# Patient Record
Sex: Female | Born: 1975 | Race: Black or African American | Hispanic: No | Smoking: Former smoker
Health system: Southern US, Community
[De-identification: ages and names within clinical notes are randomized; demographics above are authoritative.]

## PROBLEM LIST (undated history)

## (undated) DIAGNOSIS — G43909 Migraine, unspecified, not intractable, without status migrainosus: Secondary | ICD-10-CM

## (undated) DIAGNOSIS — Z9884 Bariatric surgery status: Secondary | ICD-10-CM

## (undated) DIAGNOSIS — F4310 Post-traumatic stress disorder, unspecified: Secondary | ICD-10-CM

## (undated) DIAGNOSIS — F419 Anxiety disorder, unspecified: Secondary | ICD-10-CM

## (undated) DIAGNOSIS — M47812 Spondylosis without myelopathy or radiculopathy, cervical region: Secondary | ICD-10-CM

## (undated) DIAGNOSIS — M4802 Spinal stenosis, cervical region: Secondary | ICD-10-CM

## (undated) DIAGNOSIS — E60 Dietary zinc deficiency: Secondary | ICD-10-CM

## (undated) DIAGNOSIS — F319 Bipolar disorder, unspecified: Secondary | ICD-10-CM

## (undated) DIAGNOSIS — F329 Major depressive disorder, single episode, unspecified: Secondary | ICD-10-CM

## (undated) DIAGNOSIS — D649 Anemia, unspecified: Secondary | ICD-10-CM

## (undated) DIAGNOSIS — Z9889 Other specified postprocedural states: Secondary | ICD-10-CM

## (undated) DIAGNOSIS — K649 Unspecified hemorrhoids: Secondary | ICD-10-CM

## (undated) DIAGNOSIS — G8929 Other chronic pain: Secondary | ICD-10-CM

## (undated) DIAGNOSIS — F32A Depression, unspecified: Secondary | ICD-10-CM

## (undated) DIAGNOSIS — K219 Gastro-esophageal reflux disease without esophagitis: Secondary | ICD-10-CM

## (undated) DIAGNOSIS — E560 Deficiency of vitamin E: Secondary | ICD-10-CM

## (undated) DIAGNOSIS — R112 Nausea with vomiting, unspecified: Secondary | ICD-10-CM

## (undated) DIAGNOSIS — J302 Other seasonal allergic rhinitis: Secondary | ICD-10-CM

## (undated) DIAGNOSIS — E538 Deficiency of other specified B group vitamins: Secondary | ICD-10-CM

## (undated) HISTORY — DX: Anxiety disorder, unspecified: F41.9

## (undated) HISTORY — PX: HAND SURGERY: SHX662

## (undated) HISTORY — DX: Nausea with vomiting, unspecified: R11.2

## (undated) HISTORY — DX: Post-traumatic stress disorder, unspecified: F43.10

## (undated) HISTORY — DX: Deficiency of vitamin E: E56.0

## (undated) HISTORY — DX: Migraine, unspecified, not intractable, without status migrainosus: G43.909

## (undated) HISTORY — PX: BREAST LUMPECTOMY: SHX2

## (undated) HISTORY — DX: Other specified postprocedural states: Z98.890

## (undated) HISTORY — DX: Spinal stenosis, cervical region: M48.02

## (undated) HISTORY — PX: ABDOMINAL HYSTERECTOMY: SHX81

## (undated) HISTORY — DX: Gastro-esophageal reflux disease without esophagitis: K21.9

## (undated) HISTORY — DX: Unspecified hemorrhoids: K64.9

## (undated) HISTORY — DX: Dietary zinc deficiency: E60

## (undated) HISTORY — PX: BREAST REDUCTION SURGERY: SHX8

## (undated) HISTORY — DX: Deficiency of other specified B group vitamins: E53.8

## (undated) HISTORY — DX: Anemia, unspecified: D64.9

## (undated) HISTORY — DX: Bipolar disorder, unspecified: F31.9

## (undated) HISTORY — DX: Other seasonal allergic rhinitis: J30.2

---
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
## 2001-12-08 DIAGNOSIS — Z9884 Bariatric surgery status: Secondary | ICD-10-CM

## 2001-12-08 HISTORY — PX: ROUX-EN-Y GASTRIC BYPASS: SHX1104

## 2001-12-08 HISTORY — DX: Bariatric surgery status: Z98.84

## 2005-12-05 ENCOUNTER — Inpatient Hospital Stay (HOSPITAL_COMMUNITY): Admission: AD | Admit: 2005-12-05 | Discharge: 2005-12-05 | Payer: Self-pay | Admitting: Family Medicine

## 2005-12-05 ENCOUNTER — Ambulatory Visit: Payer: Self-pay | Admitting: Certified Nurse Midwife

## 2006-11-15 ENCOUNTER — Emergency Department (HOSPITAL_COMMUNITY): Admission: EM | Admit: 2006-11-15 | Discharge: 2006-11-15 | Payer: Self-pay | Admitting: Emergency Medicine

## 2007-02-23 ENCOUNTER — Emergency Department (HOSPITAL_COMMUNITY): Admission: EM | Admit: 2007-02-23 | Discharge: 2007-02-23 | Payer: Self-pay | Admitting: Emergency Medicine

## 2008-03-04 ENCOUNTER — Emergency Department (HOSPITAL_COMMUNITY): Admission: EM | Admit: 2008-03-04 | Discharge: 2008-03-04 | Payer: Self-pay | Admitting: Emergency Medicine

## 2008-04-30 ENCOUNTER — Emergency Department (HOSPITAL_COMMUNITY): Admission: EM | Admit: 2008-04-30 | Discharge: 2008-05-01 | Payer: Self-pay | Admitting: Emergency Medicine

## 2008-09-29 ENCOUNTER — Emergency Department (HOSPITAL_COMMUNITY): Admission: EM | Admit: 2008-09-29 | Discharge: 2008-09-30 | Payer: Self-pay | Admitting: Emergency Medicine

## 2010-01-24 ENCOUNTER — Emergency Department (HOSPITAL_COMMUNITY): Admission: EM | Admit: 2010-01-24 | Discharge: 2010-01-24 | Payer: Self-pay | Admitting: Pediatric Emergency Medicine

## 2011-09-03 LAB — URINE MICROSCOPIC-ADD ON

## 2011-09-03 LAB — URINALYSIS, ROUTINE W REFLEX MICROSCOPIC
Bilirubin Urine: NEGATIVE
Glucose, UA: NEGATIVE
Hgb urine dipstick: NEGATIVE
Protein, ur: NEGATIVE
Specific Gravity, Urine: 1.015

## 2011-09-03 LAB — COMPREHENSIVE METABOLIC PANEL
ALT: 30
Albumin: 3.9
Alkaline Phosphatase: 48
CO2: 27
Calcium: 8.9
Chloride: 108
Creatinine, Ser: 0.66
Glucose, Bld: 95
Sodium: 139
Total Bilirubin: 0.6
Total Protein: 6.5

## 2011-09-03 LAB — DIFFERENTIAL
Basophils Absolute: 0.1
Eosinophils Absolute: 0.4
Lymphocytes Relative: 41
Monocytes Relative: 7
Neutrophils Relative %: 45

## 2011-09-03 LAB — CBC
MCV: 89.2
Platelets: 296
RDW: 17.6 — ABNORMAL HIGH
WBC: 6.8

## 2011-09-03 LAB — POCT PREGNANCY, URINE: Operator id: 261601

## 2012-05-06 ENCOUNTER — Other Ambulatory Visit (HOSPITAL_COMMUNITY)
Admission: RE | Admit: 2012-05-06 | Discharge: 2012-05-06 | Disposition: A | Discharge status: Home / Self Care | Payer: Medicare Other | Source: Ambulatory Visit | Attending: Obstetrics and Gynecology | Admitting: Obstetrics and Gynecology

## 2012-05-06 DIAGNOSIS — Z113 Encounter for screening for infections with a predominantly sexual mode of transmission: Secondary | ICD-10-CM | POA: Insufficient documentation

## 2012-05-06 DIAGNOSIS — Z124 Encounter for screening for malignant neoplasm of cervix: Secondary | ICD-10-CM | POA: Insufficient documentation

## 2014-04-19 DIAGNOSIS — D649 Anemia, unspecified: Secondary | ICD-10-CM | POA: Insufficient documentation

## 2015-01-13 ENCOUNTER — Other Ambulatory Visit: Payer: Self-pay

## 2015-01-13 ENCOUNTER — Emergency Department (HOSPITAL_COMMUNITY): Payer: Medicare Other

## 2015-01-13 ENCOUNTER — Encounter (HOSPITAL_COMMUNITY): Payer: Self-pay | Admitting: Emergency Medicine

## 2015-01-13 ENCOUNTER — Emergency Department (HOSPITAL_COMMUNITY)
Admission: EM | Admit: 2015-01-13 | Discharge: 2015-01-14 | Disposition: A | Discharge status: Home / Self Care | Payer: Medicare Other | Attending: Emergency Medicine | Admitting: Emergency Medicine

## 2015-01-13 DIAGNOSIS — Z3202 Encounter for pregnancy test, result negative: Secondary | ICD-10-CM | POA: Insufficient documentation

## 2015-01-13 DIAGNOSIS — R0602 Shortness of breath: Secondary | ICD-10-CM | POA: Diagnosis not present

## 2015-01-13 DIAGNOSIS — R079 Chest pain, unspecified: Secondary | ICD-10-CM | POA: Diagnosis not present

## 2015-01-13 DIAGNOSIS — Z720 Tobacco use: Secondary | ICD-10-CM | POA: Insufficient documentation

## 2015-01-13 LAB — CBC
HCT: 32.5 % — ABNORMAL LOW (ref 36.0–46.0)
Hemoglobin: 10.5 g/dL — ABNORMAL LOW (ref 12.0–15.0)
MCH: 26.9 pg (ref 26.0–34.0)
MCHC: 32.3 g/dL (ref 30.0–36.0)
MCV: 83.3 fL (ref 78.0–100.0)
PLATELETS: 312 10*3/uL (ref 150–400)
RBC: 3.9 MIL/uL (ref 3.87–5.11)
RDW: 14.7 % (ref 11.5–15.5)
WBC: 5.1 10*3/uL (ref 4.0–10.5)

## 2015-01-13 LAB — I-STAT TROPONIN, ED: TROPONIN I, POC: 0 ng/mL (ref 0.00–0.08)

## 2015-01-13 LAB — BASIC METABOLIC PANEL
Anion gap: 9 (ref 5–15)
BUN: 5 mg/dL — ABNORMAL LOW (ref 6–23)
CO2: 23 mmol/L (ref 19–32)
Calcium: 8.3 mg/dL — ABNORMAL LOW (ref 8.4–10.5)
Chloride: 106 mmol/L (ref 96–112)
Creatinine, Ser: 0.54 mg/dL (ref 0.50–1.10)
GFR calc non Af Amer: >90 mL/min (ref >90)
Glucose, Bld: 87 mg/dL (ref 70–99)
Potassium: 3.8 mmol/L (ref 3.5–5.1)
Sodium: 138 mmol/L (ref 135–145)

## 2015-01-13 LAB — POC URINE PREG, ED: PREG TEST UR: NEGATIVE

## 2015-01-13 MED ORDER — LORAZEPAM 2 MG/ML IJ SOLN
1.0000 mg | Freq: Once | INTRAMUSCULAR | Status: AC
  Administered 2015-01-13: 1 mg via INTRAVENOUS
  Filled 2015-01-13: qty 1

## 2015-01-13 NOTE — ED Notes (Signed)
MD at bedside. 

## 2015-01-13 NOTE — Discharge Instructions (Signed)

## 2015-01-13 NOTE — ED Notes (Signed)
Patient transported to X-ray 
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
## 2015-01-13 NOTE — ED Provider Notes (Signed)
Patient presented to the ER with chest pain and shortness of breath which began 20 minutes prior to arrival. Patient appears anxious. She has not history of cardiac disease.  Face to face Exam: HEENT - PERRLA Lungs - CTAB Heart - RRR, no M/R/G Abd - S/NT/ND Neuro - alert, oriented x3  Plan: Patient presents with chest pain and shortness of breath that is atypical in nature. There is no exertional component. She has minimal cardiac risk factors. PERC negative, unlikely to be PE. Patient has normal vital signs including no tachycardia, no hypoxia. Symptomatically treatment.   Gilda Creasehristopher J. Colm Lyford, MD 01/13/15 2325
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
## 2015-04-16 DIAGNOSIS — D573 Sickle-cell trait: Secondary | ICD-10-CM | POA: Insufficient documentation

## 2015-04-16 DIAGNOSIS — F419 Anxiety disorder, unspecified: Secondary | ICD-10-CM | POA: Insufficient documentation

## 2015-04-16 DIAGNOSIS — M4802 Spinal stenosis, cervical region: Secondary | ICD-10-CM | POA: Insufficient documentation

## 2015-07-12 DIAGNOSIS — F331 Major depressive disorder, recurrent, moderate: Secondary | ICD-10-CM | POA: Insufficient documentation

## 2016-07-08 DIAGNOSIS — M47812 Spondylosis without myelopathy or radiculopathy, cervical region: Secondary | ICD-10-CM

## 2016-07-08 HISTORY — DX: Spondylosis without myelopathy or radiculopathy, cervical region: M47.812

## 2016-10-11 ENCOUNTER — Inpatient Hospital Stay (HOSPITAL_COMMUNITY)
Admission: AD | Admit: 2016-10-11 | Discharge: 2016-10-21 | DRG: 885 | Disposition: A | Discharge status: Home / Self Care | Payer: Medicare Other | Source: Intra-hospital | Attending: Psychiatry | Admitting: Psychiatry

## 2016-10-11 ENCOUNTER — Encounter (HOSPITAL_COMMUNITY): Payer: Self-pay | Admitting: *Deleted

## 2016-10-11 ENCOUNTER — Emergency Department (HOSPITAL_COMMUNITY)
Admission: EM | Admit: 2016-10-11 | Discharge: 2016-10-11 | Disposition: A | Discharge status: Other Acute Inpatient Hospital | Payer: Medicare Other | Attending: Emergency Medicine | Admitting: Emergency Medicine

## 2016-10-11 ENCOUNTER — Encounter (HOSPITAL_COMMUNITY): Payer: Self-pay | Admitting: Nurse Practitioner

## 2016-10-11 DIAGNOSIS — F17210 Nicotine dependence, cigarettes, uncomplicated: Secondary | ICD-10-CM | POA: Insufficient documentation

## 2016-10-11 DIAGNOSIS — F064 Anxiety disorder due to known physiological condition: Secondary | ICD-10-CM | POA: Diagnosis present

## 2016-10-11 DIAGNOSIS — Z79899 Other long term (current) drug therapy: Secondary | ICD-10-CM | POA: Diagnosis not present

## 2016-10-11 DIAGNOSIS — F332 Major depressive disorder, recurrent severe without psychotic features: Secondary | ICD-10-CM | POA: Diagnosis present

## 2016-10-11 DIAGNOSIS — F329 Major depressive disorder, single episode, unspecified: Secondary | ICD-10-CM | POA: Diagnosis not present

## 2016-10-11 DIAGNOSIS — Z5181 Encounter for therapeutic drug level monitoring: Secondary | ICD-10-CM | POA: Insufficient documentation

## 2016-10-11 DIAGNOSIS — Z818 Family history of other mental and behavioral disorders: Secondary | ICD-10-CM

## 2016-10-11 DIAGNOSIS — R45851 Suicidal ideations: Secondary | ICD-10-CM | POA: Diagnosis present

## 2016-10-11 DIAGNOSIS — F3130 Bipolar disorder, current episode depressed, mild or moderate severity, unspecified: Secondary | ICD-10-CM | POA: Diagnosis present

## 2016-10-11 DIAGNOSIS — Z885 Allergy status to narcotic agent status: Secondary | ICD-10-CM | POA: Diagnosis not present

## 2016-10-11 DIAGNOSIS — F319 Bipolar disorder, unspecified: Principal | ICD-10-CM | POA: Diagnosis present

## 2016-10-11 DIAGNOSIS — Z91040 Latex allergy status: Secondary | ICD-10-CM | POA: Diagnosis not present

## 2016-10-11 DIAGNOSIS — G4700 Insomnia, unspecified: Secondary | ICD-10-CM | POA: Diagnosis present

## 2016-10-11 DIAGNOSIS — Y903 Blood alcohol level of 60-79 mg/100 ml: Secondary | ICD-10-CM | POA: Diagnosis present

## 2016-10-11 DIAGNOSIS — F32A Depression, unspecified: Secondary | ICD-10-CM

## 2016-10-11 DIAGNOSIS — D509 Iron deficiency anemia, unspecified: Secondary | ICD-10-CM | POA: Diagnosis present

## 2016-10-11 DIAGNOSIS — F1020 Alcohol dependence, uncomplicated: Secondary | ICD-10-CM | POA: Clinically undetermined

## 2016-10-11 DIAGNOSIS — E739 Lactose intolerance, unspecified: Secondary | ICD-10-CM | POA: Diagnosis present

## 2016-10-11 DIAGNOSIS — Z886 Allergy status to analgesic agent status: Secondary | ICD-10-CM | POA: Diagnosis not present

## 2016-10-11 HISTORY — DX: Depression, unspecified: F32.A

## 2016-10-11 HISTORY — DX: Major depressive disorder, single episode, unspecified: F32.9

## 2016-10-11 LAB — COMPREHENSIVE METABOLIC PANEL
ALT: 26 U/L (ref 14–54)
ANION GAP: 11 (ref 5–15)
AST: 54 U/L — AB (ref 15–41)
Albumin: 3.9 g/dL (ref 3.5–5.0)
Alkaline Phosphatase: 51 U/L (ref 38–126)
BILIRUBIN TOTAL: 0.3 mg/dL (ref 0.3–1.2)
CALCIUM: 8.7 mg/dL — AB (ref 8.9–10.3)
CHLORIDE: 107 mmol/L (ref 101–111)
CO2: 23 mmol/L (ref 22–32)
Creatinine, Ser: 0.57 mg/dL (ref 0.44–1.00)
Glucose, Bld: 89 mg/dL (ref 65–99)
POTASSIUM: 3.9 mmol/L (ref 3.5–5.1)
Sodium: 141 mmol/L (ref 135–145)
Total Protein: 7.3 g/dL (ref 6.5–8.1)

## 2016-10-11 LAB — SALICYLATE LEVEL: Salicylate Lvl: <7 mg/dL (ref 2.8–30.0)

## 2016-10-11 LAB — RAPID URINE DRUG SCREEN, HOSP PERFORMED
AMPHETAMINES: NOT DETECTED
BARBITURATES: NOT DETECTED
BENZODIAZEPINES: NOT DETECTED
Cocaine: NOT DETECTED
Opiates: NOT DETECTED
TETRAHYDROCANNABINOL: NOT DETECTED

## 2016-10-11 LAB — CBC
HEMATOCRIT: 32.9 % — AB (ref 36.0–46.0)
HEMOGLOBIN: 10.1 g/dL — AB (ref 12.0–15.0)
MCH: 23.1 pg — AB (ref 26.0–34.0)
MCHC: 30.7 g/dL (ref 30.0–36.0)
MCV: 75.1 fL — AB (ref 78.0–100.0)
PLATELETS: 357 10*3/uL (ref 150–400)
RBC: 4.38 MIL/uL (ref 3.87–5.11)
RDW: 17.2 % — ABNORMAL HIGH (ref 11.5–15.5)
WBC: 4.7 10*3/uL (ref 4.0–10.5)

## 2016-10-11 LAB — I-STAT BETA HCG BLOOD, ED (MC, WL, AP ONLY)

## 2016-10-11 LAB — ACETAMINOPHEN LEVEL: Acetaminophen (Tylenol), Serum: <10 ug/mL — ABNORMAL LOW (ref 10–30)

## 2016-10-11 LAB — ETHANOL: ALCOHOL ETHYL (B): 65 mg/dL — AB (ref <5)

## 2016-10-11 MED ORDER — LORAZEPAM 1 MG PO TABS
1.0000 mg | ORAL_TABLET | Freq: Four times a day (QID) | ORAL | Status: AC | PRN
  Administered 2016-10-11 – 2016-10-13 (×5): 1 mg via ORAL
  Filled 2016-10-11 (×5): qty 1

## 2016-10-11 MED ORDER — LORAZEPAM 1 MG PO TABS: 0.0000 mg | ORAL_TABLET | Freq: Four times a day (QID) | ORAL | Status: DC

## 2016-10-11 MED ORDER — TRAZODONE HCL 100 MG PO TABS
100.0000 mg | ORAL_TABLET | Freq: Every evening | ORAL | Status: DC | PRN
  Administered 2016-10-11 – 2016-10-12 (×2): 100 mg via ORAL
  Filled 2016-10-11 (×2): qty 1

## 2016-10-11 MED ORDER — ONDANSETRON HCL 4 MG PO TABS: 4.0000 mg | ORAL_TABLET | Freq: Three times a day (TID) | ORAL | Status: DC | PRN

## 2016-10-11 MED ORDER — HYDROXYZINE HCL 25 MG PO TABS
25.0000 mg | ORAL_TABLET | Freq: Four times a day (QID) | ORAL | Status: AC | PRN
  Administered 2016-10-11 – 2016-10-13 (×2): 25 mg via ORAL
  Filled 2016-10-11 (×3): qty 1

## 2016-10-11 MED ORDER — LORAZEPAM 1 MG PO TABS: 0.0000 mg | ORAL_TABLET | Freq: Two times a day (BID) | ORAL | Status: DC

## 2016-10-11 MED ORDER — ADULT MULTIVITAMIN W/MINERALS CH
1.0000 | ORAL_TABLET | Freq: Every day | ORAL | Status: DC
  Administered 2016-10-12 – 2016-10-21 (×10): 1 via ORAL
  Filled 2016-10-11 (×12): qty 1

## 2016-10-11 MED ORDER — LORAZEPAM 1 MG PO TABS
1.0000 mg | ORAL_TABLET | Freq: Three times a day (TID) | ORAL | Status: DC | PRN
  Administered 2016-10-11: 1 mg via ORAL
  Filled 2016-10-11 (×2): qty 1

## 2016-10-11 MED ORDER — VITAMIN B-1 100 MG PO TABS
100.0000 mg | ORAL_TABLET | Freq: Every day | ORAL | Status: DC
  Administered 2016-10-12 – 2016-10-21 (×10): 100 mg via ORAL
  Filled 2016-10-11 (×12): qty 1

## 2016-10-11 MED ORDER — MAGNESIUM HYDROXIDE 400 MG/5ML PO SUSP: 30.0000 mL | Freq: Every day | ORAL | Status: DC | PRN

## 2016-10-11 MED ORDER — TRAZODONE HCL 50 MG PO TABS: 50.0000 mg | ORAL_TABLET | Freq: Every evening | ORAL | Status: DC | PRN

## 2016-10-11 MED ORDER — ONDANSETRON 4 MG PO TBDP
4.0000 mg | ORAL_TABLET | Freq: Four times a day (QID) | ORAL | Status: AC | PRN
  Administered 2016-10-11 – 2016-10-13 (×2): 4 mg via ORAL
  Filled 2016-10-11 (×2): qty 1

## 2016-10-11 MED ORDER — NICOTINE 21 MG/24HR TD PT24
21.0000 mg | MEDICATED_PATCH | Freq: Every day | TRANSDERMAL | Status: DC
  Administered 2016-10-11: 21 mg via TRANSDERMAL
  Filled 2016-10-11: qty 1

## 2016-10-11 MED ORDER — IBUPROFEN 400 MG PO TABS: 600.0000 mg | ORAL_TABLET | Freq: Three times a day (TID) | ORAL | Status: DC | PRN

## 2016-10-11 MED ORDER — NICOTINE 21 MG/24HR TD PT24
21.0000 mg | MEDICATED_PATCH | Freq: Every day | TRANSDERMAL | Status: DC
Start: 2016-10-12 — End: 2016-10-21
  Administered 2016-10-12 – 2016-10-21 (×10): 21 mg via TRANSDERMAL
  Filled 2016-10-11 (×13): qty 1

## 2016-10-11 MED ORDER — ALUM & MAG HYDROXIDE-SIMETH 200-200-20 MG/5ML PO SUSP: 30.0000 mL | ORAL | Status: DC | PRN

## 2016-10-11 MED ORDER — LOPERAMIDE HCL 2 MG PO CAPS: 2.0000 mg | ORAL_CAPSULE | ORAL | Status: AC | PRN

## 2016-10-11 MED ORDER — ALUM & MAG HYDROXIDE-SIMETH 200-200-20 MG/5ML PO SUSP
30.0000 mL | ORAL | Status: DC | PRN
  Administered 2016-10-14: 30 mL via ORAL
  Filled 2016-10-11: qty 30

## 2016-10-11 NOTE — ED Notes (Signed)
ED Provider at bedside. 

## 2016-10-11 NOTE — ED Notes (Signed)
Pt belongings given to [facility] transporter Mellody DanceKeith. [facility] NAD. Calm and cooperative.
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
## 2016-10-11 NOTE — BH Assessment (Signed)
Contacted patients nurse to coordinate assessment. She states that she will place the patients room and recommended to call in about five minutes.   Davina PokeJoVea Gabrielle Wakeland, LCSW Therapeutic Triage Specialist [facility] 10/11/2016 2:27 PM

## 2016-10-11 NOTE — ED Triage Notes (Signed)
Pt presents with c/o depression. Her symptoms began after her son and daughter left her home over the past few weeks. She reports feeling empty and alone now and she thought about taking all of her pills so that she could go to sleep forever. She reports sweats, shakes, anxiety, nausea, anorexia. She denies pain, thoughts of harming others. She stopped taking her zoloft last week because she felt it was not working. She is alert and breathing easily.

## 2016-10-11 NOTE — Tx Team (Signed)
Initial Treatment Plan 10/11/2016 7:08 PM Girtha RmPaula M Sherk [id number]    PATIENT STRESSORS: Financial difficulties Marital or family conflict Substance abuse   PATIENT STRENGTHS: Ability for insight Average or above average intelligence Capable of independent living Communication skills   PATIENT IDENTIFIED PROBLEMS: "depression"  Treatment for Alcohol"  "learn new coping skills"  Suicidal Ideation  Substance Abuse             DISCHARGE CRITERIA:  Ability to meet basic life and health needs Adequate post-discharge living arrangements Motivation to continue treatment in a less acute level of care Verbal commitment to aftercare and medication compliance  PRELIMINARY DISCHARGE PLAN: Attend aftercare/continuing care group Outpatient therapy Return to previous living arrangement  PATIENT/FAMILY INVOLVEMENT: This treatment plan has been presented to and reviewed with the patient, Girtha Rmaula M Antonucci, and/or family member.  The patient and family have been given the opportunity to ask questions and make suggestions.  Mickie BailElizabeth O Iwenekha, RN 10/11/2016, 7:08 PM

## 2016-10-11 NOTE — Progress Notes (Signed)
Adult Psychoeducational Group Note  Date:  10/11/2016 Time:  8:56 PM  Group Topic/Focus:  Wrap-Up Group:   The focus of this group is to help patients review their daily goal of treatment and discuss progress on daily workbooks.   Participation Level:  Minimal  Participation Quality:  Appropriate  Affect:  Flat  Cognitive:  Alert  Insight: Lacking  Engagement in Group:  None  Modes of Intervention:  Discussion  Additional Comments:  Pt is new to the admit and stated that she just wants to stay safe that is why is came here. Pt is able to contract for safety. Kaleen OdeaCOOKE, Massimo Hartland R 10/11/2016, 8:56 PM

## 2016-10-11 NOTE — ED Provider Notes (Signed)
[facility]-EMERGENCY DEPT Provider Note   CSN: [id number] Arrival date & time: 10/11/16  1229     History   Chief Complaint Chief Complaint  Patient presents with  . Suicidal  . Depression    HPI Leslie Sanders is a 40 y.o. female.  HPI Leslie Sanders is a 40 y.o. female with history of depression, followed by psychiatry, presents to emergency department complaining of worsening depression and suicidal thoughts. Pt had a plan to overdose on pills. Patient states that she just had to give up her son to a foster family, states that her daughter moved out, patient believes that is what triggered her depression. She states that she feels lonely, she lives alone. She is followed by psychiatrist and therapist. States saw them last week, but did not tell him that she had worsening in her symptoms. She was on Zoloft but took herself off of Zoloft 2 weeks ago because she did not think it was helping. She reports history of anemia, denies any other medical problems. She denies any homicidal ideations. She states that her cousin whom she stayed with her yesterday made her come to the ED. She admits to drinking alcohol "almost daily." denies any other drugs.   Past Medical History:  Diagnosis Date  . Depression     There are no active problems to display for this patient.   History reviewed. No pertinent surgical history.  OB History    No data available       Home Medications    Prior to Admission medications   Not on File    Family History History reviewed. No pertinent family history.  Social History Social History  Substance Use Topics  . Smoking status: Current Every Day Smoker    Packs/day: 0.25    Types: Cigarettes  . Smokeless tobacco: Never Used  . Alcohol use Yes     Allergies   Tylenol [acetaminophen]   Review of Systems Review of Systems  Constitutional: Negative for chills and fever.  Respiratory: Negative for cough, chest tightness and shortness of breath.     Cardiovascular: Negative for chest pain, palpitations and leg swelling.  Gastrointestinal: Negative for abdominal pain, diarrhea, nausea and vomiting.  Genitourinary: Negative for dysuria, flank pain and pelvic pain.  Musculoskeletal: Negative for arthralgias, myalgias, neck pain and neck stiffness.  Skin: Negative for rash.  Neurological: Negative for dizziness, weakness and headaches.  Psychiatric/Behavioral: Positive for dysphoric mood and suicidal ideas. The patient is nervous/anxious.   All other systems reviewed and are negative.    Physical Exam Updated Vital Signs BP 136/89 (BP Location: Left Arm)   Pulse 110   Temp 98.6 F (37 C) (Oral)   Resp 20   SpO2 100%   Physical Exam  Constitutional: She appears well-developed and well-nourished.  HENT:  Head: Normocephalic.  Eyes: Conjunctivae are normal.  Neck: Neck supple.  Cardiovascular: Normal rate, regular rhythm and normal heart sounds.   Pulmonary/Chest: Effort normal and breath sounds normal. No respiratory distress. She has no wheezes. She has no rales.  Abdominal: Soft. Bowel sounds are normal. She exhibits no distension. There is no tenderness. There is no rebound.  Musculoskeletal: She exhibits no edema.  Neurological: She is alert.  Skin: Skin is warm and dry.  Psychiatric:  Patient is tearful, appears anxious  Nursing note and vitals reviewed.    ED Treatments / Results  Labs (all labs ordered are listed, but only abnormal results are displayed) Labs Reviewed  COMPREHENSIVE METABOLIC  PANEL - Abnormal; Notable for the following:       Result Value   BUN <5 (*)    Calcium 8.7 (*)    AST 54 (*)    All other components within normal limits  ETHANOL - Abnormal; Notable for the following:    Alcohol, Ethyl (B) 65 (*)    All other components within normal limits  ACETAMINOPHEN LEVEL - Abnormal; Notable for the following:    Acetaminophen (Tylenol), Serum <10 (*)    All other components within normal  limits  CBC - Abnormal; Notable for the following:    Hemoglobin 10.1 (*)    HCT 32.9 (*)    MCV 75.1 (*)    MCH 23.1 (*)    RDW 17.2 (*)    All other components within normal limits  SALICYLATE LEVEL  RAPID URINE DRUG SCREEN, HOSP PERFORMED  I-STAT BETA HCG BLOOD, ED ([facility], [facility], [facility] ONLY)    EKG  EKG Interpretation None       Radiology No results found.  Procedures Procedures (including critical care time)  Medications Ordered in ED Medications  LORazepam (ATIVAN) tablet 0-4 mg (not administered)    Followed by  LORazepam (ATIVAN) tablet 0-4 mg (not administered)  ibuprofen (ADVIL,MOTRIN) tablet 600 mg (not administered)  nicotine (NICODERM CQ - dosed in mg/24 hours) patch 21 mg (not administered)  ondansetron (ZOFRAN) tablet 4 mg (not administered)  alum & mag hydroxide-simeth (MAALOX/MYLANTA) 200-200-20 MG/5ML suspension 30 mL (not administered)  LORazepam (ATIVAN) tablet 1 mg (not administered)     Initial Impression / Assessment and Plan / ED Course  I have reviewed the triage vital signs and the nursing notes.  Pertinent labs & imaging results that were available during my care of the patient were reviewed by me and considered in my medical decision making (see chart for details).  Clinical Course    Patient seen and examined. Patient was worsening depression, suicidal thoughts. She reports these thoughts have been there for more than a week. She took herself off of Zoloft. She is currently tearful. Will check medical clearance labs, holding orders placed. Will get TTS assessment.  2:09 PM  Pt medically cleared. Pt is voluntary. Pending TTS assessment.   3:44 PM Pt assessed. Accepted to [facility]. Will transfer.   Vitals:   10/11/16 1233 10/11/16 1403 10/11/16 1443 10/11/16 1448  BP: 136/89  119/82 119/82  Pulse: 110  95 113  Resp: 20   19  Temp: 98.6 F (37 C) 98.7 F (37.1 C)    TempSrc: Oral Oral    SpO2: 100%   100%     Final Clinical Impressions(s)  / ED Diagnoses   Final diagnoses:  Depression, unspecified depression type  Suicidal ideations    New Prescriptions New Prescriptions   No medications on file     Jaynie Crumbleatyana Ronette Hank, PA-C 10/11/16 1410    [address] Saw[address], PA-C 10/11/16 1544    Loren Raceravid Yelverton, MD 10/13/16 [phone]
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
## 2016-10-11 NOTE — BH Assessment (Signed)
Spoke with provider to request machine.   Leslie PokeJoVea Jose Alleyne, LCSW Therapeutic Triage Specialist [facility] 10/11/2016 2:55 PM

## 2016-10-11 NOTE — ED Notes (Signed)
[facility] x2 [facility].

## 2016-10-11 NOTE — BH Assessment (Signed)
Called machine - PEDs answers.   Called back [phone] - no answer.   Davina PokeJoVea Jerome Viglione, LCSW Therapeutic Triage Specialist [facility] 10/11/2016 2:42 PM

## 2016-10-11 NOTE — BH Assessment (Signed)
Informed nurse of patients disposition and requested patient sign herself in. Patients nurse states that she will have the patient sign and fax to [phone]. Nurse is in agreement.   Davina PokeJoVea Cordelia Bessinger, LCSW Therapeutic Triage Specialist [facility] 10/11/2016 3:38 PM

## 2016-10-11 NOTE — Progress Notes (Signed)
Admission Note: Patient is a 40 year old female admitted to the unit with increased depression and suicidal ideation.  Patient contracts for safety.  Patient is alert and oriented x 4.  Patient speech is low and soft spoken.  Patient presents with flat affect and depressed mood.  Admission packet and treatment plan of care reviewed with patient.  Consent for treatment signed.  Skin assessment and personal belonging searched for contraband.  Skin is dry and intact.  No contraband found.  Patient oriented to the unit, staff and room.  Routine safety checks initiated.  Patient offered support and encouragement.  Patient states she is here to work on her depression, receive treatment for her drinking and to learn new coping skills to deal with her issues.

## 2016-10-11 NOTE — ED Notes (Signed)
Pt given apple juice per Pete GlatterHolley, RN; Relieving sitter at bedside, report given

## 2016-10-11 NOTE — BH Assessment (Signed)
Consulted with Fransisca KaufmannLaura Davis, NP who recommends inpatient treatment.   Davina PokeJoVea Iniko Robles, LCSW Therapeutic Triage Specialist [facility] 10/11/2016 3:33 PM

## 2016-10-12 ENCOUNTER — Encounter (HOSPITAL_COMMUNITY): Payer: Self-pay | Admitting: Psychiatry

## 2016-10-12 DIAGNOSIS — Z888 Allergy status to other drugs, medicaments and biological substances status: Secondary | ICD-10-CM

## 2016-10-12 DIAGNOSIS — F1020 Alcohol dependence, uncomplicated: Secondary | ICD-10-CM | POA: Clinically undetermined

## 2016-10-12 DIAGNOSIS — E739 Lactose intolerance, unspecified: Secondary | ICD-10-CM | POA: Diagnosis present

## 2016-10-12 DIAGNOSIS — R45851 Suicidal ideations: Secondary | ICD-10-CM

## 2016-10-12 DIAGNOSIS — Z91040 Latex allergy status: Secondary | ICD-10-CM

## 2016-10-12 DIAGNOSIS — D509 Iron deficiency anemia, unspecified: Secondary | ICD-10-CM | POA: Diagnosis present

## 2016-10-12 DIAGNOSIS — F3130 Bipolar disorder, current episode depressed, mild or moderate severity, unspecified: Secondary | ICD-10-CM | POA: Diagnosis present

## 2016-10-12 MED ORDER — CITALOPRAM HYDROBROMIDE 10 MG PO TABS
10.0000 mg | ORAL_TABLET | Freq: Every day | ORAL | Status: DC
  Administered 2016-10-12 – 2016-10-14 (×3): 10 mg via ORAL
  Filled 2016-10-12 (×5): qty 1

## 2016-10-12 MED ORDER — FERROUS SULFATE 325 (65 FE) MG PO TABS
325.0000 mg | ORAL_TABLET | Freq: Every day | ORAL | Status: DC
  Administered 2016-10-12 – 2016-10-21 (×10): 325 mg via ORAL
  Filled 2016-10-12 (×13): qty 1

## 2016-10-12 MED ORDER — DOCUSATE SODIUM 100 MG PO CAPS
100.0000 mg | ORAL_CAPSULE | Freq: Every day | ORAL | Status: DC
  Administered 2016-10-12 – 2016-10-21 (×10): 100 mg via ORAL
  Filled 2016-10-12 (×13): qty 1

## 2016-10-12 MED ORDER — LAMOTRIGINE 25 MG PO TABS
25.0000 mg | ORAL_TABLET | Freq: Every day | ORAL | Status: DC
  Administered 2016-10-12 – 2016-10-15 (×4): 25 mg via ORAL
  Filled 2016-10-12 (×7): qty 1

## 2016-10-12 MED ORDER — ENSURE ENLIVE PO LIQD
237.0000 mL | Freq: Two times a day (BID) | ORAL | Status: DC
  Administered 2016-10-12 – 2016-10-19 (×5): 237 mL via ORAL

## 2016-10-12 NOTE — Progress Notes (Signed)
Patient's pulse remains elevated. Encouraged to push fluids as patient states she has not been drinking and only eating very small amounts. Also encouraged to take in Ensures per order. Patient experiencing withdrawal symptoms - irritability, tremors, shakiness, chills, anxiety. BP stable, CIWA is a "7." Ativan last given at 1526. No pain, NAD. Will continue to monitor closely.
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
## 2016-10-12 NOTE — Progress Notes (Signed)
Patient ID: Leslie Sanders, female   DOB: [birth year], 40 y.o.   MRN: [id number] Per State regulations 482.30 this chart was reviewed for medical necessity with respect to the patient's admission/duration of stay.    Next review date: 10/15/16  Thurman CoyerEric Inez Stantz, BSN, RN-BC  Case Manager

## 2016-10-12 NOTE — Progress Notes (Signed)
D: Patient isolative room, refusing groups. Spoke with patient 1:1. Rates sleep as fair, appetite as poor, energy as low and concentration as poor. Patient's affect flat, sad with depressed mood. Rating depression at an 8/10, hopelessness at a 7/10 and anxiety at an 8/10. States goal for today is to "want to get motivated and not go back to bed." Denies pain however reports the following withdrawal symptoms - anxiety, chills, tremors (which are observed).   A: Medicated per orders, ativan prn given along with fluids which are encouraged. Emotional support offered and self inventory reviewed. Encouraged completion of Suicide Safety Plan and prompted patient to attend programming. Discussed POC with MD, SW.    R: Patient verbalizes understanding of POC. On reassess, patient is asleep. Patient endorsing passive SI but verbally contracts for safety. Denies plan, intent. No HI and remains safe on level III obs.

## 2016-10-12 NOTE — [facility] Group Notes (Signed)
[facility] Group Notes:  (Nursing/MHT/Case Management/Adjunct)  Date:  10/12/2016  Time:  0930  Type of Therapy:  Nurse Education - Healthy support systems  Participation Level:  Did Not Attend  Participation Quality:    Affect:    Cognitive:    Insight:    Engagement in Group:    Modes of Intervention:    Summary of Progress/Problems: Patient invited to attend however elected to remain in bed.  Merian CapronFriedman, Jaksen Fiorella [facility] 10/12/2016, 12:08 PM

## 2016-10-12 NOTE — [facility] Group Notes (Signed)
[facility] Group Notes: (Clinical Social Work)   10/12/2016      Type of Therapy:  Group Therapy   Participation Level:  Did Not Attend despite MHT prompting   Doc Mandala Grossman-Orr, LCSW 10/12/2016, 12:29 PM     

## 2016-10-12 NOTE — [facility] Suicide Risk Assessment (Signed)
[facility] INPATIENT:  Family/Significant Other Suicide Prevention Education  Suicide Prevention Education:  Patient Refusal for Family/Significant Other Suicide Prevention Education: The patient Leslie Sanders has refused to provide written consent for family/significant other to be provided Family/Significant Other Suicide Prevention Education during admission and/or prior to discharge.  Physician notified.  SPE brochure was reviewed with patient and left with her.  Carloyn JaegerMareida J Grossman-Orr 10/12/2016, 4:34 PM

## 2016-10-12 NOTE — Progress Notes (Signed)
[facility] Group Notes:  (Nursing/MHT/Case Management/Adjunct)  Date:  10/12/2016  Time:  11:10 PM  Type of Therapy:  Psychoeducational Skills  Participation Level:  Active  Participation Quality:  Monopolizing  Affect:  Irritable  Cognitive:  Lacking  Insight:  Lacking  Engagement in Group:  Monopolizing  Modes of Intervention:  Education  Summary of Progress/Problems: The patient stated in group that her day was a "nervous wreck" overall. She had multiple complaints about her day including her displeasure with the meals stating that their was a poor selection of food. She also complained about the lack of choices when it comes to fluids. Nothing else was shared about her day. In terms of the theme for the day, she does not presently have a support system in place.   Francia Verry S 10/12/2016, 11:10 PM

## 2016-10-12 NOTE — [facility] Suicide Risk Assessment (Signed)
[facility]   Nursing information obtained from:    Demographic factors:    Current Mental Status:    Loss Factors:    Historical Factors:    Risk Reduction Factors:     Total Time spent with patient: 30 minutes Principal Problem: Bipolar disorder, most recent episode depressed (HCC) Diagnosis:   Patient Active Problem List   Diagnosis Date Noted  . Bipolar disorder, most recent episode depressed (HCC) [F31.30] 10/12/2016  . Anemia, iron deficiency [D50.9] 10/12/2016  . Alcohol use disorder, moderate, dependence (HCC) [F10.20] 10/12/2016  . Lactose intolerance [E73.9] 10/12/2016   Subjective Data: Patient presents depressed, withdrawn, lethargic , has alcohol withdrawal sx like crawling sensation , nausea, and so on. Pt feels hopeless , sad .Pt with several stressors - financial , her son is in foster care due to having behavioral problems ( so she reports) , she has several medical issues like bipolar do, anemia and so on.  Continued Clinical Symptoms:  Alcohol Use Disorder Identification Test Final Score (AUDIT): 25 The "Alcohol Use Disorders Identification Test", Guidelines for Use in Primary Care, Second Edition.  World Science writerHealth Organization [facility](WHO). Score between 0-7:  no or low risk or alcohol related problems. Score between 8-15:  moderate risk of alcohol related problems. Score between 16-19:  high risk of alcohol related problems. Score 20 or above:  warrants further diagnostic evaluation for alcohol dependence and treatment.   CLINICAL FACTORS:   Bipolar Disorder:   Depressive phase Alcohol/Substance Abuse/Dependencies Unstable or Poor Therapeutic Relationship Previous Psychiatric Diagnoses and Treatments   Musculoskeletal: Strength & Muscle Tone: within normal limits Gait & Station: seen in bed Patient leans: N/A  Psychiatric Specialty Exam: Physical Exam  Nursing note and vitals reviewed.   Review of Systems  Psychiatric/Behavioral:  Positive for depression and substance abuse. The patient is nervous/anxious and has insomnia.   All other systems reviewed and are negative.   Blood pressure 102/71, pulse (!) 114, temperature 97 F (36.1 C), temperature source Oral, resp. rate 16, height 5' (1.524 m), weight 51.3 kg (113 lb), last menstrual period 10/07/2016, SpO2 100 %.Body mass index is 22.07 kg/m.  General Appearance: Guarded  Eye Contact:  Minimal  Speech:  Slow  Volume:  Decreased  Mood:  Anxious, Depressed and Dysphoric  Affect:  Restricted  Thought Process:  Goal Directed and Descriptions of Associations: Circumstantial  Orientation:  Full (Time, Place, and Person)  Thought Content:  Rumination  Suicidal Thoughts:  SI with plan - continues to have it on and off  Homicidal Thoughts:  No  Memory:  Immediate;   Fair Recent;   Fair Remote;   Fair  Judgement:  Impaired  Insight:  Fair  Psychomotor Activity:  Decreased  Concentration:  Concentration: Poor and Attention Span: Fair  Recall:  [employer]Fair  Fund of Knowledge:  Fair  Language:  Fair  Akathisia:  No  Handed:  Right  AIMS (if indicated):     Assets:  Desire for Improvement  ADL's:  Intact  Cognition:  WNL  Sleep:  Number of Hours: 7      COGNITIVE FEATURES THAT CONTRIBUTE TO RISK:  Closed-mindedness, Polarized thinking and Thought constriction (tunnel vision)    SUICIDE RISK:   Moderate:  Frequent suicidal ideation with limited intensity, and duration, some specificity in terms of plans, no associated intent, good self-control, limited dysphoria/symptomatology, some risk factors present, and identifiable protective factors, including available and accessible social support.   PLAN OF CARE: Will start  CIWA protocol for alcoholism , address mood sx - will consider a mood stabilizer . Will add Trazodone for sleep. Will address her anemia and order further labs. Case discussed with Mr. Christen ButterJohn W Conrad NP . please also see H&P. I certify that inpatient  services furnished can reasonably be expected to improve the patient's condition.  Kadeisha Betsch, MD 10/12/2016, 11:09 AM

## 2016-10-13 DIAGNOSIS — D509 Iron deficiency anemia, unspecified: Secondary | ICD-10-CM

## 2016-10-13 DIAGNOSIS — F1020 Alcohol dependence, uncomplicated: Secondary | ICD-10-CM

## 2016-10-13 DIAGNOSIS — E739 Lactose intolerance, unspecified: Secondary | ICD-10-CM

## 2016-10-13 LAB — IRON AND TIBC
Iron: 44 ug/dL (ref 28–170)
SATURATION RATIOS: 7 % — AB (ref 10.4–31.8)
TIBC: 610 ug/dL — ABNORMAL HIGH (ref 250–450)
UIBC: 566 ug/dL

## 2016-10-13 MED ORDER — OLANZAPINE 5 MG PO TABS
5.0000 mg | ORAL_TABLET | Freq: Every day | ORAL | Status: DC
  Administered 2016-10-13 – 2016-10-15 (×3): 5 mg via ORAL
  Filled 2016-10-13 (×4): qty 1

## 2016-10-13 NOTE — Progress Notes (Signed)
[facility] Group Notes:  (Nursing/MHT/Case Management/Adjunct)  Date:  10/13/2016  Time:  11:12 PM  Type of Therapy:  Psychoeducational Skills  Participation Level:  Active  Participation Quality:  Appropriate  Affect:  Flat and Irritable  Cognitive:  Appropriate  Insight:  Limited  Engagement in Group:  Engaged  Modes of Intervention:  Education  Summary of Progress/Problems: The patient mentioned many of the same issues that she spoke of last evening. She states that she had a "horrible" day since the dayroom is too cold for her. She also states that she continues to deal with anemia and is drinking plenty of fluids. As for the theme of the day, her wellness strategy will be to stay calm and continue to drink fluids to raise her blood levels.   Justo Hengel S 10/13/2016, 11:12 PM

## 2016-10-13 NOTE — Progress Notes (Signed)
Writer spoke with patient 1:1 before wrap up group and observed her sitting in the dayroom wrapped in a blanket. She reports her day has been rough and her appetite has not been good. She requested warm tea to drink to soothe her stomach which she reported helped earlier today. She was in formed of medications available if needed for anxiety and sleep. Support given and safety maintained on unit with 15 min checks.

## 2016-10-13 NOTE — Progress Notes (Signed)
NUTRITION ASSESSMENT  Pt identified as at risk on the Malnutrition Screen Tool  INTERVENTION: 1. Educated patient on the importance of nutrition and encouraged intake of food and beverages. 2. Discussed weight goals. 3. Supplements: continue Ensure Enlive po BID, each supplement provides 350 kcal and 20 grams of protein   NUTRITION DIAGNOSIS: Unintentional weight loss related to sub-optimal intake as evidenced by pt report.   Goal: Pt to meet >/= 90% of their estimated nutrition needs.  Monitor:  PO intake  Assessment:  Pt admitted for depression and alcohol abuse. She states that she has mainly been in bed for the 2 weeks PTA d/t depression. Associated with this, she has been eating very little and drinking very little fluids, other than beer several days a week.   Ensure Shiela Mayernlive is currently ordered BID. No recent weight hx available in the chart.   40 y.o. female  Height: Ht Readings from Last 1 Encounters:  10/11/16 5' (1.524 m)    Weight: Wt Readings from Last 1 Encounters:  10/11/16 113 lb (51.3 kg)    Weight Hx: Wt Readings from Last 10 Encounters:  10/11/16 113 lb (51.3 kg)  01/13/15 130 lb (59 kg)    BMI:  Body mass index is 22.07 kg/m. Pt meets criteria for normal weight based on current BMI.  Estimated Nutritional Needs: Kcal: 25-30 kcal/kg Protein: > 1 gram protein/kg Fluid: 1 ml/kcal  Diet Order: Diet regular Room service appropriate? Yes; Fluid consistency: Thin Pt is also offered choice of unit snacks mid-morning and mid-afternoon.  Pt is eating as desired.   Lab results and medications reviewed.     Trenton GammonJessica Tanis Hensarling, MS, RD, LDN Inpatient Clinical Dietitian Pager # [phone] After hours/weekend pager # [phone]

## 2016-10-13 NOTE — Progress Notes (Signed)
D: Patient reports ongoing depression, hopelessness and anxiety.  She is sleeping fair; energy level is low; concentration is good and appetite remains poor.  Encouraged patient to drink and ensure, however, she refused.  Patient has a lot of family stressors that is causing her to feel overwhelmed.  Patient has flat, blunted affect; her mood is irritable.  She is isolative to her room, however, she has attended some groups today.  She denies any thoughts of self harm; AVH/HI.  Her goal is to work on "my motivation to go on living knowing that things are not ell and I feel alone with no support."  She reports withdrawal symptoms of tremors, runny nose, chilling, cramping, nausea and irritability.   A: Continue to monitor medication management and MD orders.  Safety checks completed every 15 minutes per protocol.  Offer support and encouragement as needed. R: Patient is receptive to staff; her behavior is appropriate.

## 2016-10-13 NOTE — [facility] Group Notes (Signed)
[facility] LCSW Group Therapy  10/13/2016 1:15pm  Type of Therapy:  Group Therapy vercoming Obstacles  Participation Level:  Reserved  Participation Quality:  Appropriate   Affect:  Flat  Cognitive:  Appropriate and Oriented  Insight:  Developing/Improving  Engagement in Therapy:  Improving  Modes of Intervention:  Discussion, Exploration, Problem-solving and Support  Description of Group:   In this group patients will be encouraged to explore what they see as obstacles to their own wellness and recovery. They will be guided to discuss their thoughts, feelings, and behaviors related to these obstacles. The group will process together ways to cope with barriers, with attention given to specific choices patients can make. Each patient will be challenged to identify changes they are motivated to make in order to overcome their obstacles. This group will be process-oriented, with patients participating in exploration of their own experiences as well as giving and receiving support and challenge from other group members.  Summary of Patient Progress: Pt was minimal in group discussion but was attentive and engaged with peers.    Therapeutic Modalities:   Cognitive Behavioral Therapy Solution Focused Therapy Motivational Interviewing Relapse Prevention Therapy   Leslie ShanksLauren Prudie Guthridge, LCSW 10/13/2016 3:10 PM

## 2016-10-13 NOTE — Plan of Care (Signed)
Problem: Coping: Goal: Ability to cope will improve Outcome: Not Progressing Pt continued to express Sx of depression at this time
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
## 2016-10-13 NOTE — Tx Team (Signed)
Interdisciplinary Treatment and Diagnostic Plan Update  10/13/2016 Time of Session: 9:30 AM  Leslie Sanders MRN: [id number]  Principal Diagnosis: Bipolar disorder, most recent episode depressed (Leslie Sanders)  Secondary Diagnoses: Principal Problem:   Bipolar disorder, most recent episode depressed (Leslie Sanders) Active Problems:   Anemia, iron deficiency   Alcohol use disorder, moderate, dependence (HCC)   Lactose intolerance   Current Medications:  Current Facility-Administered Medications  Medication Dose Route Frequency Provider Last Rate Last Dose  . alum & mag hydroxide-simeth (MAALOX/MYLANTA) 200-200-20 MG/5ML suspension 30 mL  30 mL Oral Q4H PRN Niel Hummer, NP      . citalopram (CELEXA) tablet 10 mg  10 mg Oral Daily Benjamine Mola, FNP   10 mg at 10/13/16 0807  . docusate sodium (COLACE) capsule 100 mg  100 mg Oral Daily Benjamine Mola, FNP   100 mg at 10/13/16 6073  . feeding supplement (ENSURE ENLIVE) (ENSURE ENLIVE) liquid 237 mL  237 mL Oral BID BM Saramma Eappen, MD   237 mL at 10/12/16 1009  . ferrous sulfate tablet 325 mg  325 mg Oral Q breakfast Benjamine Mola, FNP   325 mg at 10/13/16 7106  . hydrOXYzine (ATARAX/VISTARIL) tablet 25 mg  25 mg Oral Q6H PRN Rozetta Nunnery, NP   25 mg at 10/11/16 2231  . lamoTRIgine (LAMICTAL) tablet 25 mg  25 mg Oral Daily Benjamine Mola, FNP   25 mg at 10/13/16 2694  . loperamide (IMODIUM) capsule 2-4 mg  2-4 mg Oral PRN Rozetta Nunnery, NP      . LORazepam (ATIVAN) tablet 1 mg  1 mg Oral Q6H PRN Rozetta Nunnery, NP   1 mg at 10/12/16 2248  . magnesium hydroxide (MILK OF MAGNESIA) suspension 30 mL  30 mL Oral Daily PRN Niel Hummer, NP      . multivitamin with minerals tablet 1 tablet  1 tablet Oral Daily Rozetta Nunnery, NP   1 tablet at 10/13/16 0807  . nicotine (NICODERM CQ - dosed in mg/24 hours) patch 21 mg  21 mg Transdermal Daily Jenne Campus, MD   21 mg at 10/13/16 0810  . OLANZapine (ZYPREXA) tablet 5 mg  5 mg Oral QHS Myer Peer Cobos, MD       . ondansetron (ZOFRAN-ODT) disintegrating tablet 4 mg  4 mg Oral Q6H PRN Rozetta Nunnery, NP   4 mg at 10/11/16 2118  . thiamine (VITAMIN B-1) tablet 100 mg  100 mg Oral Daily Rozetta Nunnery, NP   100 mg at 10/13/16 0807    PTA Medications: Prescriptions Prior to Admission  Medication Sig Dispense Refill Last Dose  . Carboxymethylcellul-Glycerin (CLEAR EYES FOR DRY EYES) 1-0.25 % SOLN Place 1 drop into both eyes daily as needed (dry eyes).   Past Week at Unknown time  . Cyanocobalamin (B-12) 2500 MCG TABS Take 2,500 mcg by mouth daily.  11 Past Week at Unknown time  . folic acid (FOLVITE) 1 MG tablet Take 1 mg by mouth daily.  2 Past Week at Unknown time  . sertraline (ZOLOFT) 50 MG tablet Take 50 mg by mouth daily.   09/27/2016  . traZODone (DESYREL) 100 MG tablet Take 100 mg by mouth at bedtime as needed.   Past Week at Unknown time    Treatment Modalities: Medication Management, Group therapy, Case management,  1 to 1 session with clinician, Psychoeducation, Recreational therapy.  Patient Stressors: Financial difficulties Marital or family conflict Substance abuse  Patient Strengths: Ability for insight Average or above average intelligence Capable of independent living Communication skills  Physician Treatment Plan for Primary Diagnosis: Bipolar disorder, most recent episode depressed ([place]) Long Term Goal(s): Improvement in symptoms so as ready for discharge  Short Term Goals: Ability to identify changes in lifestyle to reduce recurrence of condition will improve Ability to verbalize feelings will improve Ability to disclose and discuss suicidal ideas Ability to demonstrate self-control will improve Ability to identify and develop effective coping behaviors will improve Ability to maintain clinical measurements within normal limits will improve Compliance with prescribed medications will improve Ability to identify triggers associated with substance abuse/mental health issues  will improve Ability to identify changes in lifestyle to reduce recurrence of condition will improve Ability to verbalize feelings will improve Ability to disclose and discuss suicidal ideas Ability to demonstrate self-control will improve Ability to identify and develop effective coping behaviors will improve Ability to maintain clinical measurements within normal limits will improve Compliance with prescribed medications will improve Ability to identify triggers associated with substance abuse/mental health issues will improve  Medication Management: Evaluate patient's response, side effects, and tolerance of medication regimen.  Therapeutic Interventions: 1 to 1 sessions, Unit Group sessions and Medication administration.  Evaluation of Outcomes: Not Met  Physician Treatment Plan for Secondary Diagnosis: Principal Problem:   Bipolar disorder, most recent episode depressed (Leslie Sanders) Active Problems:   Anemia, iron deficiency   Alcohol use disorder, moderate, dependence (HCC)   Lactose intolerance   Long Term Goal(s): Improvement in symptoms so as ready for discharge  Short Term Goals: Ability to identify changes in lifestyle to reduce recurrence of condition will improve Ability to verbalize feelings will improve Ability to disclose and discuss suicidal ideas Ability to demonstrate self-control will improve Ability to identify and develop effective coping behaviors will improve Ability to maintain clinical measurements within normal limits will improve Compliance with prescribed medications will improve Ability to identify triggers associated with substance abuse/mental health issues will improve Ability to identify changes in lifestyle to reduce recurrence of condition will improve Ability to verbalize feelings will improve Ability to disclose and discuss suicidal ideas Ability to demonstrate self-control will improve Ability to identify and develop effective coping behaviors  will improve Ability to maintain clinical measurements within normal limits will improve Compliance with prescribed medications will improve Ability to identify triggers associated with substance abuse/mental health issues will improve  Medication Management: Evaluate patient's response, side effects, and tolerance of medication regimen.  Therapeutic Interventions: 1 to 1 sessions, Unit Group sessions and Medication administration.  Evaluation of Outcomes: Not Met   RN Treatment Plan for Primary Diagnosis: Bipolar disorder, most recent episode depressed ([place]) Long Term Goal(s): Knowledge of disease and therapeutic regimen to maintain health will improve  Short Term Goals: Ability to verbalize feelings will improve, Ability to disclose and discuss suicidal ideas and Ability to identify and develop effective coping behaviors will improve  Medication Management: RN will administer medications as ordered by provider, will assess and evaluate patient's response and provide education to patient for prescribed medication. RN will report any adverse and/or side effects to prescribing provider.  Therapeutic Interventions: 1 on 1 counseling sessions, Psychoeducation, Medication administration, Evaluate responses to treatment, Monitor vital signs and CBGs as ordered, Perform/monitor CIWA, COWS, AIMS and Fall Risk screenings as ordered, Perform wound care treatments as ordered.  Evaluation of Outcomes: Not Met   LCSW Treatment Plan for Primary Diagnosis: Bipolar disorder, most recent episode depressed ([place]) Long Term Goal(s):  Safe transition to appropriate next level of care at discharge, Engage patient in therapeutic group addressing interpersonal concerns.  Short Term Goals: Engage patient in aftercare planning with referrals and resources, Identify triggers associated with mental health/substance abuse issues and Increase skills for wellness and recovery  Therapeutic Interventions: Assess for all  discharge needs, 1 to 1 time with Social worker, Explore available resources and support systems, Assess for adequacy in community support network, Educate family and significant other(s) on suicide prevention, Complete Psychosocial Assessment, Interpersonal group therapy.  Evaluation of Outcomes: Not Met   Progress in Treatment: Attending groups: No Participating in groups: No Taking medication as prescribed: Yes, MD continues to assess for medication changes as needed Toleration medication: Yes, no side effects reported at this time Family/Significant other contact made: No, Pt declines Patient understands diagnosis: Continuing to assess Discussing patient identified problems/goals with staff: Yes Medical problems stabilized or resolved: Yes Denies suicidal/homicidal ideation: Yes Issues/concerns per patient self-inventory: None Other: N/A  New problem(s) identified: None identified at this time.   New Short Term/Long Term Goal(s): None identified at this time.   Discharge Plan or Barriers: Pt will return home and follow-up with outpatient services. However she is considering [facility]  Reason for Continuation of Hospitalization: Anxiety Depression Medication stabilization Suicidal ideation Withdrawal symptoms  Estimated Length of Stay: 3-5 days  Attendees: Patient: 10/13/2016  9:30 AM  Physician: Dr. Parke Poisson 10/13/2016  9:30 AM  Nursing: Verne Grain., RN 10/13/2016  9:30 AM  RN Care Manager: Lars Pinks, RN 10/13/2016  9:30 AM  Social Worker: Adriana Reams, LCSW; Erasmo Downer Drinkard, LCSW 10/13/2016  9:30 AM  Recreational Therapist:  10/13/2016  9:30 AM  Other: Samuel Jester, NP 10/13/2016  9:30 AM  Other:  10/13/2016  9:30 AM  Other: 10/13/2016  9:30 AM    Scribe for Treatment Team: Gladstone Lighter, LCSW 10/13/2016 9:30 AM

## 2016-10-13 NOTE — Progress Notes (Signed)
Recreation Therapy Notes  Date: 10/13/16 Time: 0930 Location: [address]   Group Topic: Stress Management  Goal Area(s) Addresses:  Patient will verbalize importance of using healthy stress management.  Patient will identify positive emotions associated with healthy stress management.   Behavioral Response: Engaged  Intervention: Stress Management  Activity :  Imagery to Connect with Feelings.  LRT introduced the stress management technique of guided imagery.  LRT read a script to allow the patients to examine and connect with their inner feelings.  Patients were to follow along and engaged as LRT read script.  Education:  Stress Management, Discharge Planning.   Education Outcome: Acknowledges edcuation/In group clarification offered/Needs additional education  Clinical Observations/Feedback: Pt attended group.    Tayley Mudrick, LRT/CTRS         Kalyan Barabas A 10/13/2016 12:14 PM 

## 2016-10-13 NOTE — Progress Notes (Signed)
D: Pt  Passive SI contract for safety. denies HI/AVH. Pt complains that she is withdrawing from alcohol. Pt endorsing sx, but when observed interacting on the unit pt does not appear to be having the same Sx she complains about when she is at the window. Pt was given PRN vistaril per [facility] for withdrawal (CIWA) Sx below 10.   A: Pt was offered support and encouragement. Pt was given scheduled medications. Pt was encourage to attend groups. Q 15 minute checks were done for safety.   R:Pt attends groups and interacts well with peers and staff. Pt is taking medication.Pt receptive to treatment and safety maintained on unit.

## 2016-10-14 LAB — TSH: TSH: 1.388 u[IU]/mL (ref 0.350–4.500)

## 2016-10-14 MED ORDER — CITALOPRAM HYDROBROMIDE 20 MG PO TABS
20.0000 mg | ORAL_TABLET | Freq: Every day | ORAL | Status: DC
  Administered 2016-10-15 – 2016-10-16 (×2): 20 mg via ORAL
  Filled 2016-10-14 (×3): qty 1

## 2016-10-14 MED ORDER — HYDROXYZINE HCL 25 MG PO TABS
25.0000 mg | ORAL_TABLET | Freq: Four times a day (QID) | ORAL | Status: AC | PRN
  Administered 2016-10-14 – 2016-10-16 (×3): 25 mg via ORAL
  Filled 2016-10-14: qty 20
  Filled 2016-10-14 (×3): qty 1

## 2016-10-14 NOTE — Plan of Care (Signed)
Problem: Coping: Goal: Ability to cope will improve Outcome: Not Progressing Pt continues to endorse depression, pt continues to allow others to control her own emotions.

## 2016-10-14 NOTE — [facility] Group Notes (Signed)
[facility] LCSW Group Therapy 10/14/2016 1:15 PM  Type of Therapy: Group Therapy- Feelings about Diagnosis  Participation Level: Active   Participation Quality:  Appropriate  Affect:  Appropriate  Cognitive: Alert and Oriented   Insight:  Developing   Engagement in Therapy: Developing/Improving and Engaged   Modes of Intervention: Clarification, Confrontation, Discussion, Education, Exploration, Limit-setting, Orientation, Problem-solving, Rapport Building, Dance movement psychotherapisteality Testing, Socialization and Support  Description of Group:   This group will allow patients to explore their thoughts and feelings about diagnoses they have received. Patients will be guided to explore their level of understanding and acceptance of these diagnoses. Facilitator will encourage patients to process their thoughts and feelings about the reactions of others to their diagnosis, and will guide patients in identifying ways to discuss their diagnosis with significant others in their lives. This group will be process-oriented, with patients participating in exploration of their own experiences as well as giving and receiving support and challenge from other group members.  Summary of Progress/Problems:  Pt continues to have flat affect and express feeling sad. Pt reports that she feels abandoned by her support system. Pt expressed feeling frustrated that people that did not understand that they need to treat her differently because she has a mental illness.   Therapeutic Modalities:   Cognitive Behavioral Therapy Solution Focused Therapy Motivational Interviewing Relapse Prevention Therapy  Vernie ShanksLauren Lore Polka, LCSW 10/14/2016 2:29 PM

## 2016-10-14 NOTE — Progress Notes (Addendum)
[facility] MD Progress Note  10/14/2016 3:48 PM Leslie Sanders  MRN:  [id number] Subjective:   Patient continues to report feeling significantly depressed, and describes a sense of " having little purpose " after her son moved into foster care and her daughter moved out of the house . Denies medication side effects.  Objective : I have discussed case with treatment team and have met with patient . She continues to present depressed, sad, and with a constricted affect . As above, attributes her depression at least partially to issues with her children- her daughter, 60 years old, moved out of the house recently, and her son, aged 48, was placed in foster care, due to behavioral problems that patient felt overwhelmed by.  She denies medication side effects. She reports a sense of sadness and loss of purpose in life, but denies any current suicidal plan or intention and is able to contract for safety on unit. No agitated or disruptive behaviors on unit . As per staff , she has been noted to be out of room more, more visible in day room, and somewhat more interactive with peers and in groups . Fairly responsive to support, encouragement . Not presenting with any alcohol withdrawal symptoms - vitals stable, no diaphoresis, no restlessness, no visual disturbances, no tremors noted. Labs - TSH WNL.      Principal Problem: Bipolar disorder, most recent episode depressed ([place]) Diagnosis:   Patient Active Problem List   Diagnosis Date Noted  . Bipolar disorder, most recent episode depressed ([place]) [F31.30] 10/12/2016  . Anemia, iron deficiency [D50.9] 10/12/2016  . Alcohol use disorder, moderate, dependence ([place]) [F10.20] 10/12/2016  . Lactose intolerance [E73.9] 10/12/2016   Total Time spent with patient: 20 minutes  Past Medical History:  Past Medical History:  Diagnosis Date  . Depression    History reviewed. No pertinent surgical history. Family History:  Family History  Problem Relation Age of  Onset  . Schizophrenia Maternal Aunt   . ADD / ADHD Son   . ODD Son     Social History:  History  Alcohol Use  . 16.8 oz/week  . 28 Cans of beer per week     History  Drug Use No    Social History   Social History  . Marital status: Divorced    Spouse name: N/A  . Number of children: N/A  . Years of education: N/A   Social History Main Topics  . Smoking status: Current Every Day Smoker    Packs/day: 0.50    Types: Cigarettes  . Smokeless tobacco: Never Used  . Alcohol use 16.8 oz/week    28 Cans of beer per week  . Drug use: No  . Sexual activity: Not Currently   Other Topics Concern  . None   Social History Narrative  . None   Additional Social History:    Pain Medications: Denies Prescriptions: Denies Over the Counter: Denies History of alcohol / drug use?: Yes Longest period of sobriety (when/how long): 1.5 weeks Negative Consequences of Use: Financial, Personal relationships Withdrawal Symptoms: Nausea / Vomiting, Irritability, Sweats, Tremors Name of Substance 1: Alcohol 1 - Age of First Use: "in my 5's" 1 - Amount (size/oz): 6-7 beers 1 - Frequency: 3-4 times a week 1 - Duration: "about 2 years"  1 - Last Use / Amount: yesterday afternoon/6 beers  Sleep:  improving  Appetite:  Fair  Current Medications: Current Facility-Administered Medications  Medication Dose Route Frequency Provider Last Rate Last Dose  .  alum & mag hydroxide-simeth (MAALOX/MYLANTA) 200-200-20 MG/5ML suspension 30 mL  30 mL Oral Q4H PRN Niel Hummer, NP   30 mL at 10/14/16 3664  . [START ON 10/15/2016] citalopram (CELEXA) tablet 20 mg  20 mg Oral Daily Lister Brizzi A Luria Rosario, MD      . docusate sodium (COLACE) capsule 100 mg  100 mg Oral Daily Benjamine Mola, FNP   100 mg at 10/14/16 0802  . feeding supplement (ENSURE ENLIVE) (ENSURE ENLIVE) liquid 237 mL  237 mL Oral BID BM Saramma Eappen, MD   237 mL at 10/12/16 1009  . ferrous sulfate tablet 325 mg  325 mg Oral Q breakfast Benjamine Mola, FNP   325 mg at 10/14/16 0802  . hydrOXYzine (ATARAX/VISTARIL) tablet 25 mg  25 mg Oral Q6H PRN Rozetta Nunnery, NP   25 mg at 10/13/16 2159  . lamoTRIgine (LAMICTAL) tablet 25 mg  25 mg Oral Daily Benjamine Mola, FNP   25 mg at 10/14/16 0802  . loperamide (IMODIUM) capsule 2-4 mg  2-4 mg Oral PRN Rozetta Nunnery, NP      . LORazepam (ATIVAN) tablet 1 mg  1 mg Oral Q6H PRN Rozetta Nunnery, NP   1 mg at 10/13/16 0812  . magnesium hydroxide (MILK OF MAGNESIA) suspension 30 mL  30 mL Oral Daily PRN Niel Hummer, NP      . multivitamin with minerals tablet 1 tablet  1 tablet Oral Daily Rozetta Nunnery, NP   1 tablet at 10/14/16 0802  . nicotine (NICODERM CQ - dosed in mg/24 hours) patch 21 mg  21 mg Transdermal Daily Jenne Campus, MD   21 mg at 10/14/16 0804  . OLANZapine (ZYPREXA) tablet 5 mg  5 mg Oral QHS Jenne Campus, MD   5 mg at 10/13/16 2159  . ondansetron (ZOFRAN-ODT) disintegrating tablet 4 mg  4 mg Oral Q6H PRN Rozetta Nunnery, NP   4 mg at 10/13/16 2208  . thiamine (VITAMIN B-1) tablet 100 mg  100 mg Oral Daily Rozetta Nunnery, NP   100 mg at 10/14/16 0802    Lab Results:  Results for orders placed or performed during the hospital encounter of 10/11/16 (from the past 48 hour(s))  Iron and TIBC     Status: Abnormal   Collection Time: 10/13/16  6:27 AM  Result Value Ref Range   Iron 44 28 - 170 ug/dL   TIBC 610 (H) 250 - 450 ug/dL   Saturation Ratios 7 (L) 10.4 - 31.8 %   UIBC 566 ug/dL    Comment: Performed at [facility]  TSH     Status: None   Collection Time: 10/14/16  6:21 AM  Result Value Ref Range   TSH 1.388 0.350 - 4.500 uIU/mL    Comment: Performed by a 3rd Generation assay with a functional sensitivity of <=0.01 uIU/mL. Performed at [facility]     Blood Alcohol level:  Lab Results  Component Value Date   ETH 65 (H) 40/34/7425    Metabolic Disorder Labs: No results found for: HGBA1C, MPG No results found for: PROLACTIN No  results found for: CHOL, TRIG, HDL, CHOLHDL, VLDL, LDLCALC  Physical Findings: AIMS: Facial and Oral Movements Muscles of Facial Expression: None, normal Lips and Perioral Area: None, normal Jaw: None, normal Tongue: None, normal,Extremity Movements Upper (arms, wrists, hands, fingers): None, normal Lower (legs, knees, ankles, toes): None, normal, Trunk Movements Neck, shoulders, hips: None,  normal, Overall Severity Severity of abnormal movements (highest score from questions above): None, normal Incapacitation due to abnormal movements: None, normal Patient's awareness of abnormal movements (rate only patient's report): No Awareness, Dental Status Current problems with teeth and/or dentures?: No Does patient usually wear dentures?: No  CIWA:  CIWA-Ar Total: 1 COWS:     Musculoskeletal: Strength & Muscle Tone: within normal limits no significant tremors or diaphoresis or psychomotor restlessness noted  Gait & Station: normal Patient leans: N/A  Psychiatric Specialty Exam: Physical Exam  ROS no chest pain, no shortness of breath , no vomiting. Reports significant weight loss over recent weeks, associated with depression   Blood pressure 93/74, pulse (!) 101, temperature 97.8 F (36.6 C), temperature source Oral, resp. rate 16, height 5' (1.524 m), weight 113 lb (51.3 kg), last menstrual period 10/07/2016, SpO2 100 %.Body mass index is 22.07 kg/m.  General Appearance: Fairly Groomed- wrapped in blanket  Eye Contact:  Fair  Speech:  Normal Rate  Volume:  Decreased  Mood:  Remains depressed   Affect:  Constricted, sad, slightly more reactive   Thought Process:  Linear  Orientation:  Full (Time, Place, and Person)  Thought Content:  no hallucinations, no delusions, not internally preoccupied   Suicidal Thoughts:  No at this time denies any active SI or self injurious ideations and contracts for safety on unit   Homicidal Thoughts:  No denies violent or homicidal ideations    Memory:  recent and remote grossly intact   Judgement:   Improving   Insight:  Fair  Psychomotor Activity:  Decreased- no gross tremors or diaphoresis noted at this time  Concentration:  Concentration: Good and Attention Span: Good  Recall:  Good  Fund of Knowledge:  Good  Language:  Good  Akathisia:  Negative  Handed:  Right  AIMS (if indicated):     Assets:  Desire for Improvement Resilience  ADL's:  Intact  Cognition:  WNL  Sleep:  Number of Hours: 6    Assessment - patient remains depressed, sad and ruminative about significant losses, particularly related to her children no longer being at home with her. She denies suicidal ideations, but reports a sense of loss of purpose in life at this time. She is tolerating medications well thus far. Staff reports improved participation in milieu.  Currently on Celexa , Zyprexa, and Lamictal Treatment Plan Summary: Daily contact with patient to assess and evaluate symptoms and progress in treatment, Medication management, Plan inpatient treatment  and medications as below Encourage group and milieu participation to work on coping skills and symptom reduction  Continue Lamictal 25 mgrs QDAY for mood disorder, depression Increase  Celexa to 20  mgrs QDAY for depression, anxiety Continue Zyprexa 5 mgrs QHS for mood disorder/bipolar spectrum disorder/ may also help sleep, appetite and to decrease anxious ruminations at nighttime- side effects have been  reviewed  Continue Ativan PRNs for potential Alcohol WDL symptoms Check B12, Folate, Vitamin D- patient reports poor PO intake recently, weight loss, and history of anemia Treatment team working on disposition planning options  Neita Garnet, MD 10/14/2016, 3:48 PMPatient ID: Leslie Sanders, female   DOB: Feb 10, 1976, 40 y.o.   MRN: [id number]

## 2016-10-14 NOTE — Progress Notes (Signed)
Recreation Therapy Notes  Animal-Assisted Activity (AAA) Program Checklist/Progress Notes Patient Eligibility Criteria Checklist & Daily Group note for Rec TxIntervention  Date: 11.07.2017 Time: 2:45pm [address]    AAA/T Program Assumption of Risk Form signed by Patient/ or Parent Legal Guardian Yes  Patient is free of allergies or sever asthma Yes  Patient reports no fear of animals Yes  Patient reports no history of cruelty to animals Yes  Patient understands his/her participation is voluntary Yes  Patient washes hands before animal contact Yes  Patient washes hands after animal contact Yes  Behavioral Response: Observation, Appropriate    Education:Hand Washing, Appropriate Animal Interaction   Education Outcome: Acknowledges education.   Clinical Observations/Feedback: Patient attended session, but chose to observe peer interaction with therapy dog vs having direct contact with therapy dog. Patient asked appropriate questions about therapy dog and his training.   Marykay Lexenise L Ram Haugan, LRT/CTRS        Alexsys Eskin L 10/14/2016 3:02 PM

## 2016-10-14 NOTE — Progress Notes (Signed)
D: Patient appears brighter this afternoon.  This morning she appears flat and blunted with depressed mood.  She is having passive SI with no specific plan.  She rates her depression as an 8; anxiety and hopelessness as a 7.  Her energy is low; concentration is good.  She is observed in the day room interacting with her peers.  She is attending groups.  She is having minimal withdrawal symptoms and has not needed an prn medication. A: Continue to monitor medication management and MD orders.  Safety checks completed every 15 minutes per protocol.  Offer support and encouragement as needed. R: Patient remains safe on the unit; her behavior is appropriate.

## 2016-10-14 NOTE — [facility] Group Notes (Signed)
[facility] Group Notes:  (Nursing/MHT/Case Management/Adjunct)  Date:  10/14/2016  Time:  9:28 AM  Type of Therapy:  Psychoeducational Skills  Participation Level:  Active  Participation Quality:  Appropriate  Affect:  Appropriate  Cognitive:  Appropriate  Insight:  Appropriate  Engagement in Group:  Engaged  Modes of Intervention:  Discussion and Education  Summary of Progress/Problems: Patient attended group.   Marzetta BoardDopson, Shelvy Perazzo E 10/14/2016, 9:28 AM

## 2016-10-14 NOTE — Progress Notes (Signed)
D: Pt passive SI-contracts for safety denies HI/AVH. Pt is pleasant and cooperative. Pt agitated, upset this evening due to her cousin not talking to her. Pt stated her fried has been there more than her family. Pt stated she felt bad about coming down here 10 yrs ago and family have not been here for her like she would want. Pt stated she was having night mares, pt was encouraged to talk to the doctor about possibly trying something that may help with the "bad dreams"  A: Pt was offered support and encouragement. Pt was given scheduled medications. Pt was encourage to attend groups. Q 15 minute checks were done for safety.   R:Pt attends groups and interacts well with peers and staff. Pt is taking medication. Pt has no complaints.Pt receptive to treatment and safety maintained on unit.

## 2016-10-15 LAB — VITAMIN B12: Vitamin B-12: 1323 pg/mL — ABNORMAL HIGH (ref 180–914)

## 2016-10-15 LAB — FOLATE: FOLATE: 9.6 ng/mL (ref >5.9)

## 2016-10-15 MED ORDER — LAMOTRIGINE 25 MG PO TABS
25.0000 mg | ORAL_TABLET | Freq: Two times a day (BID) | ORAL | Status: DC
  Administered 2016-10-15 – 2016-10-20 (×10): 25 mg via ORAL
  Filled 2016-10-15 (×13): qty 1

## 2016-10-15 MED ORDER — TRAZODONE 25 MG HALF TABLET
25.0000 mg | ORAL_TABLET | Freq: Every day | ORAL | Status: DC
  Administered 2016-10-15: 25 mg via ORAL
  Filled 2016-10-15 (×2): qty 1

## 2016-10-15 MED ORDER — TRAZODONE HCL 50 MG PO TABS
ORAL_TABLET | ORAL | Status: AC
  Administered 2016-10-15: 25 mg via ORAL
  Filled 2016-10-15: qty 1

## 2016-10-15 MED ORDER — MIRTAZAPINE 7.5 MG PO TABS
7.5000 mg | ORAL_TABLET | Freq: Every day | ORAL | Status: DC
  Administered 2016-10-15 – 2016-10-20 (×7): 7.5 mg via ORAL
  Filled 2016-10-15 (×6): qty 1
  Filled 2016-10-15 (×2): qty 0.5
  Filled 2016-10-15: qty 1
  Filled 2016-10-15: qty 0.5

## 2016-10-15 NOTE — Progress Notes (Signed)
Patient ID: Leslie Sanders, female   DOB: 03/14/1976, 40 y.o.   MRN: [id number]  Pt currently presents with a sad affect and suspicious behavior. Pt reports to Clinical research associatewriter that their goal is to "figure out what medications I am taking for what, I need the long list of everything." Pt states "I am still having thoughts of hurting myself, I don't think the medications are working." Pt reports decreased appetite, states "I have been drinking my ensures but all I could eat today was grits and a taco. Then I vomited everything up this morning after my medicines." Pt reports poor sleep with current medication regimen due to vivid nightmare she experiences throughout the night.   Pt provided with medications per providers orders. Pt's labs and vitals were monitored throughout the night. Pt supported emotionally and encouraged to express concerns and questions. Pt educated on medications and nutrition. Pt given a 1:1 on medications and diagnosis, needs reinforcement due to possible confusion.  Pt's safety ensured with 15 minute and environmental checks. Pt reports passive SI, no plan currently as she is in a "safe place." Pt currently denies HI and A/V hallucinations. Pt verbally agrees to seek staff if HI or A/VH occurs and to consult with staff before acting on any harmful thoughts. Pt attitude negative, tearful during initial interaction. Pt reports that she was having a bad night because her visitors did not show up. Will continue POC.

## 2016-10-15 NOTE — Plan of Care (Signed)
Problem: Safety: Goal: Periods of time without injury will increase Outcome: Progressing Although patient is endorsing passive SI, she has not engaged in self harm. Denies plan, intent.  Problem: Medication: Goal: Compliance with prescribed medication regimen will improve Outcome: Progressing Patient is med compliant.

## 2016-10-15 NOTE — Care Management Utilization Note (Signed)
   Per State Regulation 482.30  This chart was reviewed for necessity with respect to the patient's Admission/ Duration of stay.  Next review date: 10/19/16  Juma Oxley Morrison RN, BSN 

## 2016-10-15 NOTE — Progress Notes (Signed)
[facility] MD Progress Note  10/15/2016 10:08 AM Leslie Sanders  MRN:  [id number] Subjective:   "I feel worse. I'm the same. Super depressed."  Objective: Pt seen and chart reviewed. Pt is alert/oriented x4, very depressed, cooperative, and appropriate to situation. Pt denies homicidal ideation and psychosis and does not appear to be responding to internal stimuli. Pt states she is suicidal but without plan at this time. She reports severe depression and "terrible sleep" last night.   Principal Problem: Bipolar disorder, most recent episode depressed (HCC) Diagnosis:   Patient Active Problem List   Diagnosis Date Noted  . Bipolar disorder, most recent episode depressed (HCC) [F31.30] 10/12/2016  . Anemia, iron deficiency [D50.9] 10/12/2016  . Alcohol use disorder, moderate, dependence (HCC) [F10.20] 10/12/2016  . Lactose intolerance [E73.9] 10/12/2016   Total Time spent with patient: 20 minutes  Past Medical History:  Past Medical History:  Diagnosis Date  . Depression    History reviewed. No pertinent surgical history. Family History:  Family History  Problem Relation Age of Onset  . Schizophrenia Maternal Aunt   . ADD / ADHD Son   . ODD Son     Social History:  History  Alcohol Use  . 16.8 oz/week  . 28 Cans of beer per week     History  Drug Use No    Social History   Social History  . Marital status: Divorced    Spouse name: N/A  . Number of children: N/A  . Years of education: N/A   Social History Main Topics  . Smoking status: Current Every Day Smoker    Packs/day: 0.50    Types: Cigarettes  . Smokeless tobacco: Never Used  . Alcohol use 16.8 oz/week    28 Cans of beer per week  . Drug use: No  . Sexual activity: Not Currently   Other Topics Concern  . None   Social History Narrative  . None   Additional Social History:    Pain Medications: Denies Prescriptions: Denies Over the Counter: Denies History of alcohol / drug use?: Yes Longest period of  sobriety (when/how long): 1.5 weeks Negative Consequences of Use: Financial, Personal relationships Withdrawal Symptoms: Nausea / Vomiting, Irritability, Sweats, Tremors Name of Substance 1: Alcohol 1 - Age of First Use: "in my 40's" 1 - Amount (size/oz): 6-7 beers 1 - Frequency: 3-4 times a week 1 - Duration: "about 2 years"  1 - Last Use / Amount: yesterday afternoon/6 beers  Sleep:  improving  Appetite:  Fair  Current Medications: Current Facility-Administered Medications  Medication Dose Route Frequency Provider Last Rate Last Dose  . alum & mag hydroxide-simeth (MAALOX/MYLANTA) 200-200-20 MG/5ML suspension 30 mL  30 mL Oral Q4H PRN Thermon Leyland, NP   30 mL at 10/14/16 4098  . citalopram (CELEXA) tablet 20 mg  20 mg Oral Daily Craige Cotta, MD   20 mg at 10/15/16 0806  . docusate sodium (COLACE) capsule 100 mg  100 mg Oral Daily Beau Fanny, FNP   100 mg at 10/15/16 1191  . feeding supplement (ENSURE ENLIVE) (ENSURE ENLIVE) liquid 237 mL  237 mL Oral BID BM Saramma Eappen, MD   237 mL at 10/12/16 1009  . ferrous sulfate tablet 325 mg  325 mg Oral Q breakfast Beau Fanny, FNP   325 mg at 10/15/16 4782  . hydrOXYzine (ATARAX/VISTARIL) tablet 25 mg  25 mg Oral Q6H PRN Kerry Hough, PA-C   25 mg at 10/14/16 2257  .  lamoTRIgine (LAMICTAL) tablet 25 mg  25 mg Oral Daily Beau FannyJohn C Withrow, FNP   25 mg at 10/15/16 0806  . magnesium hydroxide (MILK OF MAGNESIA) suspension 30 mL  30 mL Oral Daily PRN Thermon LeylandLaura A Davis, NP      . mirtazapine (REMERON) tablet 7.5 mg  7.5 mg Oral QHS Kerry HoughSpencer E Simon, PA-C   7.5 mg at 10/15/16 0048  . multivitamin with minerals tablet 1 tablet  1 tablet Oral Daily Jackelyn PolingJason A Berry, NP   1 tablet at 10/15/16 0806  . nicotine (NICODERM CQ - dosed in mg/24 hours) patch 21 mg  21 mg Transdermal Daily Craige CottaFernando A Cobos, MD   21 mg at 10/15/16 0807  . OLANZapine (ZYPREXA) tablet 5 mg  5 mg Oral QHS Craige CottaFernando A Cobos, MD   5 mg at 10/14/16 2257  . thiamine (VITAMIN  B-1) tablet 100 mg  100 mg Oral Daily Jackelyn PolingJason A Berry, NP   100 mg at 10/15/16 [id number]    Lab Results:  Results for orders placed or performed during the hospital encounter of 10/11/16 (from the past 48 hour(s))  TSH     Status: None   Collection Time: 10/14/16  6:21 AM  Result Value Ref Range   TSH 1.388 0.350 - 4.500 uIU/mL    Comment: Performed by a 3rd Generation assay with a functional sensitivity of <=0.01 uIU/mL. [facility]     Blood Alcohol level:  Lab Results  Component Value Date   ETH 65 (H) 10/11/2016    Metabolic Disorder Labs: No results found for: HGBA1C, MPG No results found for: PROLACTIN No results found for: CHOL, TRIG, HDL, CHOLHDL, VLDL, LDLCALC  Physical Findings: AIMS: Facial and Oral Movements Muscles of Facial Expression: None, normal Lips and Perioral Area: None, normal Jaw: None, normal Tongue: None, normal,Extremity Movements Upper (arms, wrists, hands, fingers): None, normal Lower (legs, knees, ankles, toes): None, normal, Trunk Movements Neck, shoulders, hips: None, normal, Overall Severity Severity of abnormal movements (highest score from questions above): None, normal Incapacitation due to abnormal movements: None, normal Patient's awareness of abnormal movements (rate only patient's report): No Awareness, Dental Status Current problems with teeth and/or dentures?: No Does patient usually wear dentures?: No  CIWA:  CIWA-Ar Total: 1 COWS:     Musculoskeletal: Strength & Muscle Tone: within normal limits no significant tremors or diaphoresis or psychomotor restlessness noted  Gait & Station: normal Patient leans: N/A  Psychiatric Specialty Exam: Physical Exam  Review of Systems  Psychiatric/Behavioral: Positive for depression and suicidal ideas. The patient is nervous/anxious and has insomnia.   All other systems reviewed and are negative.  no chest pain, no shortness of breath , no vomiting. Reports  significant weight loss over recent weeks, associated with depression   Blood pressure 104/67, pulse 85, temperature 97.6 F (36.4 C), temperature source Oral, resp. rate 18, height 5' (1.524 m), weight 51.3 kg (113 lb), last menstrual period 10/07/2016, SpO2 100 %.Body mass index is 22.07 kg/m.  General Appearance: Fairly Groomed  Eye Contact:  Fair  Speech:  Normal Rate  Volume:  Decreased  Mood:  Remains depressed   Affect:  Constricted, sad, slightly more reactive   Thought Process:  Linear  Orientation:  Full (Time, Place, and Person)  Thought Content:  no hallucinations, no delusions, not internally preoccupied   Suicidal Thoughts:  Yes and worsening   Homicidal Thoughts:  No denies violent or homicidal ideations   Memory:  recent and remote  grossly intact   Judgement:   Improving   Insight:  Fair  Psychomotor Activity:  Decreased- no gross tremors or diaphoresis noted at this time  Concentration:  Concentration: Good and Attention Span: Good  Recall:  Good  Fund of Knowledge:  Good  Language:  Good  Akathisia:  Negative  Handed:  Right  AIMS (if indicated):     Assets:  Desire for Improvement Resilience  ADL's:  Intact  Cognition:  WNL  Sleep:  Number of Hours: 5   Treatment Plan Summary: Bipolar disorder, most recent episode depressed (HCC) unstable, managed as below: Daily contact with patient to assess and evaluate symptoms and progress in treatment, Medication management, Plan inpatient treatment  and medications as below Encourage group and milieu participation to work on coping skills and symptom reduction  -Increase Lamictal 25 mg (to bid) for mood disorder, depression Continue Celexa to 20  mgrs QDAY for depression, anxiety Continue Zyprexa 5 mgrs QHS for mood disorder/bipolar spectrum disorder/ may also help sleep, appetite and to decrease anxious ruminations at nighttime- side effects have been  reviewed  -Continue Remeron 7.5mg  po qhs for insomnia and  depression Continue Ativan PRNs for potential Alcohol WDL symptoms Check B12, Folate, Vitamin D- patient reports poor PO intake recently, weight loss, and history of anemia Treatment team working on disposition planning options   Beau FannyWithrow, John C, FNP 10/15/2016, 10:08 AM    Agree with NP Progress Note as above

## 2016-10-15 NOTE — Progress Notes (Signed)
D: Patient up and visible in the milieu. Spoke with patient 1:1. Rates sleep as poor, appetite as fair, energy as normal and concentration as good. Patient's affect flat, anxious with congruent mood. Rating depression at an 8/10, hopelessness at a 7/10 and anxiety at a 6/10. States goal for today is to "to not have a feeling of hopelessness and wanting to not be here anymore. Think positive and to talk with my doctor and social worker." Denies pain. Patient vomited x 1 shortly after receiving AM meds. Denies N/V since.   A: Medicated per orders, no prns given or requested. Emotional support offered and self inventory reviewed. Encouraged patient to complete Suicide Safety Plan as passive SI is persisting. Discussed POC with MD, SW.   R: Patient verbalizes understanding of POC. Minimally receptive to suggestion that patient work on Water engineersafety plan, workbook. Patient endorsing passive SI but denies plan, intent. No  HI and remains safe on level III obs.

## 2016-10-15 NOTE — Progress Notes (Signed)
Recreation Therapy Notes  Date:  10/15/16 Time: 0930 Location: [address] Dayroom  Group Topic: Stress Management  Goal Area(s) Addresses:  Patient will verbalize importance of using healthy stress management.  Patient will identify positive emotions associated with healthy stress management.   Intervention: Calm App  Activity :  Forgiveness of Self Imagery.  LRT introduced the concept of guided imagery.  LRT played an imagery meditation from the Calm app so patients could engage and participate in the activity.  Patients were to follow along with the recording to participate in the activity.    Education:  Stress Management, Discharge Planning.   Education Outcome: Acknowledges edcuation/In group clarification offered/Needs additional education  Clinical Observations/Feedback: Pt did not attend group.     Leslie Sanders, LRT/CTRS         Jayke Caul A 10/15/2016 11:50 AM 

## 2016-10-15 NOTE — [facility] Group Notes (Signed)
Patient attend group. Her day was a  2. Her goal to see positive in her life and her family. Patient said she tried to get her medication's but can because doctor patient said she did not achieve her goal. Patient said there is no things for her to do. She do not like watching tv . She donot like staying in her room all day and she do not want to watching tv all day. Patient said there is nothing for her to do that will hold her interest. Patient said they were supposed to go to gym but that was cancel no explaination why? Patient said she wonder if there is a need for her being here.

## 2016-10-15 NOTE — [facility] Group Notes (Signed)
[facility] LCSW Group Therapy 10/15/2016 1:15 PM  Type of Therapy: Group Therapy- Emotion Regulation  Participation Level: Active   Participation Quality:  Appropriate  Affect: Appropriate  Cognitive: Alert and Oriented   Insight:  Developing/Improving  Engagement in Therapy: Developing/Improving and Engaged   Modes of Intervention: Clarification, Confrontation, Discussion, Education, Exploration, Limit-setting, Orientation, Problem-solving, Rapport Building, Dance movement psychotherapisteality Testing, Socialization and Support  Summary of Progress/Problems: The topic for group today was emotional regulation. This group focused on both positive and negative emotion identification and allowed group members to process ways to identify feelings, regulate negative emotions, and find healthy ways to manage internal/external emotions. Group members were asked to reflect on a time when their reaction to an emotion led to a negative outcome and explored how alternative responses using emotion regulation would have benefited them. Group members were also asked to discuss a time when emotion regulation was utilized when a negative emotion was experienced. Pt continues to be negative in her participation, however is participating willingly and expresses a desire to feel better. Pt's participation is focused on her lack of support and how her diagnosis is what causes her dysregulated emotions and overreactions.    Vernie ShanksLauren Briell Paulette, LCSW 10/15/2016 3:23 PM

## 2016-10-16 LAB — VITAMIN D 25 HYDROXY (VIT D DEFICIENCY, FRACTURES): Vit D, 25-Hydroxy: 5.7 ng/mL — ABNORMAL LOW (ref 30.0–100.0)

## 2016-10-16 MED ORDER — VITAMIN D3 25 MCG (1000 UNIT) PO TABS
1000.0000 [IU] | ORAL_TABLET | Freq: Every day | ORAL | Status: DC
  Administered 2016-10-16 – 2016-10-21 (×6): 1000 [IU] via ORAL
  Filled 2016-10-16 (×8): qty 1

## 2016-10-16 MED ORDER — CLONIDINE HCL 0.1 MG PO TABS
0.1000 mg | ORAL_TABLET | Freq: Every day | ORAL | Status: DC
  Administered 2016-10-16 – 2016-10-18 (×3): 0.1 mg via ORAL
  Filled 2016-10-16 (×5): qty 1

## 2016-10-16 MED ORDER — OLANZAPINE 7.5 MG PO TABS
7.5000 mg | ORAL_TABLET | Freq: Every day | ORAL | Status: DC
  Administered 2016-10-16 – 2016-10-20 (×5): 7.5 mg via ORAL
  Filled 2016-10-16 (×8): qty 1

## 2016-10-16 NOTE — Progress Notes (Addendum)
D: Patient is preoccupied with her medications.  She requested that she wait 15-20 minutes after breakfast to take her medications because she was afraid she would "throw them up."  Patient was given an ensure to help.  As she was taking her medications, she indicated that they "were not working.  How do I know if they're working?  I'm still having tremors."  [facility].  She is concerned she "will have a reaction to the medications."  She continues to report withdrawal symptoms as cravings, chilling and irritability.  No tremors were noted when she was taking her meds. Patient reports passive SI with no specific plan.  She remains apprehensive regarding discharge. A: Continue to monitor medication management and MD orders.  Safety checks continued every 15 minutes per protocol.  Offer support and encouragement as needed. R: Patient is receptive to staff; her behavior is appropriate.

## 2016-10-16 NOTE — [facility] Group Notes (Signed)
[facility] Group Notes:  Nursing Date:  10/16/2016  Time:  0900 am  Type of Therapy:  Psychoeducational Skills  Participation Level:  Active  Participation Quality:  Appropriate and Attentive  Affect:  Appropriate  Cognitive:  Alert and Appropriate  Insight:  Appropriate  Engagement in Group:  Developing/Improving  Modes of Intervention:  Support  Summary of Progress/Problems:  Patient states that singing and listening to music brings her joy.    Cranford MonBeaudry, Steed Kanaan Evans 10/16/2016, 10:56 AM

## 2016-10-16 NOTE — Progress Notes (Addendum)
[facility] MD Progress Note  10/16/2016 4:19 PM Leslie Sanders  MRN:  [id number] Subjective:   Patient reports ongoing depression, and complains of worsening insomnia, partially related to nightmares .    Objective : I have discussed case with treatment team and have met with patient . Patient presents partially improved compared to initial presentation - although still depressed, she is more verbal, more communicative, has better eye contact, is better groomed, and as per notes/staff reports she is now less isolative and more interactive in milieu. She reports ongoing depression, denies active SI and contracts for safety, she describes nightmares , related to prior traumatic experiences as well as insomnia. Although dysphoric , irritable, no overtly agitated or disruptive behaviors on unit . As discussed with staff, patient had potential bed availability at [facility]) today, but expressed not feeling ready for discharge due to persistence of depression and unable to contract for safety if discharged.  Labs reviewed - Folate and B12 serum levels unremarkable ( B12 slightly elevated ) , Vitamin D serum level very low.    Principal Problem: Bipolar disorder, most recent episode depressed ([place]) Diagnosis:   Patient Active Problem List   Diagnosis Date Noted  . Bipolar disorder, most recent episode depressed ([place]) [F31.30] 10/12/2016  . Anemia, iron deficiency [D50.9] 10/12/2016  . Alcohol use disorder, moderate, dependence ([place]) [F10.20] 10/12/2016  . Lactose intolerance [E73.9] 10/12/2016   Total Time spent with patient: 20 minutes  Past Medical History:  Past Medical History:  Diagnosis Date  . Depression    History reviewed. No pertinent surgical history. Family History:  Family History  Problem Relation Age of Onset  . Schizophrenia Maternal Aunt   . ADD / ADHD Son   . ODD Son     Social History:  History  Alcohol Use  . 16.8 oz/week  . 28 Cans of beer per week     History   Drug Use No    Social History   Social History  . Marital status: Divorced    Spouse name: N/A  . Number of children: N/A  . Years of education: N/A   Social History Main Topics  . Smoking status: Current Every Day Smoker    Packs/day: 0.50    Types: Cigarettes  . Smokeless tobacco: Never Used  . Alcohol use 16.8 oz/week    28 Cans of beer per week  . Drug use: No  . Sexual activity: Not Currently   Other Topics Concern  . None   Social History Narrative  . None   Additional Social History:    Pain Medications: Denies Prescriptions: Denies Over the Counter: Denies History of alcohol / drug use?: Yes Longest period of sobriety (when/how long): 1.5 weeks Negative Consequences of Use: Financial, Personal relationships Withdrawal Symptoms: Nausea / Vomiting, Irritability, Sweats, Tremors Name of Substance 1: Alcohol 1 - Age of First Use: "in my 57's" 1 - Amount (size/oz): 6-7 beers 1 - Frequency: 3-4 times a week 1 - Duration: "about 2 years"  1 - Last Use / Amount: yesterday afternoon/6 beers  Sleep:  improving  Appetite:  Fair  Current Medications: Current Facility-Administered Medications  Medication Dose Route Frequency Provider Last Rate Last Dose  . alum & mag hydroxide-simeth (MAALOX/MYLANTA) 200-200-20 MG/5ML suspension 30 mL  30 mL Oral Q4H PRN Niel Hummer, NP   30 mL at 10/14/16 2248  . cholecalciferol (VITAMIN D) tablet 1,000 Units  1,000 Units Oral Daily Jenne Campus, MD  1,000 Units at 10/16/16 1259  . cloNIDine (CATAPRES) tablet 0.1 mg  0.1 mg Oral QHS Arnesha Schiraldi A Americo Vallery, MD      . docusate sodium (COLACE) capsule 100 mg  100 mg Oral Daily Benjamine Mola, FNP   100 mg at 10/16/16 0837  . feeding supplement (ENSURE ENLIVE) (ENSURE ENLIVE) liquid 237 mL  237 mL Oral BID BM Saramma Eappen, MD   237 mL at 10/16/16 0806  . ferrous sulfate tablet 325 mg  325 mg Oral Q breakfast Benjamine Mola, FNP   325 mg at 10/16/16 6195  . hydrOXYzine  (ATARAX/VISTARIL) tablet 25 mg  25 mg Oral Q6H PRN Laverle Hobby, PA-C   25 mg at 10/14/16 2257  . lamoTRIgine (LAMICTAL) tablet 25 mg  25 mg Oral BID Benjamine Mola, FNP   25 mg at 10/16/16 0837  . magnesium hydroxide (MILK OF MAGNESIA) suspension 30 mL  30 mL Oral Daily PRN Niel Hummer, NP      . mirtazapine (REMERON) tablet 7.5 mg  7.5 mg Oral QHS Laverle Hobby, PA-C   7.5 mg at 10/15/16 2256  . multivitamin with minerals tablet 1 tablet  1 tablet Oral Daily Rozetta Nunnery, NP   1 tablet at 10/16/16 0837  . nicotine (NICODERM CQ - dosed in mg/24 hours) patch 21 mg  21 mg Transdermal Daily Jenne Campus, MD   21 mg at 10/16/16 0841  . OLANZapine (ZYPREXA) tablet 7.5 mg  7.5 mg Oral QHS Myer Peer Torie Towle, MD      . thiamine (VITAMIN B-1) tablet 100 mg  100 mg Oral Daily Rozetta Nunnery, NP   100 mg at 10/16/16 0932    Lab Results:  Results for orders placed or performed during the hospital encounter of 10/11/16 (from the past 48 hour(s))  Vitamin B12     Status: Abnormal   Collection Time: 10/15/16  6:23 AM  Result Value Ref Range   Vitamin B-12 1,323 (H) 180 - 914 pg/mL    Comment: (NOTE) This assay is not validated for testing neonatal or myeloproliferative syndrome specimens for Vitamin B12 levels. Performed at [place] D 25 Hydroxy (Vit-D Deficiency, Fractures)     Status: Abnormal   Collection Time: 10/15/16  6:23 AM  Result Value Ref Range   Vit D, 25-Hydroxy 5.7 (L) 30.0 - 100.0 ng/mL    Comment: (NOTE) Vitamin D deficiency has been defined by the [place] practice guideline as a level of serum 25-OH vitamin D less than 20 ng/mL (1,2). The Endocrine Society went on to further define vitamin D insufficiency as a level between 21 and 29 ng/mL (2). 1. IOM (Institute of Medicine). 2010. Dietary reference   intakes for calcium and D. [place]: The   [employer]. 2. Holick MF, Binkley Arenac,  Bischoff-Ferrari HA, et al.   Evaluation, treatment, and prevention of vitamin D   deficiency: an Endocrine Society clinical practice   guideline. JCEM. 2011 Jul; 96(7):1911-30. Performed At: [facility] [place], [place] [id number] Lindon Romp MD XI:[id number] Performed at [facility]   Folate     Status: None   Collection Time: 10/15/16  6:23 AM  Result Value Ref Range   Folate 9.6 >5.9 ng/mL    Comment: Performed at [facility]    Blood Alcohol level:  Lab Results  Component Value Date   ETH 65 (  H) 04/59/9774    Metabolic Disorder Labs: No results found for: HGBA1C, MPG No results found for: PROLACTIN No results found for: CHOL, TRIG, HDL, CHOLHDL, VLDL, LDLCALC  Physical Findings: AIMS: Facial and Oral Movements Muscles of Facial Expression: None, normal Lips and Perioral Area: None, normal Jaw: None, normal Tongue: None, normal,Extremity Movements Upper (arms, wrists, hands, fingers): None, normal Lower (legs, knees, ankles, toes): None, normal, Trunk Movements Neck, shoulders, hips: None, normal, Overall Severity Severity of abnormal movements (highest score from questions above): None, normal Incapacitation due to abnormal movements: None, normal Patient's awareness of abnormal movements (rate only patient's report): No Awareness, Dental Status Current problems with teeth and/or dentures?: No Does patient usually wear dentures?: No  CIWA:  CIWA-Ar Total: 1 COWS:     Musculoskeletal: Strength & Muscle Tone: within normal limits no significant tremors or diaphoresis or psychomotor restlessness noted  Gait & Station: normal Patient leans: N/A  Psychiatric Specialty Exam: Physical Exam  ROS no chest pain, no shortness of breath , no vomiting. Reports significant weight loss over recent weeks, associated with depression   Blood pressure 105/72, pulse 83, temperature 98 F (36.7 C), temperature source Oral,  resp. rate 18, height 5' (1.524 m), weight 113 lb (51.3 kg), last menstrual period 10/07/2016, SpO2 100 %.Body mass index is 22.07 kg/m.  General Appearance: improved grooming   Eye Contact:  Improved   Speech:  Normal Rate  Volume:  Normal  Mood: depressed   Affect:  Remains constricted, vaguely irritable, but more reactive than on admission, no longer tearful  Thought Process:  Linear  Orientation:  Full (Time, Place, and Person)  Thought Content:  no hallucinations, no delusions, not internally preoccupied   Suicidal Thoughts:  No at this time denies any active SI or self injurious ideations and contracts for safety on unit , but states does not feel ready or safe to discharge at this time ( had possible bed availability at [facility] today)   Homicidal Thoughts:  No denies violent or homicidal ideations   Memory:  recent and remote grossly intact   Judgement:   Improving   Insight:  Fair  Psychomotor Activity:  improved - no gross tremors or diaphoresis noted at this time  Concentration:  Concentration: Good and Attention Span: Good  Recall:  Good  Fund of Knowledge:  Good  Language:  Good  Akathisia:  Negative  Handed:  Right  AIMS (if indicated):     Assets:  Desire for Improvement Resilience  ADL's:  Intact  Cognition:  WNL  Sleep:  Number of Hours: 6.25    Assessment - patient remains depressed and vaguely irritable and dysphoric, but overall presenting with partial improvement as noted by improved rate of speech, improved affect, and improved grooming . She is more visible in milieu and has started interacting with peers. She reports ongoing insomnia, ongoing subjective sense of mood swings and irritability . Thus far tolerating medications well . Of note, vitamin D serum level very low . Treatment Plan Summary: Daily contact with patient to assess and evaluate symptoms and progress in treatment, Medication management, Plan inpatient treatment  and medications as below Encourage  group and milieu participation to work on coping skills and symptom reduction  Continue Lamictal 25 mgrs BID for mood disorder, depression Continue Remeron 7.5 mgrs QHS for depression and insomnia  Will D/C Celexa, to minimize potential increased mood instability or emerging hypomania on antidepressant management  Increase Zyprexa to 7.5 mgrs QHS for mood disorder/bipolar  spectrum disorder/ may also help sleep Start Clonidine 0.1 mgrs QHS for PTSD type nightmares - side effects to include risk of hypotension, dizziness, falls reviewed  Start Vitamin D supplementation   Neita Garnet, MD 10/16/2016, 4:19 PM   Patient ID: Leslie Sanders, female   DOB: 07/15/1976, 40 y.o.   MRN: [id number]

## 2016-10-16 NOTE — Plan of Care (Signed)
Problem: Education: Goal: Emotional status will improve Outcome: Not Progressing Patient continues to be apprehensive regarding discharge.

## 2016-10-16 NOTE — [facility] Group Notes (Signed)
[facility] 10/16/2016 1:15pm  Type of Therapy: Mental Health Association Presentation  Participation Level: Active  Participation Quality: Attentive  Affect: Appropriate  Cognitive: Oriented  Insight: Developing/Improving  Engagement in Therapy: Engaged  Modes of Intervention: Discussion, Education and Socialization  Summary of Progress/Problems: Mental Health Association (MHA) Speaker came to talk about his personal journey with substance abuse and addiction. The pt processed ways by which to relate to the speaker. MHA speaker provided handouts and educational information pertaining to groups and services offered by the [facility]. Pt was engaged in speaker's presentation and was receptive to resources provided.    Vernie ShanksLauren Marcayla Budge, LCSW 10/16/2016 1:37 PM

## 2016-10-17 MED ORDER — NAPHAZOLINE-GLYCERIN 0.012-0.2 % OP SOLN
1.0000 [drp] | Freq: Four times a day (QID) | OPHTHALMIC | Status: DC | PRN
  Administered 2016-10-17 – 2016-10-19 (×2): 2 [drp] via OPHTHALMIC
  Filled 2016-10-17: qty 15

## 2016-10-17 MED ORDER — ACAMPROSATE CALCIUM 333 MG PO TBEC
666.0000 mg | DELAYED_RELEASE_TABLET | Freq: Three times a day (TID) | ORAL | Status: DC
  Administered 2016-10-17 – 2016-10-21 (×12): 666 mg via ORAL
  Filled 2016-10-17 (×17): qty 2

## 2016-10-17 NOTE — [facility]HH Group Notes (Signed)
Patient attend group. Her day was a 3. Patient said she's upset with staff. She was not feeling well and staff did not believe her. Patient said she dont know why she cannot eat her meals and they stay down.

## 2016-10-17 NOTE — Progress Notes (Addendum)
[facility] MD Progress Note  10/17/2016 1:46 PM ASHBY LEFLORE  MRN:  [id number] Subjective:  She continues to feel depressed , but acknowledges her mood is gradually getting better. She continues to ruminate about her children, and about her son no longer being at home. Also expresses sadness and frustration that no family member has called or visited her on the unit, even thought they know she is admitted .Today, however, more future oriented,and states that next week there is a meeting with son's  foster mother and others, which she is invited to attend, in order to discuss son's progress and steps that need to be taken in order for him to eventually return home.    Patient reports improved sleep, slept better, states she did not have nightmares last night. Denies medication side effects.     Objective : I have discussed case with treatment team and have met with patient . Staff reports indicate patient partially improved , presenting with improving affect  Patient presents with gradually improving mood and affect - still depressed, constricted, but improved compared to admission, and is better groomed, has better eye contact, is more communicative, and as noted above, more future oriented . Denies medication side effects- as noted, slept better last night . No disruptive or agitated behaviors on unit, visible in milieu She is not presenting with any symptoms of withdrawal- no tremors, no diaphoresis, no acute distress . Vitals are stable.    Principal Problem: Bipolar disorder, most recent episode depressed ([place]) Diagnosis:   Patient Active Problem List   Diagnosis Date Noted  . Bipolar disorder, most recent episode depressed ([place]) [F31.30] 10/12/2016  . Anemia, iron deficiency [D50.9] 10/12/2016  . Alcohol use disorder, moderate, dependence ([place]) [F10.20] 10/12/2016  . Lactose intolerance [E73.9] 10/12/2016   Total Time spent with patient: 20 minutes  Past Medical History:  Past Medical  History:  Diagnosis Date  . Depression    History reviewed. No pertinent surgical history. Family History:  Family History  Problem Relation Age of Onset  . Schizophrenia Maternal Aunt   . ADD / ADHD Son   . ODD Son     Social History:  History  Alcohol Use  . 16.8 oz/week  . 28 Cans of beer per week     History  Drug Use No    Social History   Social History  . Marital status: Divorced    Spouse name: N/A  . Number of children: N/A  . Years of education: N/A   Social History Main Topics  . Smoking status: Current Every Day Smoker    Packs/day: 0.50    Types: Cigarettes  . Smokeless tobacco: Never Used  . Alcohol use 16.8 oz/week    28 Cans of beer per week  . Drug use: No  . Sexual activity: Not Currently   Other Topics Concern  . None   Social History Narrative  . None   Additional Social History:    Pain Medications: Denies Prescriptions: Denies Over the Counter: Denies History of alcohol / drug use?: Yes Longest period of sobriety (when/how long): 1.5 weeks Negative Consequences of Use: Financial, Personal relationships Withdrawal Symptoms: Nausea / Vomiting, Irritability, Sweats, Tremors Name of Substance 1: Alcohol 1 - Age of First Use: "in my 40's" 1 - Amount (size/oz): 6-7 beers 1 - Frequency: 3-4 times a week 1 - Duration: "about 2 years"  1 - Last Use / Amount: yesterday afternoon/6 beers  Sleep:  improving  Appetite:  Fair  Current Medications: Current Facility-Administered Medications  Medication Dose Route Frequency Provider Last Rate Last Dose  . alum & mag hydroxide-simeth (MAALOX/MYLANTA) 200-200-20 MG/5ML suspension 30 mL  30 mL Oral Q4H PRN Niel Hummer, NP   30 mL at 10/14/16 8657  . cholecalciferol (VITAMIN D) tablet 1,000 Units  1,000 Units Oral Daily Jenne Campus, MD   1,000 Units at 10/17/16 0751  . cloNIDine (CATAPRES) tablet 0.1 mg  0.1 mg Oral QHS Myer Peer Lashon Beringer, MD   0.1 mg at 10/16/16 2240  . docusate sodium  (COLACE) capsule 100 mg  100 mg Oral Daily Benjamine Mola, FNP   100 mg at 10/17/16 0751  . feeding supplement (ENSURE ENLIVE) (ENSURE ENLIVE) liquid 237 mL  237 mL Oral BID BM Saramma Eappen, MD   237 mL at 10/16/16 0806  . ferrous sulfate tablet 325 mg  325 mg Oral Q breakfast Benjamine Mola, FNP   325 mg at 10/17/16 0751  . hydrOXYzine (ATARAX/VISTARIL) tablet 25 mg  25 mg Oral Q6H PRN Laverle Hobby, PA-C   25 mg at 10/16/16 2240  . lamoTRIgine (LAMICTAL) tablet 25 mg  25 mg Oral BID Benjamine Mola, FNP   25 mg at 10/17/16 0750  . magnesium hydroxide (MILK OF MAGNESIA) suspension 30 mL  30 mL Oral Daily PRN Niel Hummer, NP      . mirtazapine (REMERON) tablet 7.5 mg  7.5 mg Oral QHS Laverle Hobby, PA-C   7.5 mg at 10/16/16 2240  . multivitamin with minerals tablet 1 tablet  1 tablet Oral Daily Rozetta Nunnery, NP   1 tablet at 10/17/16 0750  . nicotine (NICODERM CQ - dosed in mg/24 hours) patch 21 mg  21 mg Transdermal Daily Jenne Campus, MD   21 mg at 10/17/16 0753  . OLANZapine (ZYPREXA) tablet 7.5 mg  7.5 mg Oral QHS Myer Peer Darenda Fike, MD   7.5 mg at 10/16/16 2240  . thiamine (VITAMIN B-1) tablet 100 mg  100 mg Oral Daily Rozetta Nunnery, NP   100 mg at 10/17/16 0751    Lab Results:  No results found for this or any previous visit (from the past 48 hour(s)).  Blood Alcohol level:  Lab Results  Component Value Date   ETH 65 (H) 84/69/6295    Metabolic Disorder Labs: No results found for: HGBA1C, MPG No results found for: PROLACTIN No results found for: CHOL, TRIG, HDL, CHOLHDL, VLDL, LDLCALC  Physical Findings: AIMS: Facial and Oral Movements Muscles of Facial Expression: None, normal Lips and Perioral Area: None, normal Jaw: None, normal Tongue: None, normal,Extremity Movements Upper (arms, wrists, hands, fingers): None, normal Lower (legs, knees, ankles, toes): None, normal, Trunk Movements Neck, shoulders, hips: None, normal, Overall Severity Severity of abnormal  movements (highest score from questions above): None, normal Incapacitation due to abnormal movements: None, normal Patient's awareness of abnormal movements (rate only patient's report): No Awareness, Dental Status Current problems with teeth and/or dentures?: No Does patient usually wear dentures?: No  CIWA:  CIWA-Ar Total: 1 COWS:     Musculoskeletal: Strength & Muscle Tone: within normal limits no significant tremors or diaphoresis or psychomotor restlessness noted  Gait & Station: normal Patient leans: N/A  Psychiatric Specialty Exam: Physical Exam  ROS no chest pain, no shortness of breath , no vomiting. Reports significant weight loss over recent weeks, associated with depression   Blood pressure (!) 103/56, pulse 94, temperature 98.8 F (37.1 C), temperature source Oral,  resp. rate 18, height 5' (1.524 m), weight 113 lb (51.3 kg), last menstrual period 10/07/2016, SpO2 100 %.Body mass index is 22.07 kg/m.  General Appearance: improved grooming   Eye Contact:  Improved   Speech:  Normal Rate  Volume:  Normal  Mood: depressed , but some improvement compared to admission   Affect:  Remains constricted, but to lesser degree than on admission   Thought Process:  Linear  Orientation:  Full (Time, Place, and Person)  Thought Content:  no hallucinations, no delusions, not internally preoccupied   Suicidal Thoughts:  No at this time denies any active SI or self injurious ideations and contracts for safety on unit.   Homicidal Thoughts:  No denies violent or homicidal ideations   Memory:  recent and remote grossly intact   Judgement:   Improving   Insight:  improving  Psychomotor Activity:  improved - no gross tremors or diaphoresis noted at this time  Concentration:  Concentration: Good and Attention Span: Good  Recall:  Good  Fund of Knowledge:  Good  Language:  Good  Akathisia:  Negative  Handed:  Right  AIMS (if indicated):     Assets:  Desire for Improvement Resilience   ADL's:  Intact  Cognition:  WNL  Sleep:  Number of Hours: 6.75    Assessment -  Patient presents with partial  improvement compared to admission . This is most noticeable in improved grooming, rate of speech, eye contact , and milieu participation . She does continue to feel depressed but acknowledges improvement compared to how she felt prior to admission . Tolerating medications well, slept better last night . Remains ruminative about psychosocial stressors, but seems somewhat more optimistic and future oriented . History of alcohol abuse- no current withdrawal symptoms.  Some alcohol cravings, we discussed options and expresses interest in CAMPRAL trial.   Treatment Plan Summary: Daily contact with patient to assess and evaluate symptoms and progress in treatment, Medication management, Plan inpatient treatment  and medications as below Encourage group and milieu participation to work on coping skills and symptom reduction  Continue Lamictal 25 mgrs BID for mood disorder, depression Continue Remeron 7.5 mgrs QHS for depression and insomnia  Continue  Zyprexa  7.5 mgrs QHS for mood disorder/bipolar spectrum disorder/ may also help sleep Continue  Clonidine 0.1 mgrs QHS for PTSD type nightmares  Start Campral 666 mgrs TID for alcohol cravings Continue Vitamin  Supplementation Treatment team working disposition planning options   Neita Garnet, MD 10/17/2016, 1:46 PM   Patient ID: Jearld Lesch, female   DOB: [birth year], 40 y.o.   MRN: [id number]

## 2016-10-17 NOTE — Progress Notes (Signed)
D: Pt passive SI- contracts for safety denies HI/AVH. Pt is pleasant and cooperative. Pt stated she was upset that no one came to visit her today. Pt irritable and tries to find fault in everything mentioned to her.    A: Pt was offered support and encouragement. Pt was given scheduled medications. Pt was encourage to attend groups. Q 15 minute checks were done for safety.   R: Pt is taking medication.Pt receptive to treatment and safety maintained on unit.

## 2016-10-17 NOTE — Progress Notes (Signed)
D: Patient reports poor appetite and remains focused on medications.  She reports the iron pill is giving her problems with bowel movements.  She is observed in the day room.  She has been advocating for her peers on the unit.  She can be intrusive with staff, requesting items for others.  She continues to report passive SI with no specific plan.  Her goal today is to "get motivated to set goals on what to do with my life."  [facility], gave [facility] yesterday due to SI.  She has brighter affect today; her mood is pleasant.  She rates her depression as a 7; hopelessness and anxiety as a 6. A: Continue to monitor medication management and MD orders.  Safety checks completed every 15 minutes per protocol.  Offer support and encouragement as needed. R: Patient is receptive to staff; her behavior is appropriate.

## 2016-10-17 NOTE — [facility] Group Notes (Signed)
[facility] LCSW Group Therapy 10/17/2016 1:15pm  Type of Therapy: Group Therapy- Feelings Around Relapse and Recovery  Participation Level: Active   Participation Quality:  Appropriate  Affect:  Appropriate  Cognitive: Alert and Oriented   Insight:  Developing   Engagement in Therapy: Developing/Improving and Engaged   Modes of Intervention: Clarification, Confrontation, Discussion, Education, Exploration, Limit-setting, Orientation, Problem-solving, Rapport Building, Dance movement psychotherapisteality Testing, Socialization and Support  Summary of Progress/Problems: The topic for today was feelings about relapse. The group discussed what relapse prevention is to them and identified triggers that they are on the path to relapse. Members also processed their feeling towards relapse and were able to relate to common experiences. Group also discussed coping skills that can be used for relapse prevention.  Pt participation continues to be externally focused, particularly on her lack of family support and difficulty accessing care. Pt describes loneliness as a trigger for relapse as she feels that "no one care" or that she is not needed by anyone.    Therapeutic Modalities:   Cognitive Behavioral Therapy Solution-Focused Therapy Assertiveness Training Relapse Prevention Therapy    Damien FusiLauren Parish [place]ose, LCSW [phone] 10/17/2016 3:51 PM

## 2016-10-17 NOTE — [facility] Group Notes (Signed)
Patient attend group. 

## 2016-10-17 NOTE — Plan of Care (Signed)
Problem: Education: Goal: Emotional status will improve Outcome: Progressing Patient has brighter affect today; appears less depressed.

## 2016-10-17 NOTE — Tx Team (Signed)
Interdisciplinary Treatment and Diagnostic Plan Update  10/17/2016 Time of Session: 8:45 AM  Leslie Sanders MRN: [id number]  Principal Diagnosis: Bipolar disorder, most recent episode depressed (HCC)  Secondary Diagnoses: Principal Problem:   Bipolar disorder, most recent episode depressed (HCC) Active Problems:   Anemia, iron deficiency   Alcohol use disorder, moderate, dependence (HCC)   Lactose intolerance   Current Medications:  Current Facility-Administered Medications  Medication Dose Route Frequency Provider Last Rate Last Dose  . alum & mag hydroxide-simeth (MAALOX/MYLANTA) 200-200-20 MG/5ML suspension 30 mL  30 mL Oral Q4H PRN Thermon LeylandLaura A Davis, NP   30 mL at 10/14/16 [id number]  . cholecalciferol (VITAMIN D) tablet 1,000 Units  1,000 Units Oral Daily Craige CottaFernando A Cobos, MD   1,000 Units at 10/17/16 0751  . cloNIDine (CATAPRES) tablet 0.1 mg  0.1 mg Oral QHS Rockey SituFernando A Cobos, MD   0.1 mg at 10/16/16 2240  . docusate sodium (COLACE) capsule 100 mg  100 mg Oral Daily Beau FannyJohn C Withrow, FNP   100 mg at 10/17/16 0751  . feeding supplement (ENSURE ENLIVE) (ENSURE ENLIVE) liquid 237 mL  237 mL Oral BID BM Saramma Eappen, MD   237 mL at 10/16/16 0806  . ferrous sulfate tablet 325 mg  325 mg Oral Q breakfast Beau FannyJohn C Withrow, FNP   325 mg at 10/17/16 0751  . hydrOXYzine (ATARAX/VISTARIL) tablet 25 mg  25 mg Oral Q6H PRN Kerry HoughSpencer E Simon, PA-C   25 mg at 10/16/16 2240  . lamoTRIgine (LAMICTAL) tablet 25 mg  25 mg Oral BID Beau FannyJohn C Withrow, FNP   25 mg at 10/17/16 0750  . magnesium hydroxide (MILK OF MAGNESIA) suspension 30 mL  30 mL Oral Daily PRN Thermon LeylandLaura A Davis, NP      . mirtazapine (REMERON) tablet 7.5 mg  7.5 mg Oral QHS Kerry HoughSpencer E Simon, PA-C   7.5 mg at 10/16/16 2240  . multivitamin with minerals tablet 1 tablet  1 tablet Oral Daily Jackelyn PolingJason A Berry, NP   1 tablet at 10/17/16 0750  . nicotine (NICODERM CQ - dosed in mg/24 hours) patch 21 mg  21 mg Transdermal Daily Craige CottaFernando A Cobos, MD   21 mg at 10/17/16  0753  . OLANZapine (ZYPREXA) tablet 7.5 mg  7.5 mg Oral QHS Rockey SituFernando A Cobos, MD   7.5 mg at 10/16/16 2240  . thiamine (VITAMIN B-1) tablet 100 mg  100 mg Oral Daily Jackelyn PolingJason A Berry, NP   100 mg at 10/17/16 0751    PTA Medications: Prescriptions Prior to Admission  Medication Sig Dispense Refill Last Dose  . Carboxymethylcellul-Glycerin (CLEAR EYES FOR DRY EYES) 1-0.25 % SOLN Place 1 drop into both eyes daily as needed (dry eyes).   Past Week at Unknown time  . Cyanocobalamin (B-12) 2500 MCG TABS Take 2,500 mcg by mouth daily.  11 Past Week at Unknown time  . folic acid (FOLVITE) 1 MG tablet Take 1 mg by mouth daily.  2 Past Week at Unknown time  . sertraline (ZOLOFT) 50 MG tablet Take 50 mg by mouth daily.   09/27/2016  . traZODone (DESYREL) 100 MG tablet Take 100 mg by mouth at bedtime as needed.   Past Week at Unknown time    Treatment Modalities: Medication Management, Group therapy, Case management,  1 to 1 session with clinician, Psychoeducation, Recreational therapy.  Patient Stressors: Financial difficulties Marital or family conflict Substance abuse  Patient Strengths: Ability for insight Average or above average intelligence Capable of independent living Communication skills  Physician Treatment Plan for Primary Diagnosis: Bipolar disorder, most recent episode depressed (HCC) Long Term Goal(s): Improvement in symptoms so as ready for discharge  Short Term Goals: Ability to identify changes in lifestyle to reduce recurrence of condition will improve Ability to verbalize feelings will improve Ability to disclose and discuss suicidal ideas Ability to demonstrate self-control will improve Ability to identify and develop effective coping behaviors will improve Ability to maintain clinical measurements within normal limits will improve Compliance with prescribed medications will improve Ability to identify triggers associated with substance abuse/mental health issues will  improve Ability to identify changes in lifestyle to reduce recurrence of condition will improve Ability to verbalize feelings will improve Ability to disclose and discuss suicidal ideas Ability to demonstrate self-control will improve Ability to identify and develop effective coping behaviors will improve Ability to maintain clinical measurements within normal limits will improve Compliance with prescribed medications will improve Ability to identify triggers associated with substance abuse/mental health issues will improve  Medication Management: Evaluate patient's response, side effects, and tolerance of medication regimen.  Therapeutic Interventions: 1 to 1 sessions, Unit Group sessions and Medication administration.  Evaluation of Outcomes: Progressing  Physician Treatment Plan for Secondary Diagnosis: Principal Problem:   Bipolar disorder, most recent episode depressed (HCC) Active Problems:   Anemia, iron deficiency   Alcohol use disorder, moderate, dependence (HCC)   Lactose intolerance   Long Term Goal(s): Improvement in symptoms so as ready for discharge  Short Term Goals: Ability to identify changes in lifestyle to reduce recurrence of condition will improve Ability to verbalize feelings will improve Ability to disclose and discuss suicidal ideas Ability to demonstrate self-control will improve Ability to identify and develop effective coping behaviors will improve Ability to maintain clinical measurements within normal limits will improve Compliance with prescribed medications will improve Ability to identify triggers associated with substance abuse/mental health issues will improve Ability to identify changes in lifestyle to reduce recurrence of condition will improve Ability to verbalize feelings will improve Ability to disclose and discuss suicidal ideas Ability to demonstrate self-control will improve Ability to identify and develop effective coping behaviors will  improve Ability to maintain clinical measurements within normal limits will improve Compliance with prescribed medications will improve Ability to identify triggers associated with substance abuse/mental health issues will improve  Medication Management: Evaluate patient's response, side effects, and tolerance of medication regimen.  Therapeutic Interventions: 1 to 1 sessions, Unit Group sessions and Medication administration.  Evaluation of Outcomes: Progressing   RN Treatment Plan for Primary Diagnosis: Bipolar disorder, most recent episode depressed (HCC) Long Term Goal(s): Knowledge of disease and therapeutic regimen to maintain health will improve  Short Term Goals: Ability to verbalize feelings will improve, Ability to disclose and discuss suicidal ideas and Ability to identify and develop effective coping behaviors will improve  Medication Management: RN will administer medications as ordered by provider, will assess and evaluate patient's response and provide education to patient for prescribed medication. RN will report any adverse and/or side effects to prescribing provider.  Therapeutic Interventions: 1 on 1 counseling sessions, Psychoeducation, Medication administration, Evaluate responses to treatment, Monitor vital signs and CBGs as ordered, Perform/monitor CIWA, COWS, AIMS and Fall Risk screenings as ordered, Perform wound care treatments as ordered.  Evaluation of Outcomes: Progressing   LCSW Treatment Plan for Primary Diagnosis: Bipolar disorder, most recent episode depressed (HCC) Long Term Goal(s): Safe transition to appropriate next level of care at discharge, Engage patient in therapeutic group addressing interpersonal concerns.  Short  Term Goals: Engage patient in aftercare planning with referrals and resources, Identify triggers associated with mental health/substance abuse issues and Increase skills for wellness and recovery  Therapeutic Interventions: Assess for  all discharge needs, 1 to 1 time with Social worker, Explore available resources and support systems, Assess for adequacy in community support network, Educate family and significant other(s) on suicide prevention, Complete Psychosocial Assessment, Interpersonal group therapy.  Evaluation of Outcomes: Progressing   Progress in Treatment: Attending groups: Yes Participating in groups: Intermittently Taking medication as prescribed: Yes, MD continues to assess for medication changes as needed Toleration medication: Yes, no side effects reported at this time Family/Significant other contact made: No, Pt declines Patient understands diagnosis: Developing insight Discussing patient identified problems/goals with staff: Yes Medical problems stabilized or resolved: Yes Denies suicidal/homicidal ideation: No, endorses passive SI Issues/concerns per patient self-inventory: None Other: N/A  New problem(s) identified: None identified at this time.   New Short Term/Long Term Goal(s): None identified at this time.   Discharge Plan or Barriers: Pt will return home and follow-up with outpatient services.  [facility] referral currently pending; bed was given away due to Pt SI on 11/9. No beds until at least Monday 11/13  Reason for Continuation of Hospitalization: Anxiety Depression Medication stabilization Suicidal ideation  Estimated Length of Stay: 2-4 days  Attendees: Patient: 10/17/2016  8:45 AM  Physician: Dr. Jama Flavors 10/17/2016  8:45 AM  Nursing: Joslyn Devon, RN 10/17/2016  8:45 AM  RN Care Manager: Onnie Boer, RN 10/17/2016  8:45 AM  Social Worker: Vernie Shanks, LCSW; Kristin Drinkard, LCSW 10/17/2016  8:45 AM  Recreational Therapist:  10/17/2016  8:45 AM  Other: Gray Bernhardt, NP; Claudette Head, NP 10/17/2016  8:45 AM  Other:  10/17/2016  8:45 AM  Other: 10/17/2016  8:45 AM    Scribe for Treatment Team: Verdene Lennert, LCSW 10/17/2016 8:45 AM

## 2016-10-17 NOTE — Progress Notes (Signed)
Recreation Therapy Notes  Date: 10/17/16 Time: 0930 Location: [address] Dayroom  Group Topic: Stress Management  Goal Area(s) Addresses:  Patient will verbalize importance of using healthy stress management.  Patient will identify positive emotions associated with healthy stress management.   Intervention: Stress Management  Activity :  Progressive Muscle Relaxation.  LRT introduced the stress management technique of progressive muscle relaxation.  LRT read a script to guide patients through the technique.  Patients were to follow along as LRT read script.  Education:  Stress Management, Discharge Planning.   Education Outcome: Acknowledges edcuation/In group clarification offered/Needs additional education  Clinical Observations/Feedback: Pt did not attend  group.    Caroll RancherMarjette Daymian Lill, LRT/CTRS         Caroll RancherLindsay, Tonji Elliff A 10/17/2016 11:38 AM

## 2016-10-18 DIAGNOSIS — Z79899 Other long term (current) drug therapy: Secondary | ICD-10-CM

## 2016-10-18 DIAGNOSIS — Z818 Family history of other mental and behavioral disorders: Secondary | ICD-10-CM

## 2016-10-18 DIAGNOSIS — F3130 Bipolar disorder, current episode depressed, mild or moderate severity, unspecified: Secondary | ICD-10-CM

## 2016-10-18 DIAGNOSIS — F17210 Nicotine dependence, cigarettes, uncomplicated: Secondary | ICD-10-CM

## 2016-10-18 MED ORDER — IBUPROFEN 600 MG PO TABS
ORAL_TABLET | ORAL | Status: AC
  Administered 2016-10-18: 13:00:00
  Filled 2016-10-18: qty 1

## 2016-10-18 MED ORDER — IBUPROFEN 600 MG PO TABS
600.0000 mg | ORAL_TABLET | Freq: Three times a day (TID) | ORAL | Status: DC
  Administered 2016-10-18 – 2016-10-21 (×7): 600 mg via ORAL
  Filled 2016-10-18 (×12): qty 1

## 2016-10-18 NOTE — [facility] Group Notes (Addendum)
Adult Group Therapy Note  Date:  10/18/2016  Time: 9:00AM-10:00AM  Group Topic/Focus: Today's group focused on the topic of fear and healthy coping skills.  An exercise was performed which elicited sources of fear that various patients feel, giving an opportunity for other patients to identify with that fear.  After a discussion of each, an unhealthy coping skill and suggestions for healthy coping skills to deal with that fear were named.  These were also listed on the whiteboard and at the end of group, the participants wanted a copy of what was on the board. Reflective listening was used to help patients connect with each other on similarities rather than to focus on differences.  Participation Level:  Minimal  Participation Quality:  Drowsy  Affect:  Flat  Cognitive:  Appropriate  Insight: Improving  Engagement in Group:  Improving  Modes of Intervention:  Activity, Discussion and Support  Additional Comments:  The patient expressed that reading is something that she is discovering is a healthy means to cope.  She was very interactive with the group at times, at other times had her eyes closed and was nonresponsive.  She said she was sedated on new medicines.  Carloyn JaegerMareida J Grossman-Orr 10/18/2016 , 12:25 PM

## 2016-10-18 NOTE — Progress Notes (Signed)
Leslie Sanders. Leslie Sanders had been up and visible in milieu this evening, did attend evening group activity. Leslie Sanders was seen interacting appropriately with peers, spoke about feeling ok, still depressed and has appeared anxious at times this evening. Leslie Sanders did receive all bedtime medications without incident and did not verbalize any complaints of pain. A. Support and encouragement provided. R. Safety maintained, will continue to monitor.

## 2016-10-18 NOTE — Progress Notes (Signed)
[facility]  10/18/2016 12:21 PM Girtha Rmaula M Alewine  MRN:  [id number] Subjective:  "I feel really depressed and my ankle hurts. But my sleep is fantastic."   Objective: Pt seen and chart reviewed. Pt is alert/oriented x4, calm, cooperative, and appropriate to situation. Pt denies homicidal ideation and psychosis and does not appear to be responding to internal stimuli. Pt continues to report suicidal ideation without plan. Pt reports that she twisted her right ankle and that it hurts. Thorough examination was negative for concerns for fracture or tendon rupture; probable metatarsal ligament strain to dorsal region of right foot. Will not wrap so as to avoid change in gait with overcompensation (no swelling, no erythema, no TTP). Will start ibuprofen 600mg  po tid. Pt has high AST but not ALT and ibuprofen will not affect the AST (only ALT).     Principal Problem: Bipolar disorder, most recent episode depressed (HCC) Diagnosis:   Patient Active Problem List   Diagnosis Date Noted  . Bipolar disorder, most recent episode depressed (HCC) [F31.30] 10/12/2016  . Anemia, iron deficiency [D50.9] 10/12/2016  . Alcohol use disorder, moderate, dependence (HCC) [F10.20] 10/12/2016  . Lactose intolerance [E73.9] 10/12/2016   Total Time spent with patient: 15 minutes  Past Medical History:  Past Medical History:  Diagnosis Date  . Depression    History reviewed. No pertinent surgical history. Family History:  Family History  Problem Relation Age of Onset  . Schizophrenia Maternal Aunt   . ADD / ADHD Son   . ODD Son     Social History:  History  Alcohol Use  . 16.8 oz/week  . 28 Cans of beer per week     History  Drug Use No    Social History   Social History  . Marital status: Divorced    Spouse name: N/A  . Number of children: N/A  . Years of education: N/A   Social History Main Topics  . Smoking status: Current Every Day Smoker    Packs/day: 0.50    Types: Cigarettes  .  Smokeless tobacco: Never Used  . Alcohol use 16.8 oz/week    28 Cans of beer per week  . Drug use: No  . Sexual activity: Not Currently   Other Topics Concern  . None   Social History Narrative  . None   Additional Social History:    Pain Medications: Denies Prescriptions: Denies Over the Counter: Denies History of alcohol / drug use?: Yes Longest period of sobriety (when/how long): 1.5 weeks Negative Consequences of Use: Financial, Personal relationships Withdrawal Symptoms: Nausea / Vomiting, Irritability, Sweats, Tremors Name of Substance 1: Alcohol 1 - Age of First Use: "in my 4030's" 1 - Amount (size/oz): 6-7 beers 1 - Frequency: 3-4 times a week 1 - Duration: "about 2 years"  1 - Last Use / Amount: yesterday afternoon/6 beers  Sleep: Good  Appetite:  Good  Current Medications: Current Facility-Administered Medications  Medication Dose Route Frequency Provider Last Rate Last Dose  . acamprosate (CAMPRAL) tablet 666 mg  666 mg Oral TID WC Craige CottaFernando A Cobos, MD   666 mg at 10/18/16 0912  . alum & mag hydroxide-simeth (MAALOX/MYLANTA) 200-200-20 MG/5ML suspension 30 mL  30 mL Oral Q4H PRN Thermon LeylandLaura A Davis, NP   30 mL at 10/14/16 [id number]  . cholecalciferol (VITAMIN D) tablet 1,000 Units  1,000 Units Oral Daily Craige CottaFernando A Cobos, MD   1,000 Units at 10/18/16 0912  . cloNIDine (CATAPRES) tablet 0.1 mg  0.1  mg Oral QHS Rockey Situ Cobos, MD   0.1 mg at 10/17/16 2306  . docusate sodium (COLACE) capsule 100 mg  100 mg Oral Daily Beau Fanny, FNP   100 mg at 10/18/16 0912  . feeding supplement (ENSURE ENLIVE) (ENSURE ENLIVE) liquid 237 mL  237 mL Oral BID BM Saramma Eappen, MD   237 mL at 10/18/16 1000  . ferrous sulfate tablet 325 mg  325 mg Oral Q breakfast Beau Fanny, FNP   325 mg at 10/18/16 0912  . ibuprofen (ADVIL,MOTRIN) tablet 600 mg  600 mg Oral TID Beau Fanny, FNP      . lamoTRIgine (LAMICTAL) tablet 25 mg  25 mg Oral BID Beau Fanny, FNP   25 mg at 10/18/16 0912   . magnesium hydroxide (MILK OF MAGNESIA) suspension 30 mL  30 mL Oral Daily PRN Thermon Leyland, NP      . mirtazapine (REMERON) tablet 7.5 mg  7.5 mg Oral QHS Kerry Hough, PA-C   7.5 mg at 10/17/16 2306  . multivitamin with minerals tablet 1 tablet  1 tablet Oral Daily Jackelyn Poling, NP   1 tablet at 10/18/16 0912  . naphazoline-glycerin (CLEAR EYES) ophth solution 1-2 drop  1-2 drop Both Eyes QID PRN Craige Cotta, MD   2 drop at 10/17/16 1641  . nicotine (NICODERM CQ - dosed in mg/24 hours) patch 21 mg  21 mg Transdermal Daily Rockey Situ Cobos, MD   21 mg at 10/18/16 0800  . OLANZapine (ZYPREXA) tablet 7.5 mg  7.5 mg Oral QHS Rockey Situ Cobos, MD   7.5 mg at 10/17/16 2306  . thiamine (VITAMIN B-1) tablet 100 mg  100 mg Oral Daily Jackelyn Poling, NP   100 mg at 10/18/16 1610    Lab Results:  No results found for this or any previous visit (from the past 48 hour(s)).  Blood Alcohol level:  Lab Results  Component Value Date   ETH 65 (H) 10/11/2016    Metabolic Disorder Labs: No results found for: HGBA1C, MPG No results found for: PROLACTIN No results found for: CHOL, TRIG, HDL, CHOLHDL, VLDL, LDLCALC  Physical Findings: AIMS: Facial and Oral Movements Muscles of Facial Expression: None, normal Lips and Perioral Area: None, normal Jaw: None, normal Tongue: None, normal,Extremity Movements Upper (arms, wrists, hands, fingers): None, normal Lower (legs, knees, ankles, toes): None, normal, Trunk Movements Neck, shoulders, hips: None, normal, Overall Severity Severity of abnormal movements (highest score from questions above): None, normal Incapacitation due to abnormal movements: None, normal Patient's awareness of abnormal movements (rate only patient's report): No Awareness, Dental Status Current problems with teeth and/or dentures?: No Does patient usually wear dentures?: No  CIWA:  CIWA-Ar Total: 1 COWS:     Musculoskeletal: Strength & Muscle Tone: within normal limits  no significant tremors or diaphoresis or psychomotor restlessness noted  Gait & Station: normal Patient leans: N/A  Psychiatric Specialty Exam: Physical Exam  Review of Systems  Musculoskeletal: Positive for joint pain (right ankle; Thorough examination was negative for concerns for fracture or tendon rupture; probable metatarsal ligament strain to dorsal region of right foot. ).  Psychiatric/Behavioral: Positive for depression, substance abuse and suicidal ideas. Negative for hallucinations. The patient is nervous/anxious and has insomnia.   All other systems reviewed and are negative.  no chest pain, no shortness of breath , no vomiting. Reports significant weight loss over recent weeks, associated with depression   Blood pressure 100/64, pulse 83, temperature  97.9 F (36.6 C), temperature source Oral, resp. rate 18, height 5' (1.524 m), weight 51.3 kg (113 lb), last menstrual period 10/07/2016, SpO2 100 %.Body mass index is 22.07 kg/m.  General Appearance: casual, fairly groomed  Eye Contact:  Good  Speech:  Normal Rate, clear, coherent  Volume:  Normal  Mood: depressed with objective improvement; pt reports worsening  Affect: Congruent, depressed  Thought Process:  Linear, logical, goal-directed  Orientation:  Full (Time, Place, and Person)  Thought Content:  no hallucinations, no delusions, not internally preoccupied focused on her ankle  Suicidal Thoughts:  No at this time denies any active SI or self injurious ideations and contracts for safety on unit.   Homicidal Thoughts:  No denies violent or homicidal ideations   Memory:  recent and remote grossly intact   Judgement:   Improving   Insight:  improving  Psychomotor Activity:  improved - no gross tremors or diaphoresis noted at this time  Concentration:  Concentration: Good and Attention Span: Good  Recall:  Good  Fund of Knowledge:  Good  Language:  Good  Akathisia:  No  Handed:  Right  AIMS (if indicated):     Assets:   Desire for Improvement Resilience  ADL's:  Intact  Cognition:  WNL  Sleep:  Number of Hours: 5.5   Treatment Plan Summary: Bipolar disorder, most recent episode depressed (HCC) unstable yet improving, treated as below:  Daily contact with patient to assess and evaluate symptoms and progress in treatment, Medication management, Plan inpatient treatment  and medications as below Encourage group and milieu participation to work on coping skills and symptom reduction  Continue Lamictal 25 mgrs BID for mood disorder, depression Continue Remeron 7.5 mgrs QHS for depression and insomnia  Continue  Zyprexa  7.5 mgrs QHS for mood disorder/bipolar spectrum disorder/ may also help sleep Continue  Clonidine 0.1 mgrs QHS for PTSD type nightmares  Continue Campral 666 mgrs TID for alcohol cravings -Start Ibuprofen 600mg  po tid x 4 days for ankle swelling Continue Vitamin  Supplementation Treatment team working disposition planning options  I reviewed chart and agreed with the findings and treatment Plan.  Kathryne SharperSyed Danial Sisley, MD  Beau FannyWithrow, John C, FNP 10/18/2016, 12:21 PM

## 2016-10-18 NOTE — Progress Notes (Signed)
[name] has spent most of her day in the dayroom. SHe interacts appropriately with her peers and she completes her daily assessment and on it she wrote she has experienced SI today , she contracts to stay safe and she rates her depression, hopelessness and anxiety " 7/6/6" . A NP assessed pt's ankle and determined ibuprofen will help with inflammation and pt agrees thus far. R Safety in place.

## 2016-10-18 NOTE — Progress Notes (Signed)
Patient ID: Leslie Sanders, female   DOB: 01-20-1976, 40 y.o.   MRN: [id number]  Pt currently presents with a flat affect and guarded, submissive behavior. Pt reports to writer that their goal is to "keep taking my medications and sleep better." Pt states it was an okay day today, my family still hasn't visited me which makes me sad." Pt reports good sleep with current medication regimen.   Pt provided with medications per providers orders. Pt's labs and vitals were monitored throughout the night. Pt supported emotionally and encouraged to express concerns and questions. Pt educated on medications.  Pt's safety ensured with 15 minute and environmental checks. Pt currently denies SI/HI and A/V hallucinations. Pt verbally agrees to seek staff if SI/HI or A/VH occurs and to consult with staff before acting on any harmful thoughts. Will continue POC.

## 2016-10-19 MED ORDER — PRAZOSIN HCL 1 MG PO CAPS
1.0000 mg | ORAL_CAPSULE | Freq: Every day | ORAL | Status: DC
  Administered 2016-10-19 – 2016-10-20 (×2): 1 mg via ORAL
  Filled 2016-10-19 (×5): qty 1

## 2016-10-19 NOTE — Progress Notes (Signed)
[facility] MD Progress Note  10/19/2016 2:14 PM Leslie Sanders  MRN:  [id number] Subjective:  "I feel much better today. I'm interacting with people in the Dayroom and that really helps me a lot. My ankle feels a lot better also."  Objective: Pt seen and chart reviewed. Pt is alert/oriented x4, calm, cooperative, and appropriate to situation. Pt denies suicidal/homicidal ideation and psychosis and does not appear to be responding to internal stimuli. Pt reports that her ankle is doing much better and that the ibuprofen is helping greatly. She also reports that she has been spending time in groups and that this has helped her a lot in terms of improvement in depression and anxiety.  Principal Problem: Bipolar disorder, most recent episode depressed (HCC) Diagnosis:   Patient Active Problem List   Diagnosis Date Noted  . Bipolar disorder, most recent episode depressed (HCC) [F31.30] 10/12/2016  . Anemia, iron deficiency [D50.9] 10/12/2016  . Alcohol use disorder, moderate, dependence (HCC) [F10.20] 10/12/2016  . Lactose intolerance [E73.9] 10/12/2016   Total Time spent with patient: 15 minutes  Past Medical History:  Past Medical History:  Diagnosis Date  . Depression    History reviewed. No pertinent surgical history. Family History:  Family History  Problem Relation Age of Onset  . Schizophrenia Maternal Aunt   . ADD / ADHD Son   . ODD Son     Social History:  History  Alcohol Use  . 16.8 oz/week  . 28 Cans of beer per week     History  Drug Use No    Social History   Social History  . Marital status: Divorced    Spouse name: N/A  . Number of children: N/A  . Years of education: N/A   Social History Main Topics  . Smoking status: Current Every Day Smoker    Packs/day: 0.50    Types: Cigarettes  . Smokeless tobacco: Never Used  . Alcohol use 16.8 oz/week    28 Cans of beer per week  . Drug use: No  . Sexual activity: Not Currently   Other Topics Concern  . None    Social History Narrative  . None   Additional Social History:    Pain Medications: Denies Prescriptions: Denies Over the Counter: Denies History of alcohol / drug use?: Yes Longest period of sobriety (when/how long): 1.5 weeks Negative Consequences of Use: Financial, Personal relationships Withdrawal Symptoms: Nausea / Vomiting, Irritability, Sweats, Tremors Name of Substance 1: Alcohol 1 - Age of First Use: "in my 40's" 1 - Amount (size/oz): 6-7 beers 1 - Frequency: 3-4 times a week 1 - Duration: "about 2 years"  1 - Last Use / Amount: yesterday afternoon/6 beers  Sleep: Good  Appetite:  Good  Current Medications: Current Facility-Administered Medications  Medication Dose Route Frequency Provider Last Rate Last Dose  . acamprosate (CAMPRAL) tablet 666 mg  666 mg Oral TID WC Craige Cotta, MD   666 mg at 10/19/16 1132  . alum & mag hydroxide-simeth (MAALOX/MYLANTA) 200-200-20 MG/5ML suspension 30 mL  30 mL Oral Q4H PRN Thermon Leyland, NP   30 mL at 10/14/16 1191  . cholecalciferol (VITAMIN D) tablet 1,000 Units  1,000 Units Oral Daily Craige Cotta, MD   1,000 Units at 10/19/16 [phone]  . docusate sodium (COLACE) capsule 100 mg  100 mg Oral Daily Beau Fanny, FNP   100 mg at 10/19/16 9562  . feeding supplement (ENSURE ENLIVE) (ENSURE ENLIVE) liquid 237 mL  237 mL  Oral BID BM Saramma Eappen, MD   237 mL at 10/19/16 1027  . ferrous sulfate tablet 325 mg  325 mg Oral Q breakfast Beau FannyJohn C Dorisann Schwanke, FNP   325 mg at 10/19/16 [id number]  . ibuprofen (ADVIL,MOTRIN) tablet 600 mg  600 mg Oral TID Beau FannyJohn C Satya Bohall, FNP   600 mg at 10/18/16 1812  . lamoTRIgine (LAMICTAL) tablet 25 mg  25 mg Oral BID Beau FannyJohn C Jonel Weldon, FNP   25 mg at 10/19/16 [id number]  . magnesium hydroxide (MILK OF MAGNESIA) suspension 30 mL  30 mL Oral Daily PRN Thermon LeylandLaura A Davis, NP      . mirtazapine (REMERON) tablet 7.5 mg  7.5 mg Oral QHS Kerry HoughSpencer E Simon, PA-C   7.5 mg at 10/18/16 2254  . multivitamin with minerals tablet 1 tablet   1 tablet Oral Daily Jackelyn PolingJason A Berry, NP   1 tablet at 10/19/16 [id number]  . naphazoline-glycerin (CLEAR EYES) ophth solution 1-2 drop  1-2 drop Both Eyes QID PRN Craige CottaFernando A Cobos, MD   2 drop at 10/19/16 [id number]  . nicotine (NICODERM CQ - dosed in mg/24 hours) patch 21 mg  21 mg Transdermal Daily Craige CottaFernando A Cobos, MD   21 mg at 10/19/16 0832  . OLANZapine (ZYPREXA) tablet 7.5 mg  7.5 mg Oral QHS Rockey SituFernando A Cobos, MD   7.5 mg at 10/18/16 2253  . thiamine (VITAMIN B-1) tablet 100 mg  100 mg Oral Daily Jackelyn PolingJason A Berry, NP   100 mg at 10/19/16 [id number]    Lab Results:  No results found for this or any previous visit (from the past 48 hour(s)).  Blood Alcohol level:  Lab Results  Component Value Date   ETH 65 (H) 10/11/2016    Metabolic Disorder Labs: No results found for: HGBA1C, MPG No results found for: PROLACTIN No results found for: CHOL, TRIG, HDL, CHOLHDL, VLDL, LDLCALC  Physical Findings: AIMS: Facial and Oral Movements Muscles of Facial Expression: None, normal Lips and Perioral Area: None, normal Jaw: None, normal Tongue: None, normal,Extremity Movements Upper (arms, wrists, hands, fingers): None, normal Lower (legs, knees, ankles, toes): None, normal, Trunk Movements Neck, shoulders, hips: None, normal, Overall Severity Severity of abnormal movements (highest score from questions above): None, normal Incapacitation due to abnormal movements: None, normal Patient's awareness of abnormal movements (rate only patient's report): No Awareness, Dental Status Current problems with teeth and/or dentures?: No Does patient usually wear dentures?: No  CIWA:  CIWA-Ar Total: 1 COWS:     Musculoskeletal: Strength & Muscle Tone: within normal limits no significant tremors or diaphoresis or psychomotor restlessness noted  Gait & Station: normal Patient leans: N/A  Psychiatric Specialty Exam: Physical Exam  Review of Systems  Musculoskeletal: Positive for joint pain (right ankle; Thorough  examination was negative for concerns for fracture or tendon rupture; probable metatarsal ligament strain to dorsal region of right foot. ).  Psychiatric/Behavioral: Positive for depression, substance abuse and suicidal ideas. Negative for hallucinations. The patient is nervous/anxious and has insomnia.   All other systems reviewed and are negative.  no chest pain, no shortness of breath , no vomiting. Reports significant weight loss over recent weeks, associated with depression   Blood pressure (!) 86/56, pulse 75, temperature 97.5 F (36.4 C), temperature source Oral, resp. rate 16, height 5' (1.524 m), weight 51.3 kg (113 lb), last menstrual period 10/07/2016, SpO2 100 %.Body mass index is 22.07 kg/m.  General Appearance: casual, fairly groomed  Eye Contact:  Good  Speech:  Normal Rate, clear,  coherent  Volume:  Normal  Mood: depressed with objective improvement; pt reports worsening  Affect: Congruent, depressed  Thought Process:  Linear, logical, goal-directed  Orientation:  Full (Time, Place, and Person)  Thought Content:  no hallucinations, no delusions, not internally preoccupied focused on her ankle  Suicidal Thoughts:  No at this time denies any active SI or self injurious ideations and contracts for safety on unit.   Homicidal Thoughts:  No denies violent or homicidal ideations   Memory:  recent and remote grossly intact   Judgement:   Improving   Insight:  improving  Psychomotor Activity:  improved - no gross tremors or diaphoresis noted at this time  Concentration:  Concentration: Good and Attention Span: Good  Recall:  Good  Fund of Knowledge:  Good  Language:  Good  Akathisia:  No  Handed:  Right  AIMS (if indicated):     Assets:  Desire for Improvement Resilience  ADL's:  Intact  Cognition:  WNL  Sleep:  Number of Hours: 6   Treatment Plan Summary: Bipolar disorder, most recent episode depressed (HCC) unstable yet improving, treated as below; reviewed on 10/19/16  and pt is improving; will continue current plan with changes in bold  Daily contact with patient to assess and evaluate symptoms and progress in treatment, Medication management, Plan inpatient treatment  and medications as below Encourage group and milieu participation to work on coping skills and symptom reduction  Continue Lamictal 25 mgrs BID for mood disorder, depression Continue Remeron 7.5 mgrs QHS for depression and insomnia  Continue  Zyprexa  7.5 mgrs QHS for mood disorder/bipolar spectrum disorder/ may also help sleep -Discontinue clonidine (severe hypotension 86/56), will switch to prazosin 1mg  po qhs Continue Campral 666 mgrs TID for alcohol cravings -Continue Ibuprofen 600mg  po tid x 4 days for ankle swelling Continue Vitamin  Supplementation Treatment team working disposition planning options  I reviewed chart and agreed with the findings and treatment Plan.  Beau FannyWithrow, Mady Oubre C, FNP 10/19/2016, 2:14 PM

## 2016-10-19 NOTE — Progress Notes (Signed)
Patient ID: Leslie Sanders, female   DOB: [birth year], 40 y.o.   MRN: [id number]  DAR: Pt. Denies SI/HI and A/V Hallucinations. She reports sleep is good, appetite is good, energy level is normal, and concentration is good. She rates depression 7/10, hopelessness 5/10, and anxiety 5/10. Patient does not report any pain or discomfort at this time. Support and encouragement provided to the patient. Scheduled medications administered to patient per physician's orders except for Ibuprofen which patient refused. Patient is guarded but answers writers questions. She is seen in the milieu interacting with her peers appropriately. Q15 minute checks are maintained for safety.

## 2016-10-19 NOTE — [facility] Group Notes (Signed)
[facility] Group Notes:  Healthy Support Systems   Date:  10/19/2016  Time:  6:40 PM  Type of Therapy:  Psychoeducational Skills  Participation Level:  Active  Participation Quality:  Appropriate  Affect:  Blunted  Cognitive:  Appropriate  Insight:  Lacking and Limited  Engagement in Group:  Improving  Modes of Intervention:  Discussion, Education and Exploration  Summary of Progress/Problems: Patient attended group and was engaged. She did tend to say "but if" as a means to develop scenarios which the examples the speaker were giving would not work. Speaker with patient through these "but if" moments.  Marzetta BoardDopson, Skylynn Burkley E 10/19/2016, 6:40 PM

## 2016-10-19 NOTE — Progress Notes (Signed)
Patient ID: Leslie Sanders, female   DOB: 09-Dec-1975, 40 y.o.   MRN: [id number]  Pt currently presents with a flat affect and guarded behavior. Pt reports to writer that one of her goals at discharge is to "find more support when I go home." Pt states "None of my family has come to see me here even though it was their idea for me to come." Pt reports good sleep with current medication regimen.   Pt provided with medications per providers orders. Pt's labs and vitals were monitored throughout the night. Pt supported emotionally and encouraged to express concerns and questions. Pt educated on medications.  Pt's safety ensured with 15 minute and environmental checks. Pt currently denies SI/HI and A/V hallucinations. Pt verbally agrees to seek staff if SI/HI or A/VH occurs and to consult with staff before acting on any harmful thoughts. Will continue POC.

## 2016-10-19 NOTE — [facility] Counselor (Signed)
At patient's request, CSW provided clothing to patient from the clothing closet, including pants, had already received sweater.  Carloyn JaegerMareida J Grossman-Orr 10/19/2016 5:05 PM

## 2016-10-19 NOTE — [facility] Group Notes (Signed)
[facility] Group Notes:  (Nursing/MHT/Case Management/Adjunct)  Date:  10/18/2016  Time:  1300  Type of Therapy:  Nurse Education  /  Life Skills :  The group focuses on teaching patients how to identify their needs and then how to develop skills needed to get them met.  Participation Level:  Active  Participation Quality:  Attentive  Affect:  Appropriate  Cognitive:  Alert  Insight:  Appropriate  Engagement in Group:  Engaged  Modes of Intervention:  Education  Summary of Progress/Problems:  Lauralyn Primes 10/19/2016, 9:28 AM

## 2016-10-19 NOTE — Plan of Care (Signed)
Problem: Self-Concept: Goal: Ability to verbalize positive feelings about self will improve Outcome: Progressing Pt remarks about self and situation are less negative than before. Able to report positive things about self and interactions with peers.

## 2016-10-19 NOTE — Progress Notes (Signed)
Patient attendee group an participated.

## 2016-10-19 NOTE — [facility] Group Notes (Signed)
Adult Therapy Group Note  Date:  10/19/2016 Time:  9:00-10:00AM  Group Topic/Focus:  Today's group focused on patients' feelings about being consumers of mental health services over the course of their lifetimes.  There was a healthy sharing of disappointments they have experienced, as well as what they think could be helpful to them.  Various group members spoke up with complaints and CSW attempted to address these in general terms.  Support was provided among group members and from CSW to explore ways to express their needs to mental health professionals.  Participation Level:  Active  Participation Quality:  Attentive and Sharing and Supportive  Affect:  Blunted  Cognitive:  Appropriate  Insight: Improving  Engagement in Group:  Engaged  Modes of Intervention:  Discussion  Additional Comments: During group, patient expressed anger at the discrepancy between staff members, feeling that when one staff member will do something and another won't, she is being lied to.  She spoke up in support of others frequently.  Lynnell ChadMareida J Grossman-Orr, LCSW 10/19/2016, 11:48 AM

## 2016-10-20 MED ORDER — LAMOTRIGINE 25 MG PO TABS
50.0000 mg | ORAL_TABLET | Freq: Every day | ORAL | Status: DC
  Administered 2016-10-20: 50 mg via ORAL
  Filled 2016-10-20 (×3): qty 2

## 2016-10-20 MED ORDER — LAMOTRIGINE 25 MG PO TABS
25.0000 mg | ORAL_TABLET | Freq: Every morning | ORAL | Status: DC
  Administered 2016-10-21: 25 mg via ORAL
  Filled 2016-10-20 (×3): qty 1

## 2016-10-20 NOTE — Plan of Care (Signed)
Problem: Safety: Goal: Ability to identify and utilize support systems that promote safety will improve Outcome: Progressing Gunnar Fusiaula reports friends are a positive support for her

## 2016-10-20 NOTE — [facility] Group Notes (Signed)
[facility] LCSW Group Therapy  10/20/2016 1:15pm  Type of Therapy:  Group Therapy vercoming Obstacles  Participation Level:  Minimal  Participation Quality:  Withdrawn  Affect:  Flat  Cognitive:  Appropriate and Oriented  Insight:  Limited but beginning to develop  Engagement in Therapy:  Improving  Modes of Intervention:  Discussion, Exploration, Problem-solving and Support  Description of Group:   In this group patients will be encouraged to explore what they see as obstacles to their own wellness and recovery. They will be guided to discuss their thoughts, feelings, and behaviors related to these obstacles. The group will process together ways to cope with barriers, with attention given to specific choices patients can make. Each patient will be challenged to identify changes they are motivated to make in order to overcome their obstacles. This group will be process-oriented, with patients participating in exploration of their own experiences as well as giving and receiving support and challenge from other group members.  Summary of Patient Progress: Pt participated minimally in group discussion, and her few comments continue to be negative in nature regarding her situation. Pt describes having no clarity and hoping she can "dream it up."   Therapeutic Modalities:   Cognitive Behavioral Therapy Solution Focused Therapy Motivational Interviewing Relapse Prevention Therapy   Vernie ShanksLauren Malyia Moro, LCSW 10/20/2016 2:56 PM

## 2016-10-20 NOTE — Progress Notes (Signed)
Recreation Therapy Notes  Date: 10/20/16 Time: 0930 Location: [address] Dayroom  Group Topic: Stress Management  Goal Area(s) Addresses:  Patient will verbalize importance of using healthy stress management.  Patient will identify positive emotions associated with healthy stress management.   Intervention: Stress Management  Activity :  Peaceful Waves.  LRT introduced to the stress management technique of guided imagery.  LRT read a script to engage patients in the activity.  Patients were to follow along as LRT read script to participate in activity.  Education:  Stress Management, Discharge Planning.   Education Outcome: Acknowledges edcuation/In group clarification offered/Needs additional education  Clinical Observations/Feedback: Pt did not attend group.    Leslie Sanders, LRT/CTRS         Bartt Gonzaga A 10/20/2016 12:31 PM 

## 2016-10-20 NOTE — Progress Notes (Signed)
Patient ID: Leslie Sanders, female   DOB: Oct 02, 1976, 40 y.o.   MRN: [id number] D: client visible on the unit, spent most of the evening off and on the phone, interaction with writer was minimal. Goal: "to plan my discharge" Client reports "a friend" as support system. A: Writer encouraged client to report any concerns, medications reviewed, administered as ordered. Staff will monitor q2115min for safety. R: Client is safe on the unit, attended group.

## 2016-10-20 NOTE — [facility] Group Notes (Signed)
Pt attended group. Group was about wellness and what is one thing you can do to improve your well being? Pt stated have a better support group and not worry so much.

## 2016-10-20 NOTE — Progress Notes (Signed)
Patient ID: Leslie Sanders, female   DOB: 10/18/1976, 40 y.o.   MRN: [id number]  DAR: Pt. Denies SI/HI and A/V Hallucinations but is able to contract for safety if feeling unsafe. She reports sleep is good, appetite is fair, energy level is low, and concentration is good. She rates depression 7/10, hopelessness 7/10, and anxiety 6/10. Patient does not report any pain or discomfort at this time. Support and encouragement provided to the patient. Scheduled medications administered to patient per physician's order. Patient is minimal with Clinical research associatewriter but appears very interactive with her peers. She is attending groups and participating in the milieu. Q15 minute checks are maintained for safety.

## 2016-10-20 NOTE — Progress Notes (Signed)
[facility] Group Notes:  (Nursing/MHT/Case Management/Adjunct)  Date:  10/20/2016  Time:  12:06 AM  Type of Therapy:  Psychoeducational Skills  Participation Level:  Active  Participation Quality:  Attentive  Affect:  Depressed  Cognitive:  Appropriate  Insight:  Appropriate  Engagement in Group:  Resistant  Modes of Intervention:  Education  Summary of Progress/Problems: The patient was initially hesitant to respond in group due in part to the fact that she did not have any visitors this evening. The patient eventually brightened when she spoke of having had a visitor the other day and that she hopes that this friend of hers will be supportive after she is discharged from the hospital. She was also upbeat when she talked about the groups on the unit and that she had a good talk with her roommate. She does not presently have a support system in place (theme for the day).   Prezley Qadir S 10/20/2016, 12:06 AM

## 2016-10-20 NOTE — Progress Notes (Signed)
Patient ID: Leslie Sanders, female   DOB: 08-09-1976, 40 y.o.   MRN: [id number] PER STATE REGULATIONS 482.30  THIS CHART WAS REVIEWED FOR MEDICAL NECESSITY WITH RESPECT TO THE PATIENT'S ADMISSION/ DURATION OF STAY.  NEXT REVIEW DATE:   10/24/2016  Willa RoughJENNIFER JONES Essam Lowdermilk, RN, BSN CASE MANAGER

## 2016-10-20 NOTE — Progress Notes (Addendum)
[facility] MD Progress Note  10/20/2016 12:36 PM Leslie Sanders  MRN:  [id number] Subjective:  Patient describes partial improvement of mood, but continues to feel depressed, and states she easily feels overwhelmed. She denies suicidal ideations, and is future oriented- is hoping to go to an alcohol rehab program after discharge, but states she cannot miss an upcoming meeting with her son's foster care staff, foster mother, in order to decide process by which he will gradually transition back home to her. Denies medication side effects. Describes some cravings for alcohol,but high level of motivation in remaining sober .  Objective: I have reviewed case with treatment team and have met with patient . Patient remains depressed, anxious, although has improved compared to admission . She ruminates about upcoming meeting about her son. Patient reports significant history of alcohol abuse, and feels alcohol abuse has been playing a significant role in her mood disorder and difficulties functioning in daily activities. As noted, BAL was 65 on admission .  At this time no residual symptoms of alcohol WDL- describes some cravings to drink , but a desire to maintain sobriety and to focus on early recovery and relapse prevention efforts. Currently on Campral trial. No disruptive or agitated behaviors on unit, going to some groups. Remains anxious about stressors, but is future oriented - affect improves partially with support, encouragement, review of progress and coping skills  Denies medication side effects. As per chart , had been focused on ankle pain, discomfort on weekend- today does not report or appear focused on this issue- appears calm, in no acute distress or discomfort. Currently on Minipress for nightmares- BP readings improved , currently 105/69. Denies dizziness or lightheadedness at this time.  Principal Problem: Alcohol Dependence  Diagnosis:   Patient Active Problem List   Diagnosis Date Noted   . Bipolar disorder, most recent episode depressed ([place]) [F31.30] 10/12/2016  . Anemia, iron deficiency [D50.9] 10/12/2016  . Alcohol use disorder, moderate, dependence ([place]) [F10.20] 10/12/2016  . Lactose intolerance [E73.9] 10/12/2016   Total Time spent with patient: 20  minutes  Past Medical History:  Past Medical History:  Diagnosis Date  . Depression    History reviewed. No pertinent surgical history. Family History:  Family History  Problem Relation Age of Onset  . Schizophrenia Maternal Aunt   . ADD / ADHD Son   . ODD Son     Social History:  History  Alcohol Use  . 16.8 oz/week  . 28 Cans of beer per week     History  Drug Use No    Social History   Social History  . Marital status: Divorced    Spouse name: N/A  . Number of children: N/A  . Years of education: N/A   Social History Main Topics  . Smoking status: Current Every Day Smoker    Packs/day: 0.50    Types: Cigarettes  . Smokeless tobacco: Never Used  . Alcohol use 16.8 oz/week    28 Cans of beer per week  . Drug use: No  . Sexual activity: Not Currently   Other Topics Concern  . None   Social History Narrative  . None   Additional Social History:    Pain Medications: Denies Prescriptions: Denies Over the Counter: Denies History of alcohol / drug use?: Yes Longest period of sobriety (when/how long): 1.5 weeks Negative Consequences of Use: Financial, Personal relationships Withdrawal Symptoms: Nausea / Vomiting, Irritability, Sweats, Tremors Name of Substance 1: Alcohol 1 - Age of First Use: "  in my 30's" 1 - Amount (size/oz): 6-7 beers 1 - Frequency: 3-4 times a week 1 - Duration: "about 2 years"  1 - Last Use / Amount: yesterday afternoon/6 beers  Sleep: Good  Appetite:  Good  Current Medications: Current Facility-Administered Medications  Medication Dose Route Frequency Provider Last Rate Last Dose  . acamprosate (CAMPRAL) tablet 666 mg  666 mg Oral TID WC Jenne Campus,  MD   666 mg at 10/20/16 1148  . alum & mag hydroxide-simeth (MAALOX/MYLANTA) 200-200-20 MG/5ML suspension 30 mL  30 mL Oral Q4H PRN Niel Hummer, NP   30 mL at 10/14/16 1638  . cholecalciferol (VITAMIN D) tablet 1,000 Units  1,000 Units Oral Daily Jenne Campus, MD   1,000 Units at 10/20/16 [phone]  . docusate sodium (COLACE) capsule 100 mg  100 mg Oral Daily Benjamine Mola, FNP   100 mg at 10/20/16 4680  . feeding supplement (ENSURE ENLIVE) (ENSURE ENLIVE) liquid 237 mL  237 mL Oral BID BM Saramma Eappen, MD   237 mL at 10/19/16 1027  . ferrous sulfate tablet 325 mg  325 mg Oral Q breakfast Benjamine Mola, FNP   325 mg at 10/20/16 3212  . ibuprofen (ADVIL,MOTRIN) tablet 600 mg  600 mg Oral TID Benjamine Mola, FNP   600 mg at 10/20/16 1148  . lamoTRIgine (LAMICTAL) tablet 25 mg  25 mg Oral q morning - 10a Myer Peer Markela Wee, MD      . lamoTRIgine (LAMICTAL) tablet 50 mg  50 mg Oral QHS Zenda Herskowitz A Anupama Piehl, MD      . magnesium hydroxide (MILK OF MAGNESIA) suspension 30 mL  30 mL Oral Daily PRN Niel Hummer, NP      . mirtazapine (REMERON) tablet 7.5 mg  7.5 mg Oral QHS Laverle Hobby, PA-C   7.5 mg at 10/19/16 2253  . multivitamin with minerals tablet 1 tablet  1 tablet Oral Daily Rozetta Nunnery, NP   1 tablet at 10/20/16 0839  . naphazoline-glycerin (CLEAR EYES) ophth solution 1-2 drop  1-2 drop Both Eyes QID PRN Jenne Campus, MD   2 drop at 10/19/16 2482  . nicotine (NICODERM CQ - dosed in mg/24 hours) patch 21 mg  21 mg Transdermal Daily Jenne Campus, MD   21 mg at 10/20/16 0840  . OLANZapine (ZYPREXA) tablet 7.5 mg  7.5 mg Oral QHS Myer Peer Braylyn Eye, MD   7.5 mg at 10/19/16 2253  . prazosin (MINIPRESS) capsule 1 mg  1 mg Oral QHS Benjamine Mola, FNP   1 mg at 10/19/16 2253  . thiamine (VITAMIN B-1) tablet 100 mg  100 mg Oral Daily Rozetta Nunnery, NP   100 mg at 10/20/16 5003    Lab Results:  No results found for this or any previous visit (from the past 30 hour(s)).  Blood Alcohol level:   Lab Results  Component Value Date   ETH 65 (H) 70/48/8891    Metabolic Disorder Labs: No results found for: HGBA1C, MPG No results found for: PROLACTIN No results found for: CHOL, TRIG, HDL, CHOLHDL, VLDL, LDLCALC  Physical Findings: AIMS: Facial and Oral Movements Muscles of Facial Expression: None, normal Lips and Perioral Area: None, normal Jaw: None, normal Tongue: None, normal,Extremity Movements Upper (arms, wrists, hands, fingers): None, normal Lower (legs, knees, ankles, toes): None, normal, Trunk Movements Neck, shoulders, hips: None, normal, Overall Severity Severity of abnormal movements (highest score from questions above): None, normal Incapacitation due to  abnormal movements: None, normal Patient's awareness of abnormal movements (rate only patient's report): No Awareness, Dental Status Current problems with teeth and/or dentures?: No Does patient usually wear dentures?: No  CIWA:  CIWA-Ar Total: 1 COWS:     Musculoskeletal: Strength & Muscle Tone: within normal limits no significant tremors or diaphoresis or psychomotor restlessness noted  Gait & Station: normal Patient leans: N/A  Psychiatric Specialty Exam: Physical Exam  Review of Systems  Musculoskeletal: Positive for joint pain (right ankle; Thorough examination was negative for concerns for fracture or tendon rupture; probable metatarsal ligament strain to dorsal region of right foot. ).  Psychiatric/Behavioral: Positive for depression, substance abuse and suicidal ideas. Negative for hallucinations. The patient is nervous/anxious and has insomnia.   All other systems reviewed and are negative.  no chest pain, no shortness of breath , no vomiting. Reports significant weight loss over recent weeks, associated with depression   Blood pressure 105/69, pulse (!) 102, temperature 97.7 F (36.5 C), temperature source Oral, resp. rate 16, height 5' (1.524 m), weight 113 lb (51.3 kg), last menstrual period  10/07/2016, SpO2 100 %.Body mass index is 22.07 kg/m.  General Appearance: improved  Grooming   Eye Contact:  Good  Speech:  Normal Rate  Volume:  Normal  Mood: remains depressed, partial improvement compared to admission  Affect: remains constricted , anxious, but reactive   Thought Process:  Linear  Orientation:  Full (Time, Place, and Person)  Thought Content:  No hallucinations, no delusions, not internally preoccupied   Suicidal Thoughts:  No denies any current suicidal or self injurious ideations, contracts for safety on unit   Homicidal Thoughts:  No denies violent or homicidal ideations   Memory:  recent and remote grossly intact   Judgement:   Improving   Insight:  improving  Psychomotor Activity:    Concentration:  Concentration: Good and Attention Span: Good  Recall:  Good  Fund of Knowledge:  Good  Language:  Good  Akathisia:  No  Handed:  Right  AIMS (if indicated):     Assets:  Desire for Improvement Resilience  ADL's:  Intact  Cognition:  WNL  Sleep:  Number of Hours: 5.5   Assessment - patient presents with some improvement compared to admission, but remains depressed, anxious, and ruminative . She is not suicidal and is future oriented, focused on getting further treatment after discharge and eventually getting her son back home- he is currently in foster care. She is tolerating medications well . She has significant history of alcohol abuse, and has experienced some cravings for ETOH- interested in residential treatment to work on relapse prevention .  Daily contact with patient to assess and evaluate symptoms and progress in treatment, Medication management, Plan inpatient treatment  and medications as below Encourage group and milieu participation to work on coping skills and symptom reduction  Increase Lamictal to 25 mgrs QAM and 50 mgrs QHS for mood disorder, depression Increase Remeron to 15 mgrs QHS for depression and insomnia  Continue  Zyprexa  7.5 mgrs  QHS for mood disorder/bipolar spectrum disorder/ may also help sleep Continue Minipress  21m QHS for nightmares  Continue Campral 666 mgrs TID for alcohol cravings Continue Ibuprofen 6089mpo tid x 4 days for ankle swelling Continue Vitamin  Supplementation Treatment team working disposition planning options - [facility] may be [facility].    CONeita GarnetMD 10/20/2016, 12:36 PM  Patient ID: PaJearld Leschfemale   DOB:  1976/11/20, 40 y.o.   MRN: [id number]

## 2016-10-21 MED ORDER — LAMOTRIGINE 25 MG PO TABS
ORAL_TABLET | ORAL | 0 refills | Status: DC
Start: 2016-10-21 — End: 2016-12-15

## 2016-10-21 MED ORDER — PRAZOSIN HCL 1 MG PO CAPS: 1.0000 mg | ORAL_CAPSULE | Freq: Every day | ORAL | 0 refills | Status: DC

## 2016-10-21 MED ORDER — ACAMPROSATE CALCIUM 333 MG PO TBEC: 666.0000 mg | DELAYED_RELEASE_TABLET | Freq: Three times a day (TID) | ORAL | 0 refills | Status: DC

## 2016-10-21 MED ORDER — MIRTAZAPINE 7.5 MG PO TABS: 7.5000 mg | ORAL_TABLET | Freq: Every day | ORAL | 0 refills | Status: DC

## 2016-10-21 MED ORDER — NICOTINE 21 MG/24HR TD PT24: 21.0000 mg | MEDICATED_PATCH | Freq: Every day | TRANSDERMAL | 0 refills | Status: DC

## 2016-10-21 MED ORDER — OLANZAPINE 7.5 MG PO TABS: 7.5000 mg | ORAL_TABLET | Freq: Every day | ORAL | 0 refills | Status: DC

## 2016-10-21 NOTE — Progress Notes (Signed)
Pt discharged home on a bus pass. Pt was ambulatory, stable and appreciative at that time. All papers and prescriptions were given and valuables returned. Verbal understanding expressed. Denies SI/HI and A/VH. Pt given opportunity to express concerns and ask questions.  

## 2016-10-21 NOTE — Progress Notes (Signed)
[facility] Group Notes:  (Nursing/MHT/Case Management/Adjunct)  Date:  10/21/2016  Time:  12:37 AM  Type of Therapy:  Psychoeducational Skills  Participation Level:  Active  Participation Quality:  Attentive  Affect:  Flat  Cognitive:  Appropriate  Insight:  Improving  Engagement in Group:  Improving  Modes of Intervention:  Education  Summary of Progress/Problems: Patient states that she had a good day despite dealing with a few of her peers being discharged. She indicated that watching her peers getting discharged was upsetting to her, but at the same time, she understands the need to focus on her own discharge plans. As for the theme of the day, her wellness strategy will be to find a 28 day treatment program.   Westly PamGOODMAN, Graciemae Delisle S 10/21/2016, 12:37 AM

## 2016-10-21 NOTE — Progress Notes (Signed)
  [facility] :  Will you be returning to the same living situation after discharge:  Yes,  Pt returning home At discharge, do you have transportation home?: Yes,  Pt provided with bus pass/fare Do you have the ability to pay for your medications: Yes,  Pt provided with prescriptions  Release of information consent forms completed and in the chart;  Patient's signature needed at discharge.  Patient to Follow up at: Follow-up Information    [facility] Follow up on 10/24/2016.   Why:  at 2:30pm for your hospital discharge appt with Doreene AdasFrances Gil Contact information:  [address] [place], [place] [address] Phone: [phone]       [facility] Follow up on 10/22/2016.   Why:  at 10:00am for therapy with Dr. Harlon FlorKirch Contact information: [address], [address] [place], [place] [address] Phone [phone] Fax [phone]       [facility] Follow up.   Why:  [facility] to check on bed availability.  Contact information: [address] [place], [place]  [phone]          Next level of care provider [facility] Link:no  Safety Planning and Suicide Prevention discussed: Yes,  with Pt; declines family contact  Have you used any form of tobacco in the last 30 days? (Cigarettes, Smokeless Tobacco, Cigars, and/or Pipes): Yes  Has patient been referred to the Quitline?: Patient refused referral  Patient has been referred for addiction treatment: Yes  Verdene LennertLauren C Eluterio Seymour 10/21/2016, 12:58 PM

## 2016-10-21 NOTE — Progress Notes (Signed)
Recreation Therapy Notes  Animal-Assisted Activity (AAA) Program Checklist/Progress Notes Patient Eligibility Criteria Checklist & Daily Group note for Rec TxIntervention  Date: 11.14.2017 Time: 2:45pm [address]  AAA/T Program Assumption of Risk Form signed by Patient/ or Parent Legal Guardian Yes  Patient is free of allergies or sever asthma Yes  Patient reports no fear of animals Yes  Patient reports no history of cruelty to animals Yes  Patient understands his/her participation is voluntary Yes  Patient washes hands before animal contact Yes  Patient washes hands after animal contact Yes  Behavioral Response: Appropriate   Education:Hand Washing, Appropriate Animal Interaction   Education Outcome: Acknowledges education.   Clinical Observations/Feedback: Patient attended session and interacted appropriately with therapy dog and peers.   Leslie Sanders, LRT/CTRS        Leslie Sanders L 10/21/2016 3:03 PM

## 2016-10-21 NOTE — [facility] Suicide Risk Assessment (Addendum)
[facility]   Principal Problem: Bipolar disorder, most recent episode depressed [facility](HCC) Discharge Diagnoses:  Patient Active Problem List   Diagnosis Date Noted  . Bipolar disorder, most recent episode depressed ([facility]) [F31.30] 10/12/2016  . Anemia, iron deficiency [D50.9] 10/12/2016  . Alcohol use disorder, moderate, dependence ([facility]) [F10.20] 10/12/2016  . Lactose intolerance [E73.9] 10/12/2016    Total Time spent with patient: 30 minutes  Musculoskeletal: Strength & Muscle Tone: within normal limits Gait & Station: normal Patient leans: N/A  Psychiatric Specialty Exam: ROS  Blood pressure (!) 100/55, pulse 80, temperature 98 F (36.7 C), temperature source Oral, resp. rate 16, height 5' (1.524 m), weight 113 lb (51.3 kg), last menstrual period 10/07/2016, SpO2 100 %.Body mass index is 22.07 kg/m.  General Appearance: improved grooming   Eye Contact::  Good  Speech:  Normal Rate409  Volume:  Normal  Mood:  improving mood, states she feels less depressed  Affect:  more reactive, no longer tearful, smiles at times appropriately   Thought Process:  Linear  Orientation:  Full (Time, Place, and Person)  Thought Content:  denies hallucinations, no delusions, not internally preoccupied   Suicidal Thoughts:  No denies any suicidal or self injurious ideations, denies any homicidal or violent ideations   Homicidal Thoughts:  No  Memory:  recent and remote grossly intact   Judgement:  Other:  improving   Insight:  improving   Psychomotor Activity:  Normal  Concentration:  Good  Recall:  Good  Fund of Knowledge:Good  Language: Good  Akathisia:  Negative  Handed:  Right  AIMS (if indicated):     Assets:  Communication Skills Desire for Improvement Resilience  Sleep:  Number of Hours: 5.75  Cognition: WNL  ADL's:  Intact   Mental Status Per Nursing Assessment::   On Admission:     Demographic Factors:  40 year old female, living alone, has two  children  Loss Factors: Son was placed in foster care due to violent behaviors , limited support network   Historical Factors: History of depression, history of Alcohol Abuse   Risk Reduction Factors:   Responsible for children under 41 years of age, Sense of responsibility to family and Positive coping skills or problem solving skills  Continued Clinical Symptoms:  Patient reports partial but noticeable improvement compared to admission presentation - she is feeling better, less depressed, describes feeling more hopeful and less overwhelmed, and denies any ongoing suicidal or self injurious ideations. No homicidal ideations , no psychotic symptoms, future oriented- looking forward to going to a scheduled meeting later this week with her son's foster mother and foster care case worker to decide process by which her son will transition back to live with her. [facility] to work on sobriety , relapse prevention efforts. Denies medication side effects .  Cognitive Features That Contribute To Risk:  No gross cognitive deficits noted upon discharge. Is alert , attentive, and oriented x 3   Suicide Risk:  Mild:  Suicidal ideation of limited frequency, intensity, duration, and specificity.  There are no identifiable plans, no associated intent, mild dysphoria and related symptoms, good self-control (both objective and subjective assessment), few other risk factors, and identifiable protective factors, including available and accessible social support.  Follow-up Information    [facility] Follow up on 10/24/2016.   Why:  at 2:30pm for your hospital discharge appt with Doreene AdasFrances Gil Contact information:  [address] [place], [place] [address] Phone: [phone]       [facility] Follow up on 10/22/2016.   Why:  at 10:00am for therapy with Dr. Harlon FlorKirch Contact information: [address] [place], [place]  [address] Phone [phone] Fax [phone]       [facility] Follow up.   Why:  [facility]-3 days to check on bed availability.  Contact information: [address] [place], [place]  [phone]          Plan Of Care/Follow-up recommendations:  Activity:  as tolerated Diet:  Regular  Tests:  NA Other:  see below Patient is requesting discharge and there are no current grounds for involuntary commitment  She is leaving unit in good spirits She is planning on following up as above Reports high level of motivation in remaining sober/abstinent from alcohol - we reviewed importance of avoiding people, places and situations that she associates with alcohol in order to minimize risk of relapse and have encouraged AA attendance  Patient to follow up with PCP for ongoing management of medical issues , including Vitamin D deficiency and Anemia Deaire Mcwhirter, MD 10/21/2016, 2:27 PM

## 2016-10-21 NOTE — Tx Team (Signed)
Interdisciplinary Treatment and Diagnostic Plan Update  10/21/2016 Time of Session: 12:57 PM  Leslie Sanders MRN: [id number]  Principal Diagnosis: Bipolar disorder, most recent episode depressed (HCC)  Secondary Diagnoses: Principal Problem:   Bipolar disorder, most recent episode depressed (HCC) Active Problems:   Anemia, iron deficiency   Alcohol use disorder, moderate, dependence (HCC)   Lactose intolerance   Current Medications:  Current Facility-Administered Medications  Medication Dose Route Frequency Provider Last Rate Last Dose  . acamprosate (CAMPRAL) tablet 666 mg  666 mg Oral TID WC Craige Cotta, MD   666 mg at 10/21/16 4098  . alum & mag hydroxide-simeth (MAALOX/MYLANTA) 200-200-20 MG/5ML suspension 30 mL  30 mL Oral Q4H PRN Thermon Leyland, NP   30 mL at 10/14/16 1191  . cholecalciferol (VITAMIN D) tablet 1,000 Units  1,000 Units Oral Daily Craige Cotta, MD   1,000 Units at 10/21/16 [phone]  . docusate sodium (COLACE) capsule 100 mg  100 mg Oral Daily Beau Fanny, FNP   100 mg at 10/21/16 0818  . feeding supplement (ENSURE ENLIVE) (ENSURE ENLIVE) liquid 237 mL  237 mL Oral BID BM Saramma Eappen, MD   237 mL at 10/19/16 1027  . ferrous sulfate tablet 325 mg  325 mg Oral Q breakfast Beau Fanny, FNP   325 mg at 10/21/16 0818  . ibuprofen (ADVIL,MOTRIN) tablet 600 mg  600 mg Oral TID Beau Fanny, FNP   600 mg at 10/21/16 0818  . lamoTRIgine (LAMICTAL) tablet 25 mg  25 mg Oral q morning - 10a Craige Cotta, MD   25 mg at 10/21/16 0958  . lamoTRIgine (LAMICTAL) tablet 50 mg  50 mg Oral QHS Craige Cotta, MD   50 mg at 10/20/16 2224  . magnesium hydroxide (MILK OF MAGNESIA) suspension 30 mL  30 mL Oral Daily PRN Thermon Leyland, NP      . mirtazapine (REMERON) tablet 7.5 mg  7.5 mg Oral QHS Kerry Hough, PA-C   7.5 mg at 10/20/16 2224  . multivitamin with minerals tablet 1 tablet  1 tablet Oral Daily Jackelyn Poling, NP   1 tablet at 10/21/16 0818  .  naphazoline-glycerin (CLEAR EYES) ophth solution 1-2 drop  1-2 drop Both Eyes QID PRN Craige Cotta, MD   2 drop at 10/19/16 9562  . nicotine (NICODERM CQ - dosed in mg/24 hours) patch 21 mg  21 mg Transdermal Daily Craige Cotta, MD   21 mg at 10/21/16 0819  . OLANZapine (ZYPREXA) tablet 7.5 mg  7.5 mg Oral QHS Rockey Situ Cobos, MD   7.5 mg at 10/20/16 2224  . prazosin (MINIPRESS) capsule 1 mg  1 mg Oral QHS Beau Fanny, FNP   1 mg at 10/20/16 2224  . thiamine (VITAMIN B-1) tablet 100 mg  100 mg Oral Daily Jackelyn Poling, NP   100 mg at 10/21/16 0818    PTA Medications: Prescriptions Prior to Admission  Medication Sig Dispense Refill Last Dose  . Carboxymethylcellul-Glycerin (CLEAR EYES FOR DRY EYES) 1-0.25 % SOLN Place 1 drop into both eyes daily as needed (dry eyes).   Past Week at Unknown time  . Cyanocobalamin (B-12) 2500 MCG TABS Take 2,500 mcg by mouth daily.  11 Past Week at Unknown time  . folic acid (FOLVITE) 1 MG tablet Take 1 mg by mouth daily.  2 Past Week at Unknown time  . sertraline (ZOLOFT) 50 MG tablet Take 50 mg by mouth  daily.   09/27/2016  . traZODone (DESYREL) 100 MG tablet Take 100 mg by mouth at bedtime as needed.   Past Week at Unknown time    Treatment Modalities: Medication Management, Group therapy, Case management,  1 to 1 session with clinician, Psychoeducation, Recreational therapy.  Patient Stressors: Financial difficulties Marital or family conflict Substance abuse  Patient Strengths: Ability for insight Average or above average intelligence Capable of independent living Occupational hygienistCommunication skills  Physician Treatment Plan for Primary Diagnosis: Bipolar disorder, most recent episode depressed (HCC) Long Term Goal(s): Improvement in symptoms so as ready for discharge  Short Term Goals: Ability to identify changes in lifestyle to reduce recurrence of condition will improve Ability to verbalize feelings will improve Ability to disclose and discuss  suicidal ideas Ability to demonstrate self-control will improve Ability to identify and develop effective coping behaviors will improve Ability to maintain clinical measurements within normal limits will improve Compliance with prescribed medications will improve Ability to identify triggers associated with substance abuse/mental health issues will improve Ability to identify changes in lifestyle to reduce recurrence of condition will improve Ability to verbalize feelings will improve Ability to disclose and discuss suicidal ideas Ability to demonstrate self-control will improve Ability to identify and develop effective coping behaviors will improve Ability to maintain clinical measurements within normal limits will improve Compliance with prescribed medications will improve Ability to identify triggers associated with substance abuse/mental health issues will improve  Medication Management: Evaluate patient's response, side effects, and tolerance of medication regimen.  Therapeutic Interventions: 1 to 1 sessions, Unit Group sessions and Medication administration.  Evaluation of Outcomes: Adequate for Discharge  Physician Treatment Plan for Secondary Diagnosis: Principal Problem:   Bipolar disorder, most recent episode depressed (HCC) Active Problems:   Anemia, iron deficiency   Alcohol use disorder, moderate, dependence (HCC)   Lactose intolerance   Long Term Goal(s): Improvement in symptoms so as ready for discharge  Short Term Goals: Ability to identify changes in lifestyle to reduce recurrence of condition will improve Ability to verbalize feelings will improve Ability to disclose and discuss suicidal ideas Ability to demonstrate self-control will improve Ability to identify and develop effective coping behaviors will improve Ability to maintain clinical measurements within normal limits will improve Compliance with prescribed medications will improve Ability to identify  triggers associated with substance abuse/mental health issues will improve Ability to identify changes in lifestyle to reduce recurrence of condition will improve Ability to verbalize feelings will improve Ability to disclose and discuss suicidal ideas Ability to demonstrate self-control will improve Ability to identify and develop effective coping behaviors will improve Ability to maintain clinical measurements within normal limits will improve Compliance with prescribed medications will improve Ability to identify triggers associated with substance abuse/mental health issues will improve  Medication Management: Evaluate patient's response, side effects, and tolerance of medication regimen.  Therapeutic Interventions: 1 to 1 sessions, Unit Group sessions and Medication administration.  Evaluation of Outcomes: Adequate for Discharge   RN Treatment Plan for Primary Diagnosis: Bipolar disorder, most recent episode depressed (HCC) Long Term Goal(s): Knowledge of disease and therapeutic regimen to maintain health will improve  Short Term Goals: Ability to verbalize feelings will improve, Ability to disclose and discuss suicidal ideas and Ability to identify and develop effective coping behaviors will improve  Medication Management: RN will administer medications as ordered by provider, will assess and evaluate patient's response and provide education to patient for prescribed medication. RN will report any adverse and/or side effects to prescribing provider.  Therapeutic Interventions: 1 on 1 counseling sessions, Psychoeducation, Medication administration, Evaluate responses to treatment, Monitor vital signs and CBGs as ordered, Perform/monitor CIWA, COWS, AIMS and Fall Risk screenings as ordered, Perform wound care treatments as ordered.  Evaluation of Outcomes: Adequate for Discharge   LCSW Treatment Plan for Primary Diagnosis: Bipolar disorder, most recent episode depressed (HCC) Long  Term Goal(s): Safe transition to appropriate next level of care at discharge, Engage patient in therapeutic group addressing interpersonal concerns.  Short Term Goals: Engage patient in aftercare planning with referrals and resources, Identify triggers associated with mental health/substance abuse issues and Increase skills for wellness and recovery  Therapeutic Interventions: Assess for all discharge needs, 1 to 1 time with Social worker, Explore available resources and support systems, Assess for adequacy in community support network, Educate family and significant other(s) on suicide prevention, Complete Psychosocial Assessment, Interpersonal group therapy.  Evaluation of Outcomes: Adequate for Discharge   Progress in Treatment: Attending groups: Yes Participating in groups: Intermittently Taking medication as prescribed: Yes, MD continues to assess for medication changes as needed Toleration medication: Yes, no side effects reported at this time Family/Significant other contact made: No, Pt declines Patient understands diagnosis: Developing insight Discussing patient identified problems/goals with staff: Yes Medical problems stabilized or resolved: Yes Denies suicidal/homicidal ideation: Yes Issues/concerns per patient self-inventory: None Other: N/A  New problem(s) identified: None identified at this time.   New Short Term/Long Term Goal(s): None identified at this time.   Discharge Plan or Barriers: Pt will return home and follow-up with outpatient services.  [facility] referral currently pending; bed was given away due to Pt SI on 11/9. No beds until at least Monday 11/13\  10/21/2016: Pt remains on [facility]'s list; Pt requesting bed after 11/15 as she has an important meeting to attend  Reason for Continuation of Hospitalization: None identified at this time.   Estimated Length of Stay: 0 days  Attendees: Patient: 10/21/2016  12:57 PM  Physician: Dr. Jama Flavorsobos 10/21/2016  12:57 PM   Nursing: Quintella ReichertBeverly Knight, RN; Midge AverJane Ochieng, RN 10/21/2016  12:57 PM  RN Care Manager: Onnie BoerJennifer Clark, RN 10/21/2016  12:57 PM  Social Worker: Vernie ShanksLauren Danta Baumgardner, LCSW; Belenda CruiseKristin Drinkard, LCSW 10/21/2016  12:57 PM  Recreational Therapist:  10/21/2016  12:57 PM  Other: Gray BernhardtMay Augustin, NP; Claudette Headonrad Withrow, NP 10/21/2016  12:57 PM  Other:  10/21/2016  12:57 PM  Other: 10/21/2016  12:57 PM    Scribe for Treatment Team: Verdene LennertLauren C Penny Arrambide, LCSW 10/21/2016 12:57 PM
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
## 2016-11-26 ENCOUNTER — Encounter (HOSPITAL_COMMUNITY): Payer: Self-pay | Admitting: Vascular Surgery

## 2016-11-26 ENCOUNTER — Emergency Department (HOSPITAL_COMMUNITY)
Admission: EM | Admit: 2016-11-26 | Discharge: 2016-11-27 | Disposition: A | Discharge status: Other Acute Inpatient Hospital | Payer: Medicare Other | Attending: Emergency Medicine | Admitting: Emergency Medicine

## 2016-11-26 DIAGNOSIS — F17210 Nicotine dependence, cigarettes, uncomplicated: Secondary | ICD-10-CM | POA: Insufficient documentation

## 2016-11-26 DIAGNOSIS — R45851 Suicidal ideations: Secondary | ICD-10-CM

## 2016-11-26 DIAGNOSIS — G8929 Other chronic pain: Secondary | ICD-10-CM | POA: Diagnosis not present

## 2016-11-26 DIAGNOSIS — Z79899 Other long term (current) drug therapy: Secondary | ICD-10-CM | POA: Diagnosis not present

## 2016-11-26 DIAGNOSIS — F339 Major depressive disorder, recurrent, unspecified: Secondary | ICD-10-CM | POA: Diagnosis not present

## 2016-11-26 DIAGNOSIS — R197 Diarrhea, unspecified: Secondary | ICD-10-CM

## 2016-11-26 DIAGNOSIS — Z91040 Latex allergy status: Secondary | ICD-10-CM | POA: Diagnosis not present

## 2016-11-26 DIAGNOSIS — F10930 Alcohol use, unspecified with withdrawal, uncomplicated: Secondary | ICD-10-CM

## 2016-11-26 DIAGNOSIS — R110 Nausea: Secondary | ICD-10-CM | POA: Insufficient documentation

## 2016-11-26 DIAGNOSIS — Z720 Tobacco use: Secondary | ICD-10-CM

## 2016-11-26 DIAGNOSIS — F1010 Alcohol abuse, uncomplicated: Secondary | ICD-10-CM

## 2016-11-26 DIAGNOSIS — F10230 Alcohol dependence with withdrawal, uncomplicated: Secondary | ICD-10-CM

## 2016-11-26 DIAGNOSIS — D649 Anemia, unspecified: Secondary | ICD-10-CM | POA: Diagnosis not present

## 2016-11-26 LAB — CBC
HCT: 32.9 % — ABNORMAL LOW (ref 36.0–46.0)
Hemoglobin: 10.2 g/dL — ABNORMAL LOW (ref 12.0–15.0)
MCH: 23.9 pg — AB (ref 26.0–34.0)
MCHC: 31 g/dL (ref 30.0–36.0)
MCV: 77.2 fL — AB (ref 78.0–100.0)
PLATELETS: 352 10*3/uL (ref 150–400)
RBC: 4.26 MIL/uL (ref 3.87–5.11)
RDW: 17.5 % — AB (ref 11.5–15.5)
WBC: 5.7 10*3/uL (ref 4.0–10.5)

## 2016-11-26 LAB — COMPREHENSIVE METABOLIC PANEL
ALK PHOS: 49 U/L (ref 38–126)
ALT: 26 U/L (ref 14–54)
ANION GAP: 7 (ref 5–15)
AST: 49 U/L — AB (ref 15–41)
Albumin: 3.9 g/dL (ref 3.5–5.0)
BILIRUBIN TOTAL: 0.2 mg/dL — AB (ref 0.3–1.2)
CALCIUM: 8.8 mg/dL — AB (ref 8.9–10.3)
CO2: 27 mmol/L (ref 22–32)
Chloride: 107 mmol/L (ref 101–111)
Creatinine, Ser: 0.59 mg/dL (ref 0.44–1.00)
GFR calc Af Amer: >60 mL/min (ref >60)
Glucose, Bld: 108 mg/dL — ABNORMAL HIGH (ref 65–99)
POTASSIUM: 3.4 mmol/L — AB (ref 3.5–5.1)
Sodium: 141 mmol/L (ref 135–145)
TOTAL PROTEIN: 7.6 g/dL (ref 6.5–8.1)

## 2016-11-26 LAB — SALICYLATE LEVEL

## 2016-11-26 LAB — RAPID URINE DRUG SCREEN, HOSP PERFORMED
AMPHETAMINES: NOT DETECTED
Barbiturates: NOT DETECTED
Benzodiazepines: NOT DETECTED
Cocaine: NOT DETECTED
OPIATES: NOT DETECTED
Tetrahydrocannabinol: NOT DETECTED

## 2016-11-26 LAB — I-STAT BETA HCG BLOOD, ED (MC, WL, AP ONLY)

## 2016-11-26 LAB — ETHANOL

## 2016-11-26 LAB — ACETAMINOPHEN LEVEL: Acetaminophen (Tylenol), Serum: <10 ug/mL — ABNORMAL LOW (ref 10–30)

## 2016-11-26 MED ORDER — LORAZEPAM 1 MG PO TABS: 0.0000 mg | ORAL_TABLET | Freq: Two times a day (BID) | ORAL | Status: DC

## 2016-11-26 MED ORDER — VITAMIN B-1 100 MG PO TABS
100.0000 mg | ORAL_TABLET | Freq: Every day | ORAL | Status: DC
  Administered 2016-11-26 – 2016-11-27 (×2): 100 mg via ORAL
  Filled 2016-11-26 (×2): qty 1

## 2016-11-26 MED ORDER — IBUPROFEN 400 MG PO TABS: 600.0000 mg | ORAL_TABLET | Freq: Three times a day (TID) | ORAL | Status: DC | PRN

## 2016-11-26 MED ORDER — ACETAMINOPHEN 325 MG PO TABS: 650.0000 mg | ORAL_TABLET | ORAL | Status: DC | PRN

## 2016-11-26 MED ORDER — THIAMINE HCL 100 MG/ML IJ SOLN: 100.0000 mg | Freq: Every day | INTRAMUSCULAR | Status: DC

## 2016-11-26 MED ORDER — LAMOTRIGINE 25 MG PO TABS
25.0000 mg | ORAL_TABLET | Freq: Two times a day (BID) | ORAL | Status: DC
  Administered 2016-11-26 – 2016-11-27 (×2): 25 mg via ORAL
  Filled 2016-11-26 (×2): qty 1

## 2016-11-26 MED ORDER — NICOTINE 21 MG/24HR TD PT24
21.0000 mg | MEDICATED_PATCH | TRANSDERMAL | Status: DC
  Administered 2016-11-26: 21 mg via TRANSDERMAL
  Filled 2016-11-26: qty 1

## 2016-11-26 MED ORDER — ONDANSETRON HCL 4 MG PO TABS
4.0000 mg | ORAL_TABLET | Freq: Three times a day (TID) | ORAL | Status: DC | PRN
Start: 2016-11-26 — End: 2016-11-27
  Administered 2016-11-27: 4 mg via ORAL
  Filled 2016-11-26: qty 1

## 2016-11-26 MED ORDER — LORAZEPAM 1 MG PO TABS: 0.0000 mg | ORAL_TABLET | Freq: Four times a day (QID) | ORAL | Status: DC

## 2016-11-26 MED ORDER — THIAMINE HCL 100 MG/ML IJ SOLN
100.0000 mg | Freq: Every day | INTRAMUSCULAR | Status: DC
  Filled 2016-11-26: qty 2

## 2016-11-26 MED ORDER — LORAZEPAM 1 MG PO TABS
0.0000 mg | ORAL_TABLET | Freq: Two times a day (BID) | ORAL | Status: DC
Start: 2016-11-28 — End: 2016-11-27

## 2016-11-26 MED ORDER — ALUM & MAG HYDROXIDE-SIMETH 200-200-20 MG/5ML PO SUSP: 30.0000 mL | ORAL | Status: DC | PRN

## 2016-11-26 MED ORDER — ZOLPIDEM TARTRATE 5 MG PO TABS: 5.0000 mg | ORAL_TABLET | Freq: Every evening | ORAL | Status: DC | PRN

## 2016-11-26 MED ORDER — SERTRALINE HCL 50 MG PO TABS
100.0000 mg | ORAL_TABLET | Freq: Every day | ORAL | Status: DC
  Administered 2016-11-26: 100 mg via ORAL
  Filled 2016-11-26: qty 2

## 2016-11-26 MED ORDER — VITAMIN B-1 100 MG PO TABS: 100.0000 mg | ORAL_TABLET | Freq: Every day | ORAL | Status: DC

## 2016-11-26 MED ORDER — NICOTINE 21 MG/24HR TD PT24: 21.0000 mg | MEDICATED_PATCH | Freq: Every day | TRANSDERMAL | Status: DC

## 2016-11-26 MED ORDER — SERTRALINE HCL 50 MG PO TABS: 100.0000 mg | ORAL_TABLET | Freq: Every day | ORAL | Status: DC

## 2016-11-26 MED ORDER — LORAZEPAM 1 MG PO TABS
0.0000 mg | ORAL_TABLET | Freq: Four times a day (QID) | ORAL | Status: DC
  Administered 2016-11-27: 2 mg via ORAL
  Filled 2016-11-26: qty 2

## 2016-11-26 NOTE — ED Notes (Signed)
Called pts name to take back to room A13. No one answered. Nurse notified, pt moved out of that room.

## 2016-11-26 NOTE — ED Triage Notes (Signed)
Pt reports to the ED for eval of increased depressive symptoms, daily ETOH intake, nausea, and diarrhea. She cannot specify the amount of ETOH but she has been drinking wine. She reports her depression medications caused ankle swelling and other symptoms and she stopped taking her depression medications she she has been having increased depressive. Reports associated SI. She has a plan to OD on pills. She denies any recent attempt. She denies any other substance abuse, AVH, or HI. Pt tearful.

## 2016-11-26 NOTE — ED Notes (Signed)
Called pt's name to obtain vitals, no on answered. Nurse was notified.

## 2016-11-26 NOTE — ED Notes (Signed)
Denies any hx of DT from ETOH withdrawal. Last drink yesterday.

## 2016-11-26 NOTE — ED Notes (Signed)
Sitter at bedside.

## 2016-11-26 NOTE — ED Provider Notes (Signed)
[facility]-EMERGENCY DEPT Provider Note   CSN: [id number] Arrival date & time: 11/26/16  1533     History   Chief Complaint Chief Complaint  Patient presents with  . Diarrhea  . Nausea  . Depression    HPI Leslie Sanders is a 40 y.o. female with a PMHx of depression, bipolar disorder, PTSD, anemia, alcohol use disorder, and lactose intolerance, who presents to the ED with complaints of suicidal thoughts with a plan to overdose on pills, as well as alcohol withdrawal symptoms including nausea and 6 episodes of loose somewhat watery nonbloody diarrhea which has seemingly resolved while waiting in the lobby. She reports that she's had worsening depression over the last several weeks, and today she finally "reached her breaking point" and has had more suicidal thoughts. She states that she has been stressed recently, and has been drinking alcohol which contributes to her depression. She endorses drinking "a lot" of wine yesterday, no alcohol today. She admits to not being on any of her psychiatric medications in the last 2 weeks at least. Reports that she supposed to be on Zoloft 100 mg daily, Lamictal, trazodone, as well as B-12 and folic acid. She can't recall the doses of all of her medications. She denies HI/AVH, or illicit drug use. +Cigarette smoker. Hasn't taken anything for her symptoms, and no known aggravating factors. Denies recent travel/sick contacts/suspicious food intake. Also denies fevers, chills, CP, SOB, abd pain, ongoing nausea, vomiting, ongoing diarrhea, constipation, melena, hematochezia, hematuria, dysuria, numbness, tingling, weakness, or any other medical complaints at this time. Chart review reveals she was seen yesterday by Dr. Lovette Cliche at [facility] on [address] in [facility].  She is supposed to be on Zoloft 100mg /day per his notes, but no other mention of meds in notes. She denied active SI at that visit. Was told to f/up with him in 1-2wks.  Here voluntarily at  this time.    The history is provided by the patient and medical records. No language interpreter was used.  Diarrhea   Pertinent negatives include no abdominal pain, no vomiting, no chills, no arthralgias and no myalgias.  Depression  Pertinent negatives include no chest pain, no abdominal pain and no shortness of breath.  Mental Health Problem  Presenting symptoms: depression and suicidal thoughts   Presenting symptoms: no hallucinations and no homicidal ideas   Onset quality:  Gradual Duration: several weeks. Timing:  Constant Progression:  Worsening Chronicity:  Recurrent Context: alcohol use and noncompliance   Treatment compliance:  Untreated Time since last psychoactive medication taken:  2 weeks Relieved by:  None tried Worsened by:  Alcohol (and stress) Ineffective treatments:  None tried Associated symptoms: no abdominal pain and no chest pain   Risk factors: hx of mental illness and recent psychiatric admission     Past Medical History:  Diagnosis Date  . Depression     Patient Active Problem List   Diagnosis Date Noted  . Bipolar disorder, most recent episode depressed (HCC) 10/12/2016  . Anemia, iron deficiency 10/12/2016  . Alcohol use disorder, moderate, dependence (HCC) 10/12/2016  . Lactose intolerance 10/12/2016    History reviewed. No pertinent surgical history.  OB History    No data available       Home Medications    Prior to Admission medications   Medication Sig Start Date End Date Taking? Authorizing Provider  acamprosate (CAMPRAL) 333 MG tablet Take 2 tablets (666 mg total) by mouth 3 (three) times daily with meals. 10/21/16  Adonis BrookSheila Agustin, NP  lamoTRIgine (LAMICTAL) 25 MG tablet Take 1 tab (25 mg ) in morning.  Then take 2 tabs (50 mg) in the evening. 10/21/16   Adonis BrookSheila Agustin, NP  mirtazapine (REMERON) 7.5 MG tablet Take 1 tablet (7.5 mg total) by mouth at bedtime. 10/21/16   Adonis BrookSheila Agustin, NP  nicotine (NICODERM CQ - DOSED IN MG/24  HOURS) 21 mg/24hr patch Place 1 patch (21 mg total) onto the skin daily. 10/22/16   Adonis BrookSheila Agustin, NP  OLANZapine (ZYPREXA) 7.5 MG tablet Take 1 tablet (7.5 mg total) by mouth at bedtime. 10/21/16   Adonis BrookSheila Agustin, NP  prazosin (MINIPRESS) 1 MG capsule Take 1 capsule (1 mg total) by mouth at bedtime. 10/21/16   Adonis BrookSheila Agustin, NP    Family History Family History  Problem Relation Age of Onset  . Schizophrenia Maternal Aunt   . ADD / ADHD Son   . ODD Son     Social History Social History  Substance Use Topics  . Smoking status: Current Every Day Smoker    Packs/day: 0.50    Types: Cigarettes  . Smokeless tobacco: Never Used  . Alcohol use 16.8 oz/week    28 Cans of beer per week     Allergies   Tylenol [acetaminophen]; Hydrocodone-acetaminophen; and Latex   Review of Systems Review of Systems  Constitutional: Negative for chills and fever.  Respiratory: Negative for shortness of breath.   Cardiovascular: Negative for chest pain.  Gastrointestinal: Positive for diarrhea and nausea. Negative for abdominal pain, blood in stool, constipation and vomiting.  Genitourinary: Negative for dysuria and hematuria.  Musculoskeletal: Negative for arthralgias and myalgias.  Skin: Negative for color change.  Allergic/Immunologic: Negative for immunocompromised state.  Neurological: Negative for weakness and numbness.  Psychiatric/Behavioral: Positive for depression and suicidal ideas. Negative for confusion, hallucinations and homicidal ideas.   10 Systems reviewed and are negative for acute change except as noted in the HPI.   Physical Exam Updated Vital Signs BP 119/56 (BP Location: Right Arm)   Pulse 118   Temp 98.3 F (36.8 C) (Oral)   Resp 18   SpO2 100%   Physical Exam  Constitutional: She is oriented to person, place, and time. Vital signs are normal. She appears well-developed and well-nourished.  Non-toxic appearance. No distress.  Afebrile, nontoxic, NAD although  appears anxious at times  HENT:  Head: Normocephalic and atraumatic.  Mouth/Throat: Oropharynx is clear and moist and mucous membranes are normal.  Eyes: Conjunctivae and EOM are normal. Right eye exhibits no discharge. Left eye exhibits no discharge.  Neck: Normal range of motion. Neck supple.  Cardiovascular: Normal rate, regular rhythm, normal heart sounds and intact distal pulses.  Exam reveals no gallop and no friction rub.   No murmur heard. Tachycardic in triage but HR 98 during exam  Pulmonary/Chest: Effort normal and breath sounds normal. No respiratory distress. She has no decreased breath sounds. She has no wheezes. She has no rhonchi. She has no rales.  Abdominal: Soft. Normal appearance and bowel sounds are normal. She exhibits no distension. There is no tenderness. There is no rigidity, no rebound, no guarding, no CVA tenderness, no tenderness at McBurney's point and negative Murphy's sign.  Soft, NTND, +BS throughout, no r/g/r, neg murphy's, neg mcburney's, no CVA TTP   Musculoskeletal: Normal range of motion.  Neurological: She is alert and oriented to person, place, and time. She has normal strength. No sensory deficit.  Skin: Skin is warm, dry and intact. No rash noted.  Psychiatric: Her mood appears anxious. She is not actively hallucinating. She exhibits a depressed mood. She expresses suicidal ideation. She expresses no homicidal ideation. She expresses suicidal plans. She expresses no homicidal plans.  Depressed affect, endorsing SI with plan to OD; denies HI/AVH, doesn't seem to be responding to internal stimuli; anxious appearing at times.  Nursing note and vitals reviewed.    ED Treatments / Results  Labs (all labs ordered are listed, but only abnormal results are displayed) Labs Reviewed  COMPREHENSIVE METABOLIC PANEL - Abnormal; Notable for the following:       Result Value   Potassium 3.4 (*)    Glucose, Bld 108 (*)    BUN <5 (*)    Calcium 8.8 (*)    AST 49  (*)    Total Bilirubin 0.2 (*)    All other components within normal limits  ACETAMINOPHEN LEVEL - Abnormal; Notable for the following:    Acetaminophen (Tylenol), Serum <10 (*)    All other components within normal limits  CBC - Abnormal; Notable for the following:    Hemoglobin 10.2 (*)    HCT 32.9 (*)    MCV 77.2 (*)    MCH 23.9 (*)    RDW 17.5 (*)    All other components within normal limits  ETHANOL  SALICYLATE LEVEL  RAPID URINE DRUG SCREEN, HOSP PERFORMED  I-STAT BETA HCG BLOOD, ED ([facility], [facility], [facility] ONLY)    EKG  EKG Interpretation None       Radiology No results found.  Procedures Procedures (including critical care time)  Medications Ordered in ED Medications  LORazepam (ATIVAN) tablet 0-4 mg (not administered)    Followed by  LORazepam (ATIVAN) tablet 0-4 mg (not administered)  thiamine (VITAMIN B-1) tablet 100 mg (not administered)    Or  thiamine (B-1) injection 100 mg (not administered)  alum & mag hydroxide-simeth (MAALOX/MYLANTA) 200-200-20 MG/5ML suspension 30 mL (not administered)  ondansetron (ZOFRAN) tablet 4 mg (not administered)  nicotine (NICODERM CQ - dosed in mg/24 hours) patch 21 mg (not administered)  ibuprofen (ADVIL,MOTRIN) tablet 600 mg (not administered)  acetaminophen (TYLENOL) tablet 650 mg (not administered)  lamoTRIgine (LAMICTAL) tablet 25 mg (not administered)  sertraline (ZOLOFT) tablet 100 mg (not administered)  zolpidem (AMBIEN) tablet 5 mg (not administered)     Initial Impression / Assessment and Plan / ED Course  I have reviewed the triage vital signs and the nursing notes.  Pertinent labs & imaging results that were available during my care of the patient were reviewed by me and considered in my medical decision making (see chart for details).  Clinical Course     40 y.o. female here with SI with plan to OD, EtOH detox and withdrawal symptoms of nausea and diarrhea, which have improved as of evaluation. No abdominal  tenderness on exam, pt appears depressed but otherwise remainder of exam benign; HR 98 on exam. Denies HI/AVH. Labs reveal: EtOH undetectable. CBC with mild baseline anemia but otherwise unremarkable. CMP with mildly low K 3.4, doubt need for repletion. Salicylate and acetaminophen levels WNL. HCG neg. UDS without any substances detected. Nausea and diarrhea likely from alcohol withdrawal, doubt need for further emergent work up at this time. Smoking cessation encouraged. Pt medically cleared at this time. Psych hold orders and home med orders placed, except for trazodone order since she can't recall the dose she takes. Please see TTS notes for further documentation of care/dispo. PLEASE NOTE THAT PT IS HERE VOLUNTARILY AT THIS TIME, IF  PT TRIES TO LEAVE THEY WOULD NEED IVC PAPERWORK TAKEN OUT. Pt stable at time of med clearance.    Final Clinical Impressions(s) / ED Diagnoses   Final diagnoses:  Suicidal ideation  Episode of recurrent major depressive disorder, unspecified depression episode severity (HCC)  Alcohol abuse  Alcohol withdrawal syndrome without complication (HCC)  Nausea  Diarrhea, unspecified type  Chronic anemia  Tobacco use    New Prescriptions New Prescriptions   No medications on file     Allen Derry, PA-C 11/26/16 1858    Jacalyn Lefevre, MD 11/26/16 2000
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
## 2016-11-26 NOTE — ED Notes (Signed)
Pt found in triage waiting area.

## 2016-11-27 ENCOUNTER — Inpatient Hospital Stay (HOSPITAL_COMMUNITY)
Admission: EM | Admit: 2016-11-27 | Discharge: 2016-12-15 | DRG: 885 | Disposition: A | Discharge status: Home / Self Care | Payer: Medicare Other | Source: Intra-hospital | Attending: Psychiatry | Admitting: Psychiatry

## 2016-11-27 ENCOUNTER — Encounter (HOSPITAL_COMMUNITY): Payer: Self-pay

## 2016-11-27 DIAGNOSIS — R45851 Suicidal ideations: Secondary | ICD-10-CM | POA: Diagnosis present

## 2016-11-27 DIAGNOSIS — F1020 Alcohol dependence, uncomplicated: Secondary | ICD-10-CM | POA: Diagnosis not present

## 2016-11-27 DIAGNOSIS — D509 Iron deficiency anemia, unspecified: Secondary | ICD-10-CM | POA: Diagnosis present

## 2016-11-27 DIAGNOSIS — F419 Anxiety disorder, unspecified: Secondary | ICD-10-CM | POA: Diagnosis present

## 2016-11-27 DIAGNOSIS — G4700 Insomnia, unspecified: Secondary | ICD-10-CM | POA: Diagnosis present

## 2016-11-27 DIAGNOSIS — F339 Major depressive disorder, recurrent, unspecified: Secondary | ICD-10-CM | POA: Diagnosis present

## 2016-11-27 DIAGNOSIS — F3130 Bipolar disorder, current episode depressed, mild or moderate severity, unspecified: Secondary | ICD-10-CM | POA: Diagnosis not present

## 2016-11-27 DIAGNOSIS — E739 Lactose intolerance, unspecified: Secondary | ICD-10-CM | POA: Diagnosis present

## 2016-11-27 DIAGNOSIS — Z91040 Latex allergy status: Secondary | ICD-10-CM | POA: Diagnosis not present

## 2016-11-27 DIAGNOSIS — Z818 Family history of other mental and behavioral disorders: Secondary | ICD-10-CM | POA: Diagnosis not present

## 2016-11-27 DIAGNOSIS — F17210 Nicotine dependence, cigarettes, uncomplicated: Secondary | ICD-10-CM | POA: Diagnosis present

## 2016-11-27 DIAGNOSIS — Z79899 Other long term (current) drug therapy: Secondary | ICD-10-CM | POA: Diagnosis not present

## 2016-11-27 DIAGNOSIS — Z888 Allergy status to other drugs, medicaments and biological substances status: Secondary | ICD-10-CM | POA: Diagnosis not present

## 2016-11-27 MED ORDER — NICOTINE 21 MG/24HR TD PT24: 21.0000 mg | MEDICATED_PATCH | Freq: Every day | TRANSDERMAL | Status: DC

## 2016-11-27 MED ORDER — HYDROXYZINE HCL 25 MG PO TABS
25.0000 mg | ORAL_TABLET | Freq: Three times a day (TID) | ORAL | Status: DC | PRN
  Administered 2016-11-28: 25 mg via ORAL
  Filled 2016-11-27: qty 1

## 2016-11-27 MED ORDER — NICOTINE POLACRILEX 2 MG MT GUM: 2.0000 mg | CHEWING_GUM | OROMUCOSAL | Status: DC | PRN

## 2016-11-27 MED ORDER — IBUPROFEN 600 MG PO TABS
600.0000 mg | ORAL_TABLET | Freq: Three times a day (TID) | ORAL | Status: DC | PRN
  Administered 2016-11-28 – 2016-12-02 (×4): 600 mg via ORAL
  Filled 2016-11-27 (×5): qty 1

## 2016-11-27 MED ORDER — LORAZEPAM 1 MG PO TABS
1.0000 mg | ORAL_TABLET | Freq: Three times a day (TID) | ORAL | Status: DC | PRN
  Administered 2016-11-27: 1 mg via ORAL
  Filled 2016-11-27: qty 1

## 2016-11-27 MED ORDER — ALUM & MAG HYDROXIDE-SIMETH 200-200-20 MG/5ML PO SUSP
30.0000 mL | ORAL | Status: DC | PRN
Start: 2016-11-27 — End: 2016-12-15

## 2016-11-27 MED ORDER — MAGNESIUM HYDROXIDE 400 MG/5ML PO SUSP
30.0000 mL | Freq: Every day | ORAL | Status: DC | PRN
  Administered 2016-12-06 – 2016-12-09 (×2): 30 mL via ORAL
  Filled 2016-11-27 (×2): qty 30

## 2016-11-27 MED ORDER — TRAZODONE HCL 50 MG PO TABS
50.0000 mg | ORAL_TABLET | Freq: Every evening | ORAL | Status: DC | PRN
  Administered 2016-11-27 – 2016-12-04 (×8): 50 mg via ORAL
  Filled 2016-11-27 (×8): qty 1

## 2016-11-27 MED ORDER — ONDANSETRON HCL 4 MG PO TABS: 4.0000 mg | ORAL_TABLET | Freq: Three times a day (TID) | ORAL | Status: DC | PRN

## 2016-11-27 MED ORDER — VITAMIN B-12 1000 MCG PO TABS
2500.0000 ug | ORAL_TABLET | Freq: Every day | ORAL | Status: DC
  Administered 2016-11-28 – 2016-12-11 (×14): 2500 ug via ORAL
  Filled 2016-11-27 (×16): qty 2.5

## 2016-11-27 MED ORDER — FOLIC ACID 1 MG PO TABS
1.0000 mg | ORAL_TABLET | Freq: Every day | ORAL | Status: DC
  Administered 2016-11-28: 1 mg via ORAL
  Filled 2016-11-27 (×3): qty 1

## 2016-11-27 NOTE — Progress Notes (Signed)
[facility] by Melvenia BeamSimon PA, attending Dr. Jama Flavorsobos. [facility]  [facility]  Ilean SkillMeghan Lanisha Stepanian, MSW, LCSW Clinical Social Work, Disposition  11/27/2016 [phone]

## 2016-11-27 NOTE — ED Notes (Signed)
Pt confirmed belongings & signed for release of belongings to [facility]

## 2016-11-27 NOTE — ED Notes (Signed)
Pt was c/o of nausea and a headache. Performed a CIWWA and she scored a 11. Gave the pt 4mg  of Zofran and 2mg  of Ativan PO.

## 2016-11-27 NOTE — BH Assessment (Signed)
Notified Kathlene NovemberMike, RN of decision per Donell SievertSpencer Simon, NP who states pt meets inpatient criteria. [facility] TTS will seek outside placement.   Orlie PollenAshley Shea Swalley, LPC, NCC,  LCAS-A Therapeutic Triage Specialist  11/27/2016 12:34 AM

## 2016-11-27 NOTE — ED Notes (Signed)
Report received.  Pt sleeping, NAD, calm, rise and fall of chest noted, repositions self, no dyspnea noted, sitter present, visualized on CCTV.  

## 2016-11-27 NOTE — ED Notes (Signed)
Pt c/o of burning/itching at site of nicotine patch. Pt requested that it be removed. Patch removed and thrown away by RN.

## 2016-11-27 NOTE — ED Notes (Signed)
Patient was given a snack and drink, and a regular diet was ordered for Lunch. 

## 2016-11-27 NOTE — ED Notes (Signed)
Pt sleeping, NAD, calm, rise and fall of chest noted, repositions self, no dyspnea noted, sitter present, visualized on CCTV. 

## 2016-11-27 NOTE — ED Notes (Signed)
BH called and told this RN that Ms. Leslie Sanders meets criteria for placement but is just waiting for a room.

## 2016-11-27 NOTE — Tx Team (Signed)
Initial Treatment Plan 11/27/2016 3:02 PM Girtha Rmaula M Rea WJX:[id number]    PATIENT STRESSORS: Marital or family conflict Substance abuse Other: dealing with special needs child   PATIENT STRENGTHS: Capable of independent living Communication skills General fund of knowledge Motivation for treatment/growth Physical Health   PATIENT IDENTIFIED PROBLEMS: Depression  Suicidal ideation  Substance abuse  "Get back on my meds"  "Figure out some other place I can go after I leave here that can help me with coping mechanisms."               DISCHARGE CRITERIA:  Improved stabilization in mood, thinking, and/or behavior Verbal commitment to aftercare and medication compliance Withdrawal symptoms are absent or subacute and managed without 24-hour nursing intervention  PRELIMINARY DISCHARGE PLAN: Outpatient therapy "Hopefully [facility] after I leave"  PATIENT/FAMILY INVOLVEMENT: This treatment plan has been presented to and reviewed with the patient, [name].  The patient and family have been given the opportunity to ask questions and make suggestions.  Levin BaconHeather V Loretto Belinsky, RN 11/27/2016, 3:02 PM

## 2016-11-27 NOTE — Tx Team (Signed)
Leslie Sanders is a 40 year old female being admitted voluntarily to [facility] from [facility].  She came to the ED for help with depression, suicidal ideation and alcohol use.  She denies any medical problems and appears to be in no physical distress.  She stated that she tried to call [facility] and [facility] and they told her that she had to come here for treatment first.  She continues to voice suicidal ideation with no plan and she is able to contract for safety on the unit.  She reports that she has been drinking 5-6 beers per day and has been doing this for three weeks.  She is diagnosed with Major Depressive Disorder.  Oriented her to the unit.  Admission paperwork completed and signed.  Belongings searched and secured in locker # 48.  Skin assessment completed and noted multiple tattoo but no other skin issues .  Q 15 minute checks initiated for safety.  We will monitor the progress towards her goals.

## 2016-11-27 NOTE — ED Notes (Signed)
Resting/ sleeping, NAD, calm, lying in bed, rise and fall of chest noted, repositions self, sitter present, breakfast ordered.  

## 2016-11-28 DIAGNOSIS — R45851 Suicidal ideations: Secondary | ICD-10-CM

## 2016-11-28 DIAGNOSIS — Z91040 Latex allergy status: Secondary | ICD-10-CM

## 2016-11-28 DIAGNOSIS — Z888 Allergy status to other drugs, medicaments and biological substances status: Secondary | ICD-10-CM

## 2016-11-28 MED ORDER — HYDROXYZINE HCL 25 MG PO TABS
25.0000 mg | ORAL_TABLET | Freq: Four times a day (QID) | ORAL | Status: AC | PRN
  Administered 2016-11-30 – 2016-12-01 (×2): 25 mg via ORAL
  Filled 2016-11-28 (×3): qty 1

## 2016-11-28 MED ORDER — LAMOTRIGINE 25 MG PO TABS
25.0000 mg | ORAL_TABLET | Freq: Every day | ORAL | Status: DC
  Administered 2016-11-28 – 2016-12-04 (×7): 25 mg via ORAL
  Filled 2016-11-28 (×11): qty 1

## 2016-11-28 MED ORDER — VITAMIN B-1 100 MG PO TABS
100.0000 mg | ORAL_TABLET | Freq: Every day | ORAL | Status: DC
  Administered 2016-11-29 – 2016-12-11 (×13): 100 mg via ORAL
  Filled 2016-11-28 (×16): qty 1

## 2016-11-28 MED ORDER — OLANZAPINE 5 MG PO TABS
5.0000 mg | ORAL_TABLET | Freq: Every day | ORAL | Status: DC
  Administered 2016-11-28 – 2016-12-01 (×4): 5 mg via ORAL
  Filled 2016-11-28 (×6): qty 1

## 2016-11-28 MED ORDER — THIAMINE HCL 100 MG/ML IJ SOLN
100.0000 mg | Freq: Once | INTRAMUSCULAR | Status: AC
  Administered 2016-11-28: 100 mg via INTRAMUSCULAR
  Filled 2016-11-28: qty 2

## 2016-11-28 MED ORDER — ONDANSETRON 4 MG PO TBDP: 4.0000 mg | ORAL_TABLET | Freq: Four times a day (QID) | ORAL | Status: AC | PRN

## 2016-11-28 MED ORDER — LOPERAMIDE HCL 2 MG PO CAPS: 2.0000 mg | ORAL_CAPSULE | ORAL | Status: AC | PRN

## 2016-11-28 MED ORDER — ADULT MULTIVITAMIN W/MINERALS CH
1.0000 | ORAL_TABLET | Freq: Every day | ORAL | Status: DC
  Administered 2016-11-28 – 2016-12-02 (×5): 1 via ORAL
  Filled 2016-11-28 (×9): qty 1

## 2016-11-28 MED ORDER — LORAZEPAM 1 MG PO TABS
1.0000 mg | ORAL_TABLET | Freq: Four times a day (QID) | ORAL | Status: AC | PRN
  Administered 2016-11-28 – 2016-12-01 (×3): 1 mg via ORAL
  Filled 2016-11-28 (×3): qty 1

## 2016-11-28 NOTE — Progress Notes (Signed)
Nursing Note: 0700-1900  D:  Pt presents with anxious mood, guarded with head down when speaking with this RN, noted improvement in interaction throughout the day.  Reported depression, anxiety and hopelessness all 9/10 on self inventory and that her goal for today:  "my ability to live and get help I need to live outside."   Pt restarted on home meds that she stopped taking around 3 weeks ago (Lamictal on this shift).  CIWA scale has ranged from 1 to 6, stable at current time.  Pt asking to start a medication to help with her period, she received a prescription recently: Lysteda 650mg  PO TID prn during period. Will pass onto medical staff.  A:  Encouraged to verbalize needs, active listening and support provided.  Continued Q 15 minute safety checks.    R:  Pt. denies A/V hallucinations and is able to verbally contract for safety.

## 2016-11-28 NOTE — Progress Notes (Signed)
D.  Pt in dayroom on approach, no complaints voiced at this time.  Pt was positive for evening wrap up group, interacting appropriately with peers on the unit.  Pt denies SI/HI/hallucinations at this time.  A.  Support and encouragement offered, medication given as ordered  R.  Pt remains safe on the unit, will continue to monitor.

## 2016-11-28 NOTE — Progress Notes (Signed)
Recreation Therapy Notes  Date: 11/28/16 Time: 0930 Location: 300 Hall Group Room  Group Topic: Stress Management  Goal Area(s) Addresses:  Patient will verbalize importance of using healthy stress management.  Patient will identify positive emotions associated with healthy stress management.   Intervention: Calm App  Activity :  Gratitude Meditation.  LRT introduced the stress management technique of meditation to group.  LRT played the meditation from the Calm app to allow patients to focus on things they should be grateful for and not the things they don't have.  Patients were to follow along as the meditation played to engage in the technique.  Education:  Stress Management, Discharge Planning.   Education Outcome: Acknowledges edcuation/In group clarification offered/Needs additional education  Clinical Observations/Feedback: Pt did not attend group.   Caroll RancherMarjette Rulon Abdalla, LRT/CTRS         Caroll RancherLindsay, Yides Saidi A 11/28/2016 11:43 AM

## 2016-11-28 NOTE — [facility] Group Notes (Signed)
[facility] LCSW Group Therapy 11/28/2016 1:15 PM Type of Therapy: Group Therapy Participation Level: Minimally  Participation Quality: Attentive  Affect: Depressed and Flat  Cognitive: Alert and Oriented  Insight: Developing/Improving and Engaged  Engagement in Therapy: Developing/Improving and Engaged  Modes of Intervention: Clarification, Confrontation, Discussion, Education, Exploration, Limit-setting, Orientation, Problem-solving, Rapport Building, Dance movement psychotherapisteality Testing, Socialization and Support  Summary of Progress/Problems: The topic for today was feelings about relapse. Pt discussed what relapse prevention is to them and identified triggers that they are on the path to relapse. Pt processed their feeling towards relapse and was able to relate to peers. Pt discussed coping skills that can be used for relapse prevention. Patient did not participate in discussion despite CSW encouragement.    Leslie BruinKristin Melchizedek Sanders, MSW, LCSW Clinical Social Worker [facility] [phone]

## 2016-11-28 NOTE — Tx Team (Signed)
Interdisciplinary Treatment and Diagnostic Plan Update  11/28/2016 Time of Session: 9:30am Leslie Sanders MRN: [id number]  Principal Diagnosis: Bipolar disorder, most recent episode depressed [facility])  Secondary Diagnoses: Principal Problem:   Bipolar disorder, most recent episode depressed ([place]) Active Problems:   Major depression, recurrent ([place])   Current Medications:  Current Facility-Administered Medications  Medication Dose Route Frequency Provider Last Rate Last Dose  . alum & mag hydroxide-simeth (MAALOX/MYLANTA) 200-200-20 MG/5ML suspension 30 mL  30 mL Oral Q4H PRN Rozetta Nunnery, NP      . folic acid (FOLVITE) tablet 1 mg  1 mg Oral Daily Rozetta Nunnery, NP   1 mg at 11/28/16 0803  . hydrOXYzine (ATARAX/VISTARIL) tablet 25 mg  25 mg Oral TID PRN Rozetta Nunnery, NP   25 mg at 11/28/16 0630  . ibuprofen (ADVIL,MOTRIN) tablet 600 mg  600 mg Oral Q8H PRN Niel Hummer, NP   600 mg at 11/28/16 0630  . LORazepam (ATIVAN) tablet 1 mg  1 mg Oral Q8H PRN Niel Hummer, NP   1 mg at 11/27/16 2311  . magnesium hydroxide (MILK OF MAGNESIA) suspension 30 mL  30 mL Oral Daily PRN Rozetta Nunnery, NP      . nicotine polacrilex (NICORETTE) gum 2 mg  2 mg Oral PRN Jenne Campus, MD      . ondansetron (ZOFRAN) tablet 4 mg  4 mg Oral Q8H PRN Niel Hummer, NP      . traZODone (DESYREL) tablet 50 mg  50 mg Oral QHS PRN Rozetta Nunnery, NP   50 mg at 11/27/16 2311  . vitamin B-12 (CYANOCOBALAMIN) tablet 2,500 mcg  2,500 mcg Oral Daily Rozetta Nunnery, NP   2,500 mcg at 11/28/16 0803   PTA Medications: Prescriptions Prior to Admission  Medication Sig Dispense Refill Last Dose  . acamprosate (CAMPRAL) 333 MG tablet Take 2 tablets (666 mg total) by mouth 3 (three) times daily with meals. (Patient not taking: Reported on 11/27/2016) 180 tablet 0 Not Taking at Unknown time  . Cyanocobalamin (B-12) 2500 MCG TABS Take 2,500 mcg by mouth daily.  11 Not Taking at Unknown time  . fluticasone (FLONASE) 50 MCG/ACT  nasal spray Place 1 spray into the nose daily.   Not Taking at Unknown time  . folic acid (FOLVITE) 1 MG tablet Take 1 mg by mouth daily.  2 Not Taking at Unknown time  . lamoTRIgine (LAMICTAL) 25 MG tablet Take 1 tab (25 mg ) in morning.  Then take 2 tabs (50 mg) in the evening. (Patient not taking: Reported on 11/27/2016) 90 tablet 0 Not Taking  . meloxicam (MOBIC) 15 MG tablet Take 15 mg by mouth daily as needed.   Not Taking at Unknown time  . mirtazapine (REMERON) 7.5 MG tablet Take 1 tablet (7.5 mg total) by mouth at bedtime. (Patient not taking: Reported on 11/27/2016) 30 tablet 0 Not Taking at Unknown time  . nicotine (NICODERM CQ - DOSED IN MG/24 HOURS) 21 mg/24hr patch Place 1 patch (21 mg total) onto the skin daily. (Patient not taking: Reported on 11/27/2016) 28 patch 0 Not Taking at Unknown time  . OLANZapine (ZYPREXA) 7.5 MG tablet Take 1 tablet (7.5 mg total) by mouth at bedtime. (Patient not taking: Reported on 11/27/2016) 30 tablet 0 Not Taking at Unknown time  . prazosin (MINIPRESS) 1 MG capsule Take 1 capsule (1 mg total) by mouth at bedtime. (Patient not taking: Reported on 11/27/2016) 30 capsule 0  Not Taking at Unknown time  . sertraline (ZOLOFT) 100 MG tablet Take 100 mg by mouth daily.   Not Taking at Unknown time  . tranexamic acid (LYSTEDA) 650 MG TABS tablet Take 1,300 mg by mouth 3 (three) times daily. While on period for five days.   Not Taking at Unknown time  . traZODone (DESYREL) 100 MG tablet Take 100 mg by mouth at bedtime.   Not Taking at Unknown time    Patient Stressors: Marital or family conflict Substance abuse Other: dealing with special needs child  Patient Strengths: Capable of independent living Curator fund of knowledge Motivation for treatment/growth Physical Health  Treatment Modalities: Medication Management, Group therapy, Case management,  1 to 1 session with clinician, Psychoeducation, Recreational therapy.   Physician  Treatment Plan for Primary Diagnosis: Bipolar disorder, most recent episode depressed ([place]) Long Term Goal(s): Improvement in symptoms so as ready for discharge Improvement in symptoms so as ready for discharge   Short Term Goals: Ability to identify changes in lifestyle to reduce recurrence of condition will improve Ability to verbalize feelings will improve Ability to disclose and discuss suicidal ideas Ability to identify changes in lifestyle to reduce recurrence of condition will improve Ability to verbalize feelings will improve Ability to demonstrate self-control will improve Compliance with prescribed medications will improve Ability to identify triggers associated with substance abuse/mental health issues will improve  Medication Management: Evaluate patient's response, side effects, and tolerance of medication regimen.  Therapeutic Interventions: 1 to 1 sessions, Unit Group sessions and Medication administration.  Evaluation of Outcomes: Not Met  Physician Treatment Plan for Secondary Diagnosis: Principal Problem:   Bipolar disorder, most recent episode depressed ([place]) Active Problems:   Major depression, recurrent ([place])  Long Term Goal(s): Improvement in symptoms so as ready for discharge Improvement in symptoms so as ready for discharge   Short Term Goals: Ability to identify changes in lifestyle to reduce recurrence of condition will improve Ability to verbalize feelings will improve Ability to disclose and discuss suicidal ideas Ability to identify changes in lifestyle to reduce recurrence of condition will improve Ability to verbalize feelings will improve Ability to demonstrate self-control will improve Compliance with prescribed medications will improve Ability to identify triggers associated with substance abuse/mental health issues will improve     Medication Management: Evaluate patient's response, side effects, and tolerance of medication  regimen.  Therapeutic Interventions: 1 to 1 sessions, Unit Group sessions and Medication administration.  Evaluation of Outcomes: Not Met   RN Treatment Plan for Primary Diagnosis: Bipolar disorder, most recent episode depressed ([place]) Long Term Goal(s): Knowledge of disease and therapeutic regimen to maintain health will improve  Short Term Goals: Ability to remain free from injury will improve, Ability to disclose and discuss suicidal ideas, Ability to identify and develop effective coping behaviors will improve and Compliance with prescribed medications will improve  Medication Management: RN will administer medications as ordered by provider, will assess and evaluate patient's response and provide education to patient for prescribed medication. RN will report any adverse and/or side effects to prescribing provider.  Therapeutic Interventions: 1 on 1 counseling sessions, Psychoeducation, Medication administration, Evaluate responses to treatment, Monitor vital signs and CBGs as ordered, Perform/monitor CIWA, COWS, AIMS and Fall Risk screenings as ordered, Perform wound care treatments as ordered.  Evaluation of Outcomes: Not Met   LCSW Treatment Plan for Primary Diagnosis: Bipolar disorder, most recent episode depressed ([place]) Long Term Goal(s): Safe transition to appropriate next level of care at  discharge, Engage patient in therapeutic group addressing interpersonal concerns.  Short Term Goals: Engage patient in aftercare planning with referrals and resources, Increase social support, Increase emotional regulation, Identify triggers associated with mental health/substance abuse issues and Increase skills for wellness and recovery  Therapeutic Interventions: Assess for all discharge needs, 1 to 1 time with Social worker, Explore available resources and support systems, Assess for adequacy in community support network, Educate family and significant other(s) on suicide prevention, Complete  Psychosocial Assessment, Interpersonal group therapy.  Evaluation of Outcomes: Not Met   Progress in Treatment :  Attending groups: Continuing to assess  Participating in groups: Continuing to assess  Taking medication as prescribed: Yes, MD continuing to assess for appropriate medication regimen  Toleration medication: Yes  Family/Significant other contact made: Treatment team assessing for appropriate contacts  Patient understands diagnosis: Yes  Discussing patient identified problems/goals with staff: Yes  Medical problems stabilized or resolved: Yes  Denies suicidal/homicidal ideation: Treatment team continuing to asses  Issues/concerns per patient self-inventory: None reported  Other: N/A  New problem(s) identified: None reported at this time    New Short Term/Long Term Goal(s): None at this time    Discharge Plan or Barriers: Treatment team continuing to assess.    Reason for Continuation of Hospitalization: Anxiety Depression Medication stabilization Suicidal Ideations Withdrawal symptoms  Estimated Length of Stay: 3-5 days    Attendees:  Patient:   Physician: Dr. Parke Poisson, Dr. Modesta Messing , MD  11/28/2016   9:30am  Nursing: Desma Paganini, [address] [place] 11/28/2016 9:30am  RN Care Manager:   Social Workers:  Tilden Fossa, LCSW,   11/28/2016 9:30am  Nurse Pratictioners:  Lindell Spar, [place], NP 11/28/2016 9:30am  Other:     Scribe for Treatment Team: Tilden Fossa, [place] Worker [facility] [phone]
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
## 2016-11-28 NOTE — Progress Notes (Signed)
Patient ID: [name], female   DOB: 01/17/1976, 40 y.o.   MRN: [id number] PER STATE REGULATIONS 482.30  THIS CHART WAS REVIEWED FOR MEDICAL NECESSITY WITH RESPECT TO THE PATIENT'S ADMISSION/DURATION OF STAY.  NEXT REVIEW DATE:12/01/16  Loura HaltBARBARA Danzell Birky, RN, BSN CASE MANAGER
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
## 2016-11-28 NOTE — Progress Notes (Signed)
[facility] Group Notes:  (Nursing/MHT/Case Management/Adjunct)  Date:  11/28/2016  Time:  9:35 PM  Type of Therapy:  Psychoeducational Skills  Participation Level:  Active  Participation Quality:  Attentive  Affect:  Flat  Cognitive:  Appropriate  Insight:  Appropriate  Engagement in Group:  Developing/Improving  Modes of Intervention:  Education  Summary of Progress/Problems: The patient described her day as having been "not all that good". The patient explained that she slept a great deal today and that her medications are in the process of being adjusted. As for the theme of the day, her relapse prevention strategy will be to "seek out more support".   Hazle CocaGOODMAN, Miral Hoopes S 11/28/2016, 9:35 PM

## 2016-11-28 NOTE — [facility] Suicide Risk Assessment (Signed)
[facility] INPATIENT:  Family/Significant Other Suicide Prevention Education  Suicide Prevention Education:  Patient Refusal for Family/Significant Other Suicide Prevention Education: The patient Leslie Sanders has refused to provide written consent for family/significant other to be provided Family/Significant Other Suicide Prevention Education during admission and/or prior to discharge.  Physician notified.  Baldo DaubRodney B [facility] 11/28/2016, 11:53 AM

## 2016-11-28 NOTE — Progress Notes (Signed)
Nursing Progress Note 7p-7a  D) Patient presents pleasant and cooperative. Patient is seen interacting with peers in the dayroom. Patient reports passive SI at times but denies currently. Patient denies HI or AVH. Patient denies pain but c/o minimal withdrawal symptoms. Patient contracts for safety at this time.   A) Emotional support given. PRN ativan given as prescribed. Patient on q15 min safety checks. Opportunities for questions or concerns presented to patient. Patient encouraged to continue to identify goals for treatment. VSS. Patient in no acute distress.  R) Patient receptive to interaction with nurse. Patient remains safe on the unit at this time. Patient is resting in bed without complaints. Will continue to monitor.

## 2016-11-28 NOTE — Plan of Care (Signed)
Problem: Safety: Goal: Periods of time without injury will increase Outcome: Progressing Patient remains safe on the unit at this time. VSS. Patient is a low falls risk; fall safety precautions reviewed. Patient on q safety checks.

## 2016-11-29 LAB — LIPID PANEL
CHOLESTEROL: 173 mg/dL (ref 0–200)
HDL: 88 mg/dL (ref >40)
LDL Cholesterol: 68 mg/dL (ref 0–99)
TRIGLYCERIDES: 84 mg/dL (ref <150)
Total CHOL/HDL Ratio: 2 RATIO
VLDL: 17 mg/dL (ref 0–40)

## 2016-11-29 NOTE — Progress Notes (Signed)
D.  Pt pleasant on approach, denies complaints at this time.  Positive for evening wrap up group, observed interacting appropriately with peers on the unit.  Pt denies SI/HI/hallucinations at this time.  A.  Support and encouragement offered, medications given as ordered.  R.  Pt remains safe on the unit, will continue to monitor.

## 2016-11-29 NOTE — Plan of Care (Signed)
Problem: Activity: Goal: Interest or engagement in activities will improve Outcome: Progressing Pt has attended evening wrap up groups this weekend

## 2016-11-29 NOTE — [facility] Group Notes (Signed)
Adult Therapy Group Note  Date:  11/29/2016  Time:  10:00-11:00AM  Group Topic/Focus: Coping with the Holidays  The focus of this group was to share good and bad holiday memories and process how this has affected this holiday season for patients.  Participation Level:  Minimal  Participation Quality:  Attentive and Sharing  Affect:  Depressed and Flat  Cognitive:  Appropriate  Insight: Improving  Engagement in Group:  Developing/Improving  Modes of Intervention:  Exercise, Discussion and Support  Additional Comments:  The patient expressed that this is the first year she has not celebrated Christmas, because her son was relinquished into foster care.  She stated she is doing nothing because he is not there, and as a result she got depressed and had to return to the hospital.  Ambrose MantleMareida Grossman-Orr, LCSW 11/29/2016   12:30 PM

## 2016-11-29 NOTE — Progress Notes (Signed)
D: Pt alert and ambulating ad lib. Rated her depression 4/10. She presents in a flat guarded mood.Voiced concerned about her heavy menstrual flow. Denied SI/HI/AVH.  A: Reported pt's concerns regarding her menstrual flow to NP Conrad. Safety checks maintained.  R: Pt contracted for safety. Continues to follow treatment plan.

## 2016-11-29 NOTE — [facility] Counselor (Signed)
Adult Psychoeducational Group Note  Date:  11/29/2016 Time:  8:30 PM  Group Topic/Focus:  Wrap-Up Group:   The focus of this group is to help patients review their daily goal of treatment and discuss progress on daily workbooks.   Participation Level:  Active  Participation Quality:  Appropriate  Affect:  Appropriate  Cognitive:  Alert  Insight: Good  Engagement in Group:  Engaged  Modes of Intervention:  Discussion and Education  Additional Comments:  Patient reported working to stay calm and accepting her personal reality.   Elmore GuiseSLOAN, Talli Kimmer N 11/29/2016, 9:44 PM

## 2016-11-29 NOTE — [facility] Group Notes (Signed)
Nursing Psycho-educational Group:   Patient attended and actively participated in nursing group. Cooperative and engaged.  Plans to stay up as much as she can and be positive.

## 2016-11-29 NOTE — Progress Notes (Signed)
[facility] MD Progress Note  11/29/2016 12:51 PM YINA RIVIERE  MRN:  [id number] Subjective:   Patient continues to feel depressed, sad. Denies suicidal ideations. Does not endorse medication side effects. Objective : I have discussed case with treatment team and have met with patient . She remains depressed, sad, but does present with partial improvement - affect somewhat more reactive, and better eye contact. Noted by staff to be somewhat more interactive in groups and milieu. At this time denies suicidal ideations. She continues to ruminate about her stressors, states her son ( currently in foster care) has been diagnosed with Autism spectrum disorder, and feels ambivalent about him not being with her. Reports symptoms are worse around Christmas time as she feels more " lonely without my kids ". Denies medication side effects. Responds partially to support, empathy, encouragement , affect does improve partially during session No disruptive or agitated behaviors on unit.    Principal Problem: Bipolar disorder, most recent episode depressed ([place]) Diagnosis:   Patient Active Problem List   Diagnosis Date Noted  . Major depression, recurrent ([place]) [F33.9] 11/27/2016  . Bipolar disorder, most recent episode depressed ([place]) [F31.30] 10/12/2016  . Anemia, iron deficiency [D50.9] 10/12/2016  . Alcohol use disorder, moderate, dependence ([place]) [F10.20] 10/12/2016  . Lactose intolerance [E73.9] 10/12/2016   Total Time spent with patient: 20 minutes  Past Medical History:  Past Medical History:  Diagnosis Date  . Depression    History reviewed. No pertinent surgical history. Family History:  Family History  Problem Relation Age of Onset  . Schizophrenia Maternal Aunt   . ADD / ADHD Son   . ODD Son    Social History:  History  Alcohol Use  . 16.8 oz/week  . 28 Cans of beer per week     History  Drug Use No    Social History   Social History  . Marital status: Divorced    Spouse name:  N/A  . Number of children: N/A  . Years of education: N/A   Social History Main Topics  . Smoking status: Current Every Day Smoker    Packs/day: 0.50    Types: Cigarettes  . Smokeless tobacco: Never Used  . Alcohol use 16.8 oz/week    28 Cans of beer per week  . Drug use: No  . Sexual activity: Not Currently   Other Topics Concern  . None   Social History Narrative  . None   Additional Social History:    Pain Medications: Denies Prescriptions: Denies Over the Counter: Denies History of alcohol / drug use?: Yes Longest period of sobriety (when/how long): unknown  Negative Consequences of Use: Financial, Personal relationships Withdrawal Symptoms: Nausea / Vomiting, Irritability, Sweats, Tremors Name of Substance 1: Alcohol 1 - Age of First Use: 29 1 - Amount (size/oz): 6 beers 1 - Frequency: 3x/week 1 - Duration: ongoing 1 - Last Use / Amount: 11/25/16  Sleep: improving   Appetite:  Fair  Current Medications: Current Facility-Administered Medications  Medication Dose Route Frequency Provider Last Rate Last Dose  . alum & mag hydroxide-simeth (MAALOX/MYLANTA) 200-200-20 MG/5ML suspension 30 mL  30 mL Oral Q4H PRN Rozetta Nunnery, NP      . hydrOXYzine (ATARAX/VISTARIL) tablet 25 mg  25 mg Oral Q6H PRN Jenne Campus, MD      . ibuprofen (ADVIL,MOTRIN) tablet 600 mg  600 mg Oral Q8H PRN Niel Hummer, NP   600 mg at 11/28/16 0630  . lamoTRIgine (LAMICTAL) tablet 25  mg  25 mg Oral Daily Fernando A Cobos, MD   25 mg at 11/29/16 0858  . loperamide (IMODIUM) capsule 2-4 mg  2-4 mg Oral PRN Fernando A Cobos, MD      . LORazepam (ATIVAN) tablet 1 mg  1 mg Oral Q6H PRN Fernando A Cobos, MD   1 mg at 11/28/16 2246  . magnesium hydroxide (MILK OF MAGNESIA) suspension 30 mL  30 mL Oral Daily PRN Jason A Berry, NP      . multivitamin with minerals tablet 1 tablet  1 tablet Oral Daily Fernando A Cobos, MD   1 tablet at 11/29/16 0857  . nicotine polacrilex (NICORETTE) gum 2 mg  2  mg Oral PRN Fernando A Cobos, MD      . OLANZapine (ZYPREXA) tablet 5 mg  5 mg Oral QHS Fernando A Cobos, MD   5 mg at 11/28/16 2245  . ondansetron (ZOFRAN-ODT) disintegrating tablet 4 mg  4 mg Oral Q6H PRN Fernando A Cobos, MD      . thiamine (VITAMIN B-1) tablet 100 mg  100 mg Oral Daily Fernando A Cobos, MD   100 mg at 11/29/16 0858  . traZODone (DESYREL) tablet 50 mg  50 mg Oral QHS PRN Jason A Berry, NP   50 mg at 11/28/16 2246  . vitamin B-12 (CYANOCOBALAMIN) tablet 2,500 mcg  2,500 mcg Oral Daily Jason A Berry, NP   2,500 mcg at 11/29/16 0854    Lab Results:  Results for orders placed or performed during the hospital encounter of 11/27/16 (from the past 48 hour(s))  Lipid panel     Status: None   Collection Time: 11/29/16  6:44 AM  Result Value Ref Range   Cholesterol 173 0 - 200 mg/dL   Triglycerides 84 <150 mg/dL   HDL 88 >40 mg/dL   Total CHOL/HDL Ratio 2.0 RATIO   VLDL 17 0 - 40 mg/dL   LDL Cholesterol 68 0 - 99 mg/dL    Comment:        Total Cholesterol/HDL:CHD Risk Coronary Heart Disease Risk Table                     Men   Women  1/2 Average Risk   3.4   3.3  Average Risk       5.0   4.4  2 X Average Risk   9.6   7.1  3 X Average Risk  23.4   11.0        Use the calculated Patient Ratio above and the CHD Risk Table to determine the patient's CHD Risk.        ATP III CLASSIFICATION (LDL):  <100     mg/dL   Optimal  100-129  mg/dL   Near or Above                    Optimal  130-159  mg/dL   Borderline  160-189  mg/dL   High  >190     mg/dL   Very High Performed at [facility]     Blood Alcohol level:  Lab Results  Component Value Date   ETH <5 11/26/2016   ETH 65 (H) 10/11/2016    Metabolic Disorder Labs: No results found for: HGBA1C, MPG No results found for: PROLACTIN Lab Results  Component Value Date   CHOL 173 11/29/2016   TRIG 84 11/29/2016   HDL 88 11/29/2016   CHOLHDL 2.0 11/29/2016   VLDL 17   11/29/2016   LDLCALC 68 11/29/2016     Physical Findings: AIMS: Facial and Oral Movements Muscles of Facial Expression: None, normal Lips and Perioral Area: None, normal Jaw: None, normal Tongue: None, normal,Extremity Movements Upper (arms, wrists, hands, fingers): None, normal Lower (legs, knees, ankles, toes): None, normal, Trunk Movements Neck, shoulders, hips: None, normal, Overall Severity Severity of abnormal movements (highest score from questions above): None, normal Incapacitation due to abnormal movements: None, normal Patient's awareness of abnormal movements (rate only patient's report): No Awareness, Dental Status Current problems with teeth and/or dentures?: No Does patient usually wear dentures?: No  CIWA:  CIWA-Ar Total: 1 COWS:     Musculoskeletal: Strength & Muscle Tone: within normal limits Gait & Station: normal Patient leans: N/A  Psychiatric Specialty Exam: Physical Exam  ROS denies headache, denies  chest pain, denies shortness of breath, no vomiting  Blood pressure 107/66, pulse (!) 118, temperature 98.3 F (36.8 C), resp. rate 16, height 5' 2" (1.575 m), weight 56.7 kg (125 lb), SpO2 100 %.Body mass index is 22.86 kg/m.  General Appearance: Fairly Groomed  Eye Contact:  partially improved, still downcast at times   Speech:  Normal Rate  Volume:  Decreased  Mood:  remains depressed, sad , but partially improved compared to admission  Affect:  constricted, but does smile briefly at times   Thought Process:  Linear  Orientation:  Full (Time, Place, and Person)  Thought Content:  denies hallucinations, no delusions, not internally preoccupied  Suicidal Thoughts:  No at this time denies suicidal ideations, denies any self injurious ideations, contracts for safety on unit  Homicidal Thoughts:  No denies any homicidal ideations  Memory:  recent and remote grossly intact   Judgement:  Improving   Insight:  Fair  Psychomotor Activity:  Normal  Concentration:  Concentration: Good and  Attention Span: Good  Recall:  Good  Fund of Knowledge:  Good  Language:  Good  Akathisia:  Negative  Handed:  Right  AIMS (if indicated):     Assets:  Desire for Improvement Resilience  ADL's:  Intact  Cognition:  WNL  Sleep:  Number of Hours: 5.75   Assessment - patient is presenting with partial improvement , but does remains depressed, sad, and ruminative about her stressors, mainly family issues regarding her son, who was recently diagnosed with Autism Spectrum Disorder and is in a foster home environment . Denies suicidal ideations . Thus far tolerating medications well  ( Lamictal, Zyprexa )- these are the medications on which she was stabilized during her prior psychiatric admission  Treatment Plan Summary: Daily contact with patient to assess and evaluate symptoms and progress in treatment, Medication management, Plan inpatient admission  and medications as below Encourage group and milieu participation to work on coping skills and symptom reduction Continue Lamictal 25 mgrs QDAY for mood disorder, depression Continue Zyprexa 5 mgrs QHS for mood disorder Continue Trazodone 50 mgrs QHS PRN for insomnia as needed  Continue Ativan 1 mgr Q 6 hours PRN for alcohol withdrawal symptoms as needed  Treatment team working on disposition planning options COBOS, FERNANDO, MD 11/29/2016, 12:51 PM 

## 2016-11-30 DIAGNOSIS — F1020 Alcohol dependence, uncomplicated: Secondary | ICD-10-CM

## 2016-11-30 DIAGNOSIS — E739 Lactose intolerance, unspecified: Secondary | ICD-10-CM

## 2016-11-30 DIAGNOSIS — D509 Iron deficiency anemia, unspecified: Secondary | ICD-10-CM

## 2016-11-30 LAB — PROLACTIN: Prolactin: 62.8 ng/mL — ABNORMAL HIGH (ref 4.8–23.3)

## 2016-11-30 NOTE — [facility] Group Notes (Signed)
Nurse Psycho-Educational Group centered on human needs and healthy support systems.  Patient attended the group and engaged actively.  

## 2016-11-30 NOTE — [facility] Group Notes (Signed)
Adult Therapy Group Note  Date:  11/30/2016 Time: 10:00-11:00AM  Group Topic/Focus:  In group today we briefly discussed things that patients have enjoyed about the holidays.  We then listened to music that patients requested, and they shared what each song meant to them.   Participation Level:  Active  Participation Quality:  Attentive and Sharing  Affect:  Blunted and Depressed  Cognitive:  Appropriate  Insight: Improving  Engagement in Group:  Improving  Modes of Intervention:  Activity and Exploration  Additional Comments:  Pt said she enjoys spending time with her children at Christmas and watching them open presents.  This will be a different year for her due to the circumstances surrounding her son and daughter currently.  Carloyn JaegerMareida J Grossman-Orr 11/30/2016, 1:45 PM

## 2016-11-30 NOTE — Plan of Care (Signed)
Problem: Education: Goal: Ability to make informed decisions regarding treatment will improve Outcome: Progressing Nurse discussed depression/coping skills with patient.   

## 2016-11-30 NOTE — Progress Notes (Signed)
[facility]  11/30/2016 11:46 AM Leslie Sanders  MRN:  [id number] Subjective:   Patient reports " I am trying to get my medications under control, I know I need to stop drinking too."    Objective : Leslie Sanders is awake, alert and oriented*3. Patient appears flat and guarded. Patient reports chronic depression. States she has a plan to seek help for her drinking.  Denies suicidal or homicidal ideation. Denies auditory or visual hallucination and does not appear to be responding to internal stimuli.  Patient reports attending groups session whoever states she is having a heard time staying awake during the sessions. Patient reports he is medication compliant without mediation side effects. States her depression 8/10. Reports a good appetite and reports resting well. Support, encouragement and reassurance was provided.   Principal Problem: Bipolar disorder, most recent episode depressed ([facility]) Diagnosis:   Patient Active Problem List   Diagnosis Date Noted  . Major depression, recurrent ([facility]) [F33.9] 11/27/2016  . Bipolar disorder, most recent episode depressed ([facility]) [F31.30] 10/12/2016  . Anemia, iron deficiency [D50.9] 10/12/2016  . Alcohol use disorder, moderate, dependence ([facility]) [F10.20] 10/12/2016  . Lactose intolerance [E73.9] 10/12/2016   Total Time spent with patient: 20 minutes  Past Medical History:  Past Medical History:  Diagnosis Date  . Depression    History reviewed. No pertinent surgical history. Family History:  Family History  Problem Relation Age of Onset  . Schizophrenia Maternal Aunt   . ADD / ADHD Son   . ODD Son    Social History:  History  Alcohol Use  . 16.8 oz/week  . 28 Cans of beer per week     History  Drug Use No    Social History   Social History  . Marital status: Divorced    Spouse name: N/A  . Number of children: N/A  . Years of education: N/A   Social History Main Topics  . Smoking status: Current Every Day Smoker     Packs/day: 0.50    Types: Cigarettes  . Smokeless tobacco: Never Used  . Alcohol use 16.8 oz/week    28 Cans of beer per week  . Drug use: No  . Sexual activity: Not Currently   Other Topics Concern  . None   Social History Narrative  . None   Additional Social History:    Pain Medications: Denies Prescriptions: Denies Over the Counter: Denies History of alcohol / drug use?: Yes Longest period of sobriety (when/how long): unknown  Negative Consequences of Use: Financial, Personal relationships Withdrawal Symptoms: Nausea / Vomiting, Irritability, Sweats, Tremors Name of Substance 1: Alcohol 1 - Age of First Use: 29 1 - Amount (size/oz): 6 beers 1 - Frequency: 3x/week 1 - Duration: ongoing 1 - Last Use / Amount: 11/25/16  Sleep: improving   Appetite:  Fair  Current Medications: Current Facility-Administered Medications  Medication Dose Route Frequency Provider Last Rate Last Dose  . alum & mag hydroxide-simeth (MAALOX/MYLANTA) 200-200-20 MG/5ML suspension 30 mL  30 mL Oral Q4H PRN Jackelyn PolingJason A Berry, NP      . hydrOXYzine (ATARAX/VISTARIL) tablet 25 mg  25 mg Oral Q6H PRN Craige CottaFernando A Lindell Tussey, MD   25 mg at 11/30/16 0744  . ibuprofen (ADVIL,MOTRIN) tablet 600 mg  600 mg Oral Q8H PRN Thermon LeylandLaura A Davis, NP   600 mg at 11/30/16 0744  . lamoTRIgine (LAMICTAL) tablet 25 mg  25 mg Oral Daily Craige CottaFernando A Fatima Fedie, MD   25 mg at 11/30/16  0740  . loperamide (IMODIUM) capsule 2-4 mg  2-4 mg Oral PRN Craige Cotta, MD      . LORazepam (ATIVAN) tablet 1 mg  1 mg Oral Q6H PRN Craige Cotta, MD   1 mg at 11/29/16 2245  . magnesium hydroxide (MILK OF MAGNESIA) suspension 30 mL  30 mL Oral Daily PRN Jackelyn Poling, NP      . multivitamin with minerals tablet 1 tablet  1 tablet Oral Daily Craige Cotta, MD   1 tablet at 11/30/16 0740  . nicotine polacrilex (NICORETTE) gum 2 mg  2 mg Oral PRN Craige Cotta, MD      . OLANZapine (ZYPREXA) tablet 5 mg  5 mg Oral QHS Craige Cotta, MD   5 mg at  11/29/16 2244  . ondansetron (ZOFRAN-ODT) disintegrating tablet 4 mg  4 mg Oral Q6H PRN Craige Cotta, MD      . thiamine (VITAMIN B-1) tablet 100 mg  100 mg Oral Daily Rockey Situ Rebekha Diveley, MD   100 mg at 11/30/16 0740  . traZODone (DESYREL) tablet 50 mg  50 mg Oral QHS PRN Jackelyn Poling, NP   50 mg at 11/29/16 2245  . vitamin B-12 (CYANOCOBALAMIN) tablet 2,500 mcg  2,500 mcg Oral Daily Jackelyn Poling, NP   2,500 mcg at 11/30/16 0740    Lab Results:  Results for orders placed or performed during the hospital encounter of 11/27/16 (from the past 48 hour(s))  Lipid panel     Status: None   Collection Time: 11/29/16  6:44 AM  Result Value Ref Range   Cholesterol 173 0 - 200 mg/dL   Triglycerides 84 <478 mg/dL   HDL 88 >29 mg/dL   Total CHOL/HDL Ratio 2.0 RATIO   VLDL 17 0 - 40 mg/dL   LDL Cholesterol 68 0 - 99 mg/dL    Comment:        Total Cholesterol/HDL:CHD Risk Coronary Heart Disease Risk Table                     Men   Women  1/2 Average Risk   3.4   3.3  Average Risk       5.0   4.4  2 X Average Risk   9.6   7.1  3 X Average Risk  23.4   11.0        Use the calculated Patient Ratio above and the CHD Risk Table to determine the patient's CHD Risk.        ATP III CLASSIFICATION (LDL):  <100     mg/dL   Optimal  562-130  mg/dL   Near or Above                    Optimal  130-159  mg/dL   Borderline  865-784  mg/dL   High  >696     mg/dL   Very High Performed at [facility]   Prolactin     Status: Abnormal   Collection Time: 11/29/16  6:44 AM  Result Value Ref Range   Prolactin 62.8 (H) 4.8 - 23.3 ng/mL    Comment: (NOTE) Performed At: [facility] [address] [place], [place] [id number] Mila Homer MD [id number] Performed at [facility]     Blood Alcohol level:  Lab Results  Component Value Date   [facility] <5 11/26/2016   ETH 65 (H) 10/11/2016    Metabolic Disorder  Labs: No results found for: HGBA1C, MPG Lab Results   Component Value Date   PROLACTIN 62.8 (H) 11/29/2016   Lab Results  Component Value Date   CHOL 173 11/29/2016   TRIG 84 11/29/2016   HDL 88 11/29/2016   CHOLHDL 2.0 11/29/2016   VLDL 17 11/29/2016   LDLCALC 68 11/29/2016    Physical Findings: AIMS: Facial and Oral Movements Muscles of Facial Expression: None, normal Lips and Perioral Area: None, normal Jaw: None, normal Tongue: None, normal,Extremity Movements Upper (arms, wrists, hands, fingers): None, normal Lower (legs, knees, ankles, toes): None, normal, Trunk Movements Neck, shoulders, hips: None, normal, Overall Severity Severity of abnormal movements (highest score from questions above): None, normal Incapacitation due to abnormal movements: None, normal Patient's awareness of abnormal movements (rate only patient's report): No Awareness, Dental Status Current problems with teeth and/or dentures?: No Does patient usually wear dentures?: No  CIWA:  CIWA-Ar Total: 0 COWS:     Musculoskeletal: Strength & Muscle Tone: within normal limits Gait & Station: normal Patient leans: N/A  Psychiatric Specialty Exam: Physical Exam  Nursing note and vitals reviewed. Constitutional: She is oriented to person, place, and time. She appears well-developed.  Neurological: She is alert and oriented to person, place, and time.  Psychiatric: She has a normal mood and affect. Her behavior is normal.    Review of Systems  Psychiatric/Behavioral: Positive for depression and substance abuse. Negative for suicidal ideas. The patient is nervous/anxious and has insomnia.    denies headache, denies  chest pain, denies shortness of breath, no vomiting  Blood pressure 97/65, pulse 80, temperature 97.8 F (36.6 C), resp. rate 14, height 5\' 2"  (1.575 m), weight 56.7 kg (125 lb), SpO2 100 %.Body mass index is 22.86 kg/m.  General Appearance: Fairly Groomed  Eye Contact:  partially improved, still downcast at times   Speech:  Normal Rate   Volume:  Decreased  Mood:  Depressed  Affect:  constricted, but does smile briefly at times   Thought Process:  Linear  Orientation:  Full (Time, Place, and Person)  Thought Content:  denies hallucinations, no delusions, not internally preoccupied  Suicidal Thoughts:  No   Homicidal Thoughts:  No   Memory:  recent and remote grossly intact   Judgement:  Improving   Insight:  Fair  Psychomotor Activity:  Normal  Concentration:  Concentration: Good and Attention Span: Good  Recall:  Good  Fund of Knowledge:  Good  Language:  Good  Akathisia:  Negative  Handed:  Right  AIMS (if indicated):     Assets:  Desire for Improvement Resilience  ADL's:  Intact  Cognition:  WNL  Sleep:  Number of Hours: 6.5     I agree with current treatment plan on 11/30/2016, Patient seen face-to-face for psychiatric evaluation follow-up, chart reviewed. Reviewed the information documented and agree with the treatment plan.  Treatment Plan Summary: Daily contact with patient to assess and evaluate symptoms and progress in treatment, Medication management, Plan inpatient admission  and medications as below   Nursing care order for patient to program on [address] hall for substance abuse meetings. Encourage group and milieu participation to work on Pharmacologist and symptom reduction Continue Lamictal 25 mgrs QDAY for mood disorder, depression Continue Zyprexa 5 mgrs QHS for mood disorder Continue Trazodone 50 mgrs QHS PRN for insomnia as needed  Continue Ativan 1 mgr Q 6 hours PRN for alcohol withdrawal symptoms as needed  Treatment team working on disposition planning options  Oneta Rackanika N Lewis, NP 11/30/2016, 11:46 AM   Agree with NP Progress Note as above

## 2016-11-30 NOTE — [facility] Group Notes (Signed)
The focus of this group is to educate the patient on the purpose and policies of crisis stabilization and provide a format to answer questions about their admission.  The group details unit policies and expectations of patients while admitted.  Patient attended nurse education orientation group this morning.  Patient actively participated and had appropriate affect.  Patient was alert.  Patient had appropriate insight and appropriate engagement.  Today patient will work on 3 goals for discharge.  

## 2016-11-30 NOTE — Progress Notes (Signed)
Patient attended the evening group session and responded to all discussion prompts from the writer. Patient shared her goal for today was to get through another day and to talk to her son. Patients affect was appropriate and rated her day a 4 out of 10.

## 2016-11-30 NOTE — Progress Notes (Signed)
D:  Patient's self inventory sheet, patient sleeps good, sleep medication helpful.  Fair appetite, low energy level, good concentration  Rated anxiety, depression and hopeless #8.  Withdrawals, tremors, chilling, cramping, irritability in past 24 hours.  Denied SI.  Physical problems, cramping, worst pain #8 in past 24 hours, stomach.  Goal is ability to learn how to cope with situation and to learn new coping skills.  Plans to attend groups. A:  Medications administered per MD orders.  Emotional support and encouragements given patient. R:  Denied SI and HI, contracts for safety.  Denied A/V hallucinations.  Safety maintained with 15 minute checks.

## 2016-12-01 MED ORDER — HYDROXYZINE HCL 25 MG PO TABS
25.0000 mg | ORAL_TABLET | Freq: Four times a day (QID) | ORAL | Status: DC | PRN
  Administered 2016-12-01 – 2016-12-12 (×8): 25 mg via ORAL
  Filled 2016-12-01 (×8): qty 1

## 2016-12-01 NOTE — Progress Notes (Signed)
D    Pt endorses some depression and anxiety    Her main complaint was from her period discomfort    She is cooperative with treatment and is appropriate with her interactions with others  A   Verbal support given    Medications administered and effectiveness monitored    Q 15 min checks R   Pt is safe at present

## 2016-12-01 NOTE — Progress Notes (Signed)
[facility]  12/01/2016 12:31 PM Leslie Sanders  MRN:  [id number]  Subjective:   Patient reports "I'm not good. It is Christmas & I'm not a home. My son is a foster home. I'm still depressed & suicidal".    Objective : Leslie Sanders is awake, alert and oriented x 3. Patient appears flat and guarded. Patient reports chronic depression. States she has a plan to seek help for her drinking.  Denies homicidal ideation. Continues to endorse passive SI she states due to it is Christmas & she is not at home. Denies auditory or visual hallucination and does not appear to be responding to internal stimuli.  Patient reports attending groups session whoever states she is having a heard time staying awake during the sessions. Patient reports he is medication compliant without mediation side effects. States her depression 10/10. Reports a fair appetite. Support, encouragement and reassurance was provided.   Principal Problem: Bipolar disorder, most recent episode depressed (HCC) Diagnosis:   Patient Active Problem List   Diagnosis Date Noted  . Major depression, recurrent (HCC) [F33.9] 11/27/2016  . Bipolar disorder, most recent episode depressed (HCC) [F31.30] 10/12/2016  . Anemia, iron deficiency [D50.9] 10/12/2016  . Alcohol use disorder, moderate, dependence (HCC) [F10.20] 10/12/2016  . Lactose intolerance [E73.9] 10/12/2016   Total Time spent with patient: 15 minutes  Past Medical History:  Past Medical History:  Diagnosis Date  . Depression    History reviewed. No pertinent surgical history. Family History:  Family History  Problem Relation Age of Onset  . Schizophrenia Maternal Aunt   . ADD / ADHD Son   . ODD Son    Social History:  History  Alcohol Use  . 16.8 oz/week  . 28 Cans of beer per week     History  Drug Use No    Social History   Social History  . Marital status: Divorced    Spouse name: N/A  . Number of children: N/A  . Years of education: N/A   Social History  Main Topics  . Smoking status: Current Every Day Smoker    Packs/day: 0.50    Types: Cigarettes  . Smokeless tobacco: Never Used  . Alcohol use 16.8 oz/week    28 Cans of beer per week  . Drug use: No  . Sexual activity: Not Currently   Other Topics Concern  . None   Social History Narrative  . None   Additional Social History:    Pain Medications: Denies Prescriptions: Denies Over the Counter: Denies History of alcohol / drug use?: Yes Longest period of sobriety (when/how long): unknown  Negative Consequences of Use: Financial, Personal relationships Withdrawal Symptoms: Nausea / Vomiting, Irritability, Sweats, Tremors Name of Substance 1: Alcohol 1 - Age of First Use: 29 1 - Amount (size/oz): 6 beers 1 - Frequency: 3x/week 1 - Duration: ongoing 1 - Last Use / Amount: 11/25/16  Sleep: improving   Appetite:  Fair  Current Medications: Current Facility-Administered Medications  Medication Dose Route Frequency Provider Last Rate Last Dose  . alum & mag hydroxide-simeth (MAALOX/MYLANTA) 200-200-20 MG/5ML suspension 30 mL  30 mL Oral Q4H PRN Jackelyn PolingJason A Berry, NP      . hydrOXYzine (ATARAX/VISTARIL) tablet 25 mg  25 mg Oral Q6H PRN Craige CottaFernando A Byrant Valent, MD   25 mg at 12/01/16 0006  . ibuprofen (ADVIL,MOTRIN) tablet 600 mg  600 mg Oral Q8H PRN Thermon LeylandLaura A Davis, NP   600 mg at 12/01/16 1020  . lamoTRIgine (LAMICTAL) tablet  25 mg  25 mg Oral Daily Craige Cotta, MD   25 mg at 12/01/16 1019  . loperamide (IMODIUM) capsule 2-4 mg  2-4 mg Oral PRN Craige Cotta, MD      . LORazepam (ATIVAN) tablet 1 mg  1 mg Oral Q6H PRN Craige Cotta, MD   1 mg at 12/01/16 0006  . magnesium hydroxide (MILK OF MAGNESIA) suspension 30 mL  30 mL Oral Daily PRN Jackelyn Poling, NP      . multivitamin with minerals tablet 1 tablet  1 tablet Oral Daily Craige Cotta, MD   1 tablet at 12/01/16 1019  . nicotine polacrilex (NICORETTE) gum 2 mg  2 mg Oral PRN Craige Cotta, MD      . OLANZapine  (ZYPREXA) tablet 5 mg  5 mg Oral QHS Craige Cotta, MD   5 mg at 12/01/16 0007  . ondansetron (ZOFRAN-ODT) disintegrating tablet 4 mg  4 mg Oral Q6H PRN Craige Cotta, MD      . thiamine (VITAMIN B-1) tablet 100 mg  100 mg Oral Daily Rockey Situ Tahje Borawski, MD   100 mg at 12/01/16 1019  . traZODone (DESYREL) tablet 50 mg  50 mg Oral QHS PRN Jackelyn Poling, NP   50 mg at 12/01/16 0007  . vitamin B-12 (CYANOCOBALAMIN) tablet 2,500 mcg  2,500 mcg Oral Daily Jackelyn Poling, NP   2,500 mcg at 12/01/16 1019   Lab Results:  No results found for this or any previous visit (from the past 48 hour(s)).  Blood Alcohol level:  Lab Results  Component Value Date   ETH <5 11/26/2016   ETH 65 (H) 10/11/2016    Metabolic Disorder Labs: No results found for: HGBA1C, MPG Lab Results  Component Value Date   PROLACTIN 62.8 (H) 11/29/2016   Lab Results  Component Value Date   CHOL 173 11/29/2016   TRIG 84 11/29/2016   HDL 88 11/29/2016   CHOLHDL 2.0 11/29/2016   VLDL 17 11/29/2016   LDLCALC 68 11/29/2016   Physical Findings: AIMS: Facial and Oral Movements Muscles of Facial Expression: None, normal Lips and Perioral Area: None, normal Jaw: None, normal Tongue: None, normal,Extremity Movements Upper (arms, wrists, hands, fingers): None, normal Lower (legs, knees, ankles, toes): None, normal, Trunk Movements Neck, shoulders, hips: None, normal, Overall Severity Severity of abnormal movements (highest score from questions above): None, normal Incapacitation due to abnormal movements: None, normal Patient's awareness of abnormal movements (rate only patient's report): No Awareness, Dental Status Current problems with teeth and/or dentures?: No Does patient usually wear dentures?: No  CIWA:  CIWA-Ar Total: 2 COWS:  COWS Total Score: 1  Musculoskeletal: Strength & Muscle Tone: within normal limits Gait & Station: normal Patient leans: N/A  Psychiatric Specialty Exam: Physical Exam  Nursing  note and vitals reviewed. Constitutional: She is oriented to person, place, and time. She appears well-developed.  Neurological: She is alert and oriented to person, place, and time.  Psychiatric: She has a normal mood and affect. Her behavior is normal.    Review of Systems  Psychiatric/Behavioral: Positive for depression and substance abuse. Negative for suicidal ideas. The patient is nervous/anxious and has insomnia.    denies headache, denies  chest pain, denies shortness of breath, no vomiting  Blood pressure (!) 88/69, pulse 89, temperature 98.3 F (36.8 C), temperature source Oral, resp. rate 16, height 5\' 2"  (1.575 m), weight 56.7 kg (125 lb), SpO2 100 %.Body mass index is 22.86  kg/m.  General Appearance: Fairly Groomed  Eye Contact:  partially improved, still downcast at times   Speech:  Normal Rate  Volume:  Decreased  Mood:  Depressed  Affect:  constricted, but does smile briefly at times   Thought Process:  Linear  Orientation:  Full (Time, Place, and Person)  Thought Content:  denies hallucinations, no delusions, not internally preoccupied  Suicidal Thoughts:  No   Homicidal Thoughts:  No   Memory:  recent and remote grossly intact   Judgement:  Improving   Insight:  Fair  Psychomotor Activity:  Normal  Concentration:  Concentration: Good and Attention Span: Good  Recall:  Good  Fund of Knowledge:  Good  Language:  Good  Akathisia:  Negative  Handed:  Right  AIMS (if indicated):     Assets:  Desire for Improvement Resilience  ADL's:  Intact  Cognition:  WNL  Sleep:  Number of Hours: 4.75     I agree with current treatment plan on 11/30/2016, Patient seen face-to-face for psychiatric evaluation follow-up, chart reviewed. Reviewed the information documented and agree with the treatment plan.  Treatment Plan Summary: Daily contact with patient to assess and evaluate symptoms and progress in treatment, Medication management, Plan inpatient admission  and  medications as below   Nursing care order for patient to program on [address] hall for substance abuse meetings. Encourage group and milieu participation to work on Pharmacologistcoping skills and symptom reduction Continue Lamictal 25 mgrs QDAY for mood disorder, depression Continue Zyprexa 5 mgrs QHS for mood disorder Continue Trazodone 50 mgrs QHS PRN for insomnia as needed  Continue Ativan 1 mgr Q 6 hours PRN for alcohol withdrawal symptoms as needed  Treatment team working on disposition planning options  Sanjuana KavaNwoko, Agnes I, NP, PMHNP, FNP-BC 12/01/2016, 12:31 PMPatient ID: Leslie Sanders, female   DOB: December 30, 1975, 40 y.o.   MRN: [id number] Agree with NP Progress Note as Pecola Lawlessabov

## 2016-12-01 NOTE — Progress Notes (Signed)
Patient ID: Leslie Sanders, female   DOB: Mar 13, 1976, 40 y.o.   MRN: [id number]  DAR: Pt. Denies HI and A/V Hallucinations. She reports passive SI but is able to contract for safety. She describes her sleep as good, appetite fair, energy level low, and concentration good. She rates depression, hopelessness, and anxiety 8/10. She reports, "I'm feeling down today." Patient does report menstrual cramps and received PRN pain medication which provided some relief. Support and encouragement provided to the patient to come to writer with anything the patient may need. She verbalized understanding but remained minimal with Clinical research associatewriter throughout the day. Scheduled medications administered to patient late due to patient not coming to medication window although staff prompted patient a few times. She came to the medication window to receive her scheduled medication and reported not feeling well due to her menstrual cramps. Patient is seen in the milieu intermittently but appears to be keeping to herself Her affect and mood are depressed. Q15 minute checks are maintained for safety.

## 2016-12-01 NOTE — Progress Notes (Cosign Needed)
Adult Psychoeducational Group Note  Date:  12/01/2016 Time:  8:29 PM  Group Topic/Focus:  Wrap-Up Group:   The focus of this group is to help patients review their daily goal of treatment and discuss progress on daily workbooks.   Participation Level:  Active  Participation Quality:  Appropriate  Affect:  Appropriate  Cognitive:  Appropriate  Insight: Appropriate  Engagement in Group:  Engaged  Modes of Intervention:  Discussion  Additional Comments:  Pt stated that she woke up not feeling very well because this was the first Christmas without her son.. Pt stated that her friend came to visit and it really helped to brighten her spirits.  Millicent Blazejewski R Matsue Strom 12/01/2016, 8:29 PM

## 2016-12-02 LAB — HEMOGLOBIN A1C
Hgb A1c MFr Bld: 5.4 % (ref 4.8–5.6)
MEAN PLASMA GLUCOSE: 108 mg/dL

## 2016-12-02 MED ORDER — OLANZAPINE 7.5 MG PO TABS
7.5000 mg | ORAL_TABLET | Freq: Every day | ORAL | Status: DC
  Administered 2016-12-02 – 2016-12-04 (×3): 7.5 mg via ORAL
  Filled 2016-12-02 (×7): qty 1

## 2016-12-02 MED ORDER — TAB-A-VITE/IRON PO TABS
1.0000 | ORAL_TABLET | Freq: Every day | ORAL | Status: DC
  Administered 2016-12-02 – 2016-12-15 (×14): 1 via ORAL
  Filled 2016-12-02 (×16): qty 1

## 2016-12-02 NOTE — Progress Notes (Signed)
Adult Psychoeducational Group Note  Date:  12/02/2016 Time:  9:56 PM  Group Topic/Focus:  Wrap-Up Group:   The focus of this group is to help patients review their daily goal of treatment and discuss progress on daily workbooks.   Participation Level:  Active  Participation Quality:  Appropriate  Affect:  Appropriate  Cognitive:  Oriented  Insight: Good  Engagement in Group:  Engaged  Modes of Intervention:  Activity  Additional Comments:  Patient rated her day a 4. Stated her anxiety was high and that she is still trying to deal with sons condition and come up with coping mechanisms for that. Natasha MeadKiara M Netha Dafoe 12/02/2016, 9:56 PM

## 2016-12-02 NOTE — Progress Notes (Signed)
Nursing Progress Note: 7p-7a D: Pt currently presents with a irritable/depressed affect and behavior. Pt states "I feel pretty bad today because I am on my period. My periods are always heavy, and I cannot stop eating ice. I am very worried about my iron levels" Interacting appropriately with milieu. Pt reports good sleep with current medication regimen.   A: Pt provided with medications per providers orders. Pt's labs and vitals were monitored throughout the night. Pt supported emotionally and encouraged to express concerns and questions. Pt educated on medications.  R: Pt's safety ensured with 15 minute and environmental checks. Pt currently denies SI/HI/Self Harm and A/V hallucinations. Pt verbally contracts to seek staff if SI/HI or A/VH occurs and to consult with staff before acting on any harmful thoughts. Will continue to monitor. \

## 2016-12-02 NOTE — [facility] Group Notes (Signed)
[facility] LCSW Group Therapy 12/02/2016 1:15 PM  Type of Therapy: Group Therapy- Feelings about Diagnosis  Participation Level: Active   Participation Quality:  Appropriate; Monopolizing at times  Affect:  Appropriate  Cognitive: Alert and Oriented   Insight:  Developing   Engagement in Therapy: Developing/Improving and Engaged   Modes of Intervention: Clarification, Confrontation, Discussion, Education, Exploration, Limit-setting, Orientation, Problem-solving, Rapport Building, Dance movement psychotherapisteality Testing, Socialization and Support  Description of Group:   This group will allow patients to explore their thoughts and feelings about diagnoses they have received. Patients will be guided to explore their level of understanding and acceptance of these diagnoses. Facilitator will encourage patients to process their thoughts and feelings about the reactions of others to their diagnosis, and will guide patients in identifying ways to discuss their diagnosis with significant others in their lives. This group will be process-oriented, with patients participating in exploration of their own experiences as well as giving and receiving support and challenge from other group members.  Summary of Progress/Problems:  Pt expressed difficulty coping in healthy ways, reporting that she often resorts to alcohol in order to deal with symptoms of depression and PTSD. Pt reports that multiple stressful social factors have increased her depression and contributed to her recent relapse.   Therapeutic Modalities:   Cognitive Behavioral Therapy Solution Focused Therapy Motivational Interviewing Relapse Prevention Therapy  Vernie ShanksLauren Jajaira Ruis, LCSW 12/02/2016 3:22 PM

## 2016-12-02 NOTE — Progress Notes (Signed)
Recreation Therapy Notes   Animal-Assisted Activity (AAA) Program Checklist/Progress Notes Patient Eligibility Criteria Checklist & Daily Group note for Rec TxIntervention  Date: 12.26.2017 Time: 2:45pm [address]    AAA/T Program Assumption of Risk Form signed by Patient/ or Parent Legal Guardian Yes  Patient is free of allergies or sever asthma Yes  Patient reports no fear of animals Yes  Patient reports no history of cruelty to animals Yes  Patient understands his/her participation is voluntary Yes  Patient washes hands before animal contact Yes  Patient washes hands after animal contact Yes  Behavioral Response: Appropriate   Education:Hand Washing, Appropriate Animal Interaction   Education Outcome: Acknowledges education.   Clinical Observations/Feedback: Patient attended session and interacted appropriately with therapy dog and peers. Patient asked appropriate questions about therapy dog. Patient spoke about her son's recent dx of ASD. She asked many questions about resources in the community for her and her son. LRT provided her information on resources she is aware of.   Leslie Sanders, LRT/CTRS        [name] L 12/02/2016 3:09 PM

## 2016-12-02 NOTE — Progress Notes (Signed)
Patient denied SI and HI, contracts for safety.  Denied A/V hallucinations.  Patient stated she is on the 4th day of her menstrual period.

## 2016-12-02 NOTE — Progress Notes (Signed)
Patient ID: Leslie Sanders, female   DOB: 1976-04-19, 40 y.o.   MRN: [id number] PER STATE REGULATIONS 482.30  THIS CHART WAS REVIEWED FOR MEDICAL NECESSITY WITH RESPECT TO THE PATIENT'S ADMISSION/ DURATION OF STAY.  NEXT REVIEW DATE: 12/05/2016  Willa RoughJENNIFER JONES Aman Bonet, RN, BSN CASE MANAGER
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
## 2016-12-02 NOTE — Progress Notes (Signed)
Patient's self inventory sheet, patient sleeps fair, sleep medication helpful.  Good appetite, low energy level, good concentration.  Rated depression 8, hopeless 7, anxiety 7.5  Withdrawals, tremors, chilling, cramping, irritability.  Denied SI.  Physical problems, weak energy level.  Pain, stomach, bones, worst pain 7.5 in past 24 hours, no pain medication.  Goal is figure out treatment plan.  Plans to talk to SW.  No discharge plans.

## 2016-12-03 DIAGNOSIS — F3130 Bipolar disorder, current episode depressed, mild or moderate severity, unspecified: Secondary | ICD-10-CM

## 2016-12-03 DIAGNOSIS — Z79899 Other long term (current) drug therapy: Secondary | ICD-10-CM

## 2016-12-03 DIAGNOSIS — F17210 Nicotine dependence, cigarettes, uncomplicated: Secondary | ICD-10-CM

## 2016-12-03 MED ORDER — HYDROCORTISONE 0.5 % EX CREA
TOPICAL_CREAM | Freq: Two times a day (BID) | CUTANEOUS | Status: DC
  Administered 2016-12-03 – 2016-12-12 (×8): via TOPICAL
  Filled 2016-12-03: qty 28.35

## 2016-12-03 MED ORDER — IBUPROFEN 800 MG PO TABS
800.0000 mg | ORAL_TABLET | Freq: Three times a day (TID) | ORAL | Status: DC | PRN
  Administered 2016-12-04 – 2016-12-13 (×3): 800 mg via ORAL
  Filled 2016-12-03 (×3): qty 1

## 2016-12-03 MED ORDER — HYDROCORTISONE 0.5 % EX CREA
TOPICAL_CREAM | Freq: Two times a day (BID) | CUTANEOUS | Status: DC
  Filled 2016-12-03: qty 28.35

## 2016-12-03 NOTE — Plan of Care (Signed)
Problem: Safety: Goal: Ability to remain free from injury will improve Q 15 minutes safety checks maintained without incident.  Problem: Medication: Goal: Compliance with prescribed medication regimen will improve Outcome: Progressing Pt is compliant with medications as prescribed. Denies adverse drug reactions when assessed.

## 2016-12-03 NOTE — Progress Notes (Signed)
D: Pt presents animated and in a pleasant mood on approach. Denies SI, HI, VH and pain. Continues to endorse depression and + AH, stated "it's not as harsh though like before but it's still there". Pt reports good night sleep, good appetite and good concentration level. Rates her depression 7/10, hopelessness 6/10 and anxiety 6/10 on self inventory sheet. Visible in milieu majority of this shift. A: Medications administered as prescribed. Emotional support and availability provided. Encouraged pt to voice concerns, comply with medications and attend groups. Q 15 minutes safety checks maintained on and off unit without issues.  R: Pt receptive to care. Compliant with medications as ordered. Denies adverse drug reactions. Attends unit groups. Tolerates all PO intake well. Remains safe on and off unit. POC continues.

## 2016-12-03 NOTE — Progress Notes (Signed)
[facility] MD Progress Note  12/03/2016 3:15 PM LINZY LAURY  MRN:  [id number]  Subjective:   Patient c/o menstrual cramps.  No family member can bring her home medication to hospital  Objective : I have discussed case with treatment team and have met with patient . She presents partially improved , states that she feels her mood did improve but still trying to get adjusted.  Patient has more verbal, better eye contact.  Denies current suicidal ideations. She is visible on unit and is going to groups- no behavioral issues on unit.  Denies medication side effects.  12 /20 Hgb 10.2, HCT 32.9 .   11/4 Hgb 10.1, HCT 32.9   Principal Problem: Bipolar disorder, most recent episode depressed ([place]) Diagnosis:   Patient Active Problem List   Diagnosis Date Noted  . Major depression, recurrent ([place]) [F33.9] 11/27/2016  . Bipolar disorder, most recent episode depressed ([place]) [F31.30] 10/12/2016  . Anemia, iron deficiency [D50.9] 10/12/2016  . Alcohol use disorder, moderate, dependence ([place]) [F10.20] 10/12/2016  . Lactose intolerance [E73.9] 10/12/2016   Total Time spent with patient: 20 minutes  Past Medical History:  Past Medical History:  Diagnosis Date  . Depression    History reviewed. No pertinent surgical history. Family History:  Family History  Problem Relation Age of Onset  . Schizophrenia Maternal Aunt   . ADD / ADHD Son   . ODD Son    Social History:  History  Alcohol Use  . 16.8 oz/week  . 28 Cans of beer per week     History  Drug Use No    Social History   Social History  . Marital status: Divorced    Spouse name: N/A  . Number of children: N/A  . Years of education: N/A   Social History Main Topics  . Smoking status: Current Every Day Smoker    Packs/day: 0.50    Types: Cigarettes  . Smokeless tobacco: Never Used  . Alcohol use 16.8 oz/week    28 Cans of beer per week  . Drug use: No  . Sexual activity: Not Currently   Other Topics Concern  . None    Social History Narrative  . None   Additional Social History:    Pain Medications: Denies Prescriptions: Denies Over the Counter: Denies History of alcohol / drug use?: Yes Longest period of sobriety (when/how long): unknown  Negative Consequences of Use: Financial, Personal relationships Withdrawal Symptoms: Nausea / Vomiting, Irritability, Sweats, Tremors Name of Substance 1: Alcohol 1 - Age of First Use: 29 1 - Amount (size/oz): 6 beers 1 - Frequency: 3x/week 1 - Duration: ongoing 1 - Last Use / Amount: 11/25/16  Sleep: fair  Appetite:  Improving  Current Medications: Current Facility-Administered Medications  Medication Dose Route Frequency Provider Last Rate Last Dose  . alum & mag hydroxide-simeth (MAALOX/MYLANTA) 200-200-20 MG/5ML suspension 30 mL  30 mL Oral Q4H PRN Rozetta Nunnery, NP      . hydrocortisone cream 0.5 %   Topical BID Kerrie Buffalo, NP      . hydrOXYzine (ATARAX/VISTARIL) tablet 25 mg  25 mg Oral Q6H PRN Laverle Hobby, PA-C   25 mg at 12/02/16 2309  . ibuprofen (ADVIL,MOTRIN) tablet 800 mg  800 mg Oral Q8H PRN Kerrie Buffalo, NP      . lamoTRIgine (LAMICTAL) tablet 25 mg  25 mg Oral Daily Jenne Campus, MD   25 mg at 12/03/16 0804  . magnesium hydroxide (MILK OF MAGNESIA) suspension 30  mL  30 mL Oral Daily PRN Rozetta Nunnery, NP      . multivitamins with iron tablet 1 tablet  1 tablet Oral Daily Jenne Campus, MD   1 tablet at 12/03/16 0824  . nicotine polacrilex (NICORETTE) gum 2 mg  2 mg Oral PRN Jenne Campus, MD      . OLANZapine (ZYPREXA) tablet 7.5 mg  7.5 mg Oral QHS Myer Peer Cobos, MD   7.5 mg at 12/02/16 2309  . thiamine (VITAMIN B-1) tablet 100 mg  100 mg Oral Daily Jenne Campus, MD   100 mg at 12/03/16 0804  . traZODone (DESYREL) tablet 50 mg  50 mg Oral QHS PRN Rozetta Nunnery, NP   50 mg at 12/02/16 2309  . vitamin B-12 (CYANOCOBALAMIN) tablet 2,500 mcg  2,500 mcg Oral Daily Rozetta Nunnery, NP   2,500 mcg at 12/03/16 0805   Lab  Results:  No results found for this or any previous visit (from the past 103 hour(s)).  Blood Alcohol level:  Lab Results  Component Value Date   ETH <5 11/26/2016   ETH 65 (H) 29/19/1660    Metabolic Disorder Labs: Lab Results  Component Value Date   HGBA1C 5.4 11/29/2016   MPG 108 11/29/2016   Lab Results  Component Value Date   PROLACTIN 62.8 (H) 11/29/2016   Lab Results  Component Value Date   CHOL 173 11/29/2016   TRIG 84 11/29/2016   HDL 88 11/29/2016   CHOLHDL 2.0 11/29/2016   VLDL 17 11/29/2016   LDLCALC 68 11/29/2016   Physical Findings: AIMS: Facial and Oral Movements Muscles of Facial Expression: None, normal Lips and Perioral Area: None, normal Jaw: None, normal Tongue: None, normal,Extremity Movements Upper (arms, wrists, hands, fingers): None, normal Lower (legs, knees, ankles, toes): None, normal, Trunk Movements Neck, shoulders, hips: None, normal, Overall Severity Severity of abnormal movements (highest score from questions above): None, normal Incapacitation due to abnormal movements: None, normal Patient's awareness of abnormal movements (rate only patient's report): No Awareness, Dental Status Current problems with teeth and/or dentures?: No Does patient usually wear dentures?: No  CIWA:  CIWA-Ar Total: 1 COWS:  COWS Total Score: 1  Musculoskeletal: Strength & Muscle Tone: within normal limits Gait & Station: normal Patient leans: N/A  Psychiatric Specialty Exam: Physical Exam  Nursing note and vitals reviewed. Constitutional: She is oriented to person, place, and time. She appears well-developed.  Neurological: She is alert and oriented to person, place, and time.  Psychiatric: She has a normal mood and affect. Her behavior is normal.    Review of Systems  Psychiatric/Behavioral: Positive for depression and substance abuse. Negative for suicidal ideas. The patient is nervous/anxious and has insomnia.    denies headache, denies  chest  pain, denies shortness of breath, no vomiting  Blood pressure 103/71, pulse 66, temperature 97.9 F (36.6 C), temperature source Oral, resp. rate 16, height '5\' 2"'  (1.575 m), weight 56.7 kg (125 lb), SpO2 100 %.Body mass index is 22.86 kg/m.  General Appearance: Fairly Groomed  Eye Contact:  Good/improved eye contact   Speech:  Normal Rate  Volume:  Normal  Mood: reports ongoing depression   Affect:  Less constricted than on admission  Thought Process:  Linear  Orientation:  Full (Time, Place, and Person)  Thought Content:  denies hallucinations, no delusions, not internally preoccupied  Suicidal Thoughts:  No currently denies suicidal plan or intention and contracts for safety on unit , denies any homicidal  or violent ideations   Homicidal Thoughts:  No   Memory:  recent and remote grossly intact   Judgement:  Improving   Insight:  Fair  Psychomotor Activity:  Normal  Concentration:  Concentration: Good and Attention Span: Good  Recall:  Good  Fund of Knowledge:  Good  Language:  Good  Akathisia:  Negative  Handed:  Right  AIMS (if indicated):     Assets:  Desire for Improvement Resilience  ADL's:  Intact  Cognition:  WNL  Sleep:  Number of Hours: 5.25    Assessment - patient is presenting with partial improvement since admission- she presents better groomed, with improved eye contact and rate of speech, but ongoing depression and ruminations about stressors. No suicidal ideations at this time. Tolerating medications well.  Has history of iron deficiency anemia, but Hgb/HCT stable compared to prior   Treatment Plan Summary: Daily contact with patient to assess and evaluate symptoms and progress in treatment, Medication management, Plan inpatient admission  and medications as below  Encourage group and milieu participation to work on coping skills and symptom reduction Continue Lamictal 25 mgrs QDAY for mood disorder, depression Cont Zyprexa to 7.5 mgrs QHS for mood disorder,  depression, may also improve quality of sleep Continue Trazodone 50 mgrs QHS PRN for insomnia as needed  Continue Ativan 1 mgr Q 6 hours PRN for alcohol withdrawal symptoms as needed  Start MVI /iron supplementation  Added Ibuprofen 800 mg cramps menstrual Added Hydrocortisone 0.5% to affected areas.  She feel some itching discomfort on her face.   Treatment team working on disposition planning options  Janett Labella, NP [facility] 12/03/2016, 3:15 PM

## 2016-12-03 NOTE — Progress Notes (Addendum)
Nursing Progress Note: 7p-7q D: Pt currently presents with a suspicious/depressed/guarded affect and behavior. Pt reports to writer that their goal is to "be more positive and more active in a treatment plan. I need to be a role model to my kid. He's 41 years old and he needs me to be strong. He doesn't have anyone else." Interacting appropriately with milieu. Pt reports good sleep with current medication regimen.   A: Pt provided with medications per providers orders. Pt's labs and vitals were monitored throughout the night. Pt supported emotionally and encouraged to express concerns and questions. Pt educated on medications.  R: Pt's safety ensured with 15 minute and environmental checks. Pt currently denies SI/HI/Self Harm and A/V hallucinations. Pt verbally contracts to seek staff if SI/HI or A/VH occurs and to consult with staff before acting on any harmful thoughts. Will continue to monitor.

## 2016-12-03 NOTE — Progress Notes (Signed)
Recreation Therapy Notes  Date: 12/03/16 Time: 0930 Location: [address] Dayroom  Group Topic: Stress Management  Goal Area(s) Addresses:  Patient will verbalize importance of using healthy stress management.  Patient will identify positive emotions associated with healthy stress management.   Intervention: Calm App  Activity :  Resilience Meditation.  LRT introduced the stress management technique of meditation.  LRT played the meditation from the calm app to engage patients in the activity.  Patients were to follow along as the meditation played to engage in the process.  Education:  Stress Management, Discharge Planning.   Education Outcome: Acknowledges edcuation/In group clarification offered/Needs additional education  Clinical Observations/Feedback: Pt did not attend  group.   Ambrielle Kington, LRT/CTRS         Greer Koeppen A 12/03/2016 12:29 PM 

## 2016-12-03 NOTE — [facility] Group Notes (Signed)
[facility] LCSW Group Therapy 12/03/2016 1:15 PM  Type of Therapy: Group Therapy- Emotion Regulation  Participation Level: Active   Participation Quality:  Appropriate  Affect: Appropriate  Cognitive: Alert and Oriented   Insight:  Developing/Improving  Engagement in Therapy: Developing/Improving and Engaged   Modes of Intervention: Clarification, Confrontation, Discussion, Education, Exploration, Limit-setting, Orientation, Problem-solving, Rapport Building, Dance movement psychotherapisteality Testing, Socialization and Support  Summary of Progress/Problems: The topic for group today was emotional regulation. This group focused on both positive and negative emotion identification and allowed group members to process ways to identify feelings, regulate negative emotions, and find healthy ways to manage internal/external emotions. Group members were asked to reflect on a time when their reaction to an emotion led to a negative outcome and explored how alternative responses using emotion regulation would have benefited them. Group members were also asked to discuss a time when emotion regulation was utilized when a negative emotion was experienced. Pt affect was brighter today and was able to encourage others and express that she is feeling more hopeful. Pt reports that she is working on finding purpose again in her life. Pt was able to demonstrate developing insight as she processed negative emotions with another peer.    Leslie ShanksLauren Sander Speckman, LCSW 12/03/2016 5:08 PM

## 2016-12-03 NOTE — Tx Team (Signed)
Interdisciplinary Treatment and Diagnostic Plan Update  12/03/2016 Time of Session: 9:30am Leslie Sanders MRN: [id number]  Principal Diagnosis: Bipolar disorder, most recent episode depressed [facility](HCC)  Secondary Diagnoses: Principal Problem:   Bipolar disorder, most recent episode depressed (HCC) Active Problems:   Major depression, recurrent (HCC)   Current Medications:  Current Facility-Administered Medications  Medication Dose Route Frequency Provider Last Rate Last Dose  . alum & mag hydroxide-simeth (MAALOX/MYLANTA) 200-200-20 MG/5ML suspension 30 mL  30 mL Oral Q4H PRN Jackelyn PolingJason A Berry, NP      . hydrOXYzine (ATARAX/VISTARIL) tablet 25 mg  25 mg Oral Q6H PRN Kerry HoughSpencer E Simon, PA-C   25 mg at 12/02/16 2309  . ibuprofen (ADVIL,MOTRIN) tablet 600 mg  600 mg Oral Q8H PRN Thermon LeylandLaura A Davis, NP   600 mg at 12/02/16 0841  . lamoTRIgine (LAMICTAL) tablet 25 mg  25 mg Oral Daily Craige CottaFernando A Cobos, MD   25 mg at 12/03/16 0804  . magnesium hydroxide (MILK OF MAGNESIA) suspension 30 mL  30 mL Oral Daily PRN Jackelyn PolingJason A Berry, NP      . multivitamins with iron tablet 1 tablet  1 tablet Oral Daily Craige CottaFernando A Cobos, MD   1 tablet at 12/03/16 0824  . nicotine polacrilex (NICORETTE) gum 2 mg  2 mg Oral PRN Craige CottaFernando A Cobos, MD      . OLANZapine (ZYPREXA) tablet 7.5 mg  7.5 mg Oral QHS Rockey SituFernando A Cobos, MD   7.5 mg at 12/02/16 2309  . thiamine (VITAMIN B-1) tablet 100 mg  100 mg Oral Daily Craige CottaFernando A Cobos, MD   100 mg at 12/03/16 0804  . traZODone (DESYREL) tablet 50 mg  50 mg Oral QHS PRN Jackelyn PolingJason A Berry, NP   50 mg at 12/02/16 2309  . vitamin B-12 (CYANOCOBALAMIN) tablet 2,500 mcg  2,500 mcg Oral Daily Jackelyn PolingJason A Berry, NP   2,500 mcg at 12/03/16 0805   PTA Medications: Prescriptions Prior to Admission  Medication Sig Dispense Refill Last Dose  . acamprosate (CAMPRAL) 333 MG tablet Take 2 tablets (666 mg total) by mouth 3 (three) times daily with meals. (Patient not taking: Reported on 11/27/2016) 180 tablet 0 Not  Taking at Unknown time  . Cyanocobalamin (B-12) 2500 MCG TABS Take 2,500 mcg by mouth daily.  11 Not Taking at Unknown time  . fluticasone (FLONASE) 50 MCG/ACT nasal spray Place 1 spray into the nose daily.   Not Taking at Unknown time  . folic acid (FOLVITE) 1 MG tablet Take 1 mg by mouth daily.  2 Not Taking at Unknown time  . lamoTRIgine (LAMICTAL) 25 MG tablet Take 1 tab (25 mg ) in morning.  Then take 2 tabs (50 mg) in the evening. (Patient not taking: Reported on 11/27/2016) 90 tablet 0 Not Taking  . meloxicam (MOBIC) 15 MG tablet Take 15 mg by mouth daily as needed.   Not Taking at Unknown time  . mirtazapine (REMERON) 7.5 MG tablet Take 1 tablet (7.5 mg total) by mouth at bedtime. (Patient not taking: Reported on 11/27/2016) 30 tablet 0 Not Taking at Unknown time  . nicotine (NICODERM CQ - DOSED IN MG/24 HOURS) 21 mg/24hr patch Place 1 patch (21 mg total) onto the skin daily. (Patient not taking: Reported on 11/27/2016) 28 patch 0 Not Taking at Unknown time  . OLANZapine (ZYPREXA) 7.5 MG tablet Take 1 tablet (7.5 mg total) by mouth at bedtime. (Patient not taking: Reported on 11/27/2016) 30 tablet 0 Not Taking at Unknown time  .  prazosin (MINIPRESS) 1 MG capsule Take 1 capsule (1 mg total) by mouth at bedtime. (Patient not taking: Reported on 11/27/2016) 30 capsule 0 Not Taking at Unknown time  . sertraline (ZOLOFT) 100 MG tablet Take 100 mg by mouth daily.   Not Taking at Unknown time  . tranexamic acid (LYSTEDA) 650 MG TABS tablet Take 1,300 mg by mouth 3 (three) times daily. While on period for five days.   Not Taking at Unknown time  . traZODone (DESYREL) 100 MG tablet Take 100 mg by mouth at bedtime.   Not Taking at Unknown time    Patient Stressors: Marital or family conflict Substance abuse Other: dealing with special needs child  Patient Strengths: Capable of independent living Wellsite geologist fund of knowledge Motivation for treatment/growth Physical  Health  Treatment Modalities: Medication Management, Group therapy, Case management,  1 to 1 session with clinician, Psychoeducation, Recreational therapy.   Physician Treatment Plan for Primary Diagnosis: Bipolar disorder, most recent episode depressed (HCC) Long Term Goal(s): Improvement in symptoms so as ready for discharge Improvement in symptoms so as ready for discharge   Short Term Goals: Ability to identify changes in lifestyle to reduce recurrence of condition will improve Ability to verbalize feelings will improve Ability to disclose and discuss suicidal ideas Ability to identify changes in lifestyle to reduce recurrence of condition will improve Ability to verbalize feelings will improve Ability to demonstrate self-control will improve Compliance with prescribed medications will improve Ability to identify triggers associated with substance abuse/mental health issues will improve  Medication Management: Evaluate patient's response, side effects, and tolerance of medication regimen.  Therapeutic Interventions: 1 to 1 sessions, Unit Group sessions and Medication administration.  Evaluation of Outcomes: Progressing  Physician Treatment Plan for Secondary Diagnosis: Principal Problem:   Bipolar disorder, most recent episode depressed (HCC) Active Problems:   Major depression, recurrent (HCC)  Long Term Goal(s): Improvement in symptoms so as ready for discharge Improvement in symptoms so as ready for discharge   Short Term Goals: Ability to identify changes in lifestyle to reduce recurrence of condition will improve Ability to verbalize feelings will improve Ability to disclose and discuss suicidal ideas Ability to identify changes in lifestyle to reduce recurrence of condition will improve Ability to verbalize feelings will improve Ability to demonstrate self-control will improve Compliance with prescribed medications will improve Ability to identify triggers associated  with substance abuse/mental health issues will improve     Medication Management: Evaluate patient's response, side effects, and tolerance of medication regimen.  Therapeutic Interventions: 1 to 1 sessions, Unit Group sessions and Medication administration.  Evaluation of Outcomes: Progressing   RN Treatment Plan for Primary Diagnosis: Bipolar disorder, most recent episode depressed (HCC) Long Term Goal(s): Knowledge of disease and therapeutic regimen to maintain health will improve  Short Term Goals: Ability to remain free from injury will improve, Ability to disclose and discuss suicidal ideas, Ability to identify and develop effective coping behaviors will improve and Compliance with prescribed medications will improve  Medication Management: RN will administer medications as ordered by provider, will assess and evaluate patient's response and provide education to patient for prescribed medication. RN will report any adverse and/or side effects to prescribing provider.  Therapeutic Interventions: 1 on 1 counseling sessions, Psychoeducation, Medication administration, Evaluate responses to treatment, Monitor vital signs and CBGs as ordered, Perform/monitor CIWA, COWS, AIMS and Fall Risk screenings as ordered, Perform wound care treatments as ordered.  Evaluation of Outcomes: Progressing   LCSW Treatment Plan for  Primary Diagnosis: Bipolar disorder, most recent episode depressed (HCC) Long Term Goal(s): Safe transition to appropriate next level of care at discharge, Engage patient in therapeutic group addressing interpersonal concerns.  Short Term Goals: Engage patient in aftercare planning with referrals and resources, Increase social support, Increase emotional regulation, Identify triggers associated with mental health/substance abuse issues and Increase skills for wellness and recovery  Therapeutic Interventions: Assess for all discharge needs, 1 to 1 time with Social worker, Explore  available resources and support systems, Assess for adequacy in community support network, Educate family and significant other(s) on suicide prevention, Complete Psychosocial Assessment, Interpersonal group therapy.  Evaluation of Outcomes: Progressing   Progress in Treatment :  Attending groups: Yes Participating in groups: Yes Taking medication as prescribed: Yes, MD continuing to assess for appropriate medication regimen Toleration medication: Yes Family/Significant other contact made: No, Pt declines Patient understands diagnosis: Yes Discussing patient identified problems/goals with staff: Yes Medical problems stabilized or resolved: Yes Denies suicidal/homicidal ideation: Yes Issues/concerns per patient self-inventory: None reported Other: N/A  New problem(s) identified: None reported at this time  New Short Term/Long Term Goal(s): None at this time  Discharge Plan or Barriers: Pt is deciding between returning home with outpatient services or seeking residential treatment.   Reason for Continuation of Hospitalization: Anxiety Depression Medication stabilization Withdrawal symptoms  Estimated Length of Stay: 2-4 days    Attendees:  Patient:  Physician: Dr.  Nursing: Hezzie Bumplivette W. Christa D.  12/03/2016 10:27 AM  RN Care Manager:  Social Workers: Vernie ShanksLauren Shawnelle Spoerl LCSW,   12/03/2016 10:27 AM  Nurse Pratictioners: Lorelee MarketMay Augustin, Conrad Withrow, NP 11/28/2016 9:30am Other:     Scribe for Treatment Team: Vernie ShanksLauren Luwana Butrick, LCSW Clinical Social Work [phone]

## 2016-12-03 NOTE — Progress Notes (Signed)
Adult Psychoeducational Group Note  Date:  12/03/2016 Time:  10:55 PM  Group Topic/Focus:  Wrap-Up Group:   The focus of this group is to help patients review their daily goal of treatment and discuss progress on daily workbooks.   Participation Level:  Active  Participation Quality:  Appropriate  Affect:  Appropriate  Cognitive:  Oriented  Insight: Good  Engagement in Group:  Engaged  Modes of Intervention:  Activity  Additional Comments:  Patient rated her day at a 7. Said she had a good day and goal is to work on discharge plan because she is leaving this week.  Natasha MeadKiara M Chrisma Hurlock 12/03/2016, 10:55 PM

## 2016-12-04 MED ORDER — LAMOTRIGINE 25 MG PO TABS
25.0000 mg | ORAL_TABLET | Freq: Two times a day (BID) | ORAL | Status: DC
  Administered 2016-12-04 – 2016-12-10 (×12): 25 mg via ORAL
  Filled 2016-12-04 (×14): qty 1

## 2016-12-04 NOTE — [facility] Group Notes (Signed)
[facility] 12/04/2016 1:15pm  Type of Therapy: Mental Health Association Presentation  Participation Level: Active  Participation Quality: Attentive  Affect: Appropriate  Cognitive: Oriented  Insight: Developing/Improving  Engagement in Therapy: Engaged  Modes of Intervention: Discussion, Education and Socialization  Summary of Progress/Problems: Mental Health Association (MHA) Speaker came to talk about his personal journey with substance abuse and addiction. The pt processed ways by which to relate to the speaker. MHA speaker provided handouts and educational information pertaining to groups and services offered by the [facility]. Pt was engaged in speaker's presentation and was receptive to resources provided.    Vernie ShanksLauren Perfecto Purdy, LCSW 12/04/2016 1:20 PM

## 2016-12-04 NOTE — Progress Notes (Signed)
D: Patient denies SI/HI and A/V hallucinations; patient reports that she did not sleep well last night; patient reports that she woke up off and on  A: Monitored q 15 minutes; patient encouraged to attend groups; patient educated about medications; patient given medications per physician orders; patient encouraged to express feelings and/or concerns  R: Patient is flat; patient engages with peers; patient is assertive; patient is attending groups; patient is concerned about follow up care

## 2016-12-04 NOTE — Progress Notes (Signed)
Nursing Progress Note 7p-7a  D) Patient presents with flat affect and is minimal with Clinical research associatewriter. Patient denies SI/HI/AVH or pain. Patient forwards little when prompted. Patient contracts for safety at this time.   A) Emotional support given. Patient medicated as prescribed. Patient on q15 min safety checks. Opportunities for questions or concerns presented to patient. Patient encouraged to continue to work on goals of treatment.  R) Patient receptive to interaction with nurse. Patient remains safe on the unit at this time. Patient is resting in bed without complaints. Will continue to monitor.

## 2016-12-04 NOTE — Plan of Care (Signed)
Problem: Activity: Goal: Interest or engagement in leisure activities will improve Outcome: Progressing Patient is seen attending groups and participating. Patient is interactive with peers in milieu and assertive to staff.

## 2016-12-04 NOTE — Progress Notes (Signed)
[facility] MD Progress Note  12/04/2016 2:27 PM Leslie Sanders  MRN:  [id number]  Subjective:   Patient continues to c/o menstrual cramps.  No family member can bring her home medication to hospital  Objective : I have discussed case with treatment team and have met with patient . She presents partially improved , states that she feels her mood did improve but still trying to get adjusted.  Patient has more verbal, better eye contact.  Denies current suicidal ideations. She is visible on unit and is going to groups- no behavioral issues on unit.  Denies medication side effects.  12 /20 Hgb 10.2, HCT 32.9 .   11/4 Hgb 10.1, HCT 32.9   Principal Problem: Bipolar disorder, most recent episode depressed ([place]) Diagnosis:   Patient Active Problem List   Diagnosis Date Noted  . Major depression, recurrent ([place]) [F33.9] 11/27/2016  . Bipolar disorder, most recent episode depressed ([place]) [F31.30] 10/12/2016  . Anemia, iron deficiency [D50.9] 10/12/2016  . Alcohol use disorder, moderate, dependence ([place]) [F10.20] 10/12/2016  . Lactose intolerance [E73.9] 10/12/2016   Total Time spent with patient: 20 minutes  Past Medical History:  Past Medical History:  Diagnosis Date  . Depression    History reviewed. No pertinent surgical history. Family History:  Family History  Problem Relation Age of Onset  . Schizophrenia Maternal Aunt   . ADD / ADHD Son   . ODD Son    Social History:  History  Alcohol Use  . 16.8 oz/week  . 28 Cans of beer per week     History  Drug Use No    Social History   Social History  . Marital status: Divorced    Spouse name: N/A  . Number of children: N/A  . Years of education: N/A   Social History Main Topics  . Smoking status: Current Every Day Smoker    Packs/day: 0.50    Types: Cigarettes  . Smokeless tobacco: Never Used  . Alcohol use 16.8 oz/week    28 Cans of beer per week  . Drug use: No  . Sexual activity: Not Currently   Other Topics Concern   . None   Social History Narrative  . None   Additional Social History:    Pain Medications: Denies Prescriptions: Denies Over the Counter: Denies History of alcohol / drug use?: Yes Longest period of sobriety (when/how long): unknown  Negative Consequences of Use: Financial, Personal relationships Withdrawal Symptoms: Nausea / Vomiting, Irritability, Sweats, Tremors Name of Substance 1: Alcohol 1 - Age of First Use: 29 1 - Amount (size/oz): 6 beers 1 - Frequency: 3x/week 1 - Duration: ongoing 1 - Last Use / Amount: 11/25/16  Sleep: fair  Appetite:  Improving  Current Medications: Current Facility-Administered Medications  Medication Dose Route Frequency Provider Last Rate Last Dose  . alum & mag hydroxide-simeth (MAALOX/MYLANTA) 200-200-20 MG/5ML suspension 30 mL  30 mL Oral Q4H PRN Rozetta Nunnery, NP      . hydrocortisone cream 0.5 %   Topical BID Laverle Hobby, PA-C      . hydrOXYzine (ATARAX/VISTARIL) tablet 25 mg  25 mg Oral Q6H PRN Laverle Hobby, PA-C   25 mg at 12/03/16 1722  . ibuprofen (ADVIL,MOTRIN) tablet 800 mg  800 mg Oral Q8H PRN Kerrie Buffalo, NP   800 mg at 12/04/16 0836  . lamoTRIgine (LAMICTAL) tablet 25 mg  25 mg Oral Daily Jenne Campus, MD   25 mg at 12/04/16 0837  . magnesium  hydroxide (MILK OF MAGNESIA) suspension 30 mL  30 mL Oral Daily PRN Rozetta Nunnery, NP      . multivitamins with iron tablet 1 tablet  1 tablet Oral Daily Jenne Campus, MD   1 tablet at 12/04/16 2774  . nicotine polacrilex (NICORETTE) gum 2 mg  2 mg Oral PRN Jenne Campus, MD      . OLANZapine (ZYPREXA) tablet 7.5 mg  7.5 mg Oral QHS Myer Peer Cobos, MD   7.5 mg at 12/03/16 2256  . thiamine (VITAMIN B-1) tablet 100 mg  100 mg Oral Daily Jenne Campus, MD   100 mg at 12/04/16 0837  . traZODone (DESYREL) tablet 50 mg  50 mg Oral QHS PRN Rozetta Nunnery, NP   50 mg at 12/03/16 2256  . vitamin B-12 (CYANOCOBALAMIN) tablet 2,500 mcg  2,500 mcg Oral Daily Rozetta Nunnery, NP    2,500 mcg at 12/04/16 1287   Lab Results:  No results found for this or any previous visit (from the past 62 hour(s)).  Blood Alcohol level:  Lab Results  Component Value Date   ETH <5 11/26/2016   ETH 65 (H) 86/76/7209    Metabolic Disorder Labs: Lab Results  Component Value Date   HGBA1C 5.4 11/29/2016   MPG 108 11/29/2016   Lab Results  Component Value Date   PROLACTIN 62.8 (H) 11/29/2016   Lab Results  Component Value Date   CHOL 173 11/29/2016   TRIG 84 11/29/2016   HDL 88 11/29/2016   CHOLHDL 2.0 11/29/2016   VLDL 17 11/29/2016   LDLCALC 68 11/29/2016   Physical Findings: AIMS: Facial and Oral Movements Muscles of Facial Expression: None, normal Lips and Perioral Area: None, normal Jaw: None, normal Tongue: None, normal,Extremity Movements Upper (arms, wrists, hands, fingers): None, normal Lower (legs, knees, ankles, toes): None, normal, Trunk Movements Neck, shoulders, hips: None, normal, Overall Severity Severity of abnormal movements (highest score from questions above): None, normal Incapacitation due to abnormal movements: None, normal Patient's awareness of abnormal movements (rate only patient's report): No Awareness, Dental Status Current problems with teeth and/or dentures?: No Does patient usually wear dentures?: No  CIWA:  CIWA-Ar Total: 1 COWS:  COWS Total Score: 1  Musculoskeletal: Strength & Muscle Tone: within normal limits Gait & Station: normal Patient leans: N/A  Psychiatric Specialty Exam: Physical Exam  Nursing note and vitals reviewed. Constitutional: She is oriented to person, place, and time. She appears well-developed.  Neurological: She is alert and oriented to person, place, and time.  Psychiatric: She has a normal mood and affect. Her behavior is normal.    Review of Systems  Psychiatric/Behavioral: Positive for depression and substance abuse. Negative for suicidal ideas. The patient is nervous/anxious and has insomnia.     denies headache, denies  chest pain, denies shortness of breath, no vomiting  Blood pressure 107/67, pulse 78, temperature 98.2 F (36.8 C), temperature source Oral, resp. rate 16, height '5\' 2"'  (1.575 m), weight 56.7 kg (125 lb), SpO2 100 %.Body mass index is 22.86 kg/m.  General Appearance: Fairly Groomed  Eye Contact:  Good/improved eye contact   Speech:  Normal Rate  Volume:  Normal  Mood: reports ongoing depression   Affect:  Less constricted than on admission  Thought Process:  Linear  Orientation:  Full (Time, Place, and Person)  Thought Content:  denies hallucinations, no delusions, not internally preoccupied  Suicidal Thoughts:  No currently denies suicidal plan or intention and contracts for safety  on unit , denies any homicidal or violent ideations   Homicidal Thoughts:  No   Memory:  recent and remote grossly intact   Judgement:  Improving   Insight:  Fair  Psychomotor Activity:  Normal  Concentration:  Concentration: Good and Attention Span: Good  Recall:  Good  Fund of Knowledge:  Good  Language:  Good  Akathisia:  Negative  Handed:  Right  AIMS (if indicated):     Assets:  Desire for Improvement Resilience  ADL's:  Intact  Cognition:  WNL  Sleep:  Number of Hours: 5.5   Assessment - patient is presenting with partial improvement since admission- she presents better groomed, with improved eye contact and rate of speech, but ongoing depression and ruminations about stressors. No suicidal ideations at this time. Tolerating medications well.  Has history of iron deficiency anemia, but Hgb/HCT stable compared to prior   Treatment Plan Summary: Daily contact with patient to assess and evaluate symptoms and progress in treatment, Medication management, Plan inpatient admission  and medications as below  Encourage group and milieu participation to work on coping skills and symptom reduction Increase Lamictal 25 mgrs to BID dosing for mood disorder, depression Cont Zyprexa  to 7.5 mgrs QHS for mood disorder, depression, may also improve quality of sleep Continue Trazodone 50 mgrs QHS PRN for insomnia as needed  Continue Ativan 1 mgr Q 6 hours PRN for alcohol withdrawal symptoms as needed  Start MVI /iron supplementation  Added Ibuprofen 800 mg cramps menstrual Added Hydrocortisone 0.5% to affected areas.  She feel some itching discomfort on her face.   Treatment team working on disposition planning options  Janett Labella, NP [facility] 12/04/2016, 2:27 PM

## 2016-12-05 ENCOUNTER — Encounter (HOSPITAL_COMMUNITY): Payer: Self-pay | Admitting: Psychiatry

## 2016-12-05 DIAGNOSIS — Z818 Family history of other mental and behavioral disorders: Secondary | ICD-10-CM

## 2016-12-05 MED ORDER — ARIPIPRAZOLE 5 MG PO TABS
5.0000 mg | ORAL_TABLET | Freq: Every day | ORAL | Status: DC
  Administered 2016-12-05 – 2016-12-09 (×5): 5 mg via ORAL
  Filled 2016-12-05 (×7): qty 1

## 2016-12-05 MED ORDER — TRAZODONE HCL 100 MG PO TABS
100.0000 mg | ORAL_TABLET | Freq: Every evening | ORAL | Status: DC | PRN
  Administered 2016-12-05 – 2016-12-08 (×4): 100 mg via ORAL
  Filled 2016-12-05 (×4): qty 1

## 2016-12-05 NOTE — Progress Notes (Signed)
Nursing Progress Note 7p-7a  D) Patient presents with flat affect but is pleasant and cooperative. Patient is seen interacting with peers in the dayroom. Patient is assertive to staff and at times demanding. Patient interrupts Clinical research associatewriter at times when speaking with other patients to be included in the conversation. Patient states "the medicine isn't enough" and says "I know you can't do nothing about that". Patient doesn't elaborate and tells writer "i'm fine".  Patient denies SI/HI or AVH. Patient reports cramping due to menstruation and states "ibuprofen doesn't help". Patient contracts for safety at this time.   A) Emotional support given. Patient medicated as prescribed. Patient on q15 min safety checks. Opportunities for questions or concerns presented to patient. Patient encouraged to continue to work on goals of treatment.  R) Patient receptive o to interaction with nurse. Patient remains safe on the unit at this time. Patient is resting in bed without complaints. Will continue to monitor.

## 2016-12-05 NOTE — Progress Notes (Signed)
Recreation Therapy Notes  Date: 12/05/16 Time: 0930 Location: 300 [address] Dayroom  Group Topic: Stress Management  Goal Area(s) Addresses:  Patient will verbalize importance of using healthy stress management.  Patient will identify positive emotions associated with healthy stress management.   Intervention: Stress Management  Activity :  Progressive Muscle Relaxation.  LRT introduced the stress management technique of progressive muscle relaxation.  LRT read a script to patients could partake in the activity.  Patients were to follow along as LRT read script.  Education:  Stress Management, Discharge Planning.   Education Outcome: Acknowledges edcuation/In group clarification offered/Needs additional education  Clinical Observations/Feedback: Pt did not attend  group.   Sophia Cubero, LRT/CTRS         Mathieu Schloemer A 12/05/2016 12:32 PM 

## 2016-12-05 NOTE — Progress Notes (Signed)
D: Patient denies SI/HI and A/V hallucinations;  rates depression as 7/10; rates hopelessness 5/10; rates anxiety as 6/10;   A: Monitored q 15 minutes; patient encouraged to attend groups; patient educated about medications; patient given medications per physician orders; patient encouraged to express feelings and/or concerns  R: Patient is cooperative; patient is anxious at times; patient's interaction with staff and peers is appropriate; patient was able to set goal to talk with staff 1:1 when having feelings of SI; patient is taking medications as prescribed and tolerating medications; patient is attending all groups

## 2016-12-05 NOTE — Plan of Care (Signed)
Problem: Health Behavior/Discharge Planning: Goal: Compliance with treatment plan for underlying cause of condition will improve Outcome: Progressing Patient complying with goals of treatment. Patient is attending groups, taking medicines and interacting with peers in the milieu.

## 2016-12-05 NOTE — [facility] Group Notes (Signed)
[place] LCSW Group Therapy 12/05/2016 1:15pm  Type of Therapy: Group Therapy- Feelings Around Relapse and Recovery  Participation Level: Active   Participation Quality:  Appropriate  Affect:  Appropriate  Cognitive: Alert and Oriented   Insight:  Developing   Engagement in Therapy: Developing/Improving and Engaged   Modes of Intervention: Clarification, Confrontation, Discussion, Education, Exploration, Limit-setting, Orientation, Problem-solving, Rapport Building, Art therapist, Socialization and Support  Summary of Progress/Problems: The topic for today was feelings about relapse. The group discussed what relapse prevention is to them and identified triggers that they are on the path to relapse. Members also processed their feeling towards relapse and were able to relate to common experiences. Group also discussed coping skills that can be used for relapse prevention.  Pt expressed that loneliness is a trigger for relapse. Pt interacted well with peers and processed the different needs that are met by negative behaviors. Pt was also able to process the consequences of negative behavior and identified her children as her motivation.   Therapeutic Modalities:   Cognitive Behavioral Therapy Solution-Focused Therapy Assertiveness Training Relapse Prevention Therapy    Leslie Sanders [phone] 12/05/2016 3:44 PM

## 2016-12-05 NOTE — Progress Notes (Signed)
[facility] MD Progress Note  12/05/2016 10:02 AM Leslie Sanders  MRN:  [id number]  Subjective:   I don't think my medicine working.  I'm not sleeping good.  I feel weak and tired.  I have nightmares and headaches.    Objective : Patient seen chart reviewed.  Patient continued to endorse depression, passive suicidal thoughts and does not feel her current medicine is working.  She feels restless at night.  She endorse anxiety, nightmares, headache and preoccupied with her somatic complaints.  She continues to feel guilty about her 51 year old son who now diagnosed with autism .  She is not sure how to handle this diagnosis.  Patient had placed her 27 years old son in a foster care 2 months ago because she could not handle his outburst and behavior.  Patient is going to the groups but she does not participate in discussion and does not verbalize and feeling very much.  She admitted feeling lonely, isolated, withdrawn and like to change her medication.  She continues to feel very irritable, labile mood and frustrated.  She is taking Zyprexa and Lamictal.  She has no rash or itching.  She has no tremors or shakes. Labs reviewed ;12 /20 Hgb 10.2, HCT 32.9 .   11/4 Hgb 10.1, HCT 32.9   Principal Problem: Bipolar disorder, most recent episode depressed (HCC) Diagnosis:   Patient Active Problem List   Diagnosis Date Noted  . Major depression, recurrent (HCC) [F33.9] 11/27/2016  . Bipolar disorder, most recent episode depressed (HCC) [F31.30] 10/12/2016  . Anemia, iron deficiency [D50.9] 10/12/2016  . Alcohol use disorder, moderate, dependence (HCC) [F10.20] 10/12/2016  . Lactose intolerance [E73.9] 10/12/2016   Total Time spent with patient: 20 minutes  Past Medical History:  Past Medical History:  Diagnosis Date  . Depression    History reviewed. No pertinent surgical history. Family History:  Family History  Problem Relation Age of Onset  . Schizophrenia Maternal Aunt   . ADD / ADHD Son   . ODD Son     Social History:  History  Alcohol Use  . 16.8 oz/week  . 28 Cans of beer per week     History  Drug Use No    Social History   Social History  . Marital status: Divorced    Spouse name: N/A  . Number of children: N/A  . Years of education: N/A   Social History Main Topics  . Smoking status: Current Every Day Smoker    Packs/day: 0.50    Types: Cigarettes  . Smokeless tobacco: Never Used  . Alcohol use 16.8 oz/week    28 Cans of beer per week  . Drug use: No  . Sexual activity: Not Currently   Other Topics Concern  . None   Social History Narrative  . None   Additional Social History:    Pain Medications: Denies Prescriptions: Denies Over the Counter: Denies History of alcohol / drug use?: Yes Longest period of sobriety (when/how long): unknown  Negative Consequences of Use: Financial, Personal relationships Withdrawal Symptoms: Nausea / Vomiting, Irritability, Sweats, Tremors Name of Substance 1: Alcohol 1 - Age of First Use: 29 1 - Amount (size/oz): 6 beers 1 - Frequency: 3x/week 1 - Duration: ongoing 1 - Last Use / Amount: 11/25/16  Sleep: Poor Appetite:  Unchanged   Current Medications: Current Facility-Administered Medications  Medication Dose Route Frequency Provider Last Rate Last Dose  . alum & mag hydroxide-simeth (MAALOX/MYLANTA) 200-200-20 MG/5ML suspension 30 mL  30 mL  Oral Q4H PRN Jackelyn PolingJason A Berry, NP      . hydrocortisone cream 0.5 %   Topical BID Kerry HoughSpencer E Simon, PA-C      . hydrOXYzine (ATARAX/VISTARIL) tablet 25 mg  25 mg Oral Q6H PRN Kerry HoughSpencer E Simon, PA-C   25 mg at 12/03/16 1722  . ibuprofen (ADVIL,MOTRIN) tablet 800 mg  800 mg Oral Q8H PRN Adonis BrookSheila Agustin, NP   800 mg at 12/05/16 [id number]  . lamoTRIgine (LAMICTAL) tablet 25 mg  25 mg Oral BID Adonis BrookSheila Agustin, NP   25 mg at 12/05/16 0819  . magnesium hydroxide (MILK OF MAGNESIA) suspension 30 mL  30 mL Oral Daily PRN Jackelyn PolingJason A Berry, NP      . multivitamins with iron tablet 1 tablet  1 tablet  Oral Daily Craige CottaFernando A Cobos, MD   1 tablet at 12/05/16 [id number]  . nicotine polacrilex (NICORETTE) gum 2 mg  2 mg Oral PRN Craige CottaFernando A Cobos, MD      . OLANZapine (ZYPREXA) tablet 7.5 mg  7.5 mg Oral QHS Rockey SituFernando A Cobos, MD   7.5 mg at 12/04/16 2254  . thiamine (VITAMIN B-1) tablet 100 mg  100 mg Oral Daily Craige CottaFernando A Cobos, MD   100 mg at 12/05/16 0819  . traZODone (DESYREL) tablet 50 mg  50 mg Oral QHS PRN Jackelyn PolingJason A Berry, NP   50 mg at 12/04/16 2255  . vitamin B-12 (CYANOCOBALAMIN) tablet 2,500 mcg  2,500 mcg Oral Daily Jackelyn PolingJason A Berry, NP   2,500 mcg at 12/05/16 [id number]   Lab Results:  No results found for this or any previous visit (from the past 48 hour(s)).  Blood Alcohol level:  Lab Results  Component Value Date   ETH <5 11/26/2016   ETH 65 (H) 10/11/2016    Metabolic Disorder Labs: Lab Results  Component Value Date   HGBA1C 5.4 11/29/2016   MPG 108 11/29/2016   Lab Results  Component Value Date   PROLACTIN 62.8 (H) 11/29/2016   Lab Results  Component Value Date   CHOL 173 11/29/2016   TRIG 84 11/29/2016   HDL 88 11/29/2016   CHOLHDL 2.0 11/29/2016   VLDL 17 11/29/2016   LDLCALC 68 11/29/2016   Physical Findings: AIMS: Facial and Oral Movements Muscles of Facial Expression: None, normal Lips and Perioral Area: None, normal Jaw: None, normal Tongue: None, normal,Extremity Movements Upper (arms, wrists, hands, fingers): None, normal Lower (legs, knees, ankles, toes): None, normal, Trunk Movements Neck, shoulders, hips: None, normal, Overall Severity Severity of abnormal movements (highest score from questions above): None, normal Incapacitation due to abnormal movements: None, normal Patient's awareness of abnormal movements (rate only patient's report): No Awareness, Dental Status Current problems with teeth and/or dentures?: No Does patient usually wear dentures?: No  CIWA:  CIWA-Ar Total: 5 COWS:  COWS Total Score: 1  Musculoskeletal: Strength & Muscle Tone: within  normal limits Gait & Station: normal Patient leans: N/A  Psychiatric Specialty Exam: Physical Exam  Nursing note and vitals reviewed. Constitutional: She is oriented to person, place, and time. She appears well-developed.  Neurological: She is alert and oriented to person, place, and time.  Psychiatric: She has a normal mood and affect. Her behavior is normal.    Review of Systems  Constitutional: Positive for malaise/fatigue.  HENT: Negative.   Respiratory: Negative.   Cardiovascular: Negative.   Musculoskeletal: Negative.   Skin: Negative.   Neurological: Positive for headaches.  Psychiatric/Behavioral: Positive for depression and substance abuse. Negative for suicidal ideas. The  patient is nervous/anxious and has insomnia.    denies headache, denies  chest pain, denies shortness of breath, no vomiting  Blood pressure 105/68, pulse 66, temperature 98.2 F (36.8 C), temperature source Oral, resp. rate 18, height 5\' 2"  (1.575 m), weight 56.7 kg (125 lb), SpO2 100 %.Body mass index is 22.86 kg/m.  General Appearance: Casual  Eye Contact:  Fair  Speech:  Normal Rate  Volume:  Normal  Mood: Depressed, anxious.    Affect:  Restricted and constricted.    Thought Process:  Linear  Orientation:  Full (Time, Place, and Person)  Thought Content:  denies hallucinations, no delusions, not internally preoccupied  Suicidal Thoughts:  Passive and fleeting suicidal thoughts but no plan or any intent.  Contracts for safety in the unit   Homicidal Thoughts:  No   Memory:  Immediate;   Fair Recent;   Fair Remote;   Fair  Judgement:  Fair   Insight:  Fair  Psychomotor Activity:  Normal  Concentration:  Concentration: Fair and Attention Span: Fair  Recall:  Good  Fund of Knowledge:  Good  Language:  Good  Akathisia:  Negative  Handed:  Right  AIMS (if indicated):     Assets:  Desire for Improvement Resilience  ADL's:  Intact  Cognition:  WNL  Sleep:  Number of Hours: 5     Assessment - Leslie Sanders is 40 year old African-American female who continues to have depression , anxiety, irritability, mood swing with poor sleep.  She has passive and fleeting suicidal thoughts but no plan.    Treatment Plan Summary: Daily contact with patient to assess and evaluate symptoms and progress in treatment, Medication management, Plan inpatient admission  and medications as below  I will discontinue Zyprexa since patient is not seeing any improvement.   We will start Abilify 5 mg daily , continue Lamictal 50 mg daily.  She has no rash, itching or any shakes. Increase trazodone 100 mg at bedtime for insomnia.  Continue Ativan 1 mgr Q 6 hours PRN for alcohol withdrawal symptoms as needed  Continue multivitamin/iron supplementation  Continue Ibuprofen 800 mg cramps menstrual Treatment team working on disposition planning options. Encouraged to participate in group milieu therapy.  Discussed medication side effects and benefits.  Leslie Banos T., MD [facility] 12/05/2016, 10:02 AM

## 2016-12-06 MED ORDER — POTASSIUM CHLORIDE CRYS ER 20 MEQ PO TBCR
20.0000 meq | EXTENDED_RELEASE_TABLET | Freq: Once | ORAL | Status: AC
  Administered 2016-12-06: 20 meq via ORAL
  Filled 2016-12-06 (×2): qty 1

## 2016-12-06 NOTE — Progress Notes (Signed)
Nursing Progress Note: 7p-7a D: Pt currently presents with a anxious/depressed/irratable/gaurded affect and behavior. Pt states "I deal with all sorts of sh*t at home, but I'm coping well. I'm just really depressed that no one has come to visit." Interacting appropriately with milieu. Pt reports ok sleep with current medication regimen.   A: Pt provided with medications per providers orders. Pt's labs and vitals were monitored throughout the night. Pt supported emotionally and encouraged to express concerns and questions. Pt educated on medications.  R: Pt's safety ensured with 15 minute and environmental checks. Pt currently denies SI/HI/Self Harm and A/V hallucinations. Pt verbally contracts to seek staff if SI/HI or A/VH occurs and to consult with staff before acting on any harmful thoughts. Will continue to monitor.

## 2016-12-06 NOTE — Progress Notes (Signed)
Patient ID: Leslie Sanders, female   DOB: [birth year], 40 y.o.   MRN: [id number]  Pt currently presents with a flat affect and guarded behavior. Pt has limited interaction with Clinical research associatewriter, interacts positively with peers. Pt remains in the dayroom for most of the day. Pt reports good sleep with current medication regimen.   Pt provided with medications per providers orders. Pt's labs and vitals were monitored throughout the night. Pt supported emotionally and encouraged to express concerns and questions. Pt educated on medications.  Pt's safety ensured with 15 minute and environmental checks. Pt currently denies SI/HI and A/V hallucinations. Pt verbally agrees to seek staff if SI/HI or A/VH occurs and to consult with staff before acting on any harmful thoughts. Will continue POC.

## 2016-12-06 NOTE — [facility] Group Notes (Signed)
Life Skills  Date:  12/06/2016  Time:  1100  Type of Therapy:  Nurse Education  /  L:ife Skills: The group focuses on teaching patients how to identify their needs and then how to get their needs met with new coping skills.  Participation Level:  Active  Participation Quality:  Attentive  Affect:  Appropriate  Cognitive:  Alert  Insight:  Good  Engagement in Group:  Engaged  Modes of Intervention:  Education  Summary of Progress/Problems:  Lauralyn Primes 12/06/2016, 1:59 PM

## 2016-12-06 NOTE — Progress Notes (Signed)
[facility] MD Progress Note  12/06/2016 11:00 AM Leslie Sanders  MRN:  [id number]  Subjective:  "I feel really stressed out. I'm worried about my iron."  Objective: Pt seen and chart reviewed. Pt is alert/oriented x4, calm, cooperative, and appropriate to situation. Pt denies suicidal/homicidal ideation and psychosis and does not appear to be responding to internal stimuli. Pt reports nightmares and that she feels very tired. Will recheck her iron and start iron as necessary.    Principal Problem: Bipolar disorder, most recent episode depressed (HCC) Diagnosis:   Patient Active Problem List   Diagnosis Date Noted  . Major depression, recurrent (HCC) [F33.9] 11/27/2016  . Bipolar disorder, most recent episode depressed (HCC) [F31.30] 10/12/2016  . Anemia, iron deficiency [D50.9] 10/12/2016  . Alcohol use disorder, moderate, dependence (HCC) [F10.20] 10/12/2016  . Lactose intolerance [E73.9] 10/12/2016   Total Time spent with patient: 20 minutes  Past Medical History:  Past Medical History:  Diagnosis Date  . Depression    History reviewed. No pertinent surgical history. Family History:  Family History  Problem Relation Age of Onset  . Schizophrenia Maternal Aunt   . ADD / ADHD Son   . ODD Son    Social History:  History  Alcohol Use  . 16.8 oz/week  . 28 Cans of beer per week     History  Drug Use No    Social History   Social History  . Marital status: Divorced    Spouse name: N/A  . Number of children: N/A  . Years of education: N/A   Social History Main Topics  . Smoking status: Current Every Day Smoker    Packs/day: 0.50    Types: Cigarettes  . Smokeless tobacco: Never Used  . Alcohol use 16.8 oz/week    28 Cans of beer per week  . Drug use: No  . Sexual activity: Not Currently   Other Topics Concern  . None   Social History Narrative  . None   Additional Social History:    Pain Medications: Denies Prescriptions: Denies Over the Counter:  Denies History of alcohol / drug use?: Yes Longest period of sobriety (when/how long): unknown  Negative Consequences of Use: Financial, Personal relationships Withdrawal Symptoms: Nausea / Vomiting, Irritability, Sweats, Tremors Name of Substance 1: Alcohol 1 - Age of First Use: 29 1 - Amount (size/oz): 6 beers 1 - Frequency: 3x/week 1 - Duration: ongoing 1 - Last Use / Amount: 11/25/16  Sleep: Poor Appetite:  Unchanged   Current Medications: Current Facility-Administered Medications  Medication Dose Route Frequency Provider Last Rate Last Dose  . alum & mag hydroxide-simeth (MAALOX/MYLANTA) 200-200-20 MG/5ML suspension 30 mL  30 mL Oral Q4H PRN Jackelyn Poling, NP      . ARIPiprazole (ABILIFY) tablet 5 mg  5 mg Oral Daily Cleotis Nipper, MD   5 mg at 12/05/16 1101  . hydrocortisone cream 0.5 %   Topical BID Kerry Hough, PA-C      . hydrOXYzine (ATARAX/VISTARIL) tablet 25 mg  25 mg Oral Q6H PRN Kerry Hough, PA-C   25 mg at 12/05/16 2311  . ibuprofen (ADVIL,MOTRIN) tablet 800 mg  800 mg Oral Q8H PRN Adonis Brook, NP   800 mg at 12/05/16 6962  . lamoTRIgine (LAMICTAL) tablet 25 mg  25 mg Oral BID Adonis Brook, NP   25 mg at 12/05/16 1729  . magnesium hydroxide (MILK OF MAGNESIA) suspension 30 mL  30 mL Oral Daily PRN Jackelyn Poling, NP      .  multivitamins with iron tablet 1 tablet  1 tablet Oral Daily Craige CottaFernando A Cobos, MD   1 tablet at 12/05/16 [id number]  . nicotine polacrilex (NICORETTE) gum 2 mg  2 mg Oral PRN Craige CottaFernando A Cobos, MD      . thiamine (VITAMIN B-1) tablet 100 mg  100 mg Oral Daily Craige CottaFernando A Cobos, MD   100 mg at 12/05/16 0819  . traZODone (DESYREL) tablet 100 mg  100 mg Oral QHS PRN Cleotis NipperSyed T Arfeen, MD   100 mg at 12/05/16 2311  . vitamin B-12 (CYANOCOBALAMIN) tablet 2,500 mcg  2,500 mcg Oral Daily Jackelyn PolingJason A Berry, NP   2,500 mcg at 12/05/16 [id number]   Lab Results:  No results found for this or any previous visit (from the past 48 hour(s)).  Blood Alcohol level:  Lab  Results  Component Value Date   ETH <5 11/26/2016   ETH 65 (H) 10/11/2016    Metabolic Disorder Labs: Lab Results  Component Value Date   HGBA1C 5.4 11/29/2016   MPG 108 11/29/2016   Lab Results  Component Value Date   PROLACTIN 62.8 (H) 11/29/2016   Lab Results  Component Value Date   CHOL 173 11/29/2016   TRIG 84 11/29/2016   HDL 88 11/29/2016   CHOLHDL 2.0 11/29/2016   VLDL 17 11/29/2016   LDLCALC 68 11/29/2016   Physical Findings: AIMS: Facial and Oral Movements Muscles of Facial Expression: None, normal Lips and Perioral Area: None, normal Jaw: None, normal Tongue: None, normal,Extremity Movements Upper (arms, wrists, hands, fingers): None, normal Lower (legs, knees, ankles, toes): None, normal, Trunk Movements Neck, shoulders, hips: None, normal, Overall Severity Severity of abnormal movements (highest score from questions above): None, normal Incapacitation due to abnormal movements: None, normal Patient's awareness of abnormal movements (rate only patient's report): No Awareness, Dental Status Current problems with teeth and/or dentures?: No Does patient usually wear dentures?: No  CIWA:  CIWA-Ar Total: 3 COWS:  COWS Total Score: 1  Musculoskeletal: Strength & Muscle Tone: within normal limits Gait & Station: normal Patient leans: N/A  Psychiatric Specialty Exam: Physical Exam  Nursing note and vitals reviewed. Constitutional: She is oriented to person, place, and time. She appears well-developed.  Neurological: She is alert and oriented to person, place, and time.  Psychiatric: She has a normal mood and affect. Her behavior is normal.    Review of Systems  Constitutional: Positive for malaise/fatigue.  HENT: Negative.   Respiratory: Negative.   Cardiovascular: Negative.   Musculoskeletal: Negative.   Skin: Negative.   Psychiatric/Behavioral: Positive for depression and substance abuse. Negative for suicidal ideas. The patient is nervous/anxious  and has insomnia.   All other systems reviewed and are negative.  denies headache, denies  chest pain, denies shortness of breath, no vomiting  Blood pressure 104/63, pulse 63, temperature 98.2 F (36.8 C), temperature source Oral, resp. rate 16, height 5\' 2"  (1.575 m), weight 56.7 kg (125 lb), SpO2 100 %.Body mass index is 22.86 kg/m.  General Appearance: Casual and Fairly Groomed  Eye Contact:  Poor  Speech:  Normal Rate  Volume:  Normal  Mood: Depressed, anxious.    Affect:  Restricted and constricted.    Thought Process:  Linear  Orientation:  Full (Time, Place, and Person)  Thought Content:  denies hallucinations, no delusions, not internally preoccupied  Suicidal Thoughts:  No  Homicidal Thoughts:  No   Memory:  Immediate;   Fair Recent;   Fair Remote;   Fair  Judgement:  Fair  Insight:  Fair  Psychomotor Activity:  Normal  Concentration:  Concentration: Fair and Attention Span: Fair  Recall:  Good  Fund of Knowledge:  Good  Language:  Good  Akathisia:  Negative  Handed:  Right  AIMS (if indicated):     Assets:  Desire for Improvement Resilience  ADL's:  Intact  Cognition:  WNL  Sleep:  Number of Hours: 5.5    Treatment Plan Summary: Bipolar disorder, most recent episode depressed (HCC) unstable, managed as below:  Medications:  Abilify 5mg  po daily for mood stabilization Lamictal 50mg  po daily for anxiety Trazodone 100mg  po qhs prn insomnia Ativan 1mg  po q6h prn ETOH symptoms  Daily contact with patient to assess and evaluate symptoms and progress in treatment, Medication management, Plan inpatient admission  and medications as below   Beau FannyWithrow, Malikhi Ogan C, FNP [facility] 12/06/2016, 11:00 AM

## 2016-12-06 NOTE — Progress Notes (Signed)
NSG 7a-7p shift:   D:  Pt. Has been mildly irritable at times this shift, but otherwise compliant with treatment.  She talked about [place] being [place] difficulties as a cause of her worsening depression.  Pt expressed concern with not having had her "iron and blood levels checked in days".  She states that she has also needed to chew/crush ice more.  A: Support, education, and encouragement provided as needed.  Level 3 checks continued for safety.  R: Pt. receptive to intervention/s.  Safety maintained.  Joaquin MusicMary Charod Slawinski, RN

## 2016-12-06 NOTE — [facility] Group Notes (Signed)
[facility] LCSW Group Therapy Note  12/06/2016  and  10:00 - 11:10 AM  Type of Therapy and Topic:  Group Therapy: Avoiding Self-Sabotaging and Enabling Behaviors  Participation Level:  Active  Participation Quality:  Monopolizing and Sharing  Affect:  Defensive  Cognitive:  Alert and Oriented  Insight:  Developing  Engagement in Therapy:  Developing   Therapeutic models used: Cognitive Behavioral Therapy,  Person-Centered Therapy and Motivational Interviewing  Modes of Intervention:  Discussion, Exploration, Orientation, Rapport Building, Socialization and Support   Summary of Progress/Problems:  The main focus of today's process group was for the patient to identify ways in which they have in the past sabotaged their own progress. Motivational Interviewing was utilized to identify motivation they may have for wanting to change. The Stages of Change were explained using a handout, and patients identified where they currently are with regard to stages of change. Patient reported she is in action stage to avoid relapse and is currently focusing more intention on herself while special needs child has temporary care.     Carney Bernatherine C Aariyana Manz, LCSW

## 2016-12-06 NOTE — [facility] Group Notes (Signed)
Goals Group  Date:  12/06/2016  Time:  0930  Type of Therapy:  Nurse Education  :  Goals Group: The group focuses on teaching patients how to set attainable daily goals that will aid them in tehir recovery.  Participation Level:  Active  Participation Quality:  Attentive  Affect:  Appropriate  Cognitive:  Appropriate  Insight:  Improving  Engagement in Group:  Engaged  Modes of Intervention:  Education  Summary of Progress/Problems:  Rich BraveDuke, Watson Robarge Lynn 12/06/2016, 10:54 AM

## 2016-12-07 LAB — COMPREHENSIVE METABOLIC PANEL
ALBUMIN: 3.4 g/dL — AB (ref 3.5–5.0)
ALT: 26 U/L (ref 14–54)
AST: 39 U/L (ref 15–41)
Alkaline Phosphatase: 38 U/L (ref 38–126)
Anion gap: 7 (ref 5–15)
BUN: 6 mg/dL (ref 6–20)
CHLORIDE: 105 mmol/L (ref 101–111)
CO2: 28 mmol/L (ref 22–32)
CREATININE: 0.56 mg/dL (ref 0.44–1.00)
Calcium: 8.6 mg/dL — ABNORMAL LOW (ref 8.9–10.3)
GFR calc Af Amer: >60 mL/min (ref >60)
GFR calc non Af Amer: >60 mL/min (ref >60)
Glucose, Bld: 80 mg/dL (ref 65–99)
Potassium: 4.2 mmol/L (ref 3.5–5.1)
SODIUM: 140 mmol/L (ref 135–145)
Total Bilirubin: 0.4 mg/dL (ref 0.3–1.2)
Total Protein: 6.4 g/dL — ABNORMAL LOW (ref 6.5–8.1)

## 2016-12-07 LAB — IRON AND TIBC
Iron: 21 ug/dL — ABNORMAL LOW (ref 28–170)
Saturation Ratios: 3 % — ABNORMAL LOW (ref 10.4–31.8)
TIBC: 634 ug/dL — ABNORMAL HIGH (ref 250–450)
UIBC: 613 ug/dL

## 2016-12-07 MED ORDER — PRAZOSIN HCL 1 MG PO CAPS
1.0000 mg | ORAL_CAPSULE | Freq: Every day | ORAL | Status: DC
  Administered 2016-12-07 – 2016-12-14 (×8): 1 mg via ORAL
  Filled 2016-12-07 (×10): qty 1

## 2016-12-07 MED ORDER — FERROUS SULFATE 325 (65 FE) MG PO TABS
325.0000 mg | ORAL_TABLET | Freq: Two times a day (BID) | ORAL | Status: DC
  Administered 2016-12-07 – 2016-12-15 (×16): 325 mg via ORAL
  Filled 2016-12-07 (×20): qty 1

## 2016-12-07 NOTE — [facility] Group Notes (Signed)
[facility] LCSW Group Therapy  12/07/2016 10 AM  Type of Therapy:  Group Therapy  Participation Level:  Active  Participation Quality:  Drowsy and Sharing  Affect:  Irritable  Cognitive:  Alert and Oriented  Insight:  Developing/Improving  Engagement in Therapy:  Developing/Improving  Modes of Intervention:  Activity, Clarification, Discussion, Socialization and Support  Summary of Progress/Problems: Topic for today was thoughts and feelings regarding discharge. We discussed fears of upcoming changes including judgements, expectations and stigma of mental health issues. We then discussed supports: what constitutes a supportive framework, identification of supports and what to do when others are not supportive. Pt engaged easily during group session. As patients processed their anxiety about discharge and described healthy supports patient  Patient found multile faultes with several community supports mentioned.  Patient chose a visual to represent decompensation as being overwhelmed and improvement as being supported.   Carney Bernatherine C Harrill, LCSW

## 2016-12-07 NOTE — Progress Notes (Signed)
[facility] MD Progress Note  12/07/2016 4:44 PM Leslie Sanders  MRN:  [id number]  Subjective:  "I feel really stressed out. I'm worried about my iron."  Objective: Pt seen and chart reviewed. Pt is alert/oriented x4, calm, cooperative, and appropriate to situation. Pt denies suicidal/homicidal ideation and psychosis and does not appear to be responding to internal stimuli. Pt reports nightmares and that she feels very tired. Pt is worried about her iron levels.    Principal Problem: Bipolar disorder, most recent episode depressed ([place]) Diagnosis:   Patient Active Problem List   Diagnosis Date Noted  . Major depression, recurrent ([place]) [F33.9] 11/27/2016  . Bipolar disorder, most recent episode depressed ([place]) [F31.30] 10/12/2016  . Anemia, iron deficiency [D50.9] 10/12/2016  . Alcohol use disorder, moderate, dependence ([place]) [F10.20] 10/12/2016  . Lactose intolerance [E73.9] 10/12/2016   Total Time spent with patient: 15 minutes  Past Medical History:  Past Medical History:  Diagnosis Date  . Depression    History reviewed. No pertinent surgical history. Family History:  Family History  Problem Relation Age of Onset  . Schizophrenia Maternal Aunt   . ADD / ADHD Son   . ODD Son    Social History:  History  Alcohol Use  . 16.8 oz/week  . 28 Cans of beer per week     History  Drug Use No    Social History   Social History  . Marital status: Divorced    Spouse name: N/A  . Number of children: N/A  . Years of education: N/A   Social History Main Topics  . Smoking status: Current Every Day Smoker    Packs/day: 0.50    Types: Cigarettes  . Smokeless tobacco: Never Used  . Alcohol use 16.8 oz/week    28 Cans of beer per week  . Drug use: No  . Sexual activity: Not Currently   Other Topics Concern  . None   Social History Narrative  . None   Additional Social History:    Pain Medications: Denies Prescriptions: Denies Over the Counter: Denies History of alcohol /  drug use?: Yes Longest period of sobriety (when/how long): unknown  Negative Consequences of Use: Financial, Personal relationships Withdrawal Symptoms: Nausea / Vomiting, Irritability, Sweats, Tremors Name of Substance 1: Alcohol 1 - Age of First Use: 29 1 - Amount (size/oz): 6 beers 1 - Frequency: 3x/week 1 - Duration: ongoing 1 - Last Use / Amount: 11/25/16  Sleep: Poor Appetite:  Unchanged   Current Medications: Current Facility-Administered Medications  Medication Dose Route Frequency Provider Last Rate Last Dose  . alum & mag hydroxide-simeth (MAALOX/MYLANTA) 200-200-20 MG/5ML suspension 30 mL  30 mL Oral Q4H PRN Rozetta Nunnery, NP      . ARIPiprazole (ABILIFY) tablet 5 mg  5 mg Oral Daily Kathlee Nations, MD   5 mg at 12/07/16 0839  . ferrous sulfate tablet 325 mg  325 mg Oral BID WC Benjamine Mola, FNP      . hydrocortisone cream 0.5 %   Topical BID Laverle Hobby, PA-C      . hydrOXYzine (ATARAX/VISTARIL) tablet 25 mg  25 mg Oral Q6H PRN Laverle Hobby, PA-C   25 mg at 12/05/16 2311  . ibuprofen (ADVIL,MOTRIN) tablet 800 mg  800 mg Oral Q8H PRN Kerrie Buffalo, NP   800 mg at 12/05/16 5456  . lamoTRIgine (LAMICTAL) tablet 25 mg  25 mg Oral BID Kerrie Buffalo, NP   25 mg at 12/07/16 0839  .  magnesium hydroxide (MILK OF MAGNESIA) suspension 30 mL  30 mL Oral Daily PRN Rozetta Nunnery, NP   30 mL at 12/06/16 1911  . multivitamins with iron tablet 1 tablet  1 tablet Oral Daily Jenne Campus, MD   1 tablet at 12/07/16 [phone]  . nicotine polacrilex (NICORETTE) gum 2 mg  2 mg Oral PRN Jenne Campus, MD      . thiamine (VITAMIN B-1) tablet 100 mg  100 mg Oral Daily Myer Peer Cobos, MD   100 mg at 12/07/16 0840  . traZODone (DESYREL) tablet 100 mg  100 mg Oral QHS PRN Kathlee Nations, MD   100 mg at 12/06/16 2229  . vitamin B-12 (CYANOCOBALAMIN) tablet 2,500 mcg  2,500 mcg Oral Daily Rozetta Nunnery, NP   2,500 mcg at 12/07/16 0840   Lab Results:  Results for orders placed or performed  during the hospital encounter of 11/27/16 (from the past 48 hour(s))  Iron and TIBC     Status: Abnormal   Collection Time: 12/07/16  6:18 AM  Result Value Ref Range   Iron 21 (L) 28 - 170 ug/dL   TIBC 634 (H) 250 - 450 ug/dL   Saturation Ratios 3 (L) 10.4 - 31.8 %   UIBC 613 ug/dL    Comment: Performed at [facility]  Comprehensive metabolic panel     Status: Abnormal   Collection Time: 12/07/16  6:18 AM  Result Value Ref Range   Sodium 140 135 - 145 mmol/L   Potassium 4.2 3.5 - 5.1 mmol/L   Chloride 105 101 - 111 mmol/L   CO2 28 22 - 32 mmol/L   Glucose, Bld 80 65 - 99 mg/dL   BUN 6 6 - 20 mg/dL   Creatinine, Ser 0.56 0.44 - 1.00 mg/dL   Calcium 8.6 (L) 8.9 - 10.3 mg/dL   Total Protein 6.4 (L) 6.5 - 8.1 g/dL   Albumin 3.4 (L) 3.5 - 5.0 g/dL   AST 39 15 - 41 U/L   ALT 26 14 - 54 U/L   Alkaline Phosphatase 38 38 - 126 U/L   Total Bilirubin 0.4 0.3 - 1.2 mg/dL   GFR calc non Af Amer >60 >60 mL/min   GFR calc Af Amer >60 >60 mL/min    Comment: (NOTE) The eGFR has been calculated using the CKD EPI equation. This calculation has not been validated in all clinical situations. eGFR's persistently <60 mL/min signify possible Chronic Kidney Disease.    Anion gap 7 5 - 15    Comment: Performed at [facility]    Blood Alcohol level:  Lab Results  Component Value Date   ETH <5 11/26/2016   ETH 65 (H) 93/81/0175    Metabolic Disorder Labs: Lab Results  Component Value Date   HGBA1C 5.4 11/29/2016   MPG 108 11/29/2016   Lab Results  Component Value Date   PROLACTIN 62.8 (H) 11/29/2016   Lab Results  Component Value Date   CHOL 173 11/29/2016   TRIG 84 11/29/2016   HDL 88 11/29/2016   CHOLHDL 2.0 11/29/2016   VLDL 17 11/29/2016   LDLCALC 68 11/29/2016   Physical Findings: AIMS: Facial and Oral Movements Muscles of Facial Expression: None, normal Lips and Perioral Area: None, normal Jaw: None, normal Tongue: None, normal,Extremity  Movements Upper (arms, wrists, hands, fingers): None, normal Lower (legs, knees, ankles, toes): None, normal, Trunk Movements Neck, shoulders, hips: None, normal, Overall Severity Severity of abnormal movements (highest  score from questions above): None, normal Incapacitation due to abnormal movements: None, normal Patient's awareness of abnormal movements (rate only patient's report): No Awareness, Dental Status Current problems with teeth and/or dentures?: No Does patient usually wear dentures?: No  CIWA:  CIWA-Ar Total: 1 COWS:  COWS Total Score: 1  Musculoskeletal: Strength & Muscle Tone: within normal limits Gait & Station: normal Patient leans: N/A  Psychiatric Specialty Exam: Physical Exam  Nursing note and vitals reviewed. Constitutional: She is oriented to person, place, and time. She appears well-developed.  Neurological: She is alert and oriented to person, place, and time.  Psychiatric: She has a normal mood and affect. Her behavior is normal.    Review of Systems  Constitutional: Positive for malaise/fatigue.  HENT: Negative.   Respiratory: Negative.   Cardiovascular: Negative.   Musculoskeletal: Negative.   Skin: Negative.   Psychiatric/Behavioral: Positive for depression and substance abuse. Negative for suicidal ideas. The patient is nervous/anxious and has insomnia.   All other systems reviewed and are negative.  denies headache, denies  chest pain, denies shortness of breath, no vomiting  Blood pressure 106/64, pulse 82, temperature 97.8 F (36.6 C), temperature source Oral, resp. rate 16, height _0  (1.575 m), weight 56.7 kg (125 lb), SpO2 100 %.Body mass index is 22.86 kg/m.  General Appearance: Casual and Fairly Groomed  Eye Contact:  Poor yet improving  Speech:  Normal Rate, clear  Volume:  Normal  Mood: depressed, anxious, yet improving.    Affect:  Restricted and constricted, more reactive today  Thought Process:  Linear, goal oriented   Orientation:  Full (Time, Place, and Person)  Thought Content:  denies hallucinations, no delusions, not internally preoccupied  Suicidal Thoughts:  No  Homicidal Thoughts:  No   Memory:  Immediate;   Fair Recent;   Fair Remote;   Fair  Judgement:  Fair   Insight:  Fair  Psychomotor Activity:  Normal  Concentration:  Concentration: Fair and Attention Span: Fair  Recall:  Good  Fund of Knowledge:  Good  Language:  Good  Akathisia:  Negative  Handed:  Right  AIMS (if indicated):     Assets:  Desire for Improvement Resilience  ADL's:  Intact  Cognition:  WNL  Sleep:  Number of Hours: 6    Treatment Plan Summary: Bipolar disorder, most recent episode depressed ([place]) unstable, managed as below:  Reviewed on 12/07/16, ordering Iron and TIBC to start Ferrous 313m tomorrow depending on levels.  *Update, levels resulted low at 21, will start Ferrous sulfate 3217mpo bid wc  Medications:  Abilify 70m16mo daily for mood stabilization Lamictal 11m17m daily for anxiety Trazodone 100mg32mqhs prn insomnia Ativan 1mg p46m6h prn ETOH symptoms  Daily contact with patient to assess and evaluate symptoms and progress in treatment, Medication management, Plan inpatient admission  and medications as below   WithroBenjamine MolaBC 12/[facility]/2017, 4:44 PM

## 2016-12-07 NOTE — Progress Notes (Signed)
Leslie Sanders is status quo...she has spent much of her day in the dayroom. She watches TV, interacts with the other patients and is seen talking amongst the patients much of the day. A She completed her daily assessment and on it she wrote she deneid SI today and she rated her depression, hopelessness anda xneity " 06/10/60/2", respectively. R Safetyin place.

## 2016-12-07 NOTE — Progress Notes (Signed)
Adult Psychoeducational Group Note  Date:  12/07/2016 Time:  8:23 PM  Group Topic/Focus:  Wrap-Up Group:   The focus of this group is to help patients review their daily goal of treatment and discuss progress on daily workbooks.   Participation Level:  Active  Participation Quality:  Appropriate  Affect:  Appropriate  Cognitive:  Appropriate  Insight: Good  Engagement in Group:  Engaged  Modes of Intervention:  Discussion  Additional Comments:  Pt stated she had a good day today. Caswell CorwinOwen, Alem Fahl C 12/07/2016, 8:23 PM

## 2016-12-08 NOTE — Progress Notes (Signed)
Adult Psychoeducational Group Note  Date:  12/08/2016 Time:  8:00 PM  Group Topic/Focus:  Wrap-Up Group:   The focus of this group is to help patients review their daily goal of treatment and discuss progress on daily workbooks.   Participation Level:  Active  Participation Quality:  Appropriate and Supportive  Affect:  Appropriate  Cognitive:  Alert  Insight: Good  Engagement in Group:  Engaged  Modes of Intervention:  Discussion and Education  Additional Comments:  Patient reported working on "staying calm."  Patient reported feelings side of effects of her medication.  Patient stated that she felt herself being "depressed" after dinner, resulting in her staying in her room to read a book.    Elmore GuiseSLOAN, Miosha Behe N 12/08/2016, 8:57 PM

## 2016-12-08 NOTE — Tx Team (Signed)
Interdisciplinary Treatment and Diagnostic Plan Update  12/08/2016 Time of Session: 10:00 AM Leslie Sanders MRN: [id number]  Principal Diagnosis: Bipolar disorder, most recent episode depressed ([facility])  Secondary Diagnoses: Principal Problem:   Bipolar disorder, most recent episode depressed ([facility]) Active Problems:   Major depression, recurrent ([facility])   Current Medications:  Current Facility-Administered Medications  Medication Dose Route Frequency Provider Last Rate Last Dose  . alum & mag hydroxide-simeth (MAALOX/MYLANTA) 200-200-20 MG/5ML suspension 30 mL  30 mL Oral Q4H PRN Jackelyn Poling, NP      . ARIPiprazole (ABILIFY) tablet 5 mg  5 mg Oral Daily Cleotis Nipper, MD   5 mg at 12/08/16 0911  . ferrous sulfate tablet 325 mg  325 mg Oral BID WC Beau Fanny, FNP   325 mg at 12/08/16 7829  . hydrocortisone cream 0.5 %   Topical BID Kerry Hough, PA-C      . hydrOXYzine (ATARAX/VISTARIL) tablet 25 mg  25 mg Oral Q6H PRN Kerry Hough, PA-C   25 mg at 12/05/16 2311  . ibuprofen (ADVIL,MOTRIN) tablet 800 mg  800 mg Oral Q8H PRN Adonis Brook, NP   800 mg at 12/05/16 5621  . lamoTRIgine (LAMICTAL) tablet 25 mg  25 mg Oral BID Adonis Brook, NP   25 mg at 12/08/16 0911  . magnesium hydroxide (MILK OF MAGNESIA) suspension 30 mL  30 mL Oral Daily PRN Jackelyn Poling, NP   30 mL at 12/06/16 1911  . multivitamins with iron tablet 1 tablet  1 tablet Oral Daily Craige Cotta, MD   1 tablet at 12/08/16 3086  . nicotine polacrilex (NICORETTE) gum 2 mg  2 mg Oral PRN Craige Cotta, MD      . prazosin (MINIPRESS) capsule 1 mg  1 mg Oral QHS Beau Fanny, FNP   1 mg at 12/07/16 2333  . thiamine (VITAMIN B-1) tablet 100 mg  100 mg Oral Daily Craige Cotta, MD   100 mg at 12/08/16 0911  . traZODone (DESYREL) tablet 100 mg  100 mg Oral QHS PRN Cleotis Nipper, MD   100 mg at 12/07/16 2333  . vitamin B-12 (CYANOCOBALAMIN) tablet 2,500 mcg  2,500 mcg Oral Daily Jackelyn Poling, NP   2,500 mcg at  12/08/16 0910   PTA Medications: Prescriptions Prior to Admission  Medication Sig Dispense Refill Last Dose  . acamprosate (CAMPRAL) 333 MG tablet Take 2 tablets (666 mg total) by mouth 3 (three) times daily with meals. (Patient not taking: Reported on 11/27/2016) 180 tablet 0 Not Taking at Unknown time  . Cyanocobalamin (B-12) 2500 MCG TABS Take 2,500 mcg by mouth daily.  11 Not Taking at Unknown time  . fluticasone (FLONASE) 50 MCG/ACT nasal spray Place 1 spray into the nose daily.   Not Taking at Unknown time  . folic acid (FOLVITE) 1 MG tablet Take 1 mg by mouth daily.  2 Not Taking at Unknown time  . lamoTRIgine (LAMICTAL) 25 MG tablet Take 1 tab (25 mg ) in morning.  Then take 2 tabs (50 mg) in the evening. (Patient not taking: Reported on 11/27/2016) 90 tablet 0 Not Taking  . meloxicam (MOBIC) 15 MG tablet Take 15 mg by mouth daily as needed.   Not Taking at Unknown time  . mirtazapine (REMERON) 7.5 MG tablet Take 1 tablet (7.5 mg total) by mouth at bedtime. (Patient not taking: Reported on 11/27/2016) 30 tablet 0 Not Taking at Unknown time  .  nicotine (NICODERM CQ - DOSED IN MG/24 HOURS) 21 mg/24hr patch Place 1 patch (21 mg total) onto the skin daily. (Patient not taking: Reported on 11/27/2016) 28 patch 0 Not Taking at Unknown time  . OLANZapine (ZYPREXA) 7.5 MG tablet Take 1 tablet (7.5 mg total) by mouth at bedtime. (Patient not taking: Reported on 11/27/2016) 30 tablet 0 Not Taking at Unknown time  . prazosin (MINIPRESS) 1 MG capsule Take 1 capsule (1 mg total) by mouth at bedtime. (Patient not taking: Reported on 11/27/2016) 30 capsule 0 Not Taking at Unknown time  . sertraline (ZOLOFT) 100 MG tablet Take 100 mg by mouth daily.   Not Taking at Unknown time  . tranexamic acid (LYSTEDA) 650 MG TABS tablet Take 1,300 mg by mouth 3 (three) times daily. While on period for five days.   Not Taking at Unknown time  . traZODone (DESYREL) 100 MG tablet Take 100 mg by mouth at bedtime.   Not  Taking at Unknown time    Patient Stressors: Marital or family conflict Substance abuse Other: dealing with special needs child  Patient Strengths: Capable of independent living Wellsite geologistCommunication skills General fund of knowledge Motivation for treatment/growth Physical Health  Treatment Modalities: Medication Management, Group therapy, Case management,  1 to 1 session with clinician, Psychoeducation, Recreational therapy.   Physician Treatment Plan for Primary Diagnosis: Bipolar disorder, most recent episode depressed (H[facility]) Long Term Goal(s): Improvement in symptoms so as ready for discharge Improvement in symptoms so as ready for discharge   Short Term Goals: Ability to identify changes in lifestyle to reduce recurrence of condition will improve Ability to verbalize feelings will improve Ability to disclose and discuss suicidal ideas Ability to identify changes in lifestyle to reduce recurrence of condition will improve Ability to verbalize feelings will improve Ability to demonstrate self-control will improve Compliance with prescribed medications will improve Ability to identify triggers associated with substance abuse/mental health issues will improve  Medication Management: Evaluate patient's response, side effects, and tolerance of medication regimen.  Therapeutic Interventions: 1 to 1 sessions, Unit Group sessions and Medication administration.  Evaluation of Outcomes: Progressing  Physician Treatment Plan for Secondary Diagnosis: Principal Problem:   Bipolar disorder, most recent episode depressed (H[facility]) Active Problems:   Major depression, recurrent (H[facility])  Long Term Goal(s): Improvement in symptoms so as ready for discharge Improvement in symptoms so as ready for discharge   Short Term Goals: Ability to identify changes in lifestyle to reduce recurrence of condition will improve Ability to verbalize feelings will improve Ability to disclose and discuss suicidal  ideas Ability to identify changes in lifestyle to reduce recurrence of condition will improve Ability to verbalize feelings will improve Ability to demonstrate self-control will improve Compliance with prescribed medications will improve Ability to identify triggers associated with substance abuse/mental health issues will improve     Medication Management: Evaluate patient's response, side effects, and tolerance of medication regimen.  Therapeutic Interventions: 1 to 1 sessions, Unit Group sessions and Medication administration.  Evaluation of Outcomes: Progressing   RN Treatment Plan for Primary Diagnosis: Bipolar disorder, most recent episode depressed (HCC) Long Term Goal(s): Knowledge of disease and therapeutic regimen to maintain health will improve  Short Term Goals: Ability to remain free from injury will improve, Ability to disclose and discuss suicidal ideas, Ability to identify and develop effective coping behaviors will improve and Compliance with prescribed medications will improve  Medication Management: RN will administer medications as ordered by provider, will assess and evaluate patient's response  and provide education to patient for prescribed medication. RN will report any adverse and/or side effects to prescribing provider.  Therapeutic Interventions: 1 on 1 counseling sessions, Psychoeducation, Medication administration, Evaluate responses to treatment, Monitor vital signs and CBGs as ordered, Perform/monitor CIWA, COWS, AIMS and Fall Risk screenings as ordered, Perform wound care treatments as ordered.  Evaluation of Outcomes: Progressing   LCSW Treatment Plan for Primary Diagnosis: Bipolar disorder, most recent episode depressed (HCC) Long Term Goal(s): Safe transition to appropriate next level of care at discharge, Engage patient in therapeutic group addressing interpersonal concerns.  Short Term Goals: Engage patient in aftercare planning with referrals and  resources, Increase social support, Increase emotional regulation, Identify triggers associated with mental health/substance abuse issues and Increase skills for wellness and recovery  Therapeutic Interventions: Assess for all discharge needs, 1 to 1 time with Social worker, Explore available resources and support systems, Assess for adequacy in community support network, Educate family and significant other(s) on suicide prevention, Complete Psychosocial Assessment, Interpersonal group therapy.  Evaluation of Outcomes: Progressing   Progress in Treatment :  Attending groups: Yes Participating in groups: Yes Taking medication as prescribed: Yes, MD continuing to assess for appropriate medication regimen Toleration medication: Yes Family/Significant other contact made: No, Pt declines Patient understands diagnosis: Yes Discussing patient identified problems/goals with staff: Yes Medical problems stabilized or resolved: Yes Denies suicidal/homicidal ideation: Yes Issues/concerns per patient self-inventory: None reported Other: N/A  New problem(s) identified: None reported at this time  New Short Term/Long Term Goal(s): None at this time  Discharge Plan or Barriers: Pt is deciding between returning home with outpatient services or seeking residential treatment. Expresses concern about willingness of foster care provider to continue to care for son while she seeks residential treatment, is exploring options for inpt vs outpt tx for alcohol abuse.    Reason for Continuation of Hospitalization: Anxiety Depression Medication stabilization Withdrawal symptoms  Estimated Length of Stay: 2-4 days    Attendees:  Patient:  Physician: Elvera Maria MD  Nursing: Vesta Mixer. Christa D.  12/08/2016 12:54 PM  RN Care Manager:  Social Workers: Santa Genera LCSW,   12/08/2016 12:54 PM  Nurse Pratictioners: Lorelee Market, NP 11/28/2016 9:30am Other:     Scribe for Treatment  Team: Santa Genera, LCSW Clinical Social Work [phone]

## 2016-12-08 NOTE — Plan of Care (Signed)
Problem: Activity: Goal: Interest or engagement in activities will improve Outcome: Progressing Patient is visible in the day room engaging in conversation with her peers.  She is participating in activities on the unit.

## 2016-12-08 NOTE — [facility] Group Notes (Signed)
[facility] LCSW Group Therapy  12/08/2016 1:28 PM  Type of Therapy:  Group Therapy  Participation Level:  Active  Participation Quality:  Appropriate  Affect:  Appropriate  Cognitive:  Appropriate  Insight:  Developing/Improving  Engagement in Therapy:  Engaged  Modes of Intervention:  Discussion, Exploration, Problem-solving and Rapport Building  Summary of Progress/Problems:  Patients discussed stresses and strengths related to discharge.  Identified concerns re discharge and processed feelings related to discharge.  Pt related her desire to stay on medications, learn more about dual diagnosis, and address issue of care for son so she can seek residential substance abuse treatment.    Sallee Langenne C Chyrl Elwell 12/08/2016, 1:28 PM

## 2016-12-08 NOTE — Progress Notes (Signed)
[facility] MD Progress Note  12/08/2016 8:18 PM Leslie Sanders  MRN:  [id number]  Subjective:  "I feel like that is the best sleep I have had in a long time with the minipress.  My BP was kinda low."  Objective: Pt seen and chart reviewed. Pt is alert/oriented x4, calm, cooperative, and appropriate to situation. Pt denies suicidal/homicidal ideation and psychosis and does not appear to be responding to internal stimuli. Pt reports having no nightmares and slept through the night the best since she has been here. Pt is responding very well to the minipress. Pt reports she had a low BP in the AM. However, speaking to the nursing staff and reviewing all her trending BP's, she trends low and did the last time she was here. There appears to be no direct correlation with the minipress and we will continue due to good results and minimal risk.    Principal Problem: Bipolar disorder, most recent episode depressed ([place]) Diagnosis:   Patient Active Problem List   Diagnosis Date Noted  . Major depression, recurrent ([place]) [F33.9] 11/27/2016  . Bipolar disorder, most recent episode depressed ([place]) [F31.30] 10/12/2016  . Anemia, iron deficiency [D50.9] 10/12/2016  . Alcohol use disorder, moderate, dependence ([place]) [F10.20] 10/12/2016  . Lactose intolerance [E73.9] 10/12/2016   Total Time spent with patient: 15 minutes  Past Medical History:  Past Medical History:  Diagnosis Date  . Depression    History reviewed. No pertinent surgical history. Family History:  Family History  Problem Relation Age of Onset  . Schizophrenia Maternal Aunt   . ADD / ADHD Son   . ODD Son    Social History:  History  Alcohol Use  . 16.8 oz/week  . 28 Cans of beer per week     History  Drug Use No    Social History   Social History  . Marital status: Divorced    Spouse name: N/A  . Number of children: N/A  . Years of education: N/A   Social History Main Topics  . Smoking status: Current Every Day Smoker     Packs/day: 0.50    Types: Cigarettes  . Smokeless tobacco: Never Used  . Alcohol use 16.8 oz/week    28 Cans of beer per week  . Drug use: No  . Sexual activity: Not Currently   Other Topics Concern  . None   Social History Narrative  . None   Additional Social History:    Pain Medications: Denies Prescriptions: Denies Over the Counter: Denies History of alcohol / drug use?: Yes Longest period of sobriety (when/how long): unknown  Negative Consequences of Use: Financial, Personal relationships Withdrawal Symptoms: Nausea / Vomiting, Irritability, Sweats, Tremors Name of Substance 1: Alcohol 1 - Age of First Use: 29 1 - Amount (size/oz): 6 beers 1 - Frequency: 3x/week 1 - Duration: ongoing 1 - Last Use / Amount: 11/25/16  Sleep: Poor Appetite:  Unchanged   Current Medications: Current Facility-Administered Medications  Medication Dose Route Frequency Provider Last Rate Last Dose  . alum & mag hydroxide-simeth (MAALOX/MYLANTA) 200-200-20 MG/5ML suspension 30 mL  30 mL Oral Q4H PRN Rozetta Nunnery, NP      . ARIPiprazole (ABILIFY) tablet 5 mg  5 mg Oral Daily Kathlee Nations, MD   5 mg at 12/08/16 0911  . ferrous sulfate tablet 325 mg  325 mg Oral BID WC Benjamine Mola, FNP   325 mg at 12/08/16 1633  . hydrocortisone cream 0.5 %  Topical BID Laverle Hobby, PA-C      . hydrOXYzine (ATARAX/VISTARIL) tablet 25 mg  25 mg Oral Q6H PRN Laverle Hobby, PA-C   25 mg at 12/05/16 2311  . ibuprofen (ADVIL,MOTRIN) tablet 800 mg  800 mg Oral Q8H PRN Kerrie Buffalo, NP   800 mg at 12/05/16 1610  . lamoTRIgine (LAMICTAL) tablet 25 mg  25 mg Oral BID Kerrie Buffalo, NP   25 mg at 12/08/16 1633  . magnesium hydroxide (MILK OF MAGNESIA) suspension 30 mL  30 mL Oral Daily PRN Rozetta Nunnery, NP   30 mL at 12/06/16 1911  . multivitamins with iron tablet 1 tablet  1 tablet Oral Daily Jenne Campus, MD   1 tablet at 12/08/16 9604  . nicotine polacrilex (NICORETTE) gum 2 mg  2 mg Oral PRN  Jenne Campus, MD      . prazosin (MINIPRESS) capsule 1 mg  1 mg Oral QHS Benjamine Mola, FNP   1 mg at 12/07/16 2333  . thiamine (VITAMIN B-1) tablet 100 mg  100 mg Oral Daily Jenne Campus, MD   100 mg at 12/08/16 0911  . traZODone (DESYREL) tablet 100 mg  100 mg Oral QHS PRN Kathlee Nations, MD   100 mg at 12/07/16 2333  . vitamin B-12 (CYANOCOBALAMIN) tablet 2,500 mcg  2,500 mcg Oral Daily Rozetta Nunnery, NP   2,500 mcg at 12/08/16 5409   Lab Results:  Results for orders placed or performed during the hospital encounter of 11/27/16 (from the past 48 hour(s))  Iron and TIBC     Status: Abnormal   Collection Time: 12/07/16  6:18 AM  Result Value Ref Range   Iron 21 (L) 28 - 170 ug/dL   TIBC 634 (H) 250 - 450 ug/dL   Saturation Ratios 3 (L) 10.4 - 31.8 %   UIBC 613 ug/dL    Comment: Performed at [facility]  Comprehensive metabolic panel     Status: Abnormal   Collection Time: 12/07/16  6:18 AM  Result Value Ref Range   Sodium 140 135 - 145 mmol/L   Potassium 4.2 3.5 - 5.1 mmol/L   Chloride 105 101 - 111 mmol/L   CO2 28 22 - 32 mmol/L   Glucose, Bld 80 65 - 99 mg/dL   BUN 6 6 - 20 mg/dL   Creatinine, Ser 0.56 0.44 - 1.00 mg/dL   Calcium 8.6 (L) 8.9 - 10.3 mg/dL   Total Protein 6.4 (L) 6.5 - 8.1 g/dL   Albumin 3.4 (L) 3.5 - 5.0 g/dL   AST 39 15 - 41 U/L   ALT 26 14 - 54 U/L   Alkaline Phosphatase 38 38 - 126 U/L   Total Bilirubin 0.4 0.3 - 1.2 mg/dL   GFR calc non Af Amer >60 >60 mL/min   GFR calc Af Amer >60 >60 mL/min    Comment: (NOTE) The eGFR has been calculated using the CKD EPI equation. This calculation has not been validated in all clinical situations. eGFR's persistently <60 mL/min signify possible Chronic Kidney Disease.    Anion gap 7 5 - 15    Comment: Performed at [facility]    Blood Alcohol level:  Lab Results  Component Value Date   [facility] <5 11/26/2016   ETH 65 (H) 81/19/1478    Metabolic Disorder Labs: Lab Results   Component Value Date   HGBA1C 5.4 11/29/2016   MPG 108 11/29/2016   Lab Results  Component Value Date   PROLACTIN 62.8 (H) 11/29/2016   Lab Results  Component Value Date   CHOL 173 11/29/2016   TRIG 84 11/29/2016   HDL 88 11/29/2016   CHOLHDL 2.0 11/29/2016   VLDL 17 11/29/2016   LDLCALC 68 11/29/2016   Physical Findings: AIMS: Facial and Oral Movements Muscles of Facial Expression: None, normal Lips and Perioral Area: None, normal Jaw: None, normal Tongue: None, normal,Extremity Movements Upper (arms, wrists, hands, fingers): None, normal Lower (legs, knees, ankles, toes): None, normal, Trunk Movements Neck, shoulders, hips: None, normal, Overall Severity Severity of abnormal movements (highest score from questions above): None, normal Incapacitation due to abnormal movements: None, normal Patient's awareness of abnormal movements (rate only patient's report): No Awareness, Dental Status Current problems with teeth and/or dentures?: No Does patient usually wear dentures?: No  CIWA:  CIWA-Ar Total: 0 COWS:  COWS Total Score: 1  Musculoskeletal: Strength & Muscle Tone: within normal limits Gait & Station: normal Patient leans: N/A  Psychiatric Specialty Exam: Physical Exam  Nursing note and vitals reviewed. Constitutional: She is oriented to person, place, and time. She appears well-developed.  Neurological: She is alert and oriented to person, place, and time.  Psychiatric: She has a normal mood and affect. Her behavior is normal.    Review of Systems  Constitutional: Positive for malaise/fatigue.  HENT: Negative.   Respiratory: Negative.   Cardiovascular: Negative.   Musculoskeletal: Negative.   Skin: Negative.   Psychiatric/Behavioral: Positive for depression and substance abuse. Negative for suicidal ideas. The patient is nervous/anxious and has insomnia.   All other systems reviewed and are negative.  denies headache, denies  chest pain, denies shortness  of breath, no vomiting  Blood pressure 109/76, pulse 83, temperature 98.1 F (36.7 C), temperature source Oral, resp. rate 18, height 5' 2" (1.575 m), weight 56.7 kg (125 lb), SpO2 100 %.Body mass index is 22.86 kg/m.  General Appearance: Casual and Fairly Groomed  Eye Contact:  Poor yet improving  Speech:  Normal Rate, clear  Volume:  Normal  Mood: depressed, anxious, yet improving.    Affect:  Restricted and constricted, more reactive today  Thought Process:  Linear, goal oriented  Orientation:  Full (Time, Place, and Person)  Thought Content:  denies hallucinations, no delusions, not internally preoccupied  Suicidal Thoughts:  No  Homicidal Thoughts:  No   Memory:  Immediate;   Fair Recent;   Fair Remote;   Fair  Judgement:  Fair   Insight:  Fair  Psychomotor Activity:  Normal  Concentration:  Concentration: Fair and Attention Span: Fair  Recall:  Good  Fund of Knowledge:  Good  Language:  Good  Akathisia:  Negative  Handed:  Right  AIMS (if indicated):     Assets:  Desire for Improvement Resilience  ADL's:  Intact  Cognition:  WNL  Sleep:  Number of Hours: 5.5    Treatment Plan Summary: Bipolar disorder, most recent episode depressed ([place]) unstable, managed as below:  Reviewed on 12/08/16, will continue regimen as below; pt is feeling better after starting her iron. Started Prazosin 29m po qhs last night for PTSD also (end of shift)   Medications:  Abilify 565mpo daily for mood stabilization Lamictal 5076mo daily for anxiety Trazodone 100m42m qhs prn insomnia Ativan 1mg 54mq6h prn ETOH symptoms -Continue minipress 1mg p53mhs for PTSD (is helping a lot per pt)  Daily contact with patient to assess and evaluate symptoms and progress in treatment, Medication management,  Plan inpatient admission  and medications as below   Benjamine Mola, FNP [facility] 12/08/2016, 8:18 PM

## 2016-12-08 NOTE — Progress Notes (Signed)
Pt has been sitting in the dayroom all evening watching TV and talking with peers.  Her interaction with peers was minimal.  She denies SI/HI/AVH.  She says that the rash on her face is resolving, and she has not itching or redness.  Her main focus is her iron level, and she asked when her level would be checked again.  She voiced no other needs.  She has been pleasant and cooperative.  She still presents as flat and somewhat sullen.  Support and encouragement offered.  Discharge plans are in process.  Will pass pt's concerns about her iron levels to the next shift.  Safety maintained with q15 minute checks.

## 2016-12-08 NOTE — Progress Notes (Signed)
D: Patient states she is sleeping well; her energy level is low; her concentration is good.  She rates her depression and anxiety as a 7; hopelessness as a 4.  Patient denies any thoughts of self harm.  Her goal today is to work on a "discharge plan."  She has been observed in the day room interacting with her peers.  She seems to have good insight regarding her treatment. A: Continue to monitor medication management and MD orders.  Safety checks completed every 15 minutes per protocol.  Offer support and encouragement as needed. R: Patient is receptive to staff; her behavior is appropriate.

## 2016-12-09 MED ORDER — ARIPIPRAZOLE 10 MG PO TABS
10.0000 mg | ORAL_TABLET | Freq: Every day | ORAL | Status: DC
  Administered 2016-12-10 – 2016-12-13 (×4): 10 mg via ORAL
  Filled 2016-12-09 (×6): qty 1

## 2016-12-09 MED ORDER — TRAZODONE HCL 100 MG PO TABS
100.0000 mg | ORAL_TABLET | Freq: Every evening | ORAL | Status: DC | PRN
  Administered 2016-12-09: 100 mg via ORAL
  Filled 2016-12-09: qty 1

## 2016-12-09 NOTE — Progress Notes (Signed)
Adult Psychoeducational Group Note  Date:  12/09/2016 Time:  5:37 PM  Group Topic/Focus:  Recovery Goals:   The focus of this group is to identify appropriate goals for recovery and establish a plan to achieve them.   Participation Level:  Active  Participation Quality:  Intrusive  Affect:  Anxious  Cognitive:  Appropriate  Insight: Appropriate  Engagement in Group:  Distracting  Modes of Intervention:  Discussion, Education and Orientation  Additional Comments:   Celisse Ciulla L 12/09/2016, 5:37 PM

## 2016-12-09 NOTE — [facility] Group Notes (Signed)
[facility] LCSW Group Therapy 12/09/2016 1:15 PM  Type of Therapy: Group Therapy- Feelings about Diagnosis  Participation Level: Monopolizing  Participation Quality:  Appropriate; Encouraging  Affect:  Appropriate  Cognitive: Alert and Oriented   Insight:  Developing   Engagement in Therapy: Developing/Improving and Engaged   Modes of Intervention: Clarification, Confrontation, Discussion, Education, Exploration, Limit-setting, Orientation, Problem-solving, Rapport Building, Dance movement psychotherapisteality Testing, Socialization and Support  Description of Group:   This group will allow patients to explore their thoughts and feelings about diagnoses they have received. Patients will be guided to explore their level of understanding and acceptance of these diagnoses. Facilitator will encourage patients to process their thoughts and feelings about the reactions of others to their diagnosis, and will guide patients in identifying ways to discuss their diagnosis with significant others in their lives. This group will be process-oriented, with patients participating in exploration of their own experiences as well as giving and receiving support and challenge from other group members.  Summary of Progress/Problems:  Pt continues to be active in group discussion and is encouraging to other peers. Pt describes feeling "down" the last few days and attributes this to grief related to her mother's death who died on January 2nd when Pt was much younger. Pt offered suggestions related to coping skills and boundary setting with family members.   Therapeutic Modalities:   Cognitive Behavioral Therapy Solution Focused Therapy Motivational Interviewing Relapse Prevention Therapy  Vernie ShanksLauren Jaris Kohles, LCSW 12/09/2016 3:28 PM

## 2016-12-09 NOTE — Progress Notes (Signed)
Patient ID: Leslie Sanders, female   DOB: 11/23/1976, 41 y.o.   MRN: [id number] PER STATE REGULATIONS 482.30  THIS CHART WAS REVIEWED FOR MEDICAL NECESSITY WITH RESPECT TO THE PATIENT'S ADMISSION/ DURATION OF STAY.  NEXT REVIEW DATE:12/14/2015 Willa RoughJENNIFER JONES Abrham Maslowski, RN, BSN CASE MANAGER

## 2016-12-09 NOTE — Progress Notes (Signed)
Nursing Progress Note: 7p-7a D: Pt currently presents with a irritable/depressed/anxious/suspicious affect and behavior. Pt states "i'm feeling really depressed today. I don't know why. I don't really think my Abilify is working. I want Seroquel. Why can't I have that?" Interacting apropriately with milieu. Pt reports ok sleep with current medication regimen.   A: Pt provided with medications per providers orders. Pt's labs and vitals were monitored throughout the night. Pt supported emotionally and encouraged to express concerns and questions. Pt educated on medications.  R: Pt's safety ensured with 15 minute and environmental checks. Pt currently denies SI/HI/Self Harm and A/V hallucinations. Pt verbally contracts to seek staff if SI/HI or A/VH occurs and to consult with staff before acting on any harmful thoughts. Will continue to monitor.

## 2016-12-09 NOTE — Progress Notes (Signed)
Recreation Therapy Notes  Animal-Assisted Activity (AAA) Program Checklist/Progress Notes Patient Eligibility Criteria Checklist & Daily Group note for Rec TxIntervention  Date: 01.02.2018 Time: 2:45pm Location: [address] Dayroom    AAA/T Program Assumption of Risk Form signed by Patient/ or Parent Legal Guardian Yes  Patient is free of allergies or sever asthma Yes  Patient reports no fear of animals Yes  Patient reports no history of cruelty to animals Yes  Patient understands his/her participation is voluntary Yes  Patient washes hands before animal contact Yes  Patient washes hands after animal contact Yes  Behavioral Response: Appropriate   Education:Hand Washing, Appropriate Animal Interaction   Education Outcome: Acknowledges education.   Clinical Observations/Feedback: Patient attended session and interacted appropriately with therapy dog and peers.    Leslie Sanders Leslie Sanders, LRT/CTRS        Leslie Sanders 12/09/2016 3:09 PM 

## 2016-12-09 NOTE — Progress Notes (Signed)
D: Pt presents anxious and animated on approach. Pt reported having increased depression for the last three days. Pt stated that the Abilify is not effective and seems to be making her depression worse. Pt reports poor sleep at bedtime.  Pt reports feeling withdrawn for the last three days. Pt intrusive and attention seeking throughout the shift. Pt denies suicidal thoughts and verbally contracts for safety.  A: Medications reviewed with pt. Medications administered as ordered per MD. Verbal support provided. 15 minute checks performed for safety.  R: Pt receptive to tx. Pt verbalized understanding of med regimen. Pt requesting med adjustment.

## 2016-12-09 NOTE — Progress Notes (Signed)
[facility] MD Progress Note  12/09/2016 4:00 PM Leslie Sanders  MRN:  [id number]  Subjective:  "I'm depressed today.  My mom's birthday.  I miss her."  Objective: Pt seen and chart reviewed. Pt is alert/oriented x4, calm, cooperative, and appropriate to situation. Pt denies suicidal/homicidal ideation and psychosis and does not appear to be responding to internal stimuli. Pt reports having no nightmares and slept through the night the best since she has been here. Pt is responding very well to the minipress.     Principal Problem: Bipolar disorder, most recent episode depressed ([facility]) Diagnosis:   Patient Active Problem List   Diagnosis Date Noted  . Major depression, recurrent ([facility]) [F33.9] 11/27/2016  . Bipolar disorder, most recent episode depressed ([facility]) [F31.30] 10/12/2016  . Anemia, iron deficiency [D50.9] 10/12/2016  . Alcohol use disorder, moderate, dependence ([facility]) [F10.20] 10/12/2016  . Lactose intolerance [E73.9] 10/12/2016   Total Time spent with patient: 15 minutes  Past Medical History:  Past Medical History:  Diagnosis Date  . Depression    History reviewed. No pertinent surgical history. Family History:  Family History  Problem Relation Age of Onset  . Schizophrenia Maternal Aunt   . ADD / ADHD Son   . ODD Son    Social History:  History  Alcohol Use  . 16.8 oz/week  . 28 Cans of beer per week     History  Drug Use No    Social History   Social History  . Marital status: Divorced    Spouse name: N/A  . Number of children: N/A  . Years of education: N/A   Social History Main Topics  . Smoking status: Current Every Day Smoker    Packs/day: 0.50    Types: Cigarettes  . Smokeless tobacco: Never Used  . Alcohol use 16.8 oz/week    28 Cans of beer per week  . Drug use: No  . Sexual activity: Not Currently   Other Topics Concern  . None   Social History Narrative  . None   Additional Social History:    Pain Medications: Denies Prescriptions:  Denies Over the Counter: Denies History of alcohol / drug use?: Yes Longest period of sobriety (when/how long): unknown  Negative Consequences of Use: Financial, Personal relationships Withdrawal Symptoms: Nausea / Vomiting, Irritability, Sweats, Tremors Name of Substance 1: Alcohol 1 - Age of First Use: 29 1 - Amount (size/oz): 6 beers 1 - Frequency: 3x/week 1 - Duration: ongoing 1 - Last Use / Amount: 11/25/16  Sleep: Poor Appetite:  Unchanged   Current Medications: Current Facility-Administered Medications  Medication Dose Route Frequency Provider Last Rate Last Dose  . alum & mag hydroxide-simeth (MAALOX/MYLANTA) 200-200-20 MG/5ML suspension 30 mL  30 mL Oral Q4H PRN Jackelyn Poling, NP      . Melene Muller ON 12/10/2016] ARIPiprazole (ABILIFY) tablet 10 mg  10 mg Oral Daily Adonis Brook, NP      . ferrous sulfate tablet 325 mg  325 mg Oral BID WC Beau Fanny, FNP   325 mg at 12/09/16 4098  . hydrocortisone cream 0.5 %   Topical BID Kerry Hough, PA-C      . hydrOXYzine (ATARAX/VISTARIL) tablet 25 mg  25 mg Oral Q6H PRN Kerry Hough, PA-C   25 mg at 12/09/16 1191  . ibuprofen (ADVIL,MOTRIN) tablet 800 mg  800 mg Oral Q8H PRN Adonis Brook, NP   800 mg at 12/05/16 4782  . lamoTRIgine (LAMICTAL) tablet 25 mg  25 mg  Oral BID Adonis Brook, NP   25 mg at 12/09/16 0830  . magnesium hydroxide (MILK OF MAGNESIA) suspension 30 mL  30 mL Oral Daily PRN Jackelyn Poling, NP   30 mL at 12/06/16 1911  . multivitamins with iron tablet 1 tablet  1 tablet Oral Daily Craige Cotta, MD   1 tablet at 12/09/16 0829  . nicotine polacrilex (NICORETTE) gum 2 mg  2 mg Oral PRN Rockey Situ Cobos, MD      . prazosin (MINIPRESS) capsule 1 mg  1 mg Oral QHS Beau Fanny, FNP   1 mg at 12/08/16 2257  . thiamine (VITAMIN B-1) tablet 100 mg  100 mg Oral Daily Craige Cotta, MD   100 mg at 12/09/16 0829  . traZODone (DESYREL) tablet 100 mg  100 mg Oral QHS PRN Adonis Brook, NP      . vitamin B-12  (CYANOCOBALAMIN) tablet 2,500 mcg  2,500 mcg Oral Daily Jackelyn Poling, NP   2,500 mcg at 12/09/16 1610   Lab Results:  No results found for this or any previous visit (from the past 48 hour(s)).  Blood Alcohol level:  Lab Results  Component Value Date   ETH <5 11/26/2016   ETH 65 (H) 10/11/2016    Metabolic Disorder Labs: Lab Results  Component Value Date   HGBA1C 5.4 11/29/2016   MPG 108 11/29/2016   Lab Results  Component Value Date   PROLACTIN 62.8 (H) 11/29/2016   Lab Results  Component Value Date   CHOL 173 11/29/2016   TRIG 84 11/29/2016   HDL 88 11/29/2016   CHOLHDL 2.0 11/29/2016   VLDL 17 11/29/2016   LDLCALC 68 11/29/2016   Physical Findings: AIMS: Facial and Oral Movements Muscles of Facial Expression: None, normal Lips and Perioral Area: None, normal Jaw: None, normal Tongue: None, normal,Extremity Movements Upper (arms, wrists, hands, fingers): None, normal Lower (legs, knees, ankles, toes): None, normal, Trunk Movements Neck, shoulders, hips: None, normal, Overall Severity Severity of abnormal movements (highest score from questions above): None, normal Incapacitation due to abnormal movements: None, normal Patient's awareness of abnormal movements (rate only patient's report): No Awareness, Dental Status Current problems with teeth and/or dentures?: No Does patient usually wear dentures?: No  CIWA:  CIWA-Ar Total: 0 COWS:  COWS Total Score: 1  Musculoskeletal: Strength & Muscle Tone: within normal limits Gait & Station: normal Patient leans: N/A  Psychiatric Specialty Exam: Physical Exam  Nursing note and vitals reviewed. Constitutional: She is oriented to person, place, and time. She appears well-developed.  Neurological: She is alert and oriented to person, place, and time.  Psychiatric: She has a normal mood and affect. Her behavior is normal.    Review of Systems  Constitutional: Positive for malaise/fatigue.  HENT: Negative.    Respiratory: Negative.   Cardiovascular: Negative.   Musculoskeletal: Negative.   Skin: Negative.   Psychiatric/Behavioral: Positive for depression and substance abuse. Negative for suicidal ideas. The patient is nervous/anxious and has insomnia.   All other systems reviewed and are negative.  denies headache, denies  chest pain, denies shortness of breath, no vomiting  Blood pressure 126/67, pulse (!) 105, temperature 98.6 F (37 C), temperature source Oral, resp. rate 16, height 5\' 2"  (1.575 m), weight 56.7 kg (125 lb), SpO2 100 %.Body mass index is 22.86 kg/m.  General Appearance: Casual and Fairly Groomed  Eye Contact:  Poor yet improving  Speech:  Normal Rate, clear  Volume:  Normal  Mood: depressed, anxious,  yet improving.    Affect:  Restricted and constricted, more reactive today  Thought Process:  Linear, goal oriented  Orientation:  Full (Time, Place, and Person)  Thought Content:  denies hallucinations, no delusions, not internally preoccupied  Suicidal Thoughts:  No  Homicidal Thoughts:  No   Memory:  Immediate;   Fair Recent;   Fair Remote;   Fair  Judgement:  Fair   Insight:  Fair  Psychomotor Activity:  Normal  Concentration:  Concentration: Fair and Attention Span: Fair  Recall:  Good  Fund of Knowledge:  Good  Language:  Good  Akathisia:  Negative  Handed:  Right  AIMS (if indicated):     Assets:  Desire for Improvement Resilience  ADL's:  Intact  Cognition:  WNL  Sleep:  Number of Hours: 6.25    Treatment Plan Summary: Bipolar disorder, most recent episode depressed ([facility]) unstable, managed as below:  Medications:  Increased Abilify 10 mg po daily for mood stabilization Lamictal 50mg  po daily for anxiety Increased  Trazodone to  150mg  po qhs prn insomnia Ativan 1mg  po q6h prn ETOH symptoms -Continue minipress 1mg  po qhs for PTSD (is helping a lot per pt)  Daily contact with patient to assess and evaluate symptoms and progress in treatment,  Medication management, Plan inpatient admission  and medications as below   [facility] Memorial Hospitalheila May Iasiah Ozment, NP [facility] 12/09/2016, 4:00 PM

## 2016-12-10 MED ORDER — LAMOTRIGINE 100 MG PO TABS
50.0000 mg | ORAL_TABLET | Freq: Every day | ORAL | Status: DC
  Administered 2016-12-10 – 2016-12-14 (×5): 50 mg via ORAL
  Filled 2016-12-10 (×6): qty 2

## 2016-12-10 MED ORDER — MIRTAZAPINE 7.5 MG PO TABS
7.5000 mg | ORAL_TABLET | Freq: Every day | ORAL | Status: DC
  Administered 2016-12-10 – 2016-12-14 (×5): 7.5 mg via ORAL
  Filled 2016-12-10: qty 1
  Filled 2016-12-10: qty 21
  Filled 2016-12-10 (×5): qty 1

## 2016-12-10 MED ORDER — DOCUSATE SODIUM 100 MG PO CAPS
100.0000 mg | ORAL_CAPSULE | Freq: Two times a day (BID) | ORAL | Status: DC | PRN
  Administered 2016-12-10 – 2016-12-15 (×5): 100 mg via ORAL
  Filled 2016-12-10 (×3): qty 1
  Filled 2016-12-10 (×2): qty 30
  Filled 2016-12-10: qty 1

## 2016-12-10 MED ORDER — LAMOTRIGINE 25 MG PO TABS
25.0000 mg | ORAL_TABLET | Freq: Every day | ORAL | Status: DC
  Administered 2016-12-11 – 2016-12-15 (×5): 25 mg via ORAL
  Filled 2016-12-10 (×6): qty 1

## 2016-12-10 NOTE — Progress Notes (Signed)
CSW attempted to make [facility] referral on 12/29 and the admission staff reports that it was never received. CSW refaxed on 1/2 and rechecked this morning and again it was said that it wasn't received. CSW refaxed referral today. CSW will continue to follow-up on referral.   Vernie ShanksLauren Earnie Rockhold, LCSW Clinical Social Work [phone]

## 2016-12-10 NOTE — Progress Notes (Addendum)
[facility] MD Progress Note  12/10/2016 11:04 AM MARIEME MCMACKIN  MRN:  [id number]  Subjective:  Patient states she continues to feel depressed, sad, and states " I think the last day or two I have been feeling worse ". She attributes this at least in part to it being the anniversary of her mother's  Rudene Anda ( who is now deceased) , and states " this is always a bad time of year for me". Denies medication side effects, but states " I am not sure they are working that much ". Complains of constipation related to Fe++ management ( for anemia) . Did have BM yesterday.  Describes intermittent passive thoughts of death, but denies suicidal ideations , contracts for safety on unit.  Objective:  I have discussed case with treatment team , and have met with patient. Patient reports ongoing depression, vague anxiety, but overall presents better than on admission, and is noted to have a fuller range of affect. Staff reports that patient has appeared  Brighter and interactive in milieu. No disruptive or agitated behaviors on unit. As above, tolerating medications well, but does not think they have " really kicked in yet". No akathisia. Of note, reports insomnia in spite of increasing Trazodone doses .    Principal Problem: Bipolar disorder, most recent episode depressed ([place]) Diagnosis:   Patient Active Problem List   Diagnosis Date Noted  . Major depression, recurrent ([place]) [F33.9] 11/27/2016  . Bipolar disorder, most recent episode depressed ([place]) [F31.30] 10/12/2016  . Anemia, iron deficiency [D50.9] 10/12/2016  . Alcohol use disorder, moderate, dependence ([place]) [F10.20] 10/12/2016  . Lactose intolerance [E73.9] 10/12/2016   Total Time spent with patient: 20  minutes  Past Medical History:  Past Medical History:  Diagnosis Date  . Depression    History reviewed. No pertinent surgical history. Family History:  Family History  Problem Relation Age of Onset  . Schizophrenia Maternal Aunt   . ADD /  ADHD Son   . ODD Son    Social History:  History  Alcohol Use  . 16.8 oz/week  . 28 Cans of beer per week     History  Drug Use No    Social History   Social History  . Marital status: Divorced    Spouse name: N/A  . Number of children: N/A  . Years of education: N/A   Social History Main Topics  . Smoking status: Current Every Day Smoker    Packs/day: 0.50    Types: Cigarettes  . Smokeless tobacco: Never Used  . Alcohol use 16.8 oz/week    28 Cans of beer per week  . Drug use: No  . Sexual activity: Not Currently   Other Topics Concern  . None   Social History Narrative  . None   Additional Social History:    Pain Medications: Denies Prescriptions: Denies Over the Counter: Denies History of alcohol / drug use?: Yes Longest period of sobriety (when/how long): unknown  Negative Consequences of Use: Financial, Personal relationships Withdrawal Symptoms: Nausea / Vomiting, Irritability, Sweats, Tremors Name of Substance 1: Alcohol 1 - Age of First Use: 29 1 - Amount (size/oz): 6 beers 1 - Frequency: 3x/week 1 - Duration: ongoing 1 - Last Use / Amount: 11/25/16  Sleep: Fair  Appetite:  Unchanged   Current Medications: Current Facility-Administered Medications  Medication Dose Route Frequency Provider Last Rate Last Dose  . alum & mag hydroxide-simeth (MAALOX/MYLANTA) 200-200-20 MG/5ML suspension 30 mL  30 mL Oral Q4H PRN Corene Cornea  A Berry, NP      . ARIPiprazole (ABILIFY) tablet 10 mg  10 mg Oral Daily Kerrie Buffalo, NP   10 mg at 12/10/16 2025  . ferrous sulfate tablet 325 mg  325 mg Oral BID WC Benjamine Mola, FNP   325 mg at 12/10/16 4270  . hydrocortisone cream 0.5 %   Topical BID Laverle Hobby, PA-C      . hydrOXYzine (ATARAX/VISTARIL) tablet 25 mg  25 mg Oral Q6H PRN Laverle Hobby, PA-C   25 mg at 12/09/16 2315  . ibuprofen (ADVIL,MOTRIN) tablet 800 mg  800 mg Oral Q8H PRN Kerrie Buffalo, NP   800 mg at 12/05/16 6237  . [START ON 12/11/2016] lamoTRIgine  (LAMICTAL) tablet 25 mg  25 mg Oral q morning - 10a Myer Peer Cobos, MD      . lamoTRIgine (LAMICTAL) tablet 50 mg  50 mg Oral QHS Fernando A Cobos, MD      . magnesium hydroxide (MILK OF MAGNESIA) suspension 30 mL  30 mL Oral Daily PRN Rozetta Nunnery, NP   30 mL at 12/09/16 1700  . mirtazapine (REMERON) tablet 7.5 mg  7.5 mg Oral QHS Myer Peer Cobos, MD      . multivitamins with iron tablet 1 tablet  1 tablet Oral Daily Jenne Campus, MD   1 tablet at 12/10/16 0809  . nicotine polacrilex (NICORETTE) gum 2 mg  2 mg Oral PRN Myer Peer Cobos, MD      . prazosin (MINIPRESS) capsule 1 mg  1 mg Oral QHS Benjamine Mola, FNP   1 mg at 12/09/16 2314  . thiamine (VITAMIN B-1) tablet 100 mg  100 mg Oral Daily Jenne Campus, MD   100 mg at 12/10/16 0808  . vitamin B-12 (CYANOCOBALAMIN) tablet 2,500 mcg  2,500 mcg Oral Daily Rozetta Nunnery, NP   2,500 mcg at 12/10/16 6283   Lab Results:  No results found for this or any previous visit (from the past 22 hour(s)).  Blood Alcohol level:  Lab Results  Component Value Date   ETH <5 11/26/2016   ETH 65 (H) 15/17/6160    Metabolic Disorder Labs: Lab Results  Component Value Date   HGBA1C 5.4 11/29/2016   MPG 108 11/29/2016   Lab Results  Component Value Date   PROLACTIN 62.8 (H) 11/29/2016   Lab Results  Component Value Date   CHOL 173 11/29/2016   TRIG 84 11/29/2016   HDL 88 11/29/2016   CHOLHDL 2.0 11/29/2016   VLDL 17 11/29/2016   LDLCALC 68 11/29/2016   Physical Findings: AIMS: Facial and Oral Movements Muscles of Facial Expression: None, normal Lips and Perioral Area: None, normal Jaw: None, normal Tongue: None, normal,Extremity Movements Upper (arms, wrists, hands, fingers): None, normal Lower (legs, knees, ankles, toes): None, normal, Trunk Movements Neck, shoulders, hips: None, normal, Overall Severity Severity of abnormal movements (highest score from questions above): None, normal Incapacitation due to abnormal  movements: None, normal Patient's awareness of abnormal movements (rate only patient's report): No Awareness, Dental Status Current problems with teeth and/or dentures?: No Does patient usually wear dentures?: No  CIWA:  CIWA-Ar Total: 0 COWS:  COWS Total Score: 1  Musculoskeletal: Strength & Muscle Tone: within normal limits Gait & Station: normal Patient leans: N/A  Psychiatric Specialty Exam: Physical Exam  Nursing note and vitals reviewed. Constitutional: She is oriented to person, place, and time. She appears well-developed.  Neurological: She is alert and oriented to person,  place, and time.  Psychiatric: She has a normal mood and affect. Her behavior is normal.    Review of Systems  Constitutional: Positive for malaise/fatigue.  HENT: Negative.   Respiratory: Negative.   Cardiovascular: Negative.   Musculoskeletal: Negative.   Skin: Negative.   Psychiatric/Behavioral: Positive for depression and substance abuse. Negative for suicidal ideas. The patient is nervous/anxious and has insomnia.   All other systems reviewed and are negative.  denies headache, denies  chest pain, denies shortness of breath, no vomiting  Blood pressure 116/68, pulse 100, temperature 97.5 F (36.4 C), temperature source Oral, resp. rate 18, height _0  (1.575 m), weight 56.7 kg (125 lb), SpO2 100 %.Body mass index is 22.86 kg/m.  General Appearance: improved grooming   Eye Contact:  Good   Speech:  Normal Rate  Volume:  Normal  Mood: reports ongoing depression  Affect:  Vaguely constricted but reactive   Thought Process:  Linear, goal oriented  Orientation:  Full (Time, Place, and Person)  Thought Content:  denies hallucinations, no delusions, not internally preoccupied  Suicidal Thoughts:  Reports some passive thoughts of death, dying , but denies plan or intention of hurting self or of suicide  Homicidal Thoughts:  No no homicidal or violent ideations reported /endorsed   Memory: recent and  remote grossly intact   Judgement: improving   Insight:  Fair  Psychomotor Activity:  Normal  Concentration:  Concentration: Good and Attention Span: Good  Recall:  Good  Fund of Knowledge:  Good  Language:  Good  Akathisia:  Negative  Handed:  Right  AIMS (if indicated):     Assets:  Desire for Improvement Resilience  ADL's:  Intact  Cognition:  WNL  Sleep:  Number of Hours: 5.75    Assessment - patient continues to present with some depression, anxiety, ruminations and intermittent passive SI. Denies any active SI or self injurious ideations . She is tolerating medications well thus far. Abilify has been increased gradually- no akathisia noted or reported . Complains of some ongoing insomnia in spite of Trazodone management- in the past had been on Remeron, which she remembers as more effective and well tolerated. May help insomnia and also mood/depression.  Treatment Plan Summary: Bipolar disorder, most recent episode depressed ([place]) unstable, managed as below:  Medications:  Continue Abilify 10 mg QDAY for mood stabilization Increase Lamictal to 25 mgrs QAM and 50 mgrs QHS  For depression and mood disorder  D/C Trazodone  Start Remeron 7.5 mgrs QHS for depression, insomnia Continue Minipress 68m QHS for PTSD related nightmares  Treatment team working on disposition planning options  Daily contact with patient to assess and evaluate symptoms and progress in treatment, Medication management, Plan inpatient admission  and medications as below   CNeita Garnet MD  12/10/2016, 11:04 AM   Patient ID: PJearld Lesch female   DOB: [birth year] 41y.o.   MRN: [id number]

## 2016-12-10 NOTE — Progress Notes (Signed)
Patient ID: Leslie Sanders, female   DOB: 1976/01/01, 41 y.o.   MRN: [id number]  Pt currently presents with a blunted affect and cooperative behavior. Pt reports to writer that their goal is to "go to sleep." Pt states "today was a really hard day for me, a lot of anxiety, but I got through it." Pt reports good sleep with current medication regimen. Pt concerned about changing her medicines, report she would like for them to be increased as she "does not feel anything."  Pt provided with medications per providers orders. Pt's labs and vitals were monitored throughout the night. Pt supported emotionally and encouraged to express concerns and questions. Pt educated on medications.  Pt's safety ensured with 15 minute and environmental checks. Pt currently denies SI/HI and A/V hallucinations. Pt verbally agrees to seek staff if SI/HI or A/VH occurs and to consult with staff before acting on any harmful thoughts. Pt affect brighter, eye contact with Clinical research associatewriter and interaction with peers improving. Will continue POC.

## 2016-12-10 NOTE — Progress Notes (Signed)
Recreation Therapy Notes  Date: 12/10/16 Time: 0930 Location: [address] Dayroom  Group Topic: Stress Management  Goal Area(s) Addresses:  Patient will verbalize importance of using healthy stress management.  Patient will identify positive emotions associated with healthy stress management.   Intervention: Stress Management  Activity :  Letting Go Meditation.  LRT introduced to stress management technique of meditation.  LRT played a meditation focused on letting go of the past and focusing on the now from the Calm app.  Patients were to follow along as the meditation played to fully engage in the technique.  Education:  Stress Management, Discharge Planning.   Education Outcome: Acknowledges edcuation/In group clarification offered/Needs additional education  Clinical Observations/Feedback: Pt did not attend group.    Kamla Skilton, LRT/CTRS         Kendallyn Lippold A 12/10/2016 11:26 AM 

## 2016-12-10 NOTE — Progress Notes (Signed)
Adult Psychoeducational Group Note  Date:  12/10/2016 Time:  9:38 PM  Group Topic/Focus:  Wrap-Up Group:   The focus of this group is to help patients review their daily goal of treatment and discuss progress on daily workbooks.   Participation Level:  Active  Participation Quality:  Appropriate  Affect:  Appropriate  Cognitive:  Appropriate  Insight: Appropriate  Engagement in Group:  Engaged  Modes of Intervention:  Discussion  Additional Comments:  Patient attended wrap-up group and said that her day was a 7.  Something positive about her day was she had great conversation with the new patients that come in today. Ryott Rafferty W Caleel Kiner 12/10/2016, 9:38 PM

## 2016-12-10 NOTE — [facility] Group Notes (Signed)
[facility] LCSW Group Therapy 12/10/2016 1:15 PM  Type of Therapy: Group Therapy- Emotion Regulation  Pt was in the group room but slept throughout group session and was not engaged.   Vernie ShanksLauren Jillayne Witte, LCSW 12/10/2016 3:50 PM

## 2016-12-10 NOTE — Progress Notes (Signed)
D: Pt presents anxious on approach. Pt reports feeling increasingly depressed today with high anxiety. Pt reports that her meds are not effective in treating her depression. Pt stated that she's been feeling worse, having crying spells,  episodes of feeling sad and withdrawn. Writer reviewed medications with pt and explained that Abilify was increased to 10 mg starting this morning. Pt continued to be resistant to taking Abilify because she doesn't believe that it's effective. However, pt remains compliant with taking meds this morning. No side effects to meds verbalized by pt. Pt compliant with attending groups. Pt intrusive throughout the shift and often interferes with other pts on the unit tx. Pt advise to focus on her own tx and to allow other pts to verbalize their own needs. A: Medications reviewed with pt. Medications administered as ordered per MD. Shift assessment completed. Verbal support provided. Order obtained for stool softener from Dr. Jama Flavorsobos. 15 minute checks performed for safety. R: Pt receptive to tx. Pt verbalized understanding of med regimen. Pt c/o constipation due to taking iron supplements.

## 2016-12-11 NOTE — [facility] Group Notes (Signed)
[facility] 12/11/2016 1:15pm  Type of Therapy: Mental [employer] Presentation  Participation Level: Active  Participation Quality: Attentive  Affect: Appropriate  Cognitive: Oriented  Insight: Developing/Improving  Engagement in Therapy: Engaged  Modes of Intervention: Discussion, Education and Socialization  Summary of Progress/Problems: Mental [employer] Association (MHA) Speaker came to talk about his personal journey with substance abuse and addiction. The pt processed ways by which to relate to the speaker. MHA speaker provided handouts and educational information pertaining to groups and services offered by the [facility]. Pt was engaged in speaker's presentation and was receptive to resources provided.    Vernie ShanksLauren Berlene Dixson, LCSW 12/11/2016 1:32 PM

## 2016-12-11 NOTE — Progress Notes (Addendum)
[facility] MD Progress Note  12/11/2016 1:48 PM ROCKY RISHEL  MRN:  [id number]  Subjective:  Reports some improvement but endorses ongoing depression, anxiety, and expresses apprehension about discharging, particularly states she feels " going back to living alone would not be good for me ". She is future oriented, and has been thinking of moving in with a friend. Has also discussed other options with her CSW, such as [facility] [place]. Denies medication side effects.   Objective:  I have discussed case with treatment team , and have met with patient. Partially improved, but remains depressed, sad , and becomes tearful during session, when discussing her psychosocial stressors ( having a son with Autism Spectrum Disorder, who is now in [facility], living alone) . Denies current suicidal ideations. Reports some dizziness at times, but denies other medication side effects and at this time gait is steady . Visible in milieu- affect is variable, constricted at times, but brightens during interactions with peers . Patient has history of Vitamin B12 and D deficiencies, as well as history of iron deficiency anemia- currently on MVI/iron supplementation. Responds partially to support, empathy, and affect improves partially during session. No disruptive or agitated behaviors on unit. Describes intermittent cravings for alcohol- has expressed interest in going to an inpatient rehab setting such as [facility] on discharge.    Principal Problem: Bipolar disorder, most recent episode depressed ([place]) Diagnosis:   Patient Active Problem List   Diagnosis Date Noted  . Major depression, recurrent ([place]) [F33.9] 11/27/2016  . Bipolar disorder, most recent episode depressed ([place]) [F31.30] 10/12/2016  . Anemia, iron deficiency [D50.9] 10/12/2016  . Alcohol use disorder, moderate, dependence ([place]) [F10.20] 10/12/2016  . Lactose intolerance [E73.9] 10/12/2016   Total Time spent with patient: 20  minutes  Past Medical  History:  Past Medical History:  Diagnosis Date  . Depression    History reviewed. No pertinent surgical history. Family History:  Family History  Problem Relation Age of Onset  . Schizophrenia Maternal Aunt   . ADD / ADHD Son   . ODD Son    Social History:  History  Alcohol Use  . 16.8 oz/week  . 28 Cans of beer per week     History  Drug Use No    Social History   Social History  . Marital status: Divorced    Spouse name: N/A  . Number of children: N/A  . Years of education: N/A   Social History Main Topics  . Smoking status: Current Every Day Smoker    Packs/day: 0.50    Types: Cigarettes  . Smokeless tobacco: Never Used  . Alcohol use 16.8 oz/week    28 Cans of beer per week  . Drug use: No  . Sexual activity: Not Currently   Other Topics Concern  . None   Social History Narrative  . None   Additional Social History:    Pain Medications: Denies Prescriptions: Denies Over the Counter: Denies History of alcohol / drug use?: Yes Longest period of sobriety (when/how long): unknown  Negative Consequences of Use: Financial, Personal relationships Withdrawal Symptoms: Nausea / Vomiting, Irritability, Sweats, Tremors Name of Substance 1: Alcohol 1 - Age of First Use: 29 1 - Amount (size/oz): 6 beers 1 - Frequency: 3x/week 1 - Duration: ongoing 1 - Last Use / Amount: 11/25/16  Sleep: improving  Appetite:improving   Current Medications: Current Facility-Administered Medications  Medication Dose Route Frequency Provider Last Rate Last Dose  . alum & mag hydroxide-simeth (  MAALOX/MYLANTA) 200-200-20 MG/5ML suspension 30 mL  30 mL Oral Q4H PRN Rozetta Nunnery, NP      . ARIPiprazole (ABILIFY) tablet 10 mg  10 mg Oral Daily Kerrie Buffalo, NP   10 mg at 12/11/16 0807  . docusate sodium (COLACE) capsule 100 mg  100 mg Oral BID PRN Jenne Campus, MD   100 mg at 12/11/16 0811  . ferrous sulfate tablet 325 mg  325 mg Oral BID WC Benjamine Mola, FNP   325 mg at  12/11/16 1610  . hydrocortisone cream 0.5 %   Topical BID Laverle Hobby, PA-C      . hydrOXYzine (ATARAX/VISTARIL) tablet 25 mg  25 mg Oral Q6H PRN Laverle Hobby, PA-C   25 mg at 12/09/16 2315  . ibuprofen (ADVIL,MOTRIN) tablet 800 mg  800 mg Oral Q8H PRN Kerrie Buffalo, NP   800 mg at 12/05/16 9604  . lamoTRIgine (LAMICTAL) tablet 25 mg  25 mg Oral Daily Jenne Campus, MD   25 mg at 12/11/16 0807  . lamoTRIgine (LAMICTAL) tablet 50 mg  50 mg Oral QHS Myer Peer Cobos, MD   50 mg at 12/10/16 2300  . magnesium hydroxide (MILK OF MAGNESIA) suspension 30 mL  30 mL Oral Daily PRN Rozetta Nunnery, NP   30 mL at 12/09/16 1700  . mirtazapine (REMERON) tablet 7.5 mg  7.5 mg Oral QHS Myer Peer Cobos, MD   7.5 mg at 12/10/16 2300  . multivitamins with iron tablet 1 tablet  1 tablet Oral Daily Jenne Campus, MD   1 tablet at 12/11/16 0829  . nicotine polacrilex (NICORETTE) gum 2 mg  2 mg Oral PRN Myer Peer Cobos, MD      . prazosin (MINIPRESS) capsule 1 mg  1 mg Oral QHS Benjamine Mola, FNP   1 mg at 12/10/16 2300  . thiamine (VITAMIN B-1) tablet 100 mg  100 mg Oral Daily Jenne Campus, MD   100 mg at 12/11/16 0807  . vitamin B-12 (CYANOCOBALAMIN) tablet 2,500 mcg  2,500 mcg Oral Daily Rozetta Nunnery, NP   2,500 mcg at 12/11/16 5409   Lab Results:  No results found for this or any previous visit (from the past 78 hour(s)).  Blood Alcohol level:  Lab Results  Component Value Date   ETH <5 11/26/2016   ETH 65 (H) 81/19/1478    Metabolic Disorder Labs: Lab Results  Component Value Date   HGBA1C 5.4 11/29/2016   MPG 108 11/29/2016   Lab Results  Component Value Date   PROLACTIN 62.8 (H) 11/29/2016   Lab Results  Component Value Date   CHOL 173 11/29/2016   TRIG 84 11/29/2016   HDL 88 11/29/2016   CHOLHDL 2.0 11/29/2016   VLDL 17 11/29/2016   LDLCALC 68 11/29/2016   Physical Findings: AIMS: Facial and Oral Movements Muscles of Facial Expression: None, normal Lips and  Perioral Area: None, normal Jaw: None, normal Tongue: None, normal,Extremity Movements Upper (arms, wrists, hands, fingers): None, normal Lower (legs, knees, ankles, toes): None, normal, Trunk Movements Neck, shoulders, hips: None, normal, Overall Severity Severity of abnormal movements (highest score from questions above): None, normal Incapacitation due to abnormal movements: None, normal Patient's awareness of abnormal movements (rate only patient's report): No Awareness, Dental Status Current problems with teeth and/or dentures?: No Does patient usually wear dentures?: No  CIWA:  CIWA-Ar Total: 0 COWS:  COWS Total Score: 1  Musculoskeletal: Strength & Muscle Tone: within normal  limits Gait & Station: normal Patient leans: N/A  Psychiatric Specialty Exam: Physical Exam  Nursing note and vitals reviewed. Constitutional: She is oriented to person, place, and time. She appears well-developed.  Neurological: She is alert and oriented to person, place, and time.  Psychiatric: She has a normal mood and affect. Her behavior is normal.    Review of Systems  Constitutional: Positive for malaise/fatigue.  HENT: Negative.   Respiratory: Negative.   Cardiovascular: Negative.   Musculoskeletal: Negative.   Skin: Negative.   Psychiatric/Behavioral: Positive for depression and substance abuse. Negative for suicidal ideas. The patient is nervous/anxious and has insomnia.   All other systems reviewed and are negative.  denies headache, denies  chest pain, denies shortness of breath, no vomiting  Blood pressure 100/66, pulse (!) 113, temperature 98.4 F (36.9 C), temperature source Oral, resp. rate 18, height '5\' 2"'  (1.575 m), weight 56.7 kg (125 lb), SpO2 100 %.Body mass index is 22.86 kg/m.  General Appearance: improved grooming   Eye Contact:  Good   Speech:  Normal Rate  Volume:  Normal  Mood: remains depressed, partial improvement since admission  Affect:  Remains  constricted but  reactive in certain situations   Thought Process:  Linear, goal oriented  Orientation:  Full (Time, Place, and Person)  Thought Content:  denies hallucinations, no delusions, not internally preoccupied  Suicidal Thoughts:  Today denies suicidal or self injurious ideations   Homicidal Thoughts:  No no homicidal or violent ideations reported /endorsed   Memory: recent and remote grossly intact   Judgement: improving   Insight: improving   Psychomotor Activity:  Normal  Concentration:  Concentration: Good and Attention Span: Good  Recall:  Good  Fund of Knowledge:  Good  Language:  Good  Akathisia:  Negative  Handed:  Right  AIMS (if indicated):     Assets:  Desire for Improvement Resilience  ADL's:  Intact  Cognition:  WNL  Sleep:  Number of Hours: 5.5    Assessment -  Patient has improved partially but remains depressed, and states that she feels more depressed due to anniversary of mother's birthday, and that this time of year is usually difficult for her. Also ruminates about her son, currently in foster care. She does present with a more reactive affect and has been noted to be significantly brighter during interactions with peers. Expresses ongoing apprehension about discharge, becomes anxious when addressing discharge planning. Tolerating medications well, and does state she slept better on Remeron .  Treatment Plan Summary: Bipolar disorder, most recent episode depressed ([place]) unstable, managed as below:  Medications:  Continue Abilify 10 mg QDAY for mood stabilization Continue Lamictal  25 mgrs QAM and 50 mgrs QHS  For depression and mood disorder  Continue Remeron 7.5 mgrs QHS for depression, insomnia Continue Minipress 50m QHS for PTSD related nightmares  Treatment team working on disposition planning options- [facility] Daily contact with patient to assess and evaluate symptoms and progress in treatment, Medication management, Plan inpatient  admission  and medications as below   CNeita Garnet MD  12/11/2016, 1:48 PM   Patient ID: PJearld Lesch female   DOB: 601-16-1977 41y.o.   MRN: [id number]

## 2016-12-11 NOTE — Progress Notes (Signed)
Nursing Progress Note 7p-7a  D) Patient presents pleasant and cooperative. Patient seen interacting in the dayroom with peers. Patient denies SI/HI/AVH or pain. Patient inquired about getting a vitamin B12 injection stating "i get one once a week from my primary care provider". Patient contracts for safety at this time.   A) Emotional support given. Patient medicated with PM orders as prescribed. Medications reviewed with patient. Patient encouraged to talk to morning doctors about changes in vitamin medications. Patient on q15 min safety checks. Opportunities for other questions or concerns presented to patient. Patient encouraged to continue to work on treatment goals.  R) Patient receptive to interaction with nurse. Patient remains safe on the unit at this time. Patient is resting in bed without complaints. Will continue to monitor.

## 2016-12-11 NOTE — Plan of Care (Signed)
Problem: Activity: Goal: Interest or engagement in activities will improve Outcome: Progressing Patient seen attending groups and participating appropriately. Patient is interacting in the dayroom and appropriate on the milieu with peers.    Problem: Education: Goal: Verbalization of understanding the information provided will improve Outcome: Progressing Medications reviewed with patient. Patient verbalized understanding and is without concerns at this time.

## 2016-12-11 NOTE — Progress Notes (Signed)
Patient ID: Leslie Sanders, female   DOB: 1976/04/08, 41 y.o.   MRN: [id number]  DAR: Pt. Denies SI/HI and A/V Hallucinations. She reports just feeling depressed today and is tearful during this interaction. Patient does not report any pain but did report constipation. She received PRN Colace. Support and encouragement provided to the patient. She reports sleep is good, appetite is good, energy level is low, and concentration is good. She reports depression 6/10, hopelessness 4/10, and anxiety 7/10. Scheduled medications administered to patient per physician's orders. Patient is cooperative and is seen in the milieu interacting with peers. She does continue to focus on things such as receiving clothes from the clothes closet, snacks, etc. Patient has to be redirected. For example, patient asked for a coat from the coat closet although she was already wearing a coat. Q15 minute checks are maintained for safety.

## 2016-12-11 NOTE — Progress Notes (Signed)
CSW informed Pt [facility]; provided number to contact them.   Vernie ShanksLauren Wadie Liew, LCSW Clinical Social Work [phone]

## 2016-12-11 NOTE — [facility] Group Notes (Signed)
[facility] Group Notes:  (Nursing/MHT/Case Management/Adjunct)  Date:  12/11/2016  Time:  12:38 PM  Type of Therapy:  Nurse Education  Participation Level:  Active  Participation Quality:  Appropriate  Affect:  Flat  Cognitive:  Alert and Appropriate  Insight:  Appropriate and Improving  Engagement in Group:  Engaged and Improving  Modes of Intervention:  Discussion, Education and Support  Summary of Progress/Problems: Had positive and topical ideas to add to the group on leisure and lifestyles. Wynona LunaBeck, Rigby Swamy K 12/11/2016, 12:38 PM

## 2016-12-12 NOTE — Progress Notes (Signed)
Adult Psychoeducational Group Note  Date:  12/12/2016 Time:  9:01 PM  Group Topic/Focus:  Wrap-Up Group:   The focus of this group is to help patients review their daily goal of treatment and discuss progress on daily workbooks.   Participation Level:  Active  Participation Quality:  Appropriate  Affect:  Appropriate  Cognitive:  Alert, Appropriate and Oriented  Insight: Appropriate  Engagement in Group:  Engaged  Modes of Intervention:  Discussion  Additional Comments:  Patient attended group and said that her day was a 7.  Something positive for the day- She had great conversations with her peers. Leslie Sanders W Ghada Abbett 12/12/2016, 9:01 PM

## 2016-12-12 NOTE — Progress Notes (Signed)
D: Patient denies SI/HI and A/V hallucinations; patient reports sleep is fair; reports appetite is good; reports energy level is normal ; reports ability to concentrate is good; rates depression as 7/10; rates hopelessness 5/10; rates anxiety as 6/10;   A: Monitored q 15 minutes; patient encouraged to attend groups; patient educated about medications; patient given medications per physician orders; patient encouraged to express feelings and/or concerns  R: Patient reported that she had an upsetting phone call from her daughter and that she felt anxious; patient's interaction with staff and peers is appropriate;patient was able to set goal to talk with staff 1:1 when having feelings of SI; patient is taking medications as prescribed and tolerating medications; patient is attending all groups

## 2016-12-12 NOTE — Progress Notes (Signed)
[facility] MD Progress Note  12/12/2016 1:34 PM Leslie Sanders  MRN:  [id number]  Subjective:  Leslie Sanders reports some improvement but endorses ongoing depression & anxiety. However, she says she continues to battle this ongoing depression. Complained of not sleeping at night, denies any nightmares last night. She is future oriented, and has been thinking of moving in with a friend. Has also discussed other options with her CSW, such as [employer] [facility] [place]. Denies medication side effects.  Objective:  I have discussed case with treatment team , and have met with patient. Partially improved, but remains depressed, sad , and becomes tearful during session, when discussing her psychosocial stressors ( having a son with Leslie Sanders, who is now in [facility], living alone) . Denies current suicidal ideations. Reports some dizziness at times, but denies other medication side effects and at this time gait is steady . Visible in milieu- affect is variable, constricted at times, but brightens during interactions with peers . Patient has history of Vitamin B12 and D deficiencies, as well as history of iron deficiency anemia- currently on MVI/iron supplementation. Responds partially to support, empathy, and affect improves partially during session. No disruptive or agitated behaviors on unit. Describes intermittent cravings for alcohol- has expressed interest in going to an inpatient rehab setting such as [facility] on discharge.    Principal Problem: Bipolar Sanders, most recent episode depressed ([place]) Diagnosis:   Patient Active Problem List   Diagnosis Date Noted  . Major depression, recurrent ([place]) [F33.9] 11/27/2016  . Bipolar Sanders, most recent episode depressed ([place]) [F31.30] 10/12/2016  . Anemia, iron deficiency [D50.9] 10/12/2016  . Alcohol use Sanders, moderate, dependence ([place]) [F10.20] 10/12/2016  . Lactose intolerance [E73.9] 10/12/2016   Total Time spent with patient: 20  minutes  Past  Medical History:  Past Medical History:  Diagnosis Date  . Depression    History reviewed. No pertinent surgical history. Family History:  Family History  Problem Relation Age of Onset  . Schizophrenia Maternal Aunt   . ADD / ADHD Son   . ODD Son    Social History:  History  Alcohol Use  . 16.8 oz/week  . 28 Cans of beer per week     History  Drug Use No    Social History   Social History  . Marital status: Divorced    Spouse name: N/A  . Number of children: N/A  . Years of education: N/A   Social History Main Topics  . Smoking status: Current Every Day Smoker    Packs/day: 0.50    Types: Cigarettes  . Smokeless tobacco: Never Used  . Alcohol use 16.8 oz/week    28 Cans of beer per week  . Drug use: No  . Sexual activity: Not Currently   Other Topics Concern  . None   Social History Narrative  . None   Additional Social History:    Pain Medications: Denies Prescriptions: Denies Over the Counter: Denies History of alcohol / drug use?: Yes Longest period of sobriety (when/how long): unknown  Negative Consequences of Use: Financial, Personal relationships Withdrawal Symptoms: Nausea / Vomiting, Irritability, Sweats, Tremors Name of Substance 1: Alcohol 1 - Age of First Use: 29 1 - Amount (size/oz): 6 beers 1 - Frequency: 3x/week 1 - Duration: ongoing 1 - Last Use / Amount: 11/25/16  Sleep: improving  Appetite:improving   Current Medications: Current Facility-Administered Medications  Medication Dose Route Frequency Provider Last Rate Last Dose  . alum & mag hydroxide-simeth (  MAALOX/MYLANTA) 200-200-20 MG/5ML suspension 30 mL  30 mL Oral Q4H PRN Rozetta Nunnery, NP      . ARIPiprazole (ABILIFY) tablet 10 mg  10 mg Oral Daily Kerrie Buffalo, NP   10 mg at 12/12/16 0810  . docusate sodium (COLACE) capsule 100 mg  100 mg Oral BID PRN Jenne Campus, MD   100 mg at 12/12/16 0813  . ferrous sulfate tablet 325 mg  325 mg Oral BID WC Benjamine Mola, FNP   325  mg at 12/12/16 5009  . hydrocortisone cream 0.5 %   Topical BID Laverle Hobby, PA-C      . hydrOXYzine (ATARAX/VISTARIL) tablet 25 mg  25 mg Oral Q6H PRN Laverle Hobby, PA-C   25 mg at 12/09/16 2315  . ibuprofen (ADVIL,MOTRIN) tablet 800 mg  800 mg Oral Q8H PRN Kerrie Buffalo, NP   800 mg at 12/05/16 3818  . lamoTRIgine (LAMICTAL) tablet 25 mg  25 mg Oral Daily Jenne Campus, MD   25 mg at 12/12/16 0810  . lamoTRIgine (LAMICTAL) tablet 50 mg  50 mg Oral QHS Myer Peer Melisssa Donner, MD   50 mg at 12/11/16 2300  . magnesium hydroxide (MILK OF MAGNESIA) suspension 30 mL  30 mL Oral Daily PRN Rozetta Nunnery, NP   30 mL at 12/09/16 1700  . mirtazapine (REMERON) tablet 7.5 mg  7.5 mg Oral QHS Myer Peer Nichelle Renwick, MD   7.5 mg at 12/11/16 2300  . multivitamins with iron tablet 1 tablet  1 tablet Oral Daily Jenne Campus, MD   1 tablet at 12/12/16 0810  . nicotine polacrilex (NICORETTE) gum 2 mg  2 mg Oral PRN Myer Peer Starlina Lapre, MD      . prazosin (MINIPRESS) capsule 1 mg  1 mg Oral QHS Benjamine Mola, FNP   1 mg at 12/11/16 2300   Lab Results:  No results found for this or any previous visit (from the past 48 hour(s)).  Blood Alcohol level:  Lab Results  Component Value Date   ETH <5 11/26/2016   ETH 65 (H) 29/93/7169    Metabolic Sanders Labs: Lab Results  Component Value Date   HGBA1C 5.4 11/29/2016   MPG 108 11/29/2016   Lab Results  Component Value Date   PROLACTIN 62.8 (H) 11/29/2016   Lab Results  Component Value Date   CHOL 173 11/29/2016   TRIG 84 11/29/2016   HDL 88 11/29/2016   CHOLHDL 2.0 11/29/2016   VLDL 17 11/29/2016   LDLCALC 68 11/29/2016   Physical Findings: AIMS: Facial and Oral Movements Muscles of Facial Expression: None, normal Lips and Perioral Area: None, normal Jaw: None, normal Tongue: None, normal,Extremity Movements Upper (arms, wrists, hands, fingers): None, normal Lower (legs, knees, ankles, toes): None, normal, Trunk Movements Neck, shoulders,  hips: None, normal, Overall Severity Severity of abnormal movements (highest score from questions above): None, normal Incapacitation due to abnormal movements: None, normal Patient's awareness of abnormal movements (rate only patient's report): No Awareness, Dental Status Current problems with teeth and/or dentures?: No Does patient usually wear dentures?: No  CIWA:  CIWA-Ar Total: 0 COWS:  COWS Total Score: 1  Musculoskeletal: Strength & Muscle Tone: within normal limits Gait & Station: normal Patient leans: N/A  Psychiatric Specialty Exam: Physical Exam  Nursing note and vitals reviewed. Constitutional: She is oriented to person, place, and time. She appears well-developed.  Neurological: She is alert and oriented to person, place, and time.  Psychiatric: She has  a normal mood and affect. Her behavior is normal.    Review of Systems  Constitutional: Positive for malaise/fatigue.  HENT: Negative.   Respiratory: Negative.   Cardiovascular: Negative.   Musculoskeletal: Negative.   Skin: Negative.   Psychiatric/Behavioral: Positive for depression and substance abuse. Negative for suicidal ideas. The patient is nervous/anxious and has insomnia.   All other systems reviewed and are negative.  denies headache, denies  chest pain, denies shortness of breath, no vomiting  Blood pressure 107/68, pulse 79, temperature 98.7 F (37.1 C), temperature source Oral, resp. rate 16, height '5\' 2"'  (1.575 m), weight 56.7 kg (125 lb), SpO2 100 %.Body mass index is 22.86 kg/m.  General Appearance: improved grooming   Eye Contact:  Good   Speech:  Normal Rate  Volume:  Normal  Mood: remains depressed, partial improvement since admission  Affect:  Remains  constricted but reactive in certain situations   Thought Process:  Linear, goal oriented  Orientation:  Full (Time, Place, and Person)  Thought Content:  denies hallucinations, no delusions, not internally preoccupied  Suicidal Thoughts:  Today  denies suicidal or self injurious ideations   Homicidal Thoughts:  No no homicidal or violent ideations reported /endorsed   Memory: recent and remote grossly intact   Judgement: improving   Insight: improving   Psychomotor Activity:  Normal  Concentration:  Concentration: Good and Attention Span: Good  Recall:  Good  Fund of Knowledge:  Good  Language:  Good  Akathisia:  Negative  Handed:  Right  AIMS (if indicated):     Assets:  Desire for Improvement Resilience  ADL's:  Intact  Cognition:  WNL  Sleep:  Number of Hours: 5.5   Assessment -  Patient has improved partially but remains depressed, and states that she feels more depressed due to anniversary of mother's birthday, and that this time of year is usually difficult for her. Also ruminates about her son, currently in foster care. She does present with a more reactive affect and has been noted to be significantly brighter during interactions with peers. Expresses ongoing apprehension about discharge, becomes anxious when addressing discharge planning. Tolerating medications well, and does state she slept better on Remeron .  Treatment Plan Summary: Bipolar Sanders, most recent episode depressed (HCC) Unstable, managed as below:  Medications:  Continue Abilify 10 mg QDAY for mood control. Continue Lamictal  25 mgrs QAM and 50 mgrs QHS for  mood stabilization.  Continue Remeron 7.5 mgrs QHS for depression, insomnia Continue Minipress 28m QHS for PTSD related nightmares  Treatment team working on disposition planning options- [facility]  - Continue 15 minutes observation for safety concerns - Encouraged to participate in milieu therapy and group therapy counseling sessions and also work with coping skills -  Develop treatment plan to decrease risk of relapse upon discharge and to reduce the need for readmission. -  Psycho-social education regarding relapse prevention and self care. - Health care follow up  as needed for medical problems. - Restart home medications where appropriate.  NEncarnacion Slates NP, PMHNP, FNP-BC 12/12/2016, 1:34 PM   Patient ID: PJearld Lesch female   DOB: [birth year] 41y.o.   MRN: [id number] with NP Progress Note

## 2016-12-12 NOTE — Progress Notes (Signed)
Nursing Progress Note 7p-7a  D) Patient presents anxious but is pleasant and cooperative with Clinical research associatewriter. Patient reports having a stressful day due to "an important phone call, i'm just nervous". Patient denies SI/HI/AVH or pain. Patient contracts for safety at this time.  A) Emotional support given. Patient medicated with PM orders as prescribed. Medications reviewed with patient. Patient on q15 min safety checks. Opportunities for questions or concerns presented to patient. Patient encouraged to continue to work on treatment goals.  R) Patient receptive to interaction with nurse. Patient remains safe on the unit at this time. Patient is resting in bed without complaints. Will continue to monitor.

## 2016-12-12 NOTE — Tx Team (Signed)
Interdisciplinary Treatment and Diagnostic Plan Update  12/12/2016 Time of Session: 10:00 AM Leslie Sanders MRN: [id number]  Principal Diagnosis: Bipolar disorder, most recent episode depressed (HCC)  Secondary Diagnoses: Principal Problem:   Bipolar disorder, most recent episode depressed (HCC) Active Problems:   Major depression, recurrent (HCC)   Current Medications:  Current Facility-Administered Medications  Medication Dose Route Frequency Provider Last Rate Last Dose  . alum & mag hydroxide-simeth (MAALOX/MYLANTA) 200-200-20 MG/5ML suspension 30 mL  30 mL Oral Q4H PRN Jackelyn Poling, NP      . ARIPiprazole (ABILIFY) tablet 10 mg  10 mg Oral Daily Adonis Brook, NP   10 mg at 12/12/16 0810  . docusate sodium (COLACE) capsule 100 mg  100 mg Oral BID PRN Craige Cotta, MD   100 mg at 12/12/16 0813  . ferrous sulfate tablet 325 mg  325 mg Oral BID WC Beau Fanny, FNP   325 mg at 12/12/16 4098  . hydrocortisone cream 0.5 %   Topical BID Kerry Hough, PA-C      . hydrOXYzine (ATARAX/VISTARIL) tablet 25 mg  25 mg Oral Q6H PRN Kerry Hough, PA-C   25 mg at 12/09/16 2315  . ibuprofen (ADVIL,MOTRIN) tablet 800 mg  800 mg Oral Q8H PRN Adonis Brook, NP   800 mg at 12/05/16 1191  . lamoTRIgine (LAMICTAL) tablet 25 mg  25 mg Oral Daily Craige Cotta, MD   25 mg at 12/12/16 0810  . lamoTRIgine (LAMICTAL) tablet 50 mg  50 mg Oral QHS Rockey Situ Cobos, MD   50 mg at 12/11/16 2300  . magnesium hydroxide (MILK OF MAGNESIA) suspension 30 mL  30 mL Oral Daily PRN Jackelyn Poling, NP   30 mL at 12/09/16 1700  . mirtazapine (REMERON) tablet 7.5 mg  7.5 mg Oral QHS Rockey Situ Cobos, MD   7.5 mg at 12/11/16 2300  . multivitamins with iron tablet 1 tablet  1 tablet Oral Daily Craige Cotta, MD   1 tablet at 12/12/16 0810  . nicotine polacrilex (NICORETTE) gum 2 mg  2 mg Oral PRN Rockey Situ Cobos, MD      . prazosin (MINIPRESS) capsule 1 mg  1 mg Oral QHS Beau Fanny, FNP   1 mg at  12/11/16 2300   PTA Medications: Prescriptions Prior to Admission  Medication Sig Dispense Refill Last Dose  . acamprosate (CAMPRAL) 333 MG tablet Take 2 tablets (666 mg total) by mouth 3 (three) times daily with meals. (Patient not taking: Reported on 11/27/2016) 180 tablet 0 Not Taking at Unknown time  . Cyanocobalamin (B-12) 2500 MCG TABS Take 2,500 mcg by mouth daily.  11 Not Taking at Unknown time  . fluticasone (FLONASE) 50 MCG/ACT nasal spray Place 1 spray into the nose daily.   Not Taking at Unknown time  . folic acid (FOLVITE) 1 MG tablet Take 1 mg by mouth daily.  2 Not Taking at Unknown time  . lamoTRIgine (LAMICTAL) 25 MG tablet Take 1 tab (25 mg ) in morning.  Then take 2 tabs (50 mg) in the evening. (Patient not taking: Reported on 11/27/2016) 90 tablet 0 Not Taking  . meloxicam (MOBIC) 15 MG tablet Take 15 mg by mouth daily as needed.   Not Taking at Unknown time  . mirtazapine (REMERON) 7.5 MG tablet Take 1 tablet (7.5 mg total) by mouth at bedtime. (Patient not taking: Reported on 11/27/2016) 30 tablet 0 Not Taking at Unknown time  . nicotine (  NICODERM CQ - DOSED IN MG/24 HOURS) 21 mg/24hr patch Place 1 patch (21 mg total) onto the skin daily. (Patient not taking: Reported on 11/27/2016) 28 patch 0 Not Taking at Unknown time  . OLANZapine (ZYPREXA) 7.5 MG tablet Take 1 tablet (7.5 mg total) by mouth at bedtime. (Patient not taking: Reported on 11/27/2016) 30 tablet 0 Not Taking at Unknown time  . prazosin (MINIPRESS) 1 MG capsule Take 1 capsule (1 mg total) by mouth at bedtime. (Patient not taking: Reported on 11/27/2016) 30 capsule 0 Not Taking at Unknown time  . sertraline (ZOLOFT) 100 MG tablet Take 100 mg by mouth daily.   Not Taking at Unknown time  . tranexamic acid (LYSTEDA) 650 MG TABS tablet Take 1,300 mg by mouth 3 (three) times daily. While on period for five days.   Not Taking at Unknown time  . traZODone (DESYREL) 100 MG tablet Take 100 mg by mouth at bedtime.   Not  Taking at Unknown time    Patient Stressors: Marital or family conflict Substance abuse Other: dealing with special needs child  Patient Strengths: Capable of independent living Wellsite geologistCommunication skills General fund of knowledge Motivation for treatment/growth Physical Health  Treatment Modalities: Medication Management, Group therapy, Case management,  1 to 1 session with clinician, Psychoeducation, Recreational therapy.   Physician Treatment Plan for Primary Diagnosis: Bipolar disorder, most recent episode depressed (HCC) Long Term Goal(s): Improvement in symptoms so as ready for discharge Improvement in symptoms so as ready for discharge   Short Term Goals: Ability to identify changes in lifestyle to reduce recurrence of condition will improve Ability to verbalize feelings will improve Ability to disclose and discuss suicidal ideas Ability to identify changes in lifestyle to reduce recurrence of condition will improve Ability to verbalize feelings will improve Ability to demonstrate self-control will improve Compliance with prescribed medications will improve Ability to identify triggers associated with substance abuse/mental health issues will improve  Medication Management: Evaluate patient's response, side effects, and tolerance of medication regimen.  Therapeutic Interventions: 1 to 1 sessions, Unit Group sessions and Medication administration.  Evaluation of Outcomes: Progressing  Physician Treatment Plan for Secondary Diagnosis: Principal Problem:   Bipolar disorder, most recent episode depressed (HCC) Active Problems:   Major depression, recurrent (HCC)  Long Term Goal(s): Improvement in symptoms so as ready for discharge Improvement in symptoms so as ready for discharge   Short Term Goals: Ability to identify changes in lifestyle to reduce recurrence of condition will improve Ability to verbalize feelings will improve Ability to disclose and discuss suicidal  ideas Ability to identify changes in lifestyle to reduce recurrence of condition will improve Ability to verbalize feelings will improve Ability to demonstrate self-control will improve Compliance with prescribed medications will improve Ability to identify triggers associated with substance abuse/mental health issues will improve     Medication Management: Evaluate patient's response, side effects, and tolerance of medication regimen.  Therapeutic Interventions: 1 to 1 sessions, Unit Group sessions and Medication administration.  Evaluation of Outcomes: Progressing   RN Treatment Plan for Primary Diagnosis: Bipolar disorder, most recent episode depressed (HCC) Long Term Goal(s): Knowledge of disease and therapeutic regimen to maintain health will improve  Short Term Goals: Ability to remain free from injury will improve, Ability to disclose and discuss suicidal ideas, Ability to identify and develop effective coping behaviors will improve and Compliance with prescribed medications will improve  Medication Management: RN will administer medications as ordered by provider, will assess and evaluate patient's response and  provide education to patient for prescribed medication. RN will report any adverse and/or side effects to prescribing provider.  Therapeutic Interventions: 1 on 1 counseling sessions, Psychoeducation, Medication administration, Evaluate responses to treatment, Monitor vital signs and CBGs as ordered, Perform/monitor CIWA, COWS, AIMS and Fall Risk screenings as ordered, Perform wound care treatments as ordered.  Evaluation of Outcomes: Progressing   LCSW Treatment Plan for Primary Diagnosis: Bipolar disorder, most recent episode depressed (HCC) Long Term Goal(s): Safe transition to appropriate next level of care at discharge, Engage patient in therapeutic group addressing interpersonal concerns.  Short Term Goals: Engage patient in aftercare planning with referrals and  resources, Increase social support, Increase emotional regulation, Identify triggers associated with mental health/substance abuse issues and Increase skills for wellness and recovery  Therapeutic Interventions: Assess for all discharge needs, 1 to 1 time with Social worker, Explore available resources and support systems, Assess for adequacy in community support network, Educate family and significant other(s) on suicide prevention, Complete Psychosocial Assessment, Interpersonal group therapy.  Evaluation of Outcomes: Progressing   Progress in Treatment :  Attending groups: Yes Participating in groups: Yes Taking medication as prescribed: Yes, MD continuing to assess for appropriate medication regimen Toleration medication: Yes Family/Significant other contact made: No, Pt declines Patient understands diagnosis: Yes Discussing patient identified problems/goals with staff: Yes Medical problems stabilized or resolved: Yes Denies suicidal/homicidal ideation: Yes Issues/concerns per patient self-inventory: None reported Other: N/A  New problem(s) identified: None reported at this time  New Short Term/Long Term Goal(s): None at this time  Discharge Plan or Barriers: Pt is deciding between returning home with outpatient services or seeking residential treatment. Expresses concern about willingness of foster care provider to continue to care for son while she seeks residential treatment, is exploring options for inpt vs outpt tx for alcohol abuse.    12/12/16: Pt has been referred to [facility] and nurse is reviewing referral. Phone interview completed on 1/4.  Reason for Continuation of Hospitalization: Anxiety Depression Medication stabilization Withdrawal symptoms  Estimated Length of Stay: 2-4 days    Attendees:  Patient:  Physician: Elvera Maria MD; Dr. Jama Flavors Nursing: Liborio Nixon, RN; Leighton Parody, RN  12/12/2016 11:02 AM  RN Care Manager:  Social Workers: Santa Genera LCSW,    12/12/2016 11:02 AM  Nurse Pratictioners: Armandina Stammer, NP 12/12/2016 9:30am Other:     Scribe for Treatment Team: Vernie Shanks, LCSW Clinical Social Work [phone]

## 2016-12-12 NOTE — Progress Notes (Signed)
Recreation Therapy Notes  Date: 12/12/16 Time: 0930 Location: [address] Dayroom  Group Topic: Stress Management  Goal Area(s) Addresses:  Patient will verbalize importance of using healthy stress management.  Patient will identify positive emotions associated with healthy stress management.   Behavioral Response: Engaged  Intervention:  Stress Management  Activity :  [facility] Visualization.  LRT introduced the stress management technique of guided imagery to patients.  LRT read a script to guide patients through the technique so they could engage in the process.  Patients were to follow along as LRT read script.  Education:  Stress Management, Discharge Planning.   Education Outcome: Acknowledges edcuation/In group clarification offered/Needs additional education  Clinical Observations/Feedback:  Pt attended group.    Caroll RancherMarjette Mikkel Charrette, LRT/CTRS         Lillia AbedLindsay, Mohsin Crum A 12/12/2016 11:22 AM

## 2016-12-12 NOTE — Plan of Care (Signed)
Problem: Coping: Goal: Ability to verbalize feelings will improve Outcome: Progressing Patient able to verbalize feelings to writer more than yesterday.

## 2016-12-13 MED ORDER — ARIPIPRAZOLE 15 MG PO TABS
15.0000 mg | ORAL_TABLET | Freq: Every day | ORAL | Status: DC
  Administered 2016-12-14 – 2016-12-15 (×2): 15 mg via ORAL
  Filled 2016-12-13 (×3): qty 1

## 2016-12-13 NOTE — Progress Notes (Signed)
[facility] MD Progress Note  12/13/2016 12:04 PM ADRIONNA DELCID  MRN:  [id number]  Subjective:  Lafonda reports some improvement but endorses ongoing depression & anxiety. Still endorses feeling down and was having headache of porr sleep She is future oriented, and has been thinking of moving in with a friend. Has also discussed other options with her CSW, such as [employer] / [facility]. Denies medication side effects.  Objective:  I have discussed case with treatment team , and have met with patient. Partially remains depressed but was not tearful. Slow improvement. Reviewed meds and possible increasing abilify Reports some dizziness at times, but denies other medication side effects and at this time gait is steady . Visible in milieu- affect is variable, constricted at times, but brightens during interactions with peers . Patient has history of Vitamin B12 and D deficiencies, as well as history of iron deficiency anemia- currently on MVI/iron supplementation. Responds partially to support, empathy, and affect improves partially during session. No disruptive or agitated behaviors on unit. Describes intermittent cravings for alcohol- has expressed interest in going to an inpatient rehab setting such as [facility] on discharge.    Principal Problem: Bipolar disorder, most recent episode depressed (HCC) Diagnosis:   Patient Active Problem List   Diagnosis Date Noted  . Major depression, recurrent (HCC) [F33.9] 11/27/2016  . Bipolar disorder, most recent episode depressed (HCC) [F31.30] 10/12/2016  . Anemia, iron deficiency [D50.9] 10/12/2016  . Alcohol use disorder, moderate, dependence (HCC) [F10.20] 10/12/2016  . Lactose intolerance [E73.9] 10/12/2016   Total Time spent with patient: 20  minutes  Past Medical History:  Past Medical History:  Diagnosis Date  . Depression    History reviewed. No pertinent surgical history. Family History:  Family History  Problem Relation Age of Onset  . Schizophrenia  Maternal Aunt   . ADD / ADHD Son   . ODD Son    Social History:  History  Alcohol Use  . 16.8 oz/week  . 28 Cans of beer per week     History  Drug Use No    Social History   Social History  . Marital status: Divorced    Spouse name: N/A  . Number of children: N/A  . Years of education: N/A   Social History Main Topics  . Smoking status: Current Every Day Smoker    Packs/day: 0.50    Types: Cigarettes  . Smokeless tobacco: Never Used  . Alcohol use 16.8 oz/week    28 Cans of beer per week  . Drug use: No  . Sexual activity: Not Currently   Other Topics Concern  . None   Social History Narrative  . None   Additional Social History:    Pain Medications: Denies Prescriptions: Denies Over the Counter: Denies History of alcohol / drug use?: Yes Longest period of sobriety (when/how long): unknown  Negative Consequences of Use: Financial, Personal relationships Withdrawal Symptoms: Nausea / Vomiting, Irritability, Sweats, Tremors Name of Substance 1: Alcohol 1 - Age of First Use: 29 1 - Amount (size/oz): 6 beers 1 - Frequency: 3x/week 1 - Duration: ongoing 1 - Last Use / Amount: 11/25/16  Sleep: improving  Appetite:improving   Current Medications: Current Facility-Administered Medications  Medication Dose Route Frequency Provider Last Rate Last Dose  . alum & mag hydroxide-simeth (MAALOX/MYLANTA) 200-200-20 MG/5ML suspension 30 mL  30 mL Oral Q4H PRN Jackelyn Poling, NP      . Melene Muller ON 12/14/2016] ARIPiprazole (ABILIFY) tablet 15 mg  15 mg  Oral Daily Thresa Ross, MD      . docusate sodium (COLACE) capsule 100 mg  100 mg Oral BID PRN Craige Cotta, MD   100 mg at 12/12/16 0813  . ferrous sulfate tablet 325 mg  325 mg Oral BID WC Beau Fanny, FNP   325 mg at 12/13/16 0818  . hydrocortisone cream 0.5 %   Topical BID Kerry Hough, PA-C      . hydrOXYzine (ATARAX/VISTARIL) tablet 25 mg  25 mg Oral Q6H PRN Kerry Hough, PA-C   25 mg at 12/12/16 1656  .  ibuprofen (ADVIL,MOTRIN) tablet 800 mg  800 mg Oral Q8H PRN Adonis Brook, NP   800 mg at 12/13/16 0617  . lamoTRIgine (LAMICTAL) tablet 25 mg  25 mg Oral Daily Craige Cotta, MD   25 mg at 12/13/16 0818  . lamoTRIgine (LAMICTAL) tablet 50 mg  50 mg Oral QHS Craige Cotta, MD   50 mg at 12/12/16 2318  . magnesium hydroxide (MILK OF MAGNESIA) suspension 30 mL  30 mL Oral Daily PRN Jackelyn Poling, NP   30 mL at 12/09/16 1700  . mirtazapine (REMERON) tablet 7.5 mg  7.5 mg Oral QHS Rockey Situ Cobos, MD   7.5 mg at 12/12/16 2319  . multivitamins with iron tablet 1 tablet  1 tablet Oral Daily Craige Cotta, MD   1 tablet at 12/13/16 0818  . nicotine polacrilex (NICORETTE) gum 2 mg  2 mg Oral PRN Craige Cotta, MD      . prazosin (MINIPRESS) capsule 1 mg  1 mg Oral QHS Beau Fanny, FNP   1 mg at 12/12/16 2318   Lab Results:  No results found for this or any previous visit (from the past 48 hour(s)).  Blood Alcohol level:  Lab Results  Component Value Date   ETH <5 11/26/2016   ETH 65 (H) 10/11/2016    Metabolic Disorder Labs: Lab Results  Component Value Date   HGBA1C 5.4 11/29/2016   MPG 108 11/29/2016   Lab Results  Component Value Date   PROLACTIN 62.8 (H) 11/29/2016   Lab Results  Component Value Date   CHOL 173 11/29/2016   TRIG 84 11/29/2016   HDL 88 11/29/2016   CHOLHDL 2.0 11/29/2016   VLDL 17 11/29/2016   LDLCALC 68 11/29/2016     Psychiatric Specialty Exam: Physical Exam  Nursing note and vitals reviewed. Constitutional: She is oriented to person, place, and time. She appears well-developed.  Neurological: She is alert and oriented to person, place, and time.  Psychiatric: She has a normal mood and affect. Her behavior is normal.    Review of Systems  Constitutional: Positive for malaise/fatigue.  Cardiovascular: Negative for chest pain.  Gastrointestinal: Negative for nausea.  Psychiatric/Behavioral: Positive for depression. Negative for suicidal  ideas. The patient is nervous/anxious.   All other systems reviewed and are negative.  denies headache, denies  chest pain, denies shortness of breath, no vomiting  Blood pressure 99/66, pulse 93, temperature 97.8 F (36.6 C), temperature source Oral, resp. rate 18, height 5\' 2"  (1.575 m), weight 56.7 kg (125 lb), SpO2 100 %.Body mass index is 22.86 kg/m.  General Appearance: improved grooming   Eye Contact:  Good   Speech:  Normal Rate  Volume:  Normal  Mood: remains sad and passive  Affect:  Remains  constricted but reactive in certain situations   Thought Process:  Linear, goal oriented  Orientation:  Full (Time, Place,  and Person)  Thought Content:  denies hallucinations, no delusions, not internally preoccupied  Suicidal Thoughts:  Today denies suicidal or self injurious ideations   Homicidal Thoughts:  No no homicidal or violent ideations reported /endorsed   Memory: recent and remote grossly intact   Judgement: improving   Insight: improving   Psychomotor Activity:  Normal  Concentration:  Concentration: Good and Attention Span: Good  Recall:  Good  Fund of Knowledge:  Good  Language:  Good  Akathisia:  Negative  Handed:  Right  AIMS (if indicated):     Assets:  Desire for Improvement Resilience  ADL's:  Intact  Cognition:  WNL  Sleep:  Number of Hours: 6   .  Treatment Plan Summary: Bipolar disorder, most recent episode depressed ([place]) Unstable, managed as below:  Medications:  Continue Abilify but increase to 33m   QDAY for mood control. Continue Lamictal  25 mgrs QAM and 50 mgrs QHS for  mood stabilization.  Continue Remeron 7.5 mgrs QHS for depression, insomnia Continue Minipress 131mQHS for PTSD related nightmares  Treatment team working on disposition planning options- [facility] Continue 15 minutes observation for safety concerns - Encouraged to participate in milieu therapy and group therapy counseling sessions and also work  with coping skills -  Develop treatment plan to decrease risk of relapse upon discharge and to reduce the need for readmission. -  Psycho-social education regarding relapse prevention and self care. - Health care follow up as needed for medical problems. - Restart home medications where appropriate.  Ayala Ribble, MD,12/13/2016, 12:04 PM

## 2016-12-13 NOTE — Progress Notes (Signed)
D: Pt presents anxious on approach. Pt reports mood swings of hight anxiety and depression today. Pt rates depression 7/10. Anxiety 4/10. Pt denies suicidal thoughts today. Pt reports poor sleep along with nightmares last night at bedtime. Pt stated that she plans to attend [facility] for treatment upon discharge. Pt afraid that she may relapse on alcohol. Pt stated that she's considering taking Campral for cravings. Pt advised to speak with MD.  A: Medications reviewed with pt. Medications administered as ordered per MD. Verbal support provided. Pt encouraged to attend groups. 15 minute checks performed for safety. R: Pt receptive to treatment. Pt verbalized understanding of med regimen.

## 2016-12-13 NOTE — Progress Notes (Signed)
D: Pt at the time of assessment was alert and oriented x4. Pt endorsed moderate anxiety and depression; state, "I don't feel good when I call, they don't pick the call and they don't back." Pt was however seen interacting with peers appropriately. Pt remained calm and cooperative. A: Medications offered as prescribed.  Support, encouragement, and safe environment provided.  15-minute safety checks continue. R: Pt was med compliant.  Pt attended group. Safety checks continue

## 2016-12-13 NOTE — Progress Notes (Signed)
Adult Psychoeducational Group Note  Date:  12/13/2016 Time:  10:14 PM  Group Topic/Focus:  Wrap-Up Group:   The focus of this group is to help patients review their daily goal of treatment and discuss progress on daily workbooks.   Participation Level:  Active  Participation Quality:  Appropriate  Affect:  Appropriate  Cognitive:  Appropriate  Insight: Appropriate  Engagement in Group:  Engaged  Modes of Intervention:  Discussion  Additional Comments:  Patient attended wrap-up group and said that her day was a 8.6.  Patient say she found joy in just talking to others.  Liem Copenhaver W Benetta Maclaren 12/13/2016, 10:14 PM

## 2016-12-13 NOTE — B[facility] Group Notes (Signed)
Adult Therapy Group Note  Date:  12/13/2016 Time:  10:00-11:15AM Group Topic/Focus: Compare and contrast patients' current "I am...." statements to the visions patients identified as desirable for their lives.  This elicited discussion about patient fears and how they can go about making positive changes in their cognitions that will positively impact their behaviors.  Many expressions of similarities and mutual support were provided among group members.  A motivational 3-minute speech was played and patients were left with the task of thinking about what "I am...." statements they can start using in their lives immediately.  Participation Level:  Active  Participation Quality:  Attentive, Sharing and Supportive  Affect:  Appropriate  Cognitive:  Appropriate and Oriented  Insight: Good  Engagement in Group:  Engaged  Modes of Intervention:  Activity, Discussion and Support  Additional Comments:  Patient stated she is a people pleaser, loves to solve problems.  She talked several times about how much she has learned about herself in recent days during this 2nd hospitalization.  She provided a lot of words to list on the whiteboard, and she had questions and comments that were integral to the discussion.  Carloyn JaegerMareida J Grossman-Orr 12/13/2016, 1:57 PM

## 2016-12-14 MED ORDER — HYDROXYZINE HCL 50 MG PO TABS
50.0000 mg | ORAL_TABLET | Freq: Every evening | ORAL | Status: DC | PRN
  Filled 2016-12-14: qty 21

## 2016-12-14 NOTE — Progress Notes (Signed)
D: Pt at the time of assessment was alert and oriented x4. Pt continue to endorse moderate anxiety and depression; state, "I don't feel good when I call, they don't pick the call and they don't back." Pt was however seen interacting with peers appropriately. Pt remained calm and cooperative. A: Medications offered as prescribed.  Support, encouragement, and safe environment provided.  15-minute safety checks continue. R: Pt was med compliant.  Pt attended group. Safety checks continue

## 2016-12-14 NOTE — B[facility] Group Notes (Signed)
B[facility] Group Notes:  (Clinical Social Work)   12/14/2016    10:00-11:15AM  Summary of Progress/Problems:   The main focus of today's process group was to   1)  discuss the importance of adding supports  2)  identify the patient's current unhealthy supports and plan how to handle them  3)  Identify the patient's current healthy supports and plan what to add.  An emphasis was placed on using counselor, doctor, therapy groups, 12-step groups, and problem-specific support groups to expand supports.  Healthy boundaries were also discussed.  The patient expressed full comprehension of the concepts presented, and agreed that there is a need to add more supports.  The patient stated her mentor, while long distant, is still a good support and [name] is being selfish right now and is unhealthy for her.  She talked about the boundaries she has decided she has to set in place, and how she plans to go about that process.  Type of Therapy:  Process Group with Motivational Interviewing  Participation Level:  Active  Participation Quality:  Attentive, Sharing and Supportive  Affect:  Blunted  Cognitive:  Alert, Appropriate and Oriented  Insight:  Engaged  Engagement in Therapy:  Engaged  Modes of Intervention:   Education, Support and Processing, Activity  Leslie MantleMareida Grossman-Orr, LCSW 12/14/2016    12:36 PM

## 2016-12-14 NOTE — Progress Notes (Signed)
D: Patient's self inventory sheet: patient has good sleep, recieved sleep medication.good  Appetite, did not address energy level, did not address concentration. Rated depression 7.5/10, hopeless 4/10, anxiety 8/10. SI/HI/AVH: Denies all. Physical complaints are denied. Goal is "to stay calm think positive and not let anyone spoil my day". Plans to work on "deep breathing exercises".   A: Medications administered, assessed medication knowledge and education given on medication regimen.  Emotional support and encouragement given patient. R: Denies SI and HI , contracts for safety. Safety maintained with 15 minute checks.

## 2016-12-14 NOTE — Progress Notes (Signed)
Adult Psychoeducational Group Note  Date:  12/14/2016 Time:  9:10 PM  Group Topic/Focus:  Wrap-Up Group:   The focus of this group is to help patients review their daily goal of treatment and discuss progress on daily workbooks.   Participation Level:  Active  Participation Quality:  Attentive  Affect:  Appropriate  Cognitive:  Alert, Appropriate and Oriented  Insight: Appropriate  Engagement in Group:  Engaged  Modes of Intervention:  Discussion  Additional Comments:  Patient attended group and said that her day was a 9. Her goal for today was to socialize more and she did. Mariya Mottley W Candus Braud 12/14/2016, 9:10 PM

## 2016-12-14 NOTE — B[facility] Group Notes (Signed)
   Date:  12/14/2016  Time:  0930  Type of Therapy:  Nurse Education  Goals Group: The group focuses on teaching patients how to develop attainable goals that will aide them in their recovery.  Participation Level:  Active  Participation Quality:  Attentive  Affect:  Appropriate  Cognitive:  Alert  Insight:  Appropriate  Engagement in Group:  Engaged  Modes of Intervention:  Education  Summary of Progress/Problems:  Rich BraveDuke, Davin Archuletta Lynn 12/14/2016, 4:39 PM

## 2016-12-14 NOTE — Progress Notes (Signed)
[facility] MD Progress Note  12/14/2016 11:12 AM Leslie Sanders  MRN:  [id number]  Subjective:  Zierra reports some improvement but endorses ongoing depression & anxiety. Still endorses feeling down and was having headache of porr sleep. Has also discussed other options with her CSW, such as [employer] / [place]. Denies medication side effects.  Objective:  I have discussed case with treatment team , and have met with patient. abilify was increased to 39m yesterday and has helped some  But remains  Depressed,  not tearful. Slow improvement. Reviewed meds and possible increasing abilify Reports some dizziness at times, but denies other medication side effects and at this time gait is steady . Visible in milieu- affect is variable, constricted at times, but brightens during interactions with peers . Patient has history of Vitamin B12 and D deficiencies, as well as history of iron deficiency anemia- currently on MVI/iron supplementation. Responds partially to support, empathy, and affect improves partially during session. No disruptive or agitated behaviors on unit. Describes intermittent cravings for alcohol- has expressed interest in going to an inpatient rehab setting such as [facility] on discharge.    Principal Problem: Bipolar disorder, most recent episode depressed ([place] Diagnosis:   Patient Active Problem List   Diagnosis Date Noted  . Major depression, recurrent ([place] [F33.9] 11/27/2016  . Bipolar disorder, most recent episode depressed ([place] [F31.30] 10/12/2016  . Anemia, iron deficiency [D50.9] 10/12/2016  . Alcohol use disorder, moderate, dependence ([place] [F10.20] 10/12/2016  . Lactose intolerance [E73.9] 10/12/2016   Total Time spent with patient: 20  minutes  Past Medical History:  Past Medical History:  Diagnosis Date  . Depression    History reviewed. No pertinent surgical history. Family History:  Family History  Problem Relation Age of Onset  . Schizophrenia Maternal Aunt   .  ADD / ADHD Son   . ODD Son    Social History:  History  Alcohol Use  . 16.8 oz/week  . 28 Cans of beer per week     History  Drug Use No    Social History   Social History  . Marital status: Divorced    Spouse name: N/A  . Number of children: N/A  . Years of education: N/A   Social History Main Topics  . Smoking status: Current Every Day Smoker    Packs/day: 0.50    Types: Cigarettes  . Smokeless tobacco: Never Used  . Alcohol use 16.8 oz/week    28 Cans of beer per week  . Drug use: No  . Sexual activity: Not Currently   Other Topics Concern  . None   Social History Narrative  . None   Additional Social History:    Pain Medications: Denies Prescriptions: Denies Over the Counter: Denies History of alcohol / drug use?: Yes Longest period of sobriety (when/how long): unknown  Negative Consequences of Use: Financial, Personal relationships Withdrawal Symptoms: Nausea / Vomiting, Irritability, Sweats, Tremors Name of Substance 1: Alcohol 1 - Age of First Use: 29 1 - Amount (size/oz): 6 beers 1 - Frequency: 3x/week 1 - Duration: ongoing 1 - Last Use / Amount: 11/25/16  Sleep: improving  Appetite:improving   Current Medications: Current Facility-Administered Medications  Medication Dose Route Frequency Provider Last Rate Last Dose  . alum & mag hydroxide-simeth (MAALOX/MYLANTA) 200-200-20 MG/5ML suspension 30 mL  30 mL Oral Q4H PRN JRozetta Nunnery NP      . ARIPiprazole (ABILIFY) tablet 15 mg  15 mg Oral Daily NMerian Capron MD  15 mg at 12/14/16 0813  . docusate sodium (COLACE) capsule 100 mg  100 mg Oral BID PRN Jenne Campus, MD   100 mg at 12/14/16 0818  . ferrous sulfate tablet 325 mg  325 mg Oral BID WC Benjamine Mola, FNP   325 mg at 12/14/16 8177  . hydrocortisone cream 0.5 %   Topical BID Laverle Hobby, PA-C      . hydrOXYzine (ATARAX/VISTARIL) tablet 25 mg  25 mg Oral Q6H PRN Laverle Hobby, PA-C   25 mg at 12/12/16 1656  . ibuprofen  (ADVIL,MOTRIN) tablet 800 mg  800 mg Oral Q8H PRN Kerrie Buffalo, NP   800 mg at 12/13/16 0617  . lamoTRIgine (LAMICTAL) tablet 25 mg  25 mg Oral Daily Jenne Campus, MD   25 mg at 12/14/16 0814  . lamoTRIgine (LAMICTAL) tablet 50 mg  50 mg Oral QHS Jenne Campus, MD   50 mg at 12/13/16 2304  . magnesium hydroxide (MILK OF MAGNESIA) suspension 30 mL  30 mL Oral Daily PRN Rozetta Nunnery, NP   30 mL at 12/09/16 1700  . mirtazapine (REMERON) tablet 7.5 mg  7.5 mg Oral QHS Myer Peer Cobos, MD   7.5 mg at 12/13/16 2303  . multivitamins with iron tablet 1 tablet  1 tablet Oral Daily Jenne Campus, MD   1 tablet at 12/14/16 1165  . nicotine polacrilex (NICORETTE) gum 2 mg  2 mg Oral PRN Jenne Campus, MD      . prazosin (MINIPRESS) capsule 1 mg  1 mg Oral QHS Benjamine Mola, FNP   1 mg at 12/13/16 2304   Lab Results:  No results found for this or any previous visit (from the past 107 hour(s)).  Blood Alcohol level:  Lab Results  Component Value Date   ETH <5 11/26/2016   ETH 65 (H) 79/02/8332    Metabolic Disorder Labs: Lab Results  Component Value Date   HGBA1C 5.4 11/29/2016   MPG 108 11/29/2016   Lab Results  Component Value Date   PROLACTIN 62.8 (H) 11/29/2016   Lab Results  Component Value Date   CHOL 173 11/29/2016   TRIG 84 11/29/2016   HDL 88 11/29/2016   CHOLHDL 2.0 11/29/2016   VLDL 17 11/29/2016   LDLCALC 68 11/29/2016     Psychiatric Specialty Exam: Physical Exam  Nursing note and vitals reviewed. Constitutional: She is oriented to person, place, and time. She appears well-developed.  Neurological: She is alert and oriented to person, place, and time.  Psychiatric: She has a normal mood and affect. Her behavior is normal.    Review of Systems  Constitutional: Positive for malaise/fatigue.  Cardiovascular: Negative for palpitations.  Gastrointestinal: Negative for nausea.  Psychiatric/Behavioral: Positive for depression. Negative for suicidal ideas.   All other systems reviewed and are negative.  denies headache, denies  chest pain, denies shortness of breath, no vomiting  Blood pressure 123/81, pulse (!) 125, temperature 98.3 F (36.8 C), resp. rate 18, height '5\' 2"'  (1.575 m), weight 56.7 kg (125 lb), SpO2 100 %.Body mass index is 22.86 kg/m.  General Appearance: improved grooming   Eye Contact:  Good   Speech:  Normal Rate  Volume:  Normal  Mood: sad affect and mood  Affect:  Remains  constricted but reactive in certain situations   Thought Process:  Linear, goal oriented  Orientation:  Full (Time, Place, and Person)  Thought Content:  denies hallucinations, no delusions, not internally  preoccupied  Suicidal Thoughts:  Today denies suicidal or self injurious ideations   Homicidal Thoughts:  No no homicidal or violent ideations reported /endorsed   Memory: recent and remote grossly intact   Judgement: improving   Insight: improving   Psychomotor Activity:  Normal  Concentration:  Concentration: Good and Attention Span: Good  Recall:  Good  Fund of Knowledge:  Good  Language:  Good  Akathisia:  Negative  Handed:  Right  AIMS (if indicated):     Assets:  Desire for Improvement Resilience  ADL's:  Intact  Cognition:  WNL  Sleep:  Number of Hours: 5.5   .  Treatment Plan Summary: Bipolar disorder, most recent episode depressed ([place]) Unstable, managed as below:  Medications:  No changes in current treatment plan or meds done today Monitor anxiety and vitals. Call back if concerns to on call provider.  Continue Abilify  20m   QDAY for mood control. Continue Lamictal  25 mgrs QAM and 50 mgrs QHS for  mood stabilization.  Continue Remeron 7.5 mgrs QHS for depression, insomnia Continue Minipress 159mQHS for PTSD related nightmares  Treatment team working on disposition planning options- [facility] - Continue 15 minutes observation for safety concerns - Encouraged to participate in milieu therapy  and group therapy counseling sessions and also work with coping skills -  Develop treatment plan to decrease risk of relapse upon discharge and to reduce the need for readmission. -  Psycho-social education regarding relapse prevention and self care. - Health care follow up as needed for medical problems. - Restart home medications where appropriate.  Daniell Mancinas, MD,12/14/2016, 11:12 AM

## 2016-12-15 MED ORDER — HYDROXYZINE HCL 50 MG PO TABS: 50.0000 mg | ORAL_TABLET | Freq: Every evening | ORAL | 0 refills | Status: DC | PRN

## 2016-12-15 MED ORDER — DOCUSATE SODIUM 100 MG PO CAPS: 100.0000 mg | ORAL_CAPSULE | Freq: Two times a day (BID) | ORAL | 0 refills | Status: DC | PRN

## 2016-12-15 MED ORDER — MIRTAZAPINE 7.5 MG PO TABS: 7.5000 mg | ORAL_TABLET | Freq: Every day | ORAL | 0 refills | Status: DC

## 2016-12-15 MED ORDER — ARIPIPRAZOLE 15 MG PO TABS: 15.0000 mg | ORAL_TABLET | Freq: Every day | ORAL | 0 refills | Status: DC

## 2016-12-15 MED ORDER — LAMOTRIGINE 25 MG PO TABS: ORAL_TABLET | ORAL | 0 refills | Status: DC

## 2016-12-15 MED ORDER — LAMOTRIGINE 25 MG PO TABS: 50.0000 mg | ORAL_TABLET | Freq: Every day | ORAL | 0 refills | Status: DC

## 2016-12-15 MED ORDER — PRAZOSIN HCL 1 MG PO CAPS: 1.0000 mg | ORAL_CAPSULE | Freq: Every day | ORAL | 0 refills | Status: DC

## 2016-12-15 MED ORDER — FERROUS SULFATE 325 (65 FE) MG PO TABS: 325.0000 mg | ORAL_TABLET | Freq: Two times a day (BID) | ORAL | 0 refills | Status: AC

## 2016-12-15 NOTE — Progress Notes (Signed)
Recreation Therapy Notes   Date: 12/15/16 Time: 0930 Location: 300 Hall Group Room  Group Topic: Stress Management  Goal Area(s) Addresses:  Patient will verbalize importance of using healthy stress management.  Patient will identify positive emotions associated with healthy stress management.   Intervention: Stress Management   Activity :  Body Scan Meditation.  LRT introduced the stress management technique of meditation to patients.  LRT played a meditation from the Calm app to allow patients the opportunity to engage in the meditation.  Patients were to follow along as the meditation played.  Education:  Stress Management, Discharge Planning.   Education Outcome: Acknowledges edcuation/In group clarification offered/Needs additional education  Clinical Observations/Feedback: Pt did not attend group.    Hamish Banks, LRT/CTRS         Rondalyn Belford A 12/15/2016 12:22 PM 

## 2016-12-15 NOTE — Tx Team (Signed)
Interdisciplinary Treatment and Diagnostic Plan Update  12/15/2016 Time of Session: 10:00 AM Leslie Sanders MRN: [id number]  Principal Diagnosis: Bipolar disorder, most recent episode depressed (HCC)  Secondary Diagnoses: Principal Problem:   Bipolar disorder, most recent episode depressed (HCC) Active Problems:   Major depression, recurrent (HCC)   Current Medications:  Current Facility-Administered Medications  Medication Dose Route Frequency Provider Last Rate Last Dose  . alum & mag hydroxide-simeth (MAALOX/MYLANTA) 200-200-20 MG/5ML suspension 30 mL  30 mL Oral Q4H PRN Jackelyn PolingJason A Berry, NP      . ARIPiprazole (ABILIFY) tablet 15 mg  15 mg Oral Daily Thresa RossNadeem Akhtar, MD   15 mg at 12/15/16 0807  . docusate sodium (COLACE) capsule 100 mg  100 mg Oral BID PRN Craige CottaFernando A Cobos, MD   100 mg at 12/15/16 0809  . ferrous sulfate tablet 325 mg  325 mg Oral BID WC Beau FannyJohn C Withrow, FNP   325 mg at 12/15/16 [phone]  . hydrocortisone cream 0.5 %   Topical BID Kerry HoughSpencer E Simon, PA-C      . hydrOXYzine (ATARAX/VISTARIL) tablet 25 mg  25 mg Oral Q6H PRN Kerry HoughSpencer E Simon, PA-C   25 mg at 12/12/16 1656  . hydrOXYzine (ATARAX/VISTARIL) tablet 50 mg  50 mg Oral QHS PRN Thresa RossNadeem Akhtar, MD      . ibuprofen (ADVIL,MOTRIN) tablet 800 mg  800 mg Oral Q8H PRN Adonis BrookSheila Agustin, NP   800 mg at 12/13/16 0617  . lamoTRIgine (LAMICTAL) tablet 25 mg  25 mg Oral Daily Craige CottaFernando A Cobos, MD   25 mg at 12/15/16 [id number]  . lamoTRIgine (LAMICTAL) tablet 50 mg  50 mg Oral QHS Craige CottaFernando A Cobos, MD   50 mg at 12/14/16 2310  . magnesium hydroxide (MILK OF MAGNESIA) suspension 30 mL  30 mL Oral Daily PRN Jackelyn PolingJason A Berry, NP   30 mL at 12/09/16 1700  . mirtazapine (REMERON) tablet 7.5 mg  7.5 mg Oral QHS Rockey SituFernando A Cobos, MD   7.5 mg at 12/14/16 2310  . multivitamins with iron tablet 1 tablet  1 tablet Oral Daily Craige CottaFernando A Cobos, MD   1 tablet at 12/15/16 [phone]  . nicotine polacrilex (NICORETTE) gum 2 mg  2 mg Oral PRN Rockey SituFernando A Cobos, MD       . prazosin (MINIPRESS) capsule 1 mg  1 mg Oral QHS Beau FannyJohn C Withrow, FNP   1 mg at 12/14/16 2310   PTA Medications: Prescriptions Prior to Admission  Medication Sig Dispense Refill Last Dose  . acamprosate (CAMPRAL) 333 MG tablet Take 2 tablets (666 mg total) by mouth 3 (three) times daily with meals. (Patient not taking: Reported on 11/27/2016) 180 tablet 0 Not Taking at Unknown time  . Cyanocobalamin (B-12) 2500 MCG TABS Take 2,500 mcg by mouth daily.  11 Not Taking at Unknown time  . fluticasone (FLONASE) 50 MCG/ACT nasal spray Place 1 spray into the nose daily.   Not Taking at Unknown time  . folic acid (FOLVITE) 1 MG tablet Take 1 mg by mouth daily.  2 Not Taking at Unknown time  . lamoTRIgine (LAMICTAL) 25 MG tablet Take 1 tab (25 mg ) in morning.  Then take 2 tabs (50 mg) in the evening. (Patient not taking: Reported on 11/27/2016) 90 tablet 0 Not Taking  . meloxicam (MOBIC) 15 MG tablet Take 15 mg by mouth daily as needed.   Not Taking at Unknown time  . mirtazapine (REMERON) 7.5 MG tablet Take 1 tablet (7.5 mg total) by  mouth at bedtime. (Patient not taking: Reported on 11/27/2016) 30 tablet 0 Not Taking at Unknown time  . nicotine (NICODERM CQ - DOSED IN MG/24 HOURS) 21 mg/24hr patch Place 1 patch (21 mg total) onto the skin daily. (Patient not taking: Reported on 11/27/2016) 28 patch 0 Not Taking at Unknown time  . OLANZapine (ZYPREXA) 7.5 MG tablet Take 1 tablet (7.5 mg total) by mouth at bedtime. (Patient not taking: Reported on 11/27/2016) 30 tablet 0 Not Taking at Unknown time  . prazosin (MINIPRESS) 1 MG capsule Take 1 capsule (1 mg total) by mouth at bedtime. (Patient not taking: Reported on 11/27/2016) 30 capsule 0 Not Taking at Unknown time  . sertraline (ZOLOFT) 100 MG tablet Take 100 mg by mouth daily.   Not Taking at Unknown time  . tranexamic acid (LYSTEDA) 650 MG TABS tablet Take 1,300 mg by mouth 3 (three) times daily. While on period for five days.   Not Taking at Unknown  time  . traZODone (DESYREL) 100 MG tablet Take 100 mg by mouth at bedtime.   Not Taking at Unknown time    Patient Stressors: Marital or family conflict Substance abuse Other: dealing with special needs child  Patient Strengths: Capable of independent living Wellsite geologist fund of knowledge Motivation for treatment/growth Physical Health  Treatment Modalities: Medication Management, Group therapy, Case management,  1 to 1 session with clinician, Psychoeducation, Recreational therapy.   Physician Treatment Plan for Primary Diagnosis: Bipolar disorder, most recent episode depressed (HCC) Long Term Goal(s): Improvement in symptoms so as ready for discharge Improvement in symptoms so as ready for discharge   Short Term Goals: Ability to identify changes in lifestyle to reduce recurrence of condition will improve Ability to verbalize feelings will improve Ability to disclose and discuss suicidal ideas Ability to identify changes in lifestyle to reduce recurrence of condition will improve Ability to verbalize feelings will improve Ability to demonstrate self-control will improve Compliance with prescribed medications will improve Ability to identify triggers associated with substance abuse/mental health issues will improve  Medication Management: Evaluate patient's response, side effects, and tolerance of medication regimen.  Therapeutic Interventions: 1 to 1 sessions, Unit Group sessions and Medication administration.  Evaluation of Outcomes: Adequate for Discharge  Physician Treatment Plan for Secondary Diagnosis: Principal Problem:   Bipolar disorder, most recent episode depressed (HCC) Active Problems:   Major depression, recurrent (HCC)  Long Term Goal(s): Improvement in symptoms so as ready for discharge Improvement in symptoms so as ready for discharge   Short Term Goals: Ability to identify changes in lifestyle to reduce recurrence of condition will  improve Ability to verbalize feelings will improve Ability to disclose and discuss suicidal ideas Ability to identify changes in lifestyle to reduce recurrence of condition will improve Ability to verbalize feelings will improve Ability to demonstrate self-control will improve Compliance with prescribed medications will improve Ability to identify triggers associated with substance abuse/mental health issues will improve     Medication Management: Evaluate patient's response, side effects, and tolerance of medication regimen.  Therapeutic Interventions: 1 to 1 sessions, Unit Group sessions and Medication administration.  Evaluation of Outcomes: Adequate for Discharge   RN Treatment Plan for Primary Diagnosis: Bipolar disorder, most recent episode depressed (HCC) Long Term Goal(s): Knowledge of disease and therapeutic regimen to maintain health will improve  Short Term Goals: Ability to remain free from injury will improve, Ability to disclose and discuss suicidal ideas, Ability to identify and develop effective coping behaviors will improve and  Compliance with prescribed medications will improve  Medication Management: RN will administer medications as ordered by provider, will assess and evaluate patient's response and provide education to patient for prescribed medication. RN will report any adverse and/or side effects to prescribing provider.  Therapeutic Interventions: 1 on 1 counseling sessions, Psychoeducation, Medication administration, Evaluate responses to treatment, Monitor vital signs and CBGs as ordered, Perform/monitor CIWA, COWS, AIMS and Fall Risk screenings as ordered, Perform wound care treatments as ordered.  Evaluation of Outcomes: Adequate for Discharge   LCSW Treatment Plan for Primary Diagnosis: Bipolar disorder, most recent episode depressed (HCC) Long Term Goal(s): Safe transition to appropriate next level of care at discharge, Engage patient in therapeutic group  addressing interpersonal concerns.  Short Term Goals: Engage patient in aftercare planning with referrals and resources, Increase social support, Increase emotional regulation, Identify triggers associated with mental health/substance abuse issues and Increase skills for wellness and recovery  Therapeutic Interventions: Assess for all discharge needs, 1 to 1 time with Social worker, Explore available resources and support systems, Assess for adequacy in community support network, Educate family and significant other(s) on suicide prevention, Complete Psychosocial Assessment, Interpersonal group therapy.  Evaluation of Outcomes: Adequate for Discharge   Progress in Treatment :  Attending groups: Yes Participating in groups: Yes Taking medication as prescribed: Yes, MD continuing to assess for appropriate medication regimen Toleration medication: Yes Family/Significant other contact made: No, Pt declines Patient understands diagnosis: Yes Discussing patient identified problems/goals with staff: Yes Medical problems stabilized or resolved: Yes Denies suicidal/homicidal ideation: Yes Issues/concerns per patient self-inventory: None reported Other: N/A  New problem(s) identified: None reported at this time  New Short Term/Long Term Goal(s): None at this time  Discharge Plan or Barriers: None identified at this time.   12/12/16: Pt has been referred to [facility] and nurse is reviewing referral. Phone interview completed on 1/4.  12/15/16: Pt has bed at [facility] today for continued treatment.  Reason for Continuation of Hospitalization: Anxiety Depression Medication stabilization Withdrawal symptoms  Estimated Length of Stay: 0 days    Attendees:  Patient:  Physician: Dr. Jama Flavors Nursing: Liborio Nixon, RN; Marzetta Board, RN  12/15/2016 9:32 AM  RN Care Manager:  Social Workers: Vernie Shanks,   12/15/2016 9:32 AM  Nurse Pratictioners Hillery Jacks, Gray Bernhardt, NP 12/12/2016  9:30am Other:     Scribe for Treatment Team: Vernie Shanks, LCSW Clinical Social Work [phone]
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
## 2016-12-15 NOTE — Progress Notes (Signed)
D: Pt at the time of assessment was alert and oriented x4. Pt continue to endorse moderate anxiety and depression; state, "I don't feel good." Pt was however, seen interacting with peers appropriately. Pt remained calm and cooperative. A: Medications offered as prescribed.  Support, encouragement, and safe environment provided.  15-minute safety checks continue. R: Pt was med compliant.  Pt attended group. Safety checks continue

## 2016-12-15 NOTE — BHH Suicide Risk Assessment (Signed)
[facility]   Principal Problem: Bipolar disorder, most recent episode depressed [facility]) Discharge Diagnoses:  Patient Active Problem List   Diagnosis Date Noted  . Major depression, recurrent (HCC) [F33.9] 11/27/2016  . Bipolar disorder, most recent episode depressed (HCC) [F31.30] 10/12/2016  . Anemia, iron deficiency [D50.9] 10/12/2016  . Alcohol use disorder, moderate, dependence (HCC) [F10.20] 10/12/2016  . Lactose intolerance [E73.9] 10/12/2016    Total Time spent with patient: 30 minutes  Musculoskeletal: Strength & Muscle Tone: within normal limits Gait & Station: normal Patient leans: N/A  Psychiatric Specialty Exam: ROS denies headache, no shortness of breath, no chest pain, no vomiting   Blood pressure 112/69, pulse 92, temperature 98.6 F (37 C), temperature source Oral, resp. rate 18, height 5\' 2"  (1.575 m), weight 56.7 kg (125 lb), SpO2 100 %.Body mass index is 22.86 kg/m.  General Appearance: improved mood   Eye Contact::  Good  Speech:  Normal Rate409  Volume:  Normal  Mood:  reports partial improvement of mood  Affect:  appropriate, more reactive   Thought Process:  Linear  Orientation:  Full (Time, Place, and Person)  Thought Content:  no hallucinations, no delusions, not internally preoccupied   Suicidal Thoughts:  No denies any suicidal or self injurious ideations, denies any homicidal or violent ideations   Homicidal Thoughts:  No  Memory:  recent and remote grossly intact   Judgement:  Other:  improving   Insight:  improving   Psychomotor Activity:  Normal  Concentration:  Good  Recall:  Good  Fund of Knowledge:Good  Language: Good  Akathisia:  NA  Handed:  Right  AIMS (if indicated):     Assets:  Desire for Improvement Resilience  Sleep:  Number of Hours: 6  Cognition: WNL  ADL's:  Intact   Mental Status Per Nursing Assessment::   On Admission:  Self-harm thoughts  Demographic Factors:  41 year old female , lives  alone, has two children  Loss Factors: 42 year old son has Asperger's Disorder, has history of being aggressive, is now living with foster parents, patient's 42 year old daughter moved out of the house   Historical Factors: History of depression, history of alcohol abuse, history of prior psychiatric admission  Risk Reduction Factors:   Responsible for children under 42 years of age, Sense of responsibility to family and Positive coping skills or problem solving skills  Continued Clinical Symptoms:  At this time patient is alert, attentive , better related, no psychomotor agitation or retardation, mood is partially improved compared to admission, affect is appropriate, vaguely anxious, constricted, but more reactive than on admission, no thought disorder , no suicidal or self injurious ideations at this time, no hallucinations, no delusions, not internally preoccupied. Denies medication side effects.  Cognitive Features That Contribute To Risk:  No gross cognitive deficits noted upon discharge. Is alert , attentive, and oriented x 3   Suicide Risk:  Mild:  Suicidal ideation of limited frequency, intensity, duration, and specificity.  There are no identifiable plans, no associated intent, mild dysphoria and related symptoms, good self-control (both objective and subjective assessment), few other risk factors, and identifiable protective factors, including available and accessible social support.    Plan Of Care/Follow-up recommendations:  Activity:  as tolerated  Diet:  Regular Tests:  NA Other:  see below   Patient is discharging to [facility], and follows up at [facility] for outpatient psychiatric management   Nehemiah MassedOBOS, FERNANDO, MD 12/15/2016, 9:19 AM

## 2016-12-15 NOTE — Progress Notes (Signed)
  [facility]  Will you be returning to the same living situation after discharge:  No. [facility]  At discharge, do you have transportation home?: Yes,  [facility] staff to provide transportation Do you have the ability to pay for your medications: Yes,  Pt provided with supply of  medication for continued treatment  Release of information consent forms completed and in the chart;  Patient's signature needed at discharge.  Patient to Follow up at: Follow-up Information    [facility] Follow up on 12/15/2016.   Why:  Please go today for continued residential treatment.  Contact information: [address] [place], [place]  [phone]          Next level of care provider has [facility]:yes  Safety Planning and Suicide Prevention discussed: Yes,  with Pt; declines family contact  Have you used any form of tobacco in the last 30 days? (Cigarettes, Smokeless Tobacco, Cigars, and/or Pipes): Yes  Has patient been referred to the Quitline?: Patient refused referral  Patient has been referred for addiction treatment: Yes  Verdene LennertLauren C Tobi Groesbeck 12/15/2016, 9:45 AM

## 2016-12-15 NOTE — Progress Notes (Signed)
Discharge note: Pt received both written and verbal discharge instructions. Pt verbalized understanding of discharge instructions. Pt agreed to f/u appt and med regimen. Pt received sample meds, prescriptions, SRA, AVS, Transition record and belongings from room and locker. Pt safely discharged to the lobby and picked up by [facility].

## 2016-12-16 NOTE — Progress Notes (Signed)
Patient ID: Leslie Sanders, female   DOB: 07/09/1976, 41 y.o.   MRN: [id number] PER STATE REGULATIONS 482.30  THIS CHART WAS REVIEWED FOR MEDICAL NECESSITY WITH RESPECT TO THE PATIENT'S ADMISSION/ DURATION OF STAY.  NEXT REVIEW DATE: 12/17/2016  Willa RoughJENNIFER JONES Arafat Cocuzza, RN, BSN CASE MANAGER

## 2017-11-07 HISTORY — PX: HYSTERECTOMY ABDOMINAL WITH SALPINGECTOMY: SHX6725

## 2017-12-14 DIAGNOSIS — F410 Panic disorder [episodic paroxysmal anxiety] without agoraphobia: Secondary | ICD-10-CM | POA: Insufficient documentation

## 2018-04-28 DIAGNOSIS — G8929 Other chronic pain: Secondary | ICD-10-CM

## 2018-04-28 HISTORY — DX: Other chronic pain: G89.29

## 2019-03-24 DIAGNOSIS — M797 Fibromyalgia: Secondary | ICD-10-CM | POA: Insufficient documentation

## 2019-05-13 HISTORY — PX: OTHER SURGICAL HISTORY: SHX169

## 2019-05-23 HISTORY — PX: SPINAL FUSION: SHX223

## 2019-05-23 HISTORY — PX: POSTERIOR CERVICAL LAMINECTOMY: SHX2248

## 2019-06-26 ENCOUNTER — Emergency Department (HOSPITAL_COMMUNITY)
Admission: EM | Admit: 2019-06-26 | Discharge: 2019-06-26 | Disposition: A | Discharge status: Home / Self Care | Payer: Medicare Other | Attending: Emergency Medicine | Admitting: Emergency Medicine

## 2019-06-26 ENCOUNTER — Other Ambulatory Visit: Payer: Self-pay

## 2019-06-26 ENCOUNTER — Encounter (HOSPITAL_COMMUNITY): Payer: Self-pay | Admitting: Emergency Medicine

## 2019-06-26 DIAGNOSIS — L02214 Cutaneous abscess of groin: Secondary | ICD-10-CM | POA: Diagnosis not present

## 2019-06-26 DIAGNOSIS — N764 Abscess of vulva: Secondary | ICD-10-CM

## 2019-06-26 DIAGNOSIS — Z79899 Other long term (current) drug therapy: Secondary | ICD-10-CM | POA: Insufficient documentation

## 2019-06-26 DIAGNOSIS — F17210 Nicotine dependence, cigarettes, uncomplicated: Secondary | ICD-10-CM | POA: Diagnosis not present

## 2019-06-26 MED ORDER — LIDOCAINE HCL 1 % IJ SOLN
INTRAMUSCULAR | Status: AC
  Filled 2019-06-26: qty 20

## 2019-06-26 MED ORDER — LIDOCAINE HCL (PF) 1 % IJ SOLN: 5.0000 mL | Freq: Once | INTRAMUSCULAR | Status: DC

## 2019-06-26 MED ORDER — SULFAMETHOXAZOLE-TRIMETHOPRIM 800-160 MG PO TABS: 1.0000 | ORAL_TABLET | Freq: Two times a day (BID) | ORAL | 0 refills | Status: AC

## 2019-06-26 NOTE — ED Triage Notes (Signed)
Pt presents by [facility] from [facility] due to abscess to vaginal area. Pt is currently on Keflex for infection.
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
## 2019-06-26 NOTE — Discharge Instructions (Signed)
Continue Keflex as previously prescribed.  Begin taking Bactrim as prescribed this evening.  Apply warm compresses as frequently as possible for the next several days.  Follow-up with your primary doctor if not improving in the next few days, and return to the ER if symptoms significantly worsen or change.

## 2019-06-26 NOTE — ED Notes (Signed)
[facility] called for transport. [facility] called and report was given.

## 2019-07-07 ENCOUNTER — Other Ambulatory Visit: Payer: Self-pay | Admitting: Internal Medicine

## 2019-07-07 DIAGNOSIS — R413 Other amnesia: Secondary | ICD-10-CM

## 2019-07-07 DIAGNOSIS — R42 Dizziness and giddiness: Secondary | ICD-10-CM

## 2019-07-15 ENCOUNTER — Other Ambulatory Visit: Payer: Self-pay

## 2019-07-15 ENCOUNTER — Ambulatory Visit
Admission: RE | Admit: 2019-07-15 | Discharge: 2019-07-15 | Disposition: A | Discharge status: Home / Self Care | Payer: Medicare Other | Source: Ambulatory Visit | Attending: Internal Medicine | Admitting: Internal Medicine

## 2019-07-15 DIAGNOSIS — R413 Other amnesia: Secondary | ICD-10-CM

## 2019-07-15 DIAGNOSIS — R42 Dizziness and giddiness: Secondary | ICD-10-CM

## 2019-07-19 ENCOUNTER — Ambulatory Visit: Payer: Medicare Other | Admitting: Cardiology

## 2019-07-19 ENCOUNTER — Ambulatory Visit: Payer: Medicare Other | Admitting: Internal Medicine

## 2019-07-19 DIAGNOSIS — R9431 Abnormal electrocardiogram [ECG] [EKG]: Secondary | ICD-10-CM | POA: Insufficient documentation

## 2019-07-19 NOTE — Progress Notes (Deleted)
Cardiology Office Note:    Date:  07/19/2019   ID:  Leslie Sanders, DOB [birth year], MRN [id number]  PCP:  Lynn ItoSmith, Lori, MD  Cardiologist:  Norman HerrlichBrian Munley, MD   Referring MD: Lynn ItoSmith, Lori, MD  ASSESSMENT:    No diagnosis found. PLAN:    In order of problems listed above:  1. ***  Next appointment   Medication Adjustments/Labs and Tests Ordered: Current medicines are reviewed at length with the patient today.  Concerns regarding medicines are outlined above.  No orders of the defined types were placed in this encounter.  No orders of the defined types were placed in this encounter.    No chief complaint on file. ***  History of Present Illness:    Leslie Sanders is a 43 y.o. female who is being seen today for the evaluation of an abnormal EKG consider ASMI at the request of Lynn ItoSmith, Lori, MD.her mobile echocardiogram showed normal LV function.   Past Medical History:  Diagnosis Date  . Depression     Past Surgical History:  Procedure Laterality Date  . ABDOMINAL HYSTERECTOMY    . BREAST REDUCTION SURGERY    . CHOLECYSTECTOMY    . SPINAL FUSION  05/23/2019    Current Medications: No outpatient medications have been marked as taking for the 07/19/19 encounter (Appointment) with Baldo DaubMunley, Brian J, MD.     Allergies:   Tylenol [acetaminophen], Hydrocodone-acetaminophen, and Latex   Social History   Socioeconomic History  . Marital status: Divorced    Spouse name: Not on file  . Number of children: Not on file  . Years of education: Not on file  . Highest education level: Not on file  Occupational History  . Not on file  Social Needs  . Financial resource strain: Not on file  . Food insecurity    Worry: Not on file    Inability: Not on file  . Transportation needs    Medical: Not on file    Non-medical: Not on file  Tobacco Use  . Smoking status: Current Every Day Smoker    Packs/day: 0.50    Types: Cigarettes  . Smokeless tobacco: Never Used  Substance  and Sexual Activity  . Alcohol use: Yes    Alcohol/week: 28.0 standard drinks    Types: 28 Cans of beer per week  . Drug use: No  . Sexual activity: Not Currently  Lifestyle  . Physical activity    Days per week: Not on file    Minutes per session: Not on file  . Stress: Not on file  Relationships  . Social Musicianconnections    Talks on phone: Not on file    Gets together: Not on file    Attends religious service: Not on file    Active member of club or organization: Not on file    Attends meetings of clubs or organizations: Not on file    Relationship status: Not on file  Other Topics Concern  . Not on file  Social History Narrative  . Not on file     Family History: The patient's ***family history includes ADD / ADHD in her son; ODD in her son; Schizophrenia in her maternal aunt.  ROS:   ROS Please see the history of present illness.    *** All other systems reviewed and are negative.  EKGs/Labs/Other Studies Reviewed:    The following studies were reviewed today: ***  EKG:  EKG is *** ordered today.  The ekg ordered today is personally  reviewed and demonstrates ***  Recent Labs: No results found for requested labs within last 8760 hours.  Recent Lipid Panel    Component Value Date/Time   CHOL 173 11/29/2016 0644   TRIG 84 11/29/2016 0644   HDL 88 11/29/2016 0644   CHOLHDL 2.0 11/29/2016 0644   VLDL 17 11/29/2016 0644   LDLCALC 68 11/29/2016 0644    Physical Exam:    VS:  LMP 10/07/2016     Wt Readings from Last 3 Encounters:  06/26/19 132 lb (59.9 kg)  01/13/15 130 lb (59 kg)     GEN: *** Well nourished, well developed in no acute distress HEENT: Normal NECK: No JVD; No carotid bruits LYMPHATICS: No lymphadenopathy CARDIAC: ***RRR, no murmurs, rubs, gallops RESPIRATORY:  Clear to auscultation without rales, wheezing or rhonchi  ABDOMEN: Soft, non-tender, non-distended MUSCULOSKELETAL:  No edema; No deformity  SKIN: Warm and dry NEUROLOGIC:  Alert and  oriented x 3 PSYCHIATRIC:  Normal affect     Signed, Shirlee More, MD  07/19/2019 8:06 AM    [place]

## 2019-08-02 ENCOUNTER — Ambulatory Visit (INDEPENDENT_AMBULATORY_CARE_PROVIDER_SITE_OTHER): Payer: Medicare Other | Admitting: Cardiology

## 2019-08-02 ENCOUNTER — Encounter: Payer: Self-pay | Admitting: Cardiology

## 2019-08-02 ENCOUNTER — Other Ambulatory Visit: Payer: Self-pay

## 2019-08-02 DIAGNOSIS — Z87891 Personal history of nicotine dependence: Secondary | ICD-10-CM

## 2019-08-02 DIAGNOSIS — R0789 Other chest pain: Secondary | ICD-10-CM | POA: Diagnosis not present

## 2019-08-02 DIAGNOSIS — R0609 Other forms of dyspnea: Secondary | ICD-10-CM | POA: Diagnosis not present

## 2019-08-02 DIAGNOSIS — R0600 Dyspnea, unspecified: Secondary | ICD-10-CM | POA: Insufficient documentation

## 2019-08-02 NOTE — Patient Instructions (Addendum)
Medication Instructions:  Your physician recommends that you continue on your current medications as directed. Please refer to the Current Medication list given to you today.  If you need a refill on your cardiac medications before your next appointment, please call your pharmacy.   Lab work: None.  If you have labs (blood work) drawn today and your tests are completely normal, you will receive your results only by: Marland Kitchen MyChart Message (if you have MyChart) OR . A paper copy in the mail If you have any lab test that is abnormal or we need to change your treatment, we will call you to review the results.  Testing/Procedures: Your physician has requested that you have a lexiscan myoview. For further information please visit [email or web address]. Please follow instruction sheet, as given.      Follow-Up: At [facility], you and your health needs are our priority.  As part of our continuing mission to provide you with exceptional heart care, we have created designated Provider Care Teams.  These Care Teams include your primary Cardiologist (physician) and Advanced Practice Providers (APPs -  Physician Assistants and Nurse Practitioners) who all work together to provide you with the care you need, when you need it. You will need a follow up appointment in 1 month.  Please call our office 2 months in advance to schedule this appointment.  You may see No primary care provider on file. or another member of our [employer] Provider Team in [place]: Shirlee More, MD . Jyl Heinz, MD  Any Other Special Instructions Will Be Listed Below (If Applicable).      Cardiac Nuclear Scan A cardiac nuclear scan is a test that measures blood flow to the heart when a person is resting and when he or she is exercising. The test looks for problems such as:  Not enough blood reaching a portion of the heart.  The heart muscle not working normally. You may need this test if:  You have heart  disease.  You have had abnormal lab results.  You have had heart surgery or a balloon procedure to open up blocked arteries (angioplasty).  You have chest pain.  You have shortness of breath. In this test, a radioactive dye (tracer) is injected into your bloodstream. After the tracer has traveled to your heart, an imaging device is used to measure how much of the tracer is absorbed by or distributed to various areas of your heart. This procedure is usually done at a hospital and takes 2-4 hours. Tell a health care provider about:  Any allergies you have.  All medicines you are taking, including vitamins, herbs, eye drops, creams, and over-the-counter medicines.  Any problems you or family members have had with anesthetic medicines.  Any blood disorders you have.  Any surgeries you have had.  Any medical conditions you have.  Whether you are pregnant or may be pregnant. What are the risks? Generally, this is a safe procedure. However, problems may occur, including:  Serious chest pain and heart attack. This is only a risk if the stress portion of the test is done.  Rapid heartbeat.  Sensation of warmth in your chest. This usually passes quickly.  Allergic reaction to the tracer. What happens before the procedure?  Ask your health care provider about changing or stopping your regular medicines. This is especially important if you are taking diabetes medicines or blood thinners.  Follow instructions from your health care provider about eating or drinking restrictions.  Remove your jewelry  on the day of the procedure. What happens during the procedure?  An IV will be inserted into one of your veins.  Your health care provider will inject a small amount of radioactive tracer through the IV.  You will wait for 20-40 minutes while the tracer travels through your bloodstream.  Your heart activity will be monitored with an electrocardiogram (ECG).  You will lie down on an  exam table.  Images of your heart will be taken for about 15-20 minutes.  You may also have a stress test. For this test, one of the following may be done: ? You will exercise on a treadmill or stationary bike. While you exercise, your heart's activity will be monitored with an ECG, and your blood pressure will be checked. ? You will be given medicines that will increase blood flow to parts of your heart. This is done if you are unable to exercise.  When blood flow to your heart has peaked, a tracer will again be injected through the IV.  After 20-40 minutes, you will get back on the exam table and have more images taken of your heart.  Depending on the type of tracer used, scans may need to be repeated 3-4 hours later.  Your IV line will be removed when the procedure is over. The procedure may vary among health care providers and hospitals. What happens after the procedure?  Unless your health care provider tells you otherwise, you may return to your normal schedule, including diet, activities, and medicines.  Unless your health care provider tells you otherwise, you may increase your fluid intake. This will help to flush the contrast dye from your body. Drink enough fluid to keep your urine pale yellow.  Ask your health care provider, or the department that is doing the test: ? When will my results be ready? ? How will I get my results? Summary  A cardiac nuclear scan measures the blood flow to the heart when a person is resting and when he or she is exercising.  Tell your health care provider if you are pregnant.  Before the procedure, ask your health care provider about changing or stopping your regular medicines. This is especially important if you are taking diabetes medicines or blood thinners.  After the procedure, unless your health care provider tells you otherwise, increase your fluid intake. This will help flush the contrast dye from your body.  After the procedure,  unless your health care provider tells you otherwise, you may return to your normal schedule, including diet, activities, and medicines. This information is not intended to replace advice given to you by your health care provider. Make sure you discuss any questions you have with your health care provider. Document Released: 12/19/2004 Document Revised: 05/10/2018 Document Reviewed: 05/10/2018 Elsevier Patient Education  2020 [employer]

## 2019-08-02 NOTE — Addendum Note (Signed)
Addended by: Ashok Norris on: 08/02/2019 10:40 AM   Modules accepted: Orders

## 2019-08-02 NOTE — Progress Notes (Signed)
Cardiology Consultation:    Date:  08/02/2019   ID:  Leslie Sanders, DOB May 13, 1976, MRN [id number]  PCP:  Lynn ItoSmith, Lori, MD  Cardiologist:  Gypsy Balsamobert Kazuma Elena, MD   Referring MD: Lynn ItoSmith, Lori, MD   Chief Complaint  Patient presents with   Abnormal ECG   Tachycardia   Shortness of Breath   Leg Swelling    History of Present Illness:    Leslie Sanders is a 43 y.o. female who is being seen today for the evaluation of shortness of breath at the request of Lynn ItoSmith, Lori, MD.  There is a little confusion why she is here today.  She was told to have some abnormal test however EKG that I did today looks normal her echocardiogram showed no significant lesions.  It looks like leading complaint is shortness of breath.  Few months ago she got some repeated is cervical spinal fusion done and since that time she is been complaining of having some shortness of breath.  Shortness of breath happen when she sits shortness of breath happening when she is doing some simple chores at home.  There is no chest pain tightness squeezing pressure burning in the chest associated with this sensation.  She also complained of having some independent sensation in the left arm tingling and pain.  She does have fibromyalgia also some depression as well as anxiety.  She walks with a walker because she fell down multiple times because of loss of balance.  Echocardiogram was done which I reviewed was done 2 months ago.  Showed normal heart.  EKG today is normal. She quit smoking 8 months ago.  She complained about the fact that she gained significant amount of weight since that time.  She used to drink alcohol however does not do it anymore. Does not have family history for premature coronary disease She is not sure about her cholesterol.  Past Medical History:  Diagnosis Date   Depression     Past Surgical History:  Procedure Laterality Date   ABDOMINAL HYSTERECTOMY     BREAST REDUCTION SURGERY     CHOLECYSTECTOMY      HAND SURGERY     SPINAL FUSION  05/23/2019    Current Medications: Current Meds  Medication Sig   cyanocobalamin (,VITAMIN B-12,) 1000 MCG/ML injection Inject 1 mL into the muscle every 30 (thirty) days.   docusate sodium (COLACE) 100 MG capsule Take 1 capsule (100 mg total) by mouth 2 (two) times daily as needed for mild constipation or moderate constipation.   DULoxetine (CYMBALTA) 60 MG capsule Take 1 capsule by mouth daily.   ferrous sulfate 325 (65 FE) MG tablet Take 1 tablet (325 mg total) by mouth 2 (two) times daily with a meal.   folic acid (FOLVITE) 1 MG tablet Take 1 tablet by mouth daily.   furosemide (LASIX) 20 MG tablet Take 1 tablet by mouth daily.   hydrOXYzine (ATARAX/VISTARIL) 50 MG tablet Take 1 tablet (50 mg total) by mouth at bedtime as needed (for sleep).   levalbuterol (XOPENEX HFA) 45 MCG/ACT inhaler Inhale 2 puffs into the lungs every 6 (six) hours as needed.   lidocaine (LIDODERM) 5 % Place 1 patch onto the skin.   lidocaine (XYLOCAINE) 5 % ointment Apply topically as needed.   montelukast (SINGULAIR) 10 MG tablet Take 10 mg by mouth at bedtime.   pregabalin (LYRICA) 100 MG capsule Take 1 capsule by mouth daily.   traZODone (DESYREL) 50 MG tablet Take 1 tablet by mouth  as needed.   Vitamin D, Ergocalciferol, (DRISDOL) 1.25 MG (50000 UT) CAPS capsule Take 1 capsule by mouth once a week.     Allergies:   Tylenol [acetaminophen], Hydrocodone-acetaminophen, Nsaids, and Latex   Social History   Socioeconomic History   Marital status: Divorced    Spouse name: Not on file   Number of children: Not on file   Years of education: Not on file   Highest education level: Not on file  Occupational History   Not on file  Social Needs   Financial resource strain: Not on file   Food insecurity    Worry: Not on file    Inability: Not on file   Transportation needs    Medical: Not on file    Non-medical: Not on file  Tobacco Use    Smoking status: Former Smoker    Packs/day: 0.50    Types: Cigarettes   Smokeless tobacco: Never Used  Substance and Sexual Activity   Alcohol use: Not Currently    Alcohol/week: 28.0 standard drinks    Types: 28 Cans of beer per week    Comment: Nothing in 4 years   Drug use: No   Sexual activity: Not Currently  Lifestyle   Physical activity    Days per week: Not on file    Minutes per session: Not on file   Stress: Not on file  Relationships   Social connections    Talks on phone: Not on file    Gets together: Not on file    Attends religious service: Not on file    Active member of club or organization: Not on file    Attends meetings of clubs or organizations: Not on file    Relationship status: Not on file  Other Topics Concern   Not on file  Social History Narrative   Not on file     Family History: The patient's family history includes ADD / ADHD in her son; Healthy in her father and mother; ODD in her son; Schizophrenia in her maternal aunt. ROS:   Please see the history of present illness.    All 14 point review of systems negative except as described per history of present illness.  EKGs/Labs/Other Studies Reviewed:    The following studies were reviewed today:   EKG:  EKG is  ordered today.  The ekg ordered today demonstrates normal sinus rhythm, normal P interval, normal QS complex duration morphology no ST-T segment changes  Recent Labs: No results found for requested labs within last 8760 hours.  Recent Lipid Panel    Component Value Date/Time   CHOL 173 11/29/2016 0644   TRIG 84 11/29/2016 0644   HDL 88 11/29/2016 0644   CHOLHDL 2.0 11/29/2016 0644   VLDL 17 11/29/2016 0644   LDLCALC 68 11/29/2016 0644    Physical Exam:    VS:  BP 126/70    Pulse 76    Ht 5\' 2"  (1.575 m)    Wt 130 lb 1.9 oz (59 kg)    LMP 10/07/2016    SpO2 96%    BMI 23.80 kg/m     Wt Readings from Last 3 Encounters:  08/02/19 130 lb 1.9 oz (59 kg)  06/26/19 132  lb (59.9 kg)  01/13/15 130 lb (59 kg)     GEN:  Well nourished, well developed in no acute distress HEENT: Normal NECK: No JVD; No carotid bruits LYMPHATICS: No lymphadenopathy CARDIAC: RRR, no murmurs, no rubs, no gallops RESPIRATORY:  Clear  to auscultation without rales, wheezing or rhonchi  ABDOMEN: Soft, non-tender, non-distended MUSCULOSKELETAL:  No edema; No deformity  SKIN: Warm and dry NEUROLOGIC:  Alert and oriented x 3 PSYCHIATRIC:  Normal affect   ASSESSMENT:    1. Atypical chest pain   2. History of smoking   3. Dyspnea on exertion    PLAN:    In order of problems listed above:  1. Lady with atypical chest pain with history of smoking for 20 years likely 8 months ago she quit.  Her cholesterol was fine in 2017.  She does have recently diagnosed hypertension, no diabetes.  Overall I think we need to evaluate her for potentially having coronary artery disease and I think the best test for her will be simply to do calcium score.  If calcium score is negative we can drop the issue if positive then future evaluation need to be done. 2. He still smoking I congratulated her on quitting and encouraged her to stay away from it. 3. Dyspnea on exertion recent echocardiogram was normal ejection fraction.  Calcium score will be done to rule out potentially coronary artery disease which I doubt that she has.  If those tests will be negative we will may need to investigate her lungs.  She does have 20 years of smoking.  She may require it pulmonary function test.   Medication Adjustments/Labs and Tests Ordered: Current medicines are reviewed at length with the patient today.  Concerns regarding medicines are outlined above.  No orders of the defined types were placed in this encounter.  No orders of the defined types were placed in this encounter.   Signed, Georgeanna Lea, MD, [facility]. 08/02/2019 10:33 AM    [facility]

## 2019-08-02 NOTE — Addendum Note (Signed)
Addended by: Ashok Norris on: 08/02/2019 10:50 AM   Modules accepted: Orders

## 2019-08-03 ENCOUNTER — Telehealth (HOSPITAL_COMMUNITY): Payer: Self-pay | Admitting: *Deleted

## 2019-08-03 ENCOUNTER — Ambulatory Visit: Payer: Medicare Other | Admitting: Cardiology

## 2019-08-03 NOTE — Telephone Encounter (Signed)
Patient given detailed instructions per Myocardial Perfusion Study Information Sheet for the test on 08/05/19 at 7:45. Patient notified to arrive 15 minutes early and that it is imperative to arrive on time for appointment to keep from having the test rescheduled.  If you need to cancel or reschedule your appointment, please call the office within 24 hours of your appointment. . Patient verbalized understanding.Leslie Sanders

## 2019-08-05 ENCOUNTER — Other Ambulatory Visit: Payer: Self-pay

## 2019-08-05 ENCOUNTER — Ambulatory Visit (HOSPITAL_COMMUNITY): Payer: Medicare Other | Attending: Cardiology

## 2019-08-05 VITALS — Ht 62.0 in | Wt 130.0 lb

## 2019-08-05 DIAGNOSIS — R9431 Abnormal electrocardiogram [ECG] [EKG]: Secondary | ICD-10-CM | POA: Diagnosis present

## 2019-08-05 DIAGNOSIS — R0789 Other chest pain: Secondary | ICD-10-CM | POA: Insufficient documentation

## 2019-08-05 LAB — MYOCARDIAL PERFUSION IMAGING
LV dias vol: 63 mL (ref 46–106)
LV sys vol: 23 mL
Peak HR: 117 {beats}/min
Rest HR: 66 {beats}/min
SDS: 4
SRS: 0
SSS: 4
TID: 0.99

## 2019-08-05 MED ORDER — TECHNETIUM TC 99M TETROFOSMIN IV KIT
32.9000 | PACK | Freq: Once | INTRAVENOUS | Status: AC | PRN
  Administered 2019-08-05: 32.9 via INTRAVENOUS
  Filled 2019-08-05: qty 33

## 2019-08-05 MED ORDER — REGADENOSON 0.4 MG/5ML IV SOLN
0.4000 mg | Freq: Once | INTRAVENOUS | Status: AC
  Administered 2019-08-05: 0.4 mg via INTRAVENOUS

## 2019-08-05 MED ORDER — TECHNETIUM TC 99M TETROFOSMIN IV KIT
10.7000 | PACK | Freq: Once | INTRAVENOUS | Status: AC | PRN
  Administered 2019-08-05: 10.7 via INTRAVENOUS
  Filled 2019-08-05: qty 11

## 2019-08-09 ENCOUNTER — Ambulatory Visit: Payer: Medicare Other | Admitting: Cardiology

## 2019-08-25 ENCOUNTER — Telehealth: Payer: Self-pay | Admitting: Cardiology

## 2019-08-25 DIAGNOSIS — R0609 Other forms of dyspnea: Secondary | ICD-10-CM

## 2019-08-25 NOTE — Telephone Encounter (Signed)
Left message for patient to return call.

## 2019-08-25 NOTE — Telephone Encounter (Signed)
Patient called back and reports that Dr. Agustin Cree discussed doing a pulmonologist referral if her stress test was normal. Stress test was normal so she wants [place] to place referral now. Will consult with Dr. Agustin Cree.

## 2019-08-25 NOTE — Telephone Encounter (Signed)
Patient is calling to request a referral to a pulmonologist

## 2019-08-30 ENCOUNTER — Encounter: Payer: Self-pay | Admitting: Family

## 2019-08-30 ENCOUNTER — Ambulatory Visit (INDEPENDENT_AMBULATORY_CARE_PROVIDER_SITE_OTHER): Payer: Medicare Other | Admitting: Family

## 2019-08-30 ENCOUNTER — Other Ambulatory Visit: Payer: Self-pay

## 2019-08-30 VITALS — BP 112/62 | HR 84 | Temp 97.7°F | Resp 18 | Ht 62.0 in | Wt 132.5 lb

## 2019-08-30 DIAGNOSIS — D500 Iron deficiency anemia secondary to blood loss (chronic): Secondary | ICD-10-CM

## 2019-08-30 DIAGNOSIS — R600 Localized edema: Secondary | ICD-10-CM

## 2019-08-30 DIAGNOSIS — Z9189 Other specified personal risk factors, not elsewhere classified: Secondary | ICD-10-CM | POA: Diagnosis not present

## 2019-08-30 DIAGNOSIS — R0602 Shortness of breath: Secondary | ICD-10-CM | POA: Diagnosis not present

## 2019-08-30 DIAGNOSIS — Z87891 Personal history of nicotine dependence: Secondary | ICD-10-CM

## 2019-08-30 NOTE — Progress Notes (Signed)
Office Visit    Patient Name: Leslie Sanders Date of Encounter: 08/30/2019  Primary Care Provider:  Lynn Ito, MD Primary Cardiologist:  Gypsy Balsam, MD Electrophysiologist:  None   Chief Complaint    Leslie Sanders is a 43 y.o. female with a hx of chest pain, SOB, HTN presents today for follow up after Lexiscan   Past Medical History    Past Medical History:  Diagnosis Date  . Anemia    (a) chronic blood loss hx of menorrhagia s/p endometrial biopsy 04/2015 (b) iron deficient  . Anxiety   . B12 deficiency   . Bipolar 1 disorder (HCC)   . Cervical spinal stenosis   . Cervical spondylosis without myelopathy 07/08/2016  . Chronic pain of right thumb 04/28/2018  . Depression   . GERD (gastroesophageal reflux disease)   . Hemorrhoids    Pt with constipation secondary to iron supplement. Pt has had rectal bleed secondary to internal hemorrhoids. Pt is s/p EGD/colonoscopy 05/2012  . 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 Migraine   . PONV (postoperative nausea and vomiting)   . PTSD (post-traumatic stress disorder)   . Seasonal allergies   . Vitamin E deficiency   . Zinc deficiency    Past Surgical History:  Procedure Laterality Date  . ABDOMINAL HYSTERECTOMY    . BREAST LUMPECTOMY Bilateral   . BREAST REDUCTION SURGERY    . Carpal Metacarpal Arthroplasty Right 05/13/2019  . CHOLECYSTECTOMY  1990  . HAND SURGERY    . HYSTERECTOMY ABDOMINAL WITH SALPINGECTOMY  11/2017  . POSTERIOR CERVICAL LAMINECTOMY Bilateral 05/23/2019  . ROUX-EN-Y GASTRIC BYPASS  2003   in [place]  . SPINAL FUSION  05/23/2019    Allergies  Allergies  Allergen Reactions  . Tylenol [Acetaminophen] Anaphylaxis    Per patient  . Hydrocodone-Acetaminophen Other (See Comments) and Nausea And Vomiting  . Nsaids Other (See Comments)    S/p bariatric surgery; increased risk of ulcers, gastritis and cancer  . Latex Rash    History of Present Illness    Leslie Sanders is a 43 y.o. female  with a hx of chest pain, SOB, HTN, fibromyalgia, depression, anxiety, cervical spondylosis, tobacco use last seen by Dr. Bing Matter 08/02/19.   She follows with psychology, her PCP, and pain center.   She was initially referred by her PCP for shortness of breath. EKG 08/02/19 without acute findings. Echo done 06/17/19 with Ef 55% and no acute findings. She was recommended for Lexiscan due to history of tobacco abuse, HTN, and atypical chest pain/SOB.  Lexiscan 08/05/19 Ef 63%, low risk study, no ischemia nor infarction.   Onset a couple of months ago. Tells me it seems to correlate with when she stopped smoking. Reports her SOB worse since last seen. Reports happens at rest, with talking, and with exertion. Symptoms are exacerbated by being outdoors.   She denies chest pain. Sometimes gets swelling in her legs - tells me it is swollen by the end of the day. Relieved by elevating lower extremities. Wears compression stockings intermittently. We discussed that this is likely venous insufficiency as her echo 06/2019 with normal EF 55%. .  She repots no cough. Reports wheezing and using her inhaler regularly. She has been using Xoponex about three times per day and it helps "sometimes". Her son has nebulizer with prednisone and albuterol - tells me this seems to help. Encouraged her to only utilize medications that are prescribed to her.   She  reports she has been taking some "holistic" medicine such as tumeric, drinking lots of teas.   She reports she has had the Xoponex for a number of years. Tells me she had asthma as a child.   Reports dizziness and lightheadedness intermittently. Is being followed by home health nurse and checks her BP regularly at home. Denies chest pain, pressure, tightness, palpitations.   EKGs/Labs/Other Studies Reviewed:   The following studies were reviewed today:  CXR 10/2018 FINDINGS: The heart size and mediastinal contours are within normal limits. Both lungs are clear.  Cervical collar artifact is noted with new fixation hardware noted across the C7-T1 interspace, partially included. No complicating features or hardware loosening identified. No acute nor suspicious osseous lesions. Numerous ventral surgical clips again demonstrated along the abdomen.  IMPRESSION: No active cardiopulmonary disease.  Lexiscan 08/05/19  Nuclear stress EF: 63%.  There was no ST segment deviation noted during stress.  The study is normal.  This is a low risk study.  The left ventricular ejection fraction is normal (55-65%). Normal stress nuclear study with no ischemia or infarction.  Gated ejection fraction 63% with normal wall motion.  Echo 06/17/19 at [facility] 1. Technically fair study 2. No gross structural valvular abnormalities 3. No chamber enlargement 4. LV systolic function normal with EF 55% 6. No evidence intracardiac mass or thrombus 7. No pericardial effusion  EKG:  No EKG today.   Recent Labs:  07/25/19 via [facility]where:  BNP 64.8  Vitamin D 31  K 4.0, creatinine 0.6, AST 22, ALT 19 12/10/18 via [facility] Everywhere  Hb 10.4, Hct 32.3  Recent Lipid Panel    Component Value Date/Time   CHOL 173 11/29/2016 0644   TRIG 84 11/29/2016 0644   HDL 88 11/29/2016 0644   CHOLHDL 2.0 11/29/2016 0644   VLDL 17 11/29/2016 0644   LDLCALC 68 11/29/2016 0644    Home Medications   Current Meds  Medication Sig  . cyanocobalamin (,VITAMIN B-12,) 1000 MCG/ML injection Inject 1 mL into the muscle every 30 (thirty) days.  Marland Kitchen docusate sodium (COLACE) 100 MG capsule Take 1 capsule (100 mg total) by mouth 2 (two) times daily as needed for mild constipation or moderate constipation.  . DULoxetine (CYMBALTA) 60 MG capsule Take 1 capsule by mouth daily.  . ferrous sulfate 325 (65 FE) MG tablet Take 1 tablet (325 mg total) by mouth 2 (two) times daily with a meal.  . folic acid (FOLVITE) 1 MG tablet Take 1 tablet by mouth daily.  . furosemide  (LASIX) 20 MG tablet Take 1 tablet by mouth daily.  . hydrOXYzine (ATARAX/VISTARIL) 50 MG tablet Take 1 tablet (50 mg total) by mouth at bedtime as needed (for sleep).  Marland Kitchen levalbuterol (XOPENEX HFA) 45 MCG/ACT inhaler Inhale 2 puffs into the lungs every 6 (six) hours as needed.  . lidocaine (LIDODERM) 5 % Place 1 patch onto the skin.  Marland Kitchen lidocaine (XYLOCAINE) 5 % ointment Apply topically as needed.  . montelukast (SINGULAIR) 10 MG tablet Take 10 mg by mouth at bedtime.  . pregabalin (LYRICA) 100 MG capsule Take 1 capsule by mouth daily.  . traZODone (DESYREL) 50 MG tablet Take 1 tablet by mouth as needed.  . Vitamin D, Ergocalciferol, (DRISDOL) 1.25 MG (50000 UT) CAPS capsule Take 1 capsule by mouth once a week.      Review of Systems    Review of Systems  Constitution: Negative for chills, fever and malaise/fatigue.  Cardiovascular: Positive for dyspnea on  exertion and leg swelling. Negative for chest pain, near-syncope and orthopnea.  Respiratory: Positive for shortness of breath. Negative for cough and wheezing.   Musculoskeletal: Positive for neck pain.       R hand pain  Gastrointestinal: Negative for nausea and vomiting.  Neurological: Negative for dizziness, light-headedness and weakness.   All other systems reviewed and are otherwise negative except as noted above.  Physical Exam    VS:  BP 112/62 (BP Location: Left Arm, Patient Position: Sitting, Cuff Size: Normal)   Pulse 84   Temp 97.7 F (36.5 C) (Temporal)   Resp 18   Wt 132 lb 8 oz (60.1 kg)   LMP 10/07/2016   SpO2 98%   BMI 24.23 kg/m  , BMI Body mass index is 24.23 kg/m. GEN: Well nourished, well developed, in no acute distress. HEENT: normal. Neck: Supple, no JVD, carotid bruits, or masses. Cardiac: RRR, no murmurs, rubs, or gallops. No clubbing, cyanosis, edema.  Radials/DP/PT 2+ and equal bilaterally.  Respiratory:  Respirations regular and unlabored, clear to auscultation bilaterally. GI: Soft, nontender,  nondistended, BS + x 4. MS: No deformity or atrophy. Skin: Warm and dry, no rash. Neuro:  Strength and sensation are intact. Psych: Normal affect.  Assessment & Plan    1. SOB - Reports SOB at rest, with talking, and with activity. Onset a couple months ago. Reports progressed since last office visit. With normal EF on echo 06/2019 and low risk Lexiscan 07/2019 does not appear cardiac in origin. Consider etiology deconditioning vs pulmonary (COPD/asthma) vs anemia. Risk factors include childhood asthma and 11.5 pack years hx of smoking.   Offered CXR. She politely declines and requests to do further workup through pulmonology, agree with plan.   CBC today to rule out worsening anemia as cause of SOB.   Referral placed to Pulmonology. She has follow up with her PCP tomorrow - encouraged her to ask her PCP if their office is able to perform PFTs. She is discouraged that a pulmonary referral may take a few weeks to be scheduled and asked her to be patient. She may seek referral through [facility] if she chooses.   2. Previous tobacco use - Previous smoker 0.5 PPD for 23 years. Continued tobacco cessation encouraged. States this is the longest she has quit.   3. Anemia - Possible etiology SOB. CBC today.   4. Cardiovascular risk assessment - Low risk Lexiscan 07/2019. BP optimized on no anti-hypertensive medication. Continue smoking cessation. Recommend low salt, heart healthy diet. Recommend regular exercise - she will continue to work with PT. Lipid profile optimal in 2017, would recommend she follow with her PCP for repeat screening.   5. Lower extremity edema - Not present on exam today. Reports intermittent bilateral LE edema at end of day after standing. Likely result of dependent position and possible venous insufficiency. No evidence of HF on exam nor echocardiogram. Recommend low salt diet. Recommend elevate lower extremities. Recommend compression stockings.   Disposition: Follow up in 4 month(s)  with Dr. Bing Matter.   Alver Sorrow, NP 08/30/2019, 10:41 AM

## 2019-08-30 NOTE — Patient Instructions (Signed)
Medication Instructions:  No medication changes today.   If you need a refill on your cardiac medications before your next appointment, please call your pharmacy.   Lab work: Your physician recommends that you return for lab work today: CBC  If you have labs (blood work) drawn today and your tests are completely normal, you will receive your results only by: Marland Kitchen MyChart Message (if you have MyChart) OR . A paper copy in the mail If you have any lab test that is abnormal or we need to change your treatment, we will call you to review the results.  Testing/Procedures: None ordered today.  Follow-Up: At [facility], you and your health needs are our priority.  As part of our continuing mission to provide you with exceptional heart care, we have created designated Provider Care Teams.  These Care Teams include your primary Cardiologist (physician) and Advanced Practice Providers (APPs -  Physician Assistants and Nurse Practitioners) who all work together to provide you with the care you need, when you need it. You will need a follow up appointment in 4 months.  You may see Jenne Campus, MD or another member of our [place] Provider Team in [place]: Shirlee More, MD . Jyl Heinz, MD . Berniece Salines, MD . Laurann Montana, NP  Any Other Special Instructions Will Be Listed Below (If Applicable).  A referral has been placed to pulmonology for you.

## 2019-08-31 LAB — CBC
Hematocrit: 34.6 % (ref 34.0–46.6)
Hemoglobin: 11.2 g/dL (ref 11.1–15.9)
MCH: 28.5 pg (ref 26.6–33.0)
MCHC: 32.4 g/dL (ref 31.5–35.7)
MCV: 88 fL (ref 79–97)
Platelets: 336 10*3/uL (ref 150–450)
RBC: 3.93 x10E6/uL (ref 3.77–5.28)
RDW: 13.8 % (ref 11.7–15.4)
WBC: 4.7 10*3/uL (ref 3.4–10.8)

## 2019-09-06 NOTE — Telephone Encounter (Signed)
Patient called back, I informed her the pulmonology referral was sent in. She reports that her pcp already got her set up for an appointment. I let her know to just decline if she gets a call for a pulmonology appointment then since she is already set up with them.

## 2019-09-06 NOTE — Telephone Encounter (Signed)
Left message for patient to return call to inform hear referral has been sent.

## 2019-09-23 ENCOUNTER — Encounter (HOSPITAL_BASED_OUTPATIENT_CLINIC_OR_DEPARTMENT_OTHER): Payer: Self-pay | Admitting: *Deleted

## 2019-09-23 ENCOUNTER — Other Ambulatory Visit: Payer: Self-pay

## 2019-09-23 ENCOUNTER — Emergency Department (HOSPITAL_BASED_OUTPATIENT_CLINIC_OR_DEPARTMENT_OTHER)
Admission: EM | Admit: 2019-09-23 | Discharge: 2019-09-23 | Disposition: A | Discharge status: Home / Self Care | Payer: Medicare Other | Attending: Emergency Medicine | Admitting: Emergency Medicine

## 2019-09-23 DIAGNOSIS — N764 Abscess of vulva: Secondary | ICD-10-CM | POA: Diagnosis present

## 2019-09-23 DIAGNOSIS — Z87891 Personal history of nicotine dependence: Secondary | ICD-10-CM | POA: Diagnosis not present

## 2019-09-23 DIAGNOSIS — Z91040 Latex allergy status: Secondary | ICD-10-CM | POA: Insufficient documentation

## 2019-09-23 DIAGNOSIS — L0291 Cutaneous abscess, unspecified: Secondary | ICD-10-CM

## 2019-09-23 MED ORDER — LIDOCAINE HCL (PF) 1 % IJ SOLN
5.0000 mL | Freq: Once | INTRAMUSCULAR | Status: AC
  Administered 2019-09-23: 19:00:00 5 mL
  Filled 2019-09-23: qty 5

## 2019-09-23 MED ORDER — SULFAMETHOXAZOLE-TRIMETHOPRIM 800-160 MG PO TABS: 1.0000 | ORAL_TABLET | Freq: Two times a day (BID) | ORAL | 0 refills | Status: AC

## 2019-09-23 NOTE — ED Triage Notes (Signed)
Possible abscess on her labia.

## 2019-09-23 NOTE — ED Provider Notes (Signed)
[facility] EMERGENCY DEPARTMENT Provider Note   CSN: [id number] Arrival date & time: 10/16/20161096045  1824     History   Chief Complaint No chief complaint on file.   HPI Leslie Sanders is a 43 y.o. female.     Patient is a 43 year old female who presents with a vaginal abscess.  She notes over the last couple days is gotten increasingly swollen.  She had a similar abscess in the same location in July of this year that required I&D.  That was the first time it had occurred.  She has not noted any drainage.  No fevers.  She was recently in a rehab facility following a cervical spine injury that she is currently out of rehab.  Gotten increasingly more painful and swollen.     Past Medical History:  Diagnosis Date  . Anemia    (a) chronic blood loss hx of menorrhagia s/p endometrial biopsy 04/2015 (b) iron deficient  . Anxiety   . B12 deficiency   . Bipolar 1 disorder (HCC)   . Cervical spinal stenosis   . Cervical spondylosis without myelopathy 07/08/2016  . Chronic pain of right thumb 04/28/2018  . Depression   . GERD (gastroesophageal reflux disease)   . Hemorrhoids    Pt with constipation secondary to iron supplement. Pt has had rectal bleed secondary to internal hemorrhoids. Pt is s/p EGD/colonoscopy 05/2012  . 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 Migraine   . PONV (postoperative nausea and vomiting)   . PTSD (post-traumatic stress disorder)   . Seasonal allergies   . Vitamin E deficiency   . Zinc deficiency     Patient Active Problem List   Diagnosis Date Noted  . Atypical chest pain 08/02/2019  . History of smoking 08/02/2019  . Dyspnea on exertion 08/02/2019  . Abnormal EKG 07/19/2019  . Major depression, recurrent (HCC) 11/27/2016  . Bipolar disorder, most recent episode depressed (HCC) 10/12/2016  . Anemia, iron deficiency 10/12/2016  . Alcohol use disorder, moderate, dependence (HCC) 10/12/2016  . Lactose intolerance 10/12/2016    Past  Surgical History:  Procedure Laterality Date  . ABDOMINAL HYSTERECTOMY    . BREAST LUMPECTOMY Bilateral   . BREAST REDUCTION SURGERY    . Carpal Metacarpal Arthroplasty Right 05/13/2019  . CHOLECYSTECTOMY  1990  . HAND SURGERY    . HYSTERECTOMY ABDOMINAL WITH SALPINGECTOMY  11/2017  . POSTERIOR CERVICAL LAMINECTOMY Bilateral 05/23/2019  . ROUX-EN-Y GASTRIC BYPASS  2003   [place]  . SPINAL FUSION  05/23/2019     OB History   No obstetric history on file.      Home Medications    Prior to Admission medications   Medication Sig Start Date End Date Taking? Authorizing Provider  Buprenorphine HCl (BELBUCA) 75 MCG FILM Place 75 mcg inside cheek daily.    [provider]  cyanocobalamin (,VITAMIN B-12,) 1000 MCG/ML injection Inject 1 mL into the muscle every 30 (thirty) days.    [provider]  docusate sodium (COLACE) 100 MG capsule Take 1 capsule (100 mg total) by mouth 2 (two) times daily as needed for mild constipation or moderate constipation. 12/15/16   Oneta RackLewis, Tanika N, NP  DULoxetine (CYMBALTA) 60 MG capsule Take 1 capsule by mouth daily. 03/10/19   [provider]  ferrous sulfate 325 (65 FE) MG tablet Take 1 tablet (325 mg total) by mouth 2 (two) times daily with a meal. 12/15/16   Oneta RackLewis, Tanika N, NP  folic  acid (FOLVITE) 1 MG tablet Take 1 tablet by mouth daily. 04/07/19   [provider]  furosemide (LASIX) 20 MG tablet Take 1 tablet by mouth daily. 07/08/19   [provider]  hydrOXYzine (ATARAX/VISTARIL) 50 MG tablet Take 1 tablet (50 mg total) by mouth at bedtime as needed (for sleep). 12/15/16   Derrill Center, NP  levalbuterol A Rosie Place HFA) 45 MCG/ACT inhaler Inhale 2 puffs into the lungs every 6 (six) hours as needed. 03/29/19   [provider]  lidocaine (LIDODERM) 5 % Place 1 patch onto the skin. 03/10/19   [provider]  lidocaine (XYLOCAINE) 5 % ointment Apply topically as needed. 07/20/19   [provider]   montelukast (SINGULAIR) 10 MG tablet Take 10 mg by mouth at bedtime.    [provider]  pregabalin (LYRICA) 100 MG capsule Take 1 capsule by mouth daily. 06/27/19   [provider]  sulfamethoxazole-trimethoprim (BACTRIM DS) 800-160 MG tablet Take 1 tablet by mouth 2 (two) times daily for 7 days. 09/23/19 09/30/19  Malvin Johns, MD  traZODone (DESYREL) 50 MG tablet Take 1 tablet by mouth as needed. 07/08/19   [provider]  Vitamin D, Ergocalciferol, (DRISDOL) 1.25 MG (50000 UT) CAPS capsule Take 1 capsule by mouth once a week. 07/25/19   [provider]    Family History Family History  Problem Relation Age of Onset  . Schizophrenia Maternal Aunt   . ADD / ADHD Son   . ODD Son   . Healthy Mother   . Breast cancer Mother   . Arthritis/Rheumatoid Mother   . Healthy Father   . Rheum arthritis Maternal Grandmother     Social History Social History   Tobacco Use  . Smoking status: Former Smoker    Packs/day: 0.50    Years: 23.00    Pack years: 11.50    Types: Cigarettes  . Smokeless tobacco: Never Used  Substance Use Topics  . Alcohol use: Not Currently    Alcohol/week: 28.0 standard drinks    Types: 28 Cans of beer per week    Comment: Nothing in 4 years  . Drug use: No     Allergies   Tylenol [acetaminophen], Hydrocodone-acetaminophen, Nsaids, and Latex   Review of Systems Review of Systems  Constitutional: Negative for fever.  Gastrointestinal: Negative for nausea and vomiting.  Genitourinary: Negative for pelvic pain, vaginal bleeding and vaginal discharge.  Skin: Positive for wound.  Neurological: Negative for weakness, numbness and headaches.     Physical Exam Updated Vital Signs BP 116/76   Pulse 88   Temp 98 F (36.7 C) (Oral)   Resp 18   Ht 5\' 2"  (1.575 m)   Wt 59 kg   LMP 10/07/2016   SpO2 100%   BMI 23.78 kg/m   Physical Exam Constitutional:      Appearance: She is well-developed.  HENT:     Head:  Normocephalic and atraumatic.  Neck:     Musculoskeletal: Normal range of motion and neck supple.  Cardiovascular:     Rate and Rhythm: Normal rate.  Pulmonary:     Effort: Pulmonary effort is normal.  Genitourinary:    Comments: Patient has a 2 cm x 2 cm fluctuant abscess to her right labia majora.  There is no drainage.  There is a nontender mobile cystic structure that is pea-sized in the suprapubic area.  It appears deeper and is mobile. Skin:    General: Skin is warm and dry.  Neurological:  Mental Status: She is alert and oriented to person, place, and time.      ED Treatments / Results  Labs (all labs ordered are listed, but only abnormal results are displayed) Labs Reviewed - No data to display  EKG None  Radiology No results found.  Procedures .Marland KitchenIncision and Drainage  Date/Time: 09/23/2019 7:30 PM Performed by: Rolan Bucco, MD Authorized by: Rolan Bucco, MD   Consent:    Consent obtained:  Verbal   Consent given by:  Patient   Risks discussed:  Bleeding, incomplete drainage and pain   Alternatives discussed:  No treatment Location:    Type:  Abscess   Size:  2   Location:  Anogenital   Anogenital location: right labia majora. Pre-procedure details:    Skin preparation:  Betadine Anesthesia (see MAR for exact dosages):    Anesthesia method:  Local infiltration   Local anesthetic:  Lidocaine 1% w/o epi Procedure type:    Complexity:  Simple Procedure details:    Incision types:  Elliptical   Incision depth:  Dermal   Scalpel blade:  11   Wound management:  Probed and deloculated   Drainage:  Purulent   Drainage amount:  Moderate   Wound treatment:  Wound left open   Packing materials:  None Post-procedure details:    Patient tolerance of procedure:  Tolerated well, no immediate complications   (including critical care time)  Medications Ordered in ED Medications  lidocaine (PF) (XYLOCAINE) 1 % injection 5 mL (5 mLs Infiltration Given  by Other 09/23/19 1852)     Initial Impression / Assessment and Plan / ED Course  I have reviewed the triage vital signs and the nursing notes.  Pertinent labs & imaging results that were available during my care of the patient were reviewed by me and considered in my medical decision making (see chart for details).        Patient presents with an abscess to her left labia.  It appears localized without suggestions of deep tissue infection/Fournier's gangrene.  The wound was opened in the ED with a moderate amount of purulent drainage.  She is feeling little bit better.  She was advised on continued use of warm compresses.  She was given prescription for Bactrim.  She was advised to follow-up with her OB/GYN who she saw few days ago for a wound recheck.  Return precautions were given.  Final Clinical Impressions(s) / ED Diagnoses   Final diagnoses:  Abscess    ED Discharge Orders         Ordered    sulfamethoxazole-trimethoprim (BACTRIM DS) 800-160 MG tablet  2 times daily     09/23/19 1930           Rolan Bucco, MD 09/23/19 1932

## 2019-09-26 ENCOUNTER — Encounter: Payer: Self-pay | Admitting: Critical Care Medicine

## 2019-09-26 ENCOUNTER — Ambulatory Visit (INDEPENDENT_AMBULATORY_CARE_PROVIDER_SITE_OTHER): Payer: Medicare Other | Admitting: Critical Care Medicine

## 2019-09-26 ENCOUNTER — Other Ambulatory Visit: Payer: Self-pay

## 2019-09-26 VITALS — BP 120/70 | HR 72 | Ht 62.0 in | Wt 128.4 lb

## 2019-09-26 DIAGNOSIS — R0600 Dyspnea, unspecified: Secondary | ICD-10-CM | POA: Diagnosis not present

## 2019-09-26 DIAGNOSIS — R0609 Other forms of dyspnea: Secondary | ICD-10-CM

## 2019-09-26 DIAGNOSIS — J4540 Moderate persistent asthma, uncomplicated: Secondary | ICD-10-CM

## 2019-09-26 LAB — BASIC METABOLIC PANEL
BUN: 8 mg/dL (ref 6–23)
CO2: 27 mEq/L (ref 19–32)
Calcium: 8.6 mg/dL (ref 8.4–10.5)
Chloride: 107 mEq/L (ref 96–112)
Creatinine, Ser: 0.55 mg/dL (ref 0.40–1.20)
GFR: 145.08 mL/min (ref >60.00)
Glucose, Bld: 95 mg/dL (ref 70–99)
Potassium: 4.1 mEq/L (ref 3.5–5.1)
Sodium: 141 mEq/L (ref 135–145)

## 2019-09-26 MED ORDER — BUDESONIDE-FORMOTEROL FUMARATE 160-4.5 MCG/ACT IN AERO: 2.0000 | INHALATION_SPRAY | Freq: Two times a day (BID) | RESPIRATORY_TRACT | 6 refills | Status: DC

## 2019-09-26 MED ORDER — ALBUTEROL SULFATE HFA 108 (90 BASE) MCG/ACT IN AERS: 2.0000 | INHALATION_SPRAY | Freq: Four times a day (QID) | RESPIRATORY_TRACT | 5 refills | Status: DC | PRN

## 2019-09-26 NOTE — Patient Instructions (Addendum)
Thank you for visiting Dr. Carlis Abbott at [facility]. We recommend the following: Orders Placed This Encounter  Procedures  . CT Chest W Contrast  . Basic Metabolic Panel (BMET)  . Pulmonary function test  . VAS [place] LOWER EXTREMITY VENOUS (DVT)   Orders Placed This Encounter  Procedures  . CT Chest W Contrast    Within 1 week    Standing Status:   Future    Standing Expiration Date:   11/25/2020    Order Specific Question:   ** REASON FOR EXAM (FREE TEXT)    Answer:   rule out PE- SOB since neck surgery    Order Specific Question:   If indicated for the ordered procedure, I authorize the administration of contrast media per Radiology protocol    Answer:   Yes    Order Specific Question:   Is patient pregnant?    Answer:   No    Comments:   hysterectomy    Order Specific Question:   Preferred imaging location?    Answer:   [facility]    Order Specific Question:   Radiology Contrast Protocol - do NOT remove file path    Answer:   [email or web address]  . Basic Metabolic Panel (BMET)    Standing Status:   Future    Standing Expiration Date:   09/25/2020  . Pulmonary function test    Standing Status:   Future    Standing Expiration Date:   09/25/2020    Order Specific Question:   Where should this test be performed?    Answer:   [facility]    Order Specific Question:   Full PFT: includes the following: basic spirometry, spirometry pre & post bronchodilator, diffusion capacity (DLCO), lung volumes    Answer:   Full PFT    Meds ordered this encounter  Medications  . albuterol (VENTOLIN HFA) 108 (90 Base) MCG/ACT inhaler    Sig: Inhale 2 puffs into the lungs every 6 (six) hours as needed for wheezing or shortness of breath.    Dispense:  6.7 g    Refill:  5  . budesonide-formoterol (SYMBICORT) 160-4.5 MCG/ACT inhaler    Sig: Inhale 2 puffs into the lungs 2 (two) times daily.    Dispense:  1 Inhaler    Refill:  6    Return in about 5  weeks (around 10/31/2019).    Please do your part to reduce the spread of COVID-19.
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
## 2019-09-29 ENCOUNTER — Ambulatory Visit (HOSPITAL_BASED_OUTPATIENT_CLINIC_OR_DEPARTMENT_OTHER): Payer: Medicare Other

## 2019-09-29 DIAGNOSIS — B009 Herpesviral infection, unspecified: Secondary | ICD-10-CM | POA: Insufficient documentation

## 2019-09-30 ENCOUNTER — Telehealth: Payer: Self-pay | Admitting: Critical Care Medicine

## 2019-09-30 ENCOUNTER — Encounter (HOSPITAL_BASED_OUTPATIENT_CLINIC_OR_DEPARTMENT_OTHER): Payer: Self-pay

## 2019-09-30 ENCOUNTER — Ambulatory Visit (HOSPITAL_BASED_OUTPATIENT_CLINIC_OR_DEPARTMENT_OTHER)
Admission: RE | Admit: 2019-09-30 | Discharge: 2019-09-30 | Disposition: A | Discharge status: Home / Self Care | Payer: Medicare Other | Source: Ambulatory Visit | Attending: Critical Care Medicine | Admitting: Critical Care Medicine

## 2019-09-30 ENCOUNTER — Other Ambulatory Visit: Payer: Self-pay

## 2019-09-30 DIAGNOSIS — R0602 Shortness of breath: Secondary | ICD-10-CM | POA: Insufficient documentation

## 2019-09-30 DIAGNOSIS — R0609 Other forms of dyspnea: Secondary | ICD-10-CM

## 2019-09-30 DIAGNOSIS — R0600 Dyspnea, unspecified: Secondary | ICD-10-CM | POA: Diagnosis present

## 2019-09-30 MED ORDER — IOHEXOL 350 MG/ML SOLN
75.0000 mL | Freq: Once | INTRAVENOUS | Status: AC | PRN
  Administered 2019-09-30: 11:00:00 75 mL via INTRAVENOUS

## 2019-09-30 NOTE — Telephone Encounter (Signed)
Called and spoke to Prosser at [place]. Cyril Mourning stated the CT of chest that was ordered for patient would not show a PE if that is being ruled out. Per Dr. Ainsley Spinner note and order she wanted to have scan to rule out PE due to dyspnea.  Updated order for scan. Nothing further needed at this time.

## 2019-09-30 NOTE — Progress Notes (Addendum)
Bilateral lower extremity venous doppler performed  Leslie Sanders 09/30/2019, 10:00 AM

## 2019-10-05 ENCOUNTER — Telehealth: Payer: Self-pay | Admitting: *Deleted

## 2019-10-05 DIAGNOSIS — R0609 Other forms of dyspnea: Secondary | ICD-10-CM

## 2019-10-05 DIAGNOSIS — J4540 Moderate persistent asthma, uncomplicated: Secondary | ICD-10-CM

## 2019-10-05 DIAGNOSIS — R0602 Shortness of breath: Secondary | ICD-10-CM

## 2019-10-05 NOTE — Telephone Encounter (Signed)
Patient unable to get nebulizer machine due to being seen by two MDs want to be seen by Dr. Carlis Abbott.   Dr. Carlis Abbott can you please send in nebulizer machine patient on albuterol nebs but not being processed through other office.   Dr. Carlis Abbott please advise.

## 2019-10-05 NOTE — Telephone Encounter (Signed)
-----   Message from [name], DO sent at 10/04/2019  7:21 PM EDT ----- Please let Leslie Sanders know that her CT scan and her leg ultrasounds did not show blood clot.  Her lung CT was normal and did not explain why she is short of breath.

## 2019-10-06 ENCOUNTER — Telehealth: Payer: Self-pay | Admitting: Critical Care Medicine

## 2019-10-06 DIAGNOSIS — R0609 Other forms of dyspnea: Secondary | ICD-10-CM

## 2019-10-06 MED ORDER — ALBUTEROL SULFATE (2.5 MG/3ML) 0.083% IN NEBU: 2.5000 mg | INHALATION_SOLUTION | Freq: Four times a day (QID) | RESPIRATORY_TRACT | 0 refills | Status: DC | PRN

## 2019-10-06 NOTE — Telephone Encounter (Signed)
Call made to patient, made aware the original order was placed for a home nebulizer machine. I made her aware I have placed the order for a nebulizer machine through a medical supply company. She also states the other pulmonologist she was seeing got upset that she choose to go with Dr. Carlis Abbott and would not refill her albuterol medication. Refill sent to [facility] pharmacy). Voiced understanding. Nothing further needed at this time.   It appears Dr. Carlis Abbott placed the order for the nebulizer but she placed the home nebulizer order. Placed new order for patient to receive through DME company.

## 2019-10-06 NOTE — Telephone Encounter (Signed)
Let patient know order was placed by Dr. Carlis Abbott Nothing further is needed

## 2019-10-07 ENCOUNTER — Telehealth: Payer: Self-pay | Admitting: Critical Care Medicine

## 2019-10-07 DIAGNOSIS — J4540 Moderate persistent asthma, uncomplicated: Secondary | ICD-10-CM

## 2019-10-07 NOTE — Telephone Encounter (Signed)
Order was sent to [facility].

## 2019-10-07 NOTE — Telephone Encounter (Signed)
Yes, that's fine 

## 2019-10-07 NOTE — Telephone Encounter (Signed)
Spoke with [facility] - unfortunately the order for the neb machine itself has not yet been signed by Dr. Carlis Abbott and she is not on the schedule in qgenda for today.  Beth, as APP of the day may we place the order for neb machine under your name?  Patient was last seen 10.19.2020 by Dr Carlis Abbott for consult and per the 10.28.2020 phone note albuterol neb was rx'd and the order was placed for the machine by Dr. Carlis Abbott.  Per today's phone note, patient has received her medication but not yet the machine.  Please advise if we may place order under your name, thank you!

## 2019-10-07 NOTE — Telephone Encounter (Signed)
I placed the order in BW name, will await signature. FYI PCC's

## 2019-10-07 NOTE — Telephone Encounter (Signed)
Pt is calling to see what company is bringing the nebulizer - [facility] said that they received the RX for the med only and not for the machine. Please advise patient CB# [phone]

## 2019-10-11 ENCOUNTER — Telehealth: Payer: Self-pay | Admitting: Critical Care Medicine

## 2019-10-11 MED ORDER — ALBUTEROL SULFATE (2.5 MG/3ML) 0.083% IN NEBU: 2.5000 mg | INHALATION_SOLUTION | Freq: Four times a day (QID) | RESPIRATORY_TRACT | 1 refills | Status: DC | PRN

## 2019-10-11 NOTE — Telephone Encounter (Signed)
I called and spoke with the patient and she reports that she would like her Albuterol nebulizer sent to her local pharmacy, because it has taken too long to receive it from [facility]. I have sent the albuterol to [facility] as requested. Nothing further is needed at this time.

## 2019-10-11 NOTE — Telephone Encounter (Signed)
Pt is returning call - they have the machine and she needs the RX -CB# [phone]

## 2019-10-11 NOTE — Telephone Encounter (Signed)
ATC pt, no answer. Left message for pt to call back.  

## 2019-10-31 ENCOUNTER — Encounter: Payer: Self-pay | Admitting: Critical Care Medicine

## 2019-10-31 ENCOUNTER — Other Ambulatory Visit: Payer: Self-pay

## 2019-10-31 ENCOUNTER — Ambulatory Visit (INDEPENDENT_AMBULATORY_CARE_PROVIDER_SITE_OTHER): Payer: Medicare Other | Admitting: Critical Care Medicine

## 2019-10-31 VITALS — BP 110/70 | HR 64 | Temp 97.5°F | Ht 62.0 in | Wt 134.4 lb

## 2019-10-31 DIAGNOSIS — R0600 Dyspnea, unspecified: Secondary | ICD-10-CM | POA: Diagnosis not present

## 2019-10-31 DIAGNOSIS — Z23 Encounter for immunization: Secondary | ICD-10-CM | POA: Diagnosis not present

## 2019-10-31 DIAGNOSIS — R0609 Other forms of dyspnea: Secondary | ICD-10-CM

## 2019-10-31 NOTE — Addendum Note (Signed)
Addended by: Amado Coe on: 10/31/2019 12:01 PM   Modules accepted: Orders

## 2019-10-31 NOTE — Addendum Note (Signed)
Addended by: Lia Foyer R on: 10/31/2019 11:34 AM   Modules accepted: Orders
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
## 2019-10-31 NOTE — Patient Instructions (Addendum)
Thank you for visiting Dr. Carlis Abbott at [facility]. We recommend the following: Orders Placed This Encounter  Procedures  . DG Sniff Test  . NIF   Orders Placed This Encounter  Procedures  . DG Sniff Test    Standing Status:   Future    Standing Expiration Date:   12/30/2020    Order Specific Question:   Reason for Exam (SYMPTOM  OR DIAGNOSIS REQUIRED)    Answer:   concern for NM weakness causing dyspnea, prev c-spine surgery    Order Specific Question:   Is patient pregnant?    Answer:   Unknown (Please Explain)    Order Specific Question:   Preferred imaging location?    Answer:   [facility]    Order Specific Question:   Radiology Contrast Protocol - do NOT remove file path    Answer:   [email or web address]  . NIF    Standing Status:   Future    Standing Expiration Date:   10/30/2020     Return in about 6 weeks (around 12/12/2019).    Please do your part to reduce the spread of COVID-19.

## 2019-11-07 ENCOUNTER — Ambulatory Visit (HOSPITAL_COMMUNITY): Payer: Medicare Other

## 2019-11-07 ENCOUNTER — Ambulatory Visit (HOSPITAL_COMMUNITY)
Admission: RE | Admit: 2019-11-07 | Discharge: 2019-11-07 | Disposition: A | Discharge status: Home / Self Care | Payer: Medicare Other | Source: Ambulatory Visit | Attending: Critical Care Medicine | Admitting: Critical Care Medicine

## 2019-11-07 ENCOUNTER — Other Ambulatory Visit: Payer: Self-pay

## 2019-11-07 DIAGNOSIS — R0600 Dyspnea, unspecified: Secondary | ICD-10-CM | POA: Diagnosis not present

## 2019-11-07 DIAGNOSIS — R0609 Other forms of dyspnea: Secondary | ICD-10-CM

## 2019-11-07 LAB — BLOOD GAS, ARTERIAL
Acid-base deficit: 0.2 mmol/L (ref 0.0–2.0)
Bicarbonate: 22.6 mmol/L (ref 20.0–28.0)
Drawn by: 211791
FIO2: 21
O2 Saturation: 98.1 %
Patient temperature: 37
pCO2 arterial: 28.7 mmHg — ABNORMAL LOW (ref 32.0–48.0)
pH, Arterial: 7.508 — ABNORMAL HIGH (ref 7.350–7.450)
pO2, Arterial: 113 mmHg — ABNORMAL HIGH (ref 83.0–108.0)

## 2019-11-09 ENCOUNTER — Other Ambulatory Visit: Payer: Self-pay

## 2019-11-09 ENCOUNTER — Ambulatory Visit (HOSPITAL_COMMUNITY)
Admission: RE | Admit: 2019-11-09 | Discharge: 2019-11-09 | Disposition: A | Discharge status: Home / Self Care | Payer: Medicare Other | Source: Ambulatory Visit | Attending: Critical Care Medicine | Admitting: Critical Care Medicine

## 2019-11-09 DIAGNOSIS — R0600 Dyspnea, unspecified: Secondary | ICD-10-CM | POA: Diagnosis present

## 2019-11-09 DIAGNOSIS — R0609 Other forms of dyspnea: Secondary | ICD-10-CM

## 2019-11-10 NOTE — Progress Notes (Signed)
Please let Leslie Sanders know that her diaphragm test with radiology was normal. Her arterial blood gas that was drawn did not show a reason for her shortness of breath. Her oxygen level was very normal on that. Please let her know that we still need to see the results of your pulmonary function study to try to understand why she is short of breath. Thanks!  LPC

## 2019-11-14 NOTE — Telephone Encounter (Signed)
Patient emailed asking about her arterial blood gas results. She this procedure done on 11/30, results are in her chart.   Dr. Carlis Abbott, please advise. Thanks!

## 2019-11-21 ENCOUNTER — Other Ambulatory Visit (HOSPITAL_COMMUNITY)
Admission: RE | Admit: 2019-11-21 | Discharge: 2019-11-21 | Disposition: A | Discharge status: Home / Self Care | Payer: Medicare Other | Source: Ambulatory Visit | Attending: Critical Care Medicine | Admitting: Critical Care Medicine

## 2019-11-21 DIAGNOSIS — Z20828 Contact with and (suspected) exposure to other viral communicable diseases: Secondary | ICD-10-CM | POA: Diagnosis not present

## 2019-11-21 DIAGNOSIS — Z01812 Encounter for preprocedural laboratory examination: Secondary | ICD-10-CM | POA: Insufficient documentation

## 2019-11-22 LAB — NOVEL CORONAVIRUS, NAA (HOSP ORDER, SEND-OUT TO REF LAB; TAT 18-24 HRS): SARS-CoV-2, NAA: NOT DETECTED

## 2019-11-25 ENCOUNTER — Other Ambulatory Visit: Payer: Self-pay

## 2019-11-25 ENCOUNTER — Ambulatory Visit: Payer: Medicare Other

## 2019-11-25 DIAGNOSIS — R0609 Other forms of dyspnea: Secondary | ICD-10-CM

## 2019-11-25 LAB — PULMONARY FUNCTION TEST
DL/VA % pred: 89 %
DL/VA: 3.98 ml/min/mmHg/L
DLCO unc % pred: 71 %
DLCO unc: 14.36 ml/min/mmHg
FEF 25-75 Post: 1.44 L/sec
FEF 25-75 Pre: 0.9 L/sec
FEF2575-%Change-Post: 59 %
FEF2575-%Pred-Post: 56 %
FEF2575-%Pred-Pre: 35 %
FEV1-%Change-Post: 17 %
FEV1-%Pred-Post: 73 %
FEV1-%Pred-Pre: 62 %
FEV1-Post: 1.67 L
FEV1-Pre: 1.42 L
FEV1FVC-%Change-Post: 10 %
FEV1FVC-%Pred-Pre: 81 %
FEV6-%Change-Post: 6 %
FEV6-%Pred-Post: 82 %
FEV6-%Pred-Pre: 77 %
FEV6-Post: 2.24 L
FEV6-Pre: 2.1 L
FEV6FVC-%Pred-Post: 102 %
FEV6FVC-%Pred-Pre: 102 %
FVC-%Change-Post: 6 %
FVC-%Pred-Post: 80 %
FVC-%Pred-Pre: 75 %
FVC-Post: 2.24 L
FVC-Pre: 2.1 L
Post FEV1/FVC ratio: 75 %
Post FEV6/FVC ratio: 100 %
Pre FEV1/FVC ratio: 68 %
Pre FEV6/FVC Ratio: 100 %
RV % pred: 123 %
RV: 1.94 L
TLC % pred: 84 %
TLC: 4.03 L

## 2019-12-12 ENCOUNTER — Ambulatory Visit (INDEPENDENT_AMBULATORY_CARE_PROVIDER_SITE_OTHER): Payer: Medicare Other | Admitting: Critical Care Medicine

## 2019-12-12 ENCOUNTER — Encounter: Payer: Self-pay | Admitting: Critical Care Medicine

## 2019-12-12 ENCOUNTER — Telehealth: Payer: Self-pay | Admitting: Critical Care Medicine

## 2019-12-12 ENCOUNTER — Other Ambulatory Visit: Payer: Self-pay

## 2019-12-12 DIAGNOSIS — R0609 Other forms of dyspnea: Secondary | ICD-10-CM

## 2019-12-12 DIAGNOSIS — K219 Gastro-esophageal reflux disease without esophagitis: Secondary | ICD-10-CM | POA: Diagnosis not present

## 2019-12-12 DIAGNOSIS — J449 Chronic obstructive pulmonary disease, unspecified: Secondary | ICD-10-CM | POA: Diagnosis not present

## 2019-12-12 DIAGNOSIS — R0600 Dyspnea, unspecified: Secondary | ICD-10-CM

## 2019-12-12 DIAGNOSIS — J4540 Moderate persistent asthma, uncomplicated: Secondary | ICD-10-CM

## 2019-12-12 MED ORDER — ALBUTEROL SULFATE HFA 108 (90 BASE) MCG/ACT IN AERS: 2.0000 | INHALATION_SPRAY | RESPIRATORY_TRACT | 5 refills | Status: DC | PRN

## 2019-12-12 MED ORDER — FAMOTIDINE 20 MG PO TABS: 20.0000 mg | ORAL_TABLET | Freq: Two times a day (BID) | ORAL | 11 refills | Status: DC

## 2019-12-12 MED ORDER — BUDESONIDE-FORMOTEROL FUMARATE 160-4.5 MCG/ACT IN AERO: 2.0000 | INHALATION_SPRAY | Freq: Two times a day (BID) | RESPIRATORY_TRACT | 6 refills | Status: DC

## 2019-12-12 MED ORDER — FLUTICASONE PROPIONATE 50 MCG/ACT NA SUSP: 2.0000 | Freq: Every day | NASAL | 11 refills | Status: DC

## 2019-12-12 MED ORDER — SPIRIVA RESPIMAT 2.5 MCG/ACT IN AERS: 1.0000 | INHALATION_SPRAY | Freq: Every day | RESPIRATORY_TRACT | 11 refills | Status: DC
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
## 2019-12-12 NOTE — Patient Instructions (Addendum)
Thank you for visiting Dr. Chestine Spore at [facility]. We recommend the following:   Meds ordered this encounter  Medications  . albuterol (VENTOLIN HFA) 108 (90 Base) MCG/ACT inhaler    Sig: Inhale 2 puffs into the lungs every 4 (four) hours as needed for wheezing or shortness of breath.    Dispense:  18 g    Refill:  5  . famotidine (PEPCID) 20 MG tablet    Sig: Take 1 tablet (20 mg total) by mouth 2 (two) times daily.    Dispense:  30 tablet    Refill:  11  . fluticasone (FLONASE) 50 MCG/ACT nasal spray    Sig: Place 2 sprays into both nostrils daily.    Dispense:  16 g    Refill:  11  . Tiotropium Bromide Monohydrate (SPIRIVA RESPIMAT) 2.5 MCG/ACT AERS    Sig: Inhale 1 puff into the lungs daily.    Dispense:  4 g    Refill:  11  . budesonide-formoterol (SYMBICORT) 160-4.5 MCG/ACT inhaler    Sig: Inhale 2 puffs into the lungs 2 (two) times daily.    Dispense:  1 Inhaler    Refill:  6    Return in about 1 month (around 01/12/2020).    Please do your part to reduce the spread of COVID-19.     Food Choices for Gastroesophageal Reflux Disease, Adult When you have gastroesophageal reflux disease (GERD), the foods you eat and your eating habits are very important. Choosing the right foods can help ease your discomfort. Think about working with a nutrition specialist (dietitian) to help you make good choices. What are tips for following this plan?  Meals  Choose healthy foods that are low in fat, such as fruits, vegetables, whole grains, low-fat dairy products, and lean meat, fish, and poultry.  Eat small meals often instead of 3 large meals a day. Eat your meals slowly, and in a place where you are relaxed. Avoid bending over or lying down until 2-3 hours after eating.  Avoid eating meals 2-3 hours before bed.  Avoid drinking a lot of liquid with meals.  Cook foods using methods other than frying. Bake, grill, or broil food instead.  Avoid or  limit: ? Chocolate. ? Peppermint or spearmint. ? Alcohol. ? Pepper. ? Black and decaffeinated coffee. ? Black and decaffeinated tea. ? Bubbly (carbonated) soft drinks. ? Caffeinated energy drinks and soft drinks.  Limit high-fat foods such as: ? Fatty meat or fried foods. ? Whole milk, cream, butter, or ice cream. ? Nuts and nut butters. ? Pastries, donuts, and sweets made with butter or shortening.  Avoid foods that cause symptoms. These foods may be different for everyone. Common foods that cause symptoms include: ? Tomatoes. ? Oranges, lemons, and limes. ? Peppers. ? Spicy food. ? Onions and garlic. ? Vinegar. Lifestyle  Maintain a healthy weight. Ask your doctor what weight is healthy for you. If you need to lose weight, work with your doctor to do so safely.  Exercise for at least 30 minutes for 5 or more days each week, or as told by your doctor.  Wear loose-fitting clothes.  Do not smoke. If you need help quitting, ask your doctor.  Sleep with the head of your bed higher than your feet. Use a wedge under the mattress or blocks under the bed frame to raise the head of the bed. Summary  When you have gastroesophageal reflux disease (GERD), food and lifestyle choices are very important in easing your  symptoms.  Eat small meals often instead of 3 large meals a day. Eat your meals slowly, and in a place where you are relaxed.  Limit high-fat foods such as fatty meat or fried foods.  Avoid bending over or lying down until 2-3 hours after eating.  Avoid peppermint and spearmint, caffeine, alcohol, and chocolate. This information is not intended to replace advice given to you by your health care provider. Make sure you discuss any questions you have with your health care provider. Document Revised: 03/17/2019 Document Reviewed: 12/30/2016 Elsevier Patient Education  [place].

## 2019-12-12 NOTE — Telephone Encounter (Signed)
Pt decided not to leave a message at this time.

## 2019-12-19 ENCOUNTER — Telehealth: Payer: Self-pay | Admitting: Family

## 2019-12-19 NOTE — Telephone Encounter (Signed)
Patient scheduled with Gillian Shields for 1/20. Lam for pt to change appt and call back

## 2019-12-20 ENCOUNTER — Telehealth: Payer: Self-pay | Admitting: Critical Care Medicine

## 2019-12-20 ENCOUNTER — Other Ambulatory Visit: Payer: Self-pay | Admitting: Critical Care Medicine

## 2019-12-20 ENCOUNTER — Encounter: Payer: Self-pay | Admitting: Cardiology

## 2019-12-20 ENCOUNTER — Ambulatory Visit (INDEPENDENT_AMBULATORY_CARE_PROVIDER_SITE_OTHER): Payer: Medicare Other | Admitting: Cardiology

## 2019-12-20 ENCOUNTER — Other Ambulatory Visit: Payer: Self-pay

## 2019-12-20 VITALS — BP 118/72 | HR 85 | Ht 62.0 in | Wt 134.0 lb

## 2019-12-20 DIAGNOSIS — D500 Iron deficiency anemia secondary to blood loss (chronic): Secondary | ICD-10-CM | POA: Diagnosis not present

## 2019-12-20 DIAGNOSIS — R0602 Shortness of breath: Secondary | ICD-10-CM

## 2019-12-20 DIAGNOSIS — J449 Chronic obstructive pulmonary disease, unspecified: Secondary | ICD-10-CM

## 2019-12-20 MED ORDER — FUROSEMIDE 40 MG PO TABS: 40.0000 mg | ORAL_TABLET | Freq: Every day | ORAL | 3 refills | Status: DC

## 2019-12-20 MED ORDER — ALBUTEROL SULFATE HFA 108 (90 BASE) MCG/ACT IN AERS: 2.0000 | INHALATION_SPRAY | RESPIRATORY_TRACT | 11 refills | Status: DC | PRN

## 2019-12-20 MED ORDER — FLUTICASONE-SALMETEROL 250-50 MCG/DOSE IN AEPB: 1.0000 | INHALATION_SPRAY | Freq: Two times a day (BID) | RESPIRATORY_TRACT | 11 refills | Status: DC

## 2019-12-20 NOTE — Telephone Encounter (Signed)
Spoke with patient. Informed her that the changes have been sent in. Patient expressed understanding.   Nothing further needed at this time.

## 2019-12-20 NOTE — Patient Instructions (Signed)
Medication Instructions:  Your physician has recommended you make the following change in your medication:  Start: Furosemide 40 MG Daily   *If you need a refill on your cardiac medications before your next appointment, please call your pharmacy*  Lab Work: BMP  Pro BNP   If you have labs (blood work) drawn today and your tests are completely normal, you will receive your results only by: Marland Kitchen MyChart Message (if you have MyChart) OR . A paper copy in the mail If you have any lab test that is abnormal or we need to change your treatment, we will call you to review the results.  Testing/Procedures: Your physician has requested that you have an echocardiogram. Echocardiography is a painless test that uses sound waves to create images of your heart. It provides your doctor with information about the size and shape of your heart and how well your heart's chambers and valves are working. This procedure takes approximately one hour. There are no restrictions for this procedure.   Follow-Up: At [facility], you and your health needs are our priority.  As part of our continuing mission to provide you with exceptional heart care, we have created designated Provider Care Teams.  These Care Teams include your primary Cardiologist (physician) and Advanced Practice Providers (APPs -  Physician Assistants and Nurse Practitioners) who all work together to provide you with the care you need, when you need it.  Your next appointment:   2 week(s)  The format for your next appointment:   In Person  Provider:   Norman Herrlich, MD  Thomasene Ripple, DO   Other Instructions

## 2019-12-20 NOTE — Progress Notes (Signed)
Cardiology Office Note:    Date:  12/20/2019   ID:  Leslie Sanders, DOB November 29, 1976, MRN [id number]  PCP:  Lynn Ito, MD  Cardiologist:  Norman Herrlich, MD    Referring MD: Lynn Ito, MD    ASSESSMENT:    1. SOB (shortness of breath)   2. Chronic obstructive pulmonary disease, unspecified COPD type (HCC)   3. Iron deficiency anemia due to chronic blood loss    PLAN:    In order of problems listed above:  1. Her shortness of breath is disproportionate clinically appears to have decompensated heart failure start a loop diuretic check proBNP renal function repeat a CBC with a history of anemia and recheck the echocardiogram in our office looking carefully for indices of systolic diastolic function and pulmonary pressure.  I cannot make an adequate diagnosis from the report that submitted in the chart by an outside service.  May need to consider cardiac CTA or even hemodynamic heart catheterization depending on the findings of proBNP and echocardiogram 2. Shortness of breath is disproportionate to lung disease cigarette smoking history continue bronchodilators 3. Check a CBC   Next appointment: 2 weeks in the office   Medication Adjustments/Labs and Tests Ordered: Current medicines are reviewed at length with the patient today.  Concerns regarding medicines are outlined above.  No orders of the defined types were placed in this encounter.  No orders of the defined types were placed in this encounter.   Chief Complaint  Patient presents with  . Follow-up    sob, arm pain x2 weeks     History of Present Illness:    Leslie Sanders is a 44 y.o. female with a hx of chest pain, SOB, HTN  last seen 08/30/2019.She was initially referred by her PCP for shortness of breath. EKG 08/02/19 without acute findings. Echo done 06/17/19 with Ef 55% and no acute findings. She was recommended for Lexiscan due to history of tobacco abuse, HTN, and atypical chest pain/SOB.  Lexiscan 08/05/19 Ef 63%,  low risk study, no ischemia nor infarction.    Subsequently has been seen in consultation by [facility].  Of the noted she has a 46-year cigarette smoking pack history.  Pulmonary function test were performed which showed moderate impairment with significant reversibility with bronchodilator sniff test showed no evidence of diaphragmatic impairment and lower extremity venous ultrasound 09/30/2019 without findings of DVT.  Conclusion of the pulmonary physician Dr. Karie Fetch was that her shortness of breath was due to uncontrolled obstructive lung disease she was continued on Symbicort Spiriva was added and albuterol as needed.  Compliance with diet, lifestyle and medications: Yes but she is out of a diuretic  She has increasing shortness of breath at rest and activity sleeps on multiple pillows and is having PND.  She also has peripheral edema she had been on a diuretic and tells me when she is in skilled nursing a physician told her she had heart failure.  Her echocardiogram was done by an outside service and I cannot visualize it.  I think clinically she has congestive heart failure and restarted diuretic and recheck an echocardiogram looking for findings of either severe pulmonary artery hypertension or left heart failure.  Back in the office in 2 weeks for follow-up.  He is using bronchodilators and is unimproved she is not wheezing. Past Medical History:  Diagnosis Date  . Anemia    (a) chronic blood loss hx of menorrhagia s/p endometrial biopsy 04/2015 (b) iron deficient  .  Anxiety   . B12 deficiency   . Bipolar 1 disorder (HCC)   . Cervical spinal stenosis   . Cervical spondylosis without myelopathy 07/08/2016  . Chronic pain of right thumb 04/28/2018  . Depression   . GERD (gastroesophageal reflux disease)   . Hemorrhoids    Pt with constipation secondary to iron supplement. Pt has had rectal bleed secondary to internal hemorrhoids. Pt is s/p EGD/colonoscopy 05/2012  . 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 Migraine   . PONV (postoperative nausea and vomiting)   . PTSD (post-traumatic stress disorder)   . Seasonal allergies   . Vitamin E deficiency   . Zinc deficiency     Past Surgical History:  Procedure Laterality Date  . ABDOMINAL HYSTERECTOMY    . BREAST LUMPECTOMY Bilateral   . BREAST REDUCTION SURGERY    . Carpal Metacarpal Arthroplasty Right 05/13/2019  . CHOLECYSTECTOMY  1990  . HAND SURGERY    . HYSTERECTOMY ABDOMINAL WITH SALPINGECTOMY  11/2017  . POSTERIOR CERVICAL LAMINECTOMY Bilateral 05/23/2019  . ROUX-EN-Y GASTRIC BYPASS  2003   in [place]  . SPINAL FUSION  05/23/2019    Current Medications: Current Meds  Medication Sig  . albuterol (VENTOLIN HFA) 108 (90 Base) MCG/ACT inhaler Inhale 2 puffs into the lungs every 4 (four) hours as needed for wheezing or shortness of breath.  . budesonide-formoterol (SYMBICORT) 160-4.5 MCG/ACT inhaler Inhale 2 puffs into the lungs 2 (two) times daily.  . cyanocobalamin (,VITAMIN B-12,) 1000 MCG/ML injection Inject 1 mL into the muscle every 30 (thirty) days.  Marland Kitchen docusate sodium (COLACE) 100 MG capsule Take 1 capsule (100 mg total) by mouth 2 (two) times daily as needed for mild constipation or moderate constipation.  . DULoxetine (CYMBALTA) 60 MG capsule Take 1 capsule by mouth daily.  . famotidine (PEPCID) 20 MG tablet Take 1 tablet (20 mg total) by mouth 2 (two) times daily.  . ferrous sulfate 325 (65 FE) MG tablet Take 1 tablet (325 mg total) by mouth 2 (two) times daily with a meal.  . fluticasone (FLONASE) 50 MCG/ACT nasal spray Place 2 sprays into both nostrils daily.  . Fluticasone-Salmeterol (ADVAIR DISKUS) 250-50 MCG/DOSE AEPB Inhale 1 puff into the lungs 2 (two) times daily.  . folic acid (FOLVITE) 1 MG tablet Take 1 tablet by mouth daily.  . hydrOXYzine (ATARAX/VISTARIL) 50 MG tablet Take 1 tablet (50 mg total) by mouth at bedtime as needed (for sleep).  Marland Kitchen levalbuterol (XOPENEX HFA) 45 MCG/ACT  inhaler Inhale 2 puffs into the lungs every 6 (six) hours as needed.  . lidocaine (LIDODERM) 5 % Place 1 patch onto the skin.  Marland Kitchen lidocaine (XYLOCAINE) 5 % ointment Apply topically as needed.  . montelukast (SINGULAIR) 10 MG tablet Take 10 mg by mouth at bedtime.  . pregabalin (LYRICA) 100 MG capsule Take 1 capsule by mouth daily.  . Tiotropium Bromide Monohydrate (SPIRIVA RESPIMAT) 2.5 MCG/ACT AERS Inhale 1 puff into the lungs daily.  . traZODone (DESYREL) 50 MG tablet Take 1 tablet by mouth as needed.  . Vitamin D, Ergocalciferol, (DRISDOL) 1.25 MG (50000 UT) CAPS capsule Take 1 capsule by mouth once a week.     Allergies:   Tylenol [acetaminophen], Adhesive  [tape], Hydrocodone-acetaminophen, Nsaids, and Latex   Social History   Socioeconomic History  . Marital status: Divorced    Spouse name: Not on file  . Number of children: Not on file  . Years of education: Not on file  .  Highest education level: Not on file  Occupational History  . Not on file  Tobacco Use  . Smoking status: Former Smoker    Packs/day: 2.00    Years: 23.00    Pack years: 46.00    Types: Cigarettes    Quit date: 12/08/2018    Years since quitting: 1.0  . Smokeless tobacco: Never Used  Substance and Sexual Activity  . Alcohol use: Not Currently    Alcohol/week: 28.0 standard drinks    Types: 28 Cans of beer per week    Comment: Nothing in 4 years  . Drug use: No  . Sexual activity: Not Currently  Other Topics Concern  . Not on file  Social History Narrative  . Not on file   Social Determinants of Health   Financial Resource Strain:   . Difficulty of Paying Living Expenses: Not on file  Food Insecurity:   . Worried About Programme researcher, broadcasting/film/video in the Last Year: Not on file  . Ran Out of Food in the Last Year: Not on file  Transportation Needs:   . Lack of Transportation (Medical): Not on file  . Lack of Transportation (Non-Medical): Not on file  Physical Activity:   . Days of Exercise per Week:  Not on file  . Minutes of Exercise per Session: Not on file  Stress:   . Feeling of Stress : Not on file  Social Connections:   . Frequency of Communication with Friends and Family: Not on file  . Frequency of Social Gatherings with Friends and Family: Not on file  . Attends Religious Services: Not on file  . Active Member of Clubs or Organizations: Not on file  . Attends Banker Meetings: Not on file  . Marital Status: Not on file     Family History: The patient's family history includes ADD / ADHD in her son; Arthritis/Rheumatoid in her mother; Breast cancer in her mother; COPD in her maternal grandmother and paternal grandmother; HIV in her father; Lupus in her mother; ODD in her son; Rheum arthritis in her maternal grandmother; Schizophrenia in her maternal aunt. ROS:   Please see the history of present illness.    All other systems reviewed and are negative.  EKGs/Labs/Other Studies Reviewed:    The following studies were reviewed today:  CXR 10/2018 FINDINGS: The heart size and mediastinal contours are within normal limits. Both lungs are clear. Cervical collar artifact is noted with new fixation hardware noted across the C7-T1 interspace, partially included. No complicating features or hardware loosening identified. No acute nor suspicious osseous lesions. Numerous ventral surgical clips again demonstrated along the abdomen.  IMPRESSION: No active cardiopulmonary disease.  Lexiscan 08/05/19  Nuclear stress EF: 63%.  There was no ST segment deviation noted during stress.  The study is normal.  This is a low risk study.  The left ventricular ejection fraction is normal (55-65%). Normal stress nuclear study with no ischemia or infarction. Gated ejection fraction 63% with normal wall motion.  Echo 06/17/19 at [facility] 1. Technically fair study 2. No gross structural valvular abnormalities 3. No chamber enlargement 4. LV systolic  function normal with EF 55% 6. No evidence intracardiac mass or thrombus 7. No pericardial effusion Recent Labs:  07/25/2019 NP level was quite low at 87 not in range to raise concern of heart failure  08/30/2019: Hemoglobin 11.2; Platelets 336 09/26/2019: BUN 8; Creatinine, Ser 0.55; Potassium 4.1; Sodium 141  Recent Lipid Panel    Component  Value Date/Time   CHOL 173 11/29/2016 0644   TRIG 84 11/29/2016 0644   HDL 88 11/29/2016 0644   CHOLHDL 2.0 11/29/2016 0644   VLDL 17 11/29/2016 0644   LDLCALC 68 11/29/2016 0644    Physical Exam:    VS:  BP 118/72 (BP Location: Left Arm, Patient Position: Sitting, Cuff Size: Normal)   Pulse 85   Ht 5\' 2"  (1.575 m)   Wt 134 lb (60.8 kg)   LMP 10/07/2016   SpO2 99%   BMI 24.51 kg/m     Wt Readings from Last 3 Encounters:  12/20/19 134 lb (60.8 kg)  10/31/19 134 lb 6.4 oz (61 kg)  09/26/19 128 lb 6.4 oz (58.2 kg)     GEN: Breathless at rest well nourished, well developed in no acute distress HEENT: Normal NECK: No JVD; No carotid bruits LYMPHATICS: No lymphadenopathy CARDIAC: Soft S1 RRR, no murmurs, rubs, gallops to seems accentuated RESPIRATORY:  Clear to auscultation without rales, wheezing or rhonchi  ABDOMEN: Soft, non-tender, non-distended MUSCULOSKELETAL: 1-2+ bilateral lower extremity edema; No deformity  SKIN: Warm and dry NEUROLOGIC:  Alert and oriented x 3 PSYCHIATRIC:  Normal affect    Signed, Shirlee More, MD  12/20/2019 3:48 PM    [facility]

## 2019-12-20 NOTE — Progress Notes (Signed)
Due to insurance issues, switching Symbicort to Advair. Patient will be notified of change.  Steffanie Dunn, DO 12/20/19 12:30 PM [facility]

## 2019-12-21 ENCOUNTER — Telehealth: Payer: Self-pay | Admitting: Emergency Medicine

## 2019-12-21 LAB — CBC
Hematocrit: 34.8 % (ref 34.0–46.6)
Hemoglobin: 11 g/dL — ABNORMAL LOW (ref 11.1–15.9)
MCH: 28 pg (ref 26.6–33.0)
MCHC: 31.6 g/dL (ref 31.5–35.7)
MCV: 89 fL (ref 79–97)
Platelets: 406 10*3/uL (ref 150–450)
RBC: 3.93 x10E6/uL (ref 3.77–5.28)
RDW: 12.2 % (ref 11.7–15.4)
WBC: 6.7 10*3/uL (ref 3.4–10.8)

## 2019-12-21 LAB — BASIC METABOLIC PANEL
BUN/Creatinine Ratio: 13 (ref 9–23)
BUN: 7 mg/dL (ref 6–24)
CO2: 24 mmol/L (ref 20–29)
Calcium: 8.6 mg/dL — ABNORMAL LOW (ref 8.7–10.2)
Chloride: 107 mmol/L — ABNORMAL HIGH (ref 96–106)
Creatinine, Ser: 0.56 mg/dL — ABNORMAL LOW (ref 0.57–1.00)
GFR calc Af Amer: 131 mL/min/{1.73_m2} (ref >59)
GFR calc non Af Amer: 114 mL/min/{1.73_m2} (ref >59)
Glucose: 90 mg/dL (ref 65–99)
Potassium: 3.9 mmol/L (ref 3.5–5.2)
Sodium: 143 mmol/L (ref 134–144)

## 2019-12-21 LAB — PRO B NATRIURETIC PEPTIDE: NT-Pro BNP: 60 pg/mL (ref 0–130)

## 2019-12-21 NOTE — Telephone Encounter (Signed)
Left message with lab results on patient's voicemail per dpr. Advised patient to call with any questions.

## 2019-12-22 ENCOUNTER — Encounter (HOSPITAL_BASED_OUTPATIENT_CLINIC_OR_DEPARTMENT_OTHER): Payer: Self-pay

## 2019-12-22 ENCOUNTER — Emergency Department (HOSPITAL_BASED_OUTPATIENT_CLINIC_OR_DEPARTMENT_OTHER)
Admission: EM | Admit: 2019-12-22 | Discharge: 2019-12-22 | Disposition: A | Discharge status: Home / Self Care | Payer: Medicare Other | Source: Home / Self Care | Attending: Emergency Medicine | Admitting: Emergency Medicine

## 2019-12-22 ENCOUNTER — Other Ambulatory Visit: Payer: Self-pay

## 2019-12-22 ENCOUNTER — Emergency Department (HOSPITAL_BASED_OUTPATIENT_CLINIC_OR_DEPARTMENT_OTHER): Payer: Medicare Other

## 2019-12-22 ENCOUNTER — Ambulatory Visit (HOSPITAL_BASED_OUTPATIENT_CLINIC_OR_DEPARTMENT_OTHER)
Admission: RE | Admit: 2019-12-22 | Discharge: 2019-12-22 | Disposition: A | Discharge status: Home / Self Care | Payer: Medicare Other | Source: Ambulatory Visit | Attending: Family | Admitting: Family

## 2019-12-22 DIAGNOSIS — D500 Iron deficiency anemia secondary to blood loss (chronic): Secondary | ICD-10-CM | POA: Diagnosis not present

## 2019-12-22 DIAGNOSIS — Z886 Allergy status to analgesic agent status: Secondary | ICD-10-CM | POA: Insufficient documentation

## 2019-12-22 DIAGNOSIS — R0602 Shortness of breath: Secondary | ICD-10-CM

## 2019-12-22 DIAGNOSIS — Z9884 Bariatric surgery status: Secondary | ICD-10-CM | POA: Insufficient documentation

## 2019-12-22 DIAGNOSIS — Z885 Allergy status to narcotic agent status: Secondary | ICD-10-CM | POA: Insufficient documentation

## 2019-12-22 DIAGNOSIS — J449 Chronic obstructive pulmonary disease, unspecified: Secondary | ICD-10-CM | POA: Insufficient documentation

## 2019-12-22 DIAGNOSIS — Z91048 Other nonmedicinal substance allergy status: Secondary | ICD-10-CM | POA: Insufficient documentation

## 2019-12-22 DIAGNOSIS — Z87891 Personal history of nicotine dependence: Secondary | ICD-10-CM | POA: Insufficient documentation

## 2019-12-22 DIAGNOSIS — R42 Dizziness and giddiness: Secondary | ICD-10-CM | POA: Insufficient documentation

## 2019-12-22 DIAGNOSIS — Z91040 Latex allergy status: Secondary | ICD-10-CM | POA: Insufficient documentation

## 2019-12-22 LAB — CBC WITH DIFFERENTIAL/PLATELET
Abs Immature Granulocytes: 0.02 10*3/uL (ref 0.00–0.07)
Basophils Absolute: 0 10*3/uL (ref 0.0–0.1)
Basophils Relative: 0 %
Eosinophils Absolute: 0.1 10*3/uL (ref 0.0–0.5)
Eosinophils Relative: 1 %
HCT: 34.9 % — ABNORMAL LOW (ref 36.0–46.0)
Hemoglobin: 11.2 g/dL — ABNORMAL LOW (ref 12.0–15.0)
Immature Granulocytes: 0 %
Lymphocytes Relative: 33 %
Lymphs Abs: 2.4 10*3/uL (ref 0.7–4.0)
MCH: 28.4 pg (ref 26.0–34.0)
MCHC: 32.1 g/dL (ref 30.0–36.0)
MCV: 88.6 fL (ref 80.0–100.0)
Monocytes Absolute: 0.6 10*3/uL (ref 0.1–1.0)
Monocytes Relative: 8 %
Neutro Abs: 4.3 10*3/uL (ref 1.7–7.7)
Neutrophils Relative %: 58 %
Platelets: 400 10*3/uL (ref 150–400)
RBC: 3.94 MIL/uL (ref 3.87–5.11)
RDW: 13.3 % (ref 11.5–15.5)
WBC: 7.4 10*3/uL (ref 4.0–10.5)
nRBC: 0 % (ref 0.0–0.2)

## 2019-12-22 LAB — TROPONIN I (HIGH SENSITIVITY): Troponin I (High Sensitivity): <2 ng/L (ref <18)

## 2019-12-22 LAB — BASIC METABOLIC PANEL
Anion gap: 10 (ref 5–15)
BUN: 9 mg/dL (ref 6–20)
CO2: 21 mmol/L — ABNORMAL LOW (ref 22–32)
Calcium: 8.9 mg/dL (ref 8.9–10.3)
Chloride: 108 mmol/L (ref 98–111)
Creatinine, Ser: 0.56 mg/dL (ref 0.44–1.00)
GFR calc Af Amer: >60 mL/min (ref >60)
GFR calc non Af Amer: >60 mL/min (ref >60)
Glucose, Bld: 106 mg/dL — ABNORMAL HIGH (ref 70–99)
Potassium: 3.4 mmol/L — ABNORMAL LOW (ref 3.5–5.1)
Sodium: 139 mmol/L (ref 135–145)

## 2019-12-22 MED ORDER — PREDNISONE 20 MG PO TABS: 40.0000 mg | ORAL_TABLET | Freq: Every day | ORAL | 0 refills | Status: AC

## 2019-12-22 MED ORDER — HYDROXYZINE HCL 25 MG PO TABS: 25.0000 mg | ORAL_TABLET | Freq: Four times a day (QID) | ORAL | 0 refills | Status: DC | PRN

## 2019-12-22 NOTE — Progress Notes (Signed)
  Echocardiogram 2D Echocardiogram has been performed.  Leslie Sanders 12/22/2019, 11:46 AM

## 2019-12-22 NOTE — ED Notes (Signed)
Refused IV access

## 2019-12-22 NOTE — Discharge Instructions (Signed)
You are seen in the emergency department today with shortness of breath.  I am starting a steroid burst over the next 5 days and have refilled your prescription for Atarax to take as needed for sleep/anxiety symptoms.  Please call your primary care doctor to schedule a follow-up appointment.  Keep your follow-up appointment with your cardiologist as scheduled.  Return to the emergency department any new or suddenly worsening symptoms.

## 2019-12-22 NOTE — ED Notes (Signed)
Per Dr. Jacqulyn Bath, pt does not need repeat troponin. Will discontinue.

## 2019-12-22 NOTE — ED Provider Notes (Signed)
Emergency Department Provider Note   I have reviewed the triage vital signs and the nursing notes.   HISTORY  Chief Complaint Shortness of Breath   HPI Leslie Sanders is a 44 y.o. female with PMH reviewed below presents to the emergency department for evaluation of shortness of breath with lightheaded/dizzy feeling and pain in the left arm.  Patient describes intermittent symptoms similar to this for the past several months.  She states that she has been following with her primary care doctor, pulmonologist and was seen today for an echo with Cardiology.  She tells me that to this point no clear reason for her symptoms has been found.  She reports history of panic attack but tells me that today she became concerned because she was driving her car in addition to her shortness of breath and left arm pain she was feeling lightheaded.  She tells me that she initially drove over to be seen in the same building as her PCP but was referred to the emergency department for further evaluation. Denies fever/chills. No sick contacts.   Past Medical History:  Diagnosis Date  . Anemia    (a) chronic blood loss hx of menorrhagia s/p endometrial biopsy 04/2015 (b) iron deficient  . Anxiety   . B12 deficiency   . Bipolar 1 disorder (HCC)   . Cervical spinal stenosis   . Cervical spondylosis without myelopathy 07/08/2016  . Chronic pain of right thumb 04/28/2018  . Depression   . GERD (gastroesophageal reflux disease)   . Hemorrhoids    Pt with constipation secondary to iron supplement. Pt has had rectal bleed secondary to internal hemorrhoids. Pt is s/p EGD/colonoscopy 05/2012  . 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 Migraine   . PONV (postoperative nausea and vomiting)   . PTSD (post-traumatic stress disorder)   . Seasonal allergies   . Vitamin E deficiency   . Zinc deficiency     Patient Active Problem List   Diagnosis Date Noted  . Atypical chest pain 08/02/2019  . History of  smoking 08/02/2019  . Dyspnea on exertion 08/02/2019  . Abnormal EKG 07/19/2019  . Major depression, recurrent (HCC) 11/27/2016  . Bipolar disorder, most recent episode depressed (HCC) 10/12/2016  . Anemia, iron deficiency 10/12/2016  . Alcohol use disorder, moderate, dependence (HCC) 10/12/2016  . Lactose intolerance 10/12/2016    Past Surgical History:  Procedure Laterality Date  . ABDOMINAL HYSTERECTOMY    . BREAST LUMPECTOMY Bilateral   . BREAST REDUCTION SURGERY    . Carpal Metacarpal Arthroplasty Right 05/13/2019  . CHOLECYSTECTOMY  1990  . HAND SURGERY    . HYSTERECTOMY ABDOMINAL WITH SALPINGECTOMY  11/2017  . POSTERIOR CERVICAL LAMINECTOMY Bilateral 05/23/2019  . ROUX-EN-Y GASTRIC BYPASS  2003   in [place]  . SPINAL FUSION  05/23/2019    Allergies Tylenol [acetaminophen], Adhesive  [tape], Hydrocodone-acetaminophen, Nsaids, and Latex  Family History  Problem Relation Age of Onset  . Schizophrenia Maternal Aunt   . ADD / ADHD Son   . ODD Son   . Breast cancer Mother   . Arthritis/Rheumatoid Mother   . Lupus Mother   . HIV Father   . Rheum arthritis Maternal Grandmother   . COPD Maternal Grandmother   . COPD Paternal Grandmother     Social History Social History   Tobacco Use  . Smoking status: Former Smoker    Packs/day: 2.00    Years: 23.00    Pack years: 46.00  Types: Cigarettes    Quit date: 12/08/2018    Years since quitting: 1.0  . Smokeless tobacco: Never Used  Substance Use Topics  . Alcohol use: Not Currently    Alcohol/week: 28.0 standard drinks    Types: 28 Cans of beer per week    Comment: Nothing in 4 years  . Drug use: No    Review of Systems  Constitutional: No fever/chills. Positive lightheadedness.  Eyes: No visual changes. ENT: No sore throat. Cardiovascular: Denies chest pain. Respiratory: Positive shortness of breath. Gastrointestinal: No abdominal pain.  No nausea, no vomiting.  No diarrhea.  No constipation. Genitourinary:  Negative for dysuria. Musculoskeletal: Negative for back pain. Positive left arm pain.  Skin: Negative for rash. Neurological: Negative for headaches, focal weakness or numbness.  10-point ROS otherwise negative.  ____________________________________________   PHYSICAL EXAM:  VITAL SIGNS: ED Triage Vitals  Enc Vitals Group     BP 12/22/19 1927 135/87     Pulse Rate 12/22/19 1927 71     Resp 12/22/19 1927 19     Temp 12/22/19 1927 98.2 F (36.8 C)     Temp Source 12/22/19 1927 Oral     SpO2 12/22/19 1927 100 %   Constitutional: Alert and oriented. Rapid breathing but conversational. Eyes: Conjunctivae are normal. Head: Atraumatic. Nose: No congestion/rhinnorhea. Mouth/Throat: Mucous membranes are moist.   Neck: No stridor.  Cardiovascular: Normal rate, regular rhythm. Good peripheral circulation. Grossly normal heart sounds.   Respiratory: Increased respiratory rate but able to provide a full history and speaking in complete sentences.  No retractions. Lungs CTAB. No wheezing.  Gastrointestinal: Soft and nontender. No distention.  Musculoskeletal: No lower extremity tenderness nor edema. No gross deformities of extremities. Neurologic:  Normal speech and language. No gross focal neurologic deficits are appreciated.  Skin:  Skin is warm, dry and intact. No rash noted.   ____________________________________________   LABS (all labs ordered are listed, but only abnormal results are displayed)  Labs Reviewed  BASIC METABOLIC PANEL - Abnormal; Notable for the following components:      Result Value   Potassium 3.4 (*)    CO2 21 (*)    Glucose, Bld 106 (*)    All other components within normal limits  CBC WITH DIFFERENTIAL/PLATELET - Abnormal; Notable for the following components:   Hemoglobin 11.2 (*)    HCT 34.9 (*)    All other components within normal limits  TROPONIN I (HIGH SENSITIVITY)   ____________________________________________  EKG   EKG Interpretation   Date/Time:  Thursday December 22 2019 19:23:07 EST Ventricular Rate:  65 PR Interval:  144 QRS Duration: 82 QT Interval:  420 QTC Calculation: 436 R Axis:   52 Text Interpretation: Normal sinus rhythm Cannot rule out Anterior infarct , age undetermined Abnormal ECG No STEMI Confirmed by Nanda Quinton [phone]) on 12/22/2019 7:32:53 PM       ____________________________________________  RADIOLOGY  CXR reviewed.  ____________________________________________   PROCEDURES  Procedure(s) performed:   Procedures  None ____________________________________________   INITIAL IMPRESSION / ASSESSMENT AND PLAN / ED COURSE  Pertinent labs & imaging results that were available during my care of the patient were reviewed by me and considered in my medical decision making (see chart for details).   Patient presents to the emergency department with shortness of breath, left arm pain, lightheadedness.  Interview of the past notes available the patient is followed with [facility] and Cardiology.  She does have history of chronic asthma but this is thought  less likely to be causing her symptoms.  She has had negative Dopplers along with a negative CT angio of the chest looking for PE.  She underwent diaphragmatic testing.  ABG has been normal.  She has had pulmonary function testing and went for echo today but results are pending.  Most recent pulmonology note from 1/4 describes dyspnea most likely related to obstructive lung disease.   Patient has no active chest pain.  She does not appear volume overloaded clinically.  I will repeat lab work and add troponin along with repeat chest x-ray.  Patient did have labs performed 2 days ago which were largely unremarkable.  I do not see utility in repeating test such as CTA at this time looking for PE.   Labs and imaging reviewed. Plan for discharge with close PCP follow up. Will start prednisone but no wheezing on exam. Plan for continued Pulmonary  and Cardiology follow up.  ____________________________________________  FINAL CLINICAL IMPRESSION(S) / ED DIAGNOSES  Final diagnoses:  SOB (shortness of breath)     NEW OUTPATIENT MEDICATIONS STARTED DURING THIS VISIT:  Discharge Medication List as of 12/22/2019  9:50 PM    START taking these medications   Details  predniSONE (DELTASONE) 20 MG tablet Take 2 tablets (40 mg total) by mouth daily for 5 days., Starting Thu 12/22/2019, Until Tue 12/27/2019, Print        Note:  This document was prepared using Dragon voice recognition software and may include unintentional dictation errors.  Alona Bene, MD, [facility]    Anslie Spadafora, Arlyss Repress, MD 12/25/19 1949

## 2019-12-22 NOTE — ED Notes (Signed)
ED Provider at bedside. 

## 2019-12-22 NOTE — ED Notes (Signed)
Upon RN entrance to room, pt on cell phone and breathing easily and at regular rate. When pt noted RN, she began to increase her respiratory rate and breathe shakily as if she is struggling to breathe.

## 2019-12-22 NOTE — ED Triage Notes (Signed)
Pt c/o SOB,dizziness x months -being followed by cardiology and pulmonology-pt c/o pain to left UE x 1 month-pt hyperventilating-was encouraged to take slow deep breaths

## 2019-12-28 ENCOUNTER — Ambulatory Visit: Payer: Medicare Other | Admitting: Family

## 2020-01-05 ENCOUNTER — Ambulatory Visit: Payer: Medicare Other | Admitting: Cardiology

## 2020-01-06 ENCOUNTER — Other Ambulatory Visit: Payer: Self-pay

## 2020-01-06 MED ORDER — ALBUTEROL SULFATE HFA 108 (90 BASE) MCG/ACT IN AERS: 1.0000 | INHALATION_SPRAY | Freq: Four times a day (QID) | RESPIRATORY_TRACT | 11 refills | Status: DC | PRN

## 2020-01-09 ENCOUNTER — Ambulatory Visit (INDEPENDENT_AMBULATORY_CARE_PROVIDER_SITE_OTHER): Payer: Medicare Other | Admitting: Cardiology

## 2020-01-09 ENCOUNTER — Other Ambulatory Visit: Payer: Self-pay

## 2020-01-09 ENCOUNTER — Encounter: Payer: Self-pay | Admitting: Cardiology

## 2020-01-09 VITALS — BP 104/72 | HR 90 | Ht 62.0 in | Wt 131.0 lb

## 2020-01-09 DIAGNOSIS — R0789 Other chest pain: Secondary | ICD-10-CM | POA: Diagnosis not present

## 2020-01-09 DIAGNOSIS — R0602 Shortness of breath: Secondary | ICD-10-CM

## 2020-01-09 DIAGNOSIS — Z01812 Encounter for preprocedural laboratory examination: Secondary | ICD-10-CM

## 2020-01-09 DIAGNOSIS — Z131 Encounter for screening for diabetes mellitus: Secondary | ICD-10-CM

## 2020-01-09 DIAGNOSIS — I10 Essential (primary) hypertension: Secondary | ICD-10-CM | POA: Diagnosis not present

## 2020-01-09 MED ORDER — ASPIRIN EC 81 MG PO TBEC: 81.0000 mg | DELAYED_RELEASE_TABLET | Freq: Every day | ORAL | 3 refills | Status: DC

## 2020-01-09 MED ORDER — NITROGLYCERIN 0.4 MG SL SUBL: 0.4000 mg | SUBLINGUAL_TABLET | SUBLINGUAL | 3 refills | Status: DC | PRN

## 2020-01-09 NOTE — H&P (View-Only) (Signed)
Cardiology Office Note:    Date:  01/09/2020   ID:  Leslie Sanders, DOB Jun 30, 1976, MRN [id number]  PCP:  Lynn Ito, MD  Cardiologist:  Gypsy Balsam, MD  Electrophysiologist:  None   Referring MD: Lynn Ito, MD   Follow-up for worsening shortness of breath .  History of Present Illness:    Leslie Sanders is a 44 y.o. female with a hx of B12 deficiency, former smoker, on no family history due to being raised in foster care, hypertension and diastolic dysfunction presents today to be evaluated for worsening shortness of breath and worsening chest pain.  The patient has been evaluated over the last several months for shortness of breath.  She was diagnosed with obstructive lung disease which she has been placed on Symbicort and Spiriva albuterol as was added recently and she continues to get worse.  In addition she has seen been seen previously for her symptoms and had a pharmacologic nuclear stress test in August 2020 which was reported as normal.  Recently she did see Dr. Dulce Sellar on December 20, 2019 at which time a transthoracic echocardiogram was done which showed normal LV EF, present of diastolic dysfunction, normal pulmonary artery pressures with no valvular abnormalities.  The patient is here today and she tells me that her symptoms have gotten worse where she is unable to walk 1 foot from her bed to her door into her kitchen without becoming significantly short of breath.  She notes that this is interfering with her daily of daily living.  She tells me that she used to experience left-sided chest pain maybe once or twice a month but since she saw Dr. Dulce Sellar this has increased to about every other day.  She describes the chest pain as a left-sided tightness that is slow in onset get worse within seconds and radiates to her left shoulder and down her arm.  She states after this her left arm is significantly numb in the last 4 several minutes at a time.  At first the shortness of breath  with the more bothersome symptoms but now the intermittent closely recurrent chest pain has become a big problem for her.  As the patient tells me about her symptoms she is in tears as she reports that both symptoms have become debilitating.  No other complaints at this time.   Past Medical History:  Diagnosis Date  . Anemia    (a) chronic blood loss hx of menorrhagia s/p endometrial biopsy 04/2015 (b) iron deficient  . Anxiety   . B12 deficiency   . Bipolar 1 disorder (HCC)   . Cervical spinal stenosis   . Cervical spondylosis without myelopathy 07/08/2016  . Chronic pain of right thumb 04/28/2018  . Depression   . GERD (gastroesophageal reflux disease)   . Hemorrhoids    Pt with constipation secondary to iron supplement. Pt has had rectal bleed secondary to internal hemorrhoids. Pt is s/p EGD/colonoscopy 05/2012  . 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 Migraine   . PONV (postoperative nausea and vomiting)   . PTSD (post-traumatic stress disorder)   . Seasonal allergies   . Vitamin E deficiency   . Zinc deficiency     Past Surgical History:  Procedure Laterality Date  . ABDOMINAL HYSTERECTOMY    . BREAST LUMPECTOMY Bilateral   . BREAST REDUCTION SURGERY    . Carpal Metacarpal Arthroplasty Right 05/13/2019  . CHOLECYSTECTOMY  1990  . HAND SURGERY    . HYSTERECTOMY ABDOMINAL  WITH SALPINGECTOMY  11/2017  . POSTERIOR CERVICAL LAMINECTOMY Bilateral 05/23/2019  . ROUX-EN-Y GASTRIC BYPASS  2003   in [place]  . SPINAL FUSION  05/23/2019    Current Medications: Current Meds  Medication Sig  . albuterol (PROAIR HFA) 108 (90 Base) MCG/ACT inhaler Inhale 1 puff into the lungs every 6 (six) hours as needed for wheezing or shortness of breath.  . budesonide-formoterol (SYMBICORT) 160-4.5 MCG/ACT inhaler Inhale 2 puffs into the lungs 2 (two) times daily.  . Buprenorphine 7.5 MCG/HR PTWK Place 7.5 mcg onto the skin once a week.  . cyanocobalamin (,VITAMIN B-12,) 1000 MCG/ML  injection Inject 1 mL into the muscle every 30 (thirty) days.  Marland Kitchen docusate sodium (COLACE) 100 MG capsule Take 1 capsule (100 mg total) by mouth 2 (two) times daily as needed for mild constipation or moderate constipation.  . DULoxetine (CYMBALTA) 60 MG capsule Take 1 capsule by mouth daily.  . famotidine (PEPCID) 20 MG tablet Take 1 tablet (20 mg total) by mouth 2 (two) times daily.  . ferrous sulfate 325 (65 FE) MG tablet Take 1 tablet (325 mg total) by mouth 2 (two) times daily with a meal. (Patient taking differently: Take 325 mg by mouth 3 (three) times daily. )  . fluticasone (FLONASE) 50 MCG/ACT nasal spray Place 2 sprays into both nostrils daily.  . Fluticasone-Salmeterol (ADVAIR) 250-50 MCG/DOSE AEPB Inhale 1 puff into the lungs 2 (two) times daily.  . folic acid (FOLVITE) 1 MG tablet Take 1 tablet by mouth daily.  . furosemide (LASIX) 40 MG tablet Take 1 tablet (40 mg total) by mouth daily.  . hydrOXYzine (ATARAX/VISTARIL) 25 MG tablet Take 1 tablet (25 mg total) by mouth every 6 (six) hours as needed for anxiety.  Marland Kitchen levalbuterol (XOPENEX HFA) 45 MCG/ACT inhaler Inhale 2 puffs into the lungs every 6 (six) hours as needed.  . lidocaine (XYLOCAINE) 5 % ointment Apply topically as needed.  . montelukast (SINGULAIR) 10 MG tablet Take 10 mg by mouth at bedtime.  . pregabalin (LYRICA) 100 MG capsule Take 1 capsule by mouth daily.  . Tiotropium Bromide Monohydrate (SPIRIVA RESPIMAT) 2.5 MCG/ACT AERS Inhale 1 puff into the lungs daily.  . Vitamin D, Ergocalciferol, (DRISDOL) 1.25 MG (50000 UT) CAPS capsule Take 1 capsule by mouth once a week.     Allergies:   Tylenol [acetaminophen], Adhesive  [tape], Hydrocodone-acetaminophen, Nsaids, and Latex   Social History   Socioeconomic History  . Marital status: Divorced    Spouse name: Not on file  . Number of children: Not on file  . Years of education: Not on file  . Highest education level: Not on file  Occupational History  . Not on file    Tobacco Use  . Smoking status: Former Smoker    Packs/day: 2.00    Years: 23.00    Pack years: 46.00    Types: Cigarettes    Quit date: 12/08/2018    Years since quitting: 1.0  . Smokeless tobacco: Never Used  Substance and Sexual Activity  . Alcohol use: Not Currently    Alcohol/week: 28.0 standard drinks    Types: 28 Cans of beer per week    Comment: Nothing in 4 years  . Drug use: No  . Sexual activity: Not Currently  Other Topics Concern  . Not on file  Social History Narrative  . Not on file   Social Determinants of Health   Financial Resource Strain:   . Difficulty of Paying Living Expenses: Not on  file  Food Insecurity:   . Worried About Programme researcher, broadcasting/film/videounning Out of Food in the Last Year: Not on file  . Ran Out of Food in the Last Year: Not on file  Transportation Needs:   . Lack of Transportation (Medical): Not on file  . Lack of Transportation (Non-Medical): Not on file  Physical Activity:   . Days of Exercise per Week: Not on file  . Minutes of Exercise per Session: Not on file  Stress:   . Feeling of Stress : Not on file  Social Connections:   . Frequency of Communication with Friends and Family: Not on file  . Frequency of Social Gatherings with Friends and Family: Not on file  . Attends Religious Services: Not on file  . Active Member of Clubs or Organizations: Not on file  . Attends BankerClub or Organization Meetings: Not on file  . Marital Status: Not on file     Family History: The patient's family history includes ADD / ADHD in her son; Arthritis/Rheumatoid in her mother; Breast cancer in her mother; COPD in her maternal grandmother and paternal grandmother; HIV in her father; Lupus in her mother; ODD in her son; Rheum arthritis in her maternal grandmother; Schizophrenia in her maternal aunt.  ROS:   Review of Systems  Constitution: Negative for decreased appetite, fever and weight gain.  HENT: Negative for congestion, ear discharge, hoarse voice and sore throat.    Eyes: Negative for discharge, redness, vision loss in right eye and visual halos.  Cardiovascular: Reports chest pain, dyspnea on exertion.  Negative for, leg swelling, orthopnea and palpitations.  Respiratory: Negative for cough, hemoptysis, shortness of breath and snoring.   Endocrine: Negative for heat intolerance and polyphagia.  Hematologic/Lymphatic: Negative for bleeding problem. Does not bruise/bleed easily.  Skin: Negative for flushing, nail changes, rash and suspicious lesions.  Musculoskeletal: Negative for arthritis, joint pain, muscle cramps, myalgias, neck pain and stiffness.  Gastrointestinal: Negative for abdominal pain, bowel incontinence, diarrhea and excessive appetite.  Genitourinary: Negative for decreased libido, genital sores and incomplete emptying.  Neurological: Negative for brief paralysis, focal weakness, headaches and loss of balance.  Psychiatric/Behavioral: Negative for altered mental status, depression and suicidal ideas.  Allergic/Immunologic: Negative for HIV exposure and persistent infections.    EKGs/Labs/Other Studies Reviewed:    The following studies were reviewed today:   EKG: None today  Echocardiogram December 22, 2019 IMPRESSIONS  1. Left ventricular ejection fraction, by visual estimation, is 60 to  65%. The left ventricle has normal function. There is no left ventricular  hypertrophy.  2. Left ventricular diastolic parameters are consistent with Grade I  diastolic dysfunction (impaired relaxation).  3. The left ventricle has no regional wall motion abnormalities.  4. Normal pulmonary artery systolic pressure.  5. The inferior vena cava is dilated in size with <50% respiratory  variability, suggesting right atrial pressure of 15 mmHg.   Pharmacologic nuclear stress test  Nuclear stress EF: 63%.  There was no ST segment deviation noted during stress.  The study is normal.  This is a low risk study.  The left ventricular  ejection fraction is normal (55-65%). Normal stress nuclear study with no ischemia or infarction.  Gated ejection fraction 63% with normal wall motion.   Recent Labs: 12/20/2019: NT-Pro BNP 60 12/22/2019: BUN 9; Creatinine, Ser 0.56; Hemoglobin 11.2; Platelets 400; Potassium 3.4; Sodium 139  Recent Lipid Panel    Component Value Date/Time   CHOL 173 11/29/2016 0644   TRIG 84 11/29/2016 0644  HDL 88 11/29/2016 0644   CHOLHDL 2.0 11/29/2016 0644   VLDL 17 11/29/2016 0644   LDLCALC 68 11/29/2016 0644    Physical Exam:    VS:  BP 104/72 (BP Location: Right Arm)   Pulse 90   Ht 5\' 2"  (1.575 m)   Wt 131 lb (59.4 kg)   LMP 10/07/2016   SpO2 100%   BMI 23.96 kg/m     Wt Readings from Last 3 Encounters:  01/09/20 131 lb (59.4 kg)  12/20/19 134 lb (60.8 kg)  10/31/19 134 lb 6.4 oz (61 kg)     GEN: Well nourished, well developed in no acute distress HEENT: Normal NECK: No JVD; No carotid bruits LYMPHATICS: No lymphadenopathy CARDIAC: S1S2 noted,RRR, no murmurs, rubs, gallops RESPIRATORY:  Clear to auscultation without rales, wheezing or rhonchi  ABDOMEN: Soft, non-tender, non-distended, +bowel sounds, no guarding. EXTREMITIES: No edema, No cyanosis, no clubbing MUSCULOSKELETAL:  No deformity  SKIN: Warm and dry NEUROLOGIC:  Alert and oriented x 3, non-focal PSYCHIATRIC:  Normal affect, good insight  ASSESSMENT:    1. Atypical chest pain   2. Pre-procedure lab exam   3. Essential hypertension   4. Shortness of breath    PLAN:    Her symptoms are concerning as they are worsening.  First I like have the patient undergo an ischemic evaluation.  Her pharmacologic nuclear stress test is normal but her symptoms are disproportionate and I am worried about her anginal equivalent here.  A right and left heart catheterization will be appropriate at this time to enable me to understand right heart pressures and evaluate for coronary artery disease.  The patient understands that  risks include but are not limited to stroke (1 in 1000), death (1 in 1000), kidney failure [usually temporary] (1 in 500), bleeding (1 in 200), allergic reaction [possibly serious] (1 in 200), and agrees to proceed.  For now start her on aspirin 81 mg daily.  Sublingual nitroglycerin prescription was sent, its protocol and 911 protocol explained and the patient vocalized understanding questions were answered to the patient's satisfaction.  She will also maintain her Lasix 40 mg daily I encouraged the patient to continue to take this medication especially in the setting of her diastolic dysfunction.  Of note if her left heart catheterization and right heart pressures are normal it would be of benefit to get a cardiac MRI to understand rule out cardiac sarcoid.  Blood work will be done today.  The patient is in agreement with the above plan. The patient left the office in stable condition.  The patient will follow up in 1 month or sooner if needed.   Medication Adjustments/Labs and Tests Ordered: Current medicines are reviewed at length with the patient today.  Concerns regarding medicines are outlined above.  Orders Placed This Encounter  Procedures  . CBC  . Basic Metabolic Panel (BMET)   Meds ordered this encounter  Medications  . nitroGLYCERIN (NITROSTAT) 0.4 MG SL tablet    Sig: Place 1 tablet (0.4 mg total) under the tongue every 5 (five) minutes as needed.    Dispense:  30 tablet    Refill:  3  . aspirin EC 81 MG tablet    Sig: Take 1 tablet (81 mg total) by mouth daily.    Dispense:  90 tablet    Refill:  3    There are no Patient Instructions on file for this visit.   Adopting a Healthy Lifestyle.  Know what a healthy weight is  for you (roughly BMI <25) and aim to maintain this   Aim for 7+ servings of fruits and vegetables daily   65-80+ fluid ounces of water or unsweet tea for healthy kidneys   Limit to max 1 drink of alcohol per day; avoid smoking/tobacco   Limit  animal fats in diet for cholesterol and heart health - choose grass fed whenever available   Avoid highly processed foods, and foods high in saturated/trans fats   Aim for low stress - take time to unwind and care for your mental health   Aim for 150 min of moderate intensity exercise weekly for heart health, and weights twice weekly for bone health   Aim for 7-9 hours of sleep daily   When it comes to diets, agreement about the perfect plan isnt easy to find, even among the experts. Experts at the [employer] developed an idea known as the Healthy Eating Plate. Just imagine a plate divided into logical, healthy portions.   The emphasis is on diet quality:   Load up on vegetables and fruits - one-half of your plate: Aim for color and variety, and remember that potatoes dont count.   Go for whole grains - one-quarter of your plate: Whole wheat, barley, wheat berries, quinoa, oats, brown rice, and foods made with them. If you want pasta, go with whole wheat pasta.   Protein power - one-quarter of your plate: Fish, chicken, beans, and nuts are all healthy, versatile protein sources. Limit red meat.   The diet, however, does go beyond the plate, offering a few other suggestions.   Use healthy plant oils, such as olive, canola, soy, corn, sunflower and peanut. Check the labels, and avoid partially hydrogenated oil, which have unhealthy trans fats.   If youre thirsty, drink water. Coffee and tea are good in moderation, but skip sugary drinks and limit milk and dairy products to one or two daily servings.   The type of carbohydrate in the diet is more important than the amount. Some sources of carbohydrates, such as vegetables, fruits, whole grains, and beans-are healthier than others.   Finally, stay active  Signed, Berniece Salines, DO  01/09/2020 12:37 PM    [facility]

## 2020-01-09 NOTE — Patient Instructions (Signed)
Medication Instructions:  Your physician has recommended you make the following change in your medication:   Nitroglycerin 0.4 mg sublingual (under your tongue) as needed for chest pain. If experiencing chest pain, stop what you are doing and sit down. Take 1 nitroglycerin and wait 5 minutes. If chest pain continues, take another nitroglycerin and wait 5 minutes. If chest pain does not subside, take 1 more nitroglycerin and dial 911. You make take a total of 3 nitroglycerin in a 15 minute time frame.   START: Aspirin 81 mg TAke 1 tab daily  *If you need a refill on your cardiac medications before your next appointment, please call your pharmacy*  Lab Work: Your physician recommends that you return for lab work in: TODAY CBC,BMP  If you have labs (blood work) drawn today and your tests are completely normal, you will receive your results only by: Marland Kitchen MyChart Message (if you have MyChart) OR . A paper copy in the mail If you have any lab test that is abnormal or we need to change your treatment, we will call you to review the results.  Testing/Procedures:    [facility] CARDIOVASCULAR DIVISION [facility] [place] [address] [place] [address] Dept: [phone] Loc: [phone]  Leslie Sanders  01/09/2020  You are scheduled for a Cardiac Catheterization on Friday, February 5 with Dr. Verdis Prime.  1. Please arrive at the [facility] (Main Entrance A) at [facility]: [address] [place], [place] [address] at 5:30 AM (This time is two hours before your procedure to ensure your preparation). Free valet parking service is available.   Special note: Every effort is made to have your procedure done on time. Please understand that emergencies sometimes delay scheduled procedures.  2. Diet: Do not eat solid foods after midnight.  The patient may have clear liquids until 5am upon the day of the procedure.  3. Labs: You will  need to have a COVID SCREEN. Your appointment is Tues Feb 2,2021 at 1005 AM  at [address] [facility] [place]  4. Medication instructions in preparation for your procedure:   Contrast Allergy: No  On the morning of your procedure, take your Aspirin and any morning medicines NOT listed above.  You may use sips of water.  5. Plan for one night stay--bring personal belongings. 6. Bring a current list of your medications and current insurance cards. 7. You MUST have a responsible person to drive you home. 8. Someone MUST be with you the first 24 hours after you arrive home or your discharge will be delayed. 9. Please wear clothes that are easy to get on and off and wear slip-on shoes.  Thank you for allowing [place] to care for you!   -- [facility]  Follow-Up: At [facility], you and your health needs are our priority.  As part of our continuing mission to provide you with exceptional heart care, we have created designated Provider Care Teams.  These Care Teams include your primary Cardiologist (physician) and Advanced Practice Providers (APPs -  Physician Assistants and Nurse Practitioners) who all work together to provide you with the care you need, when you need it.  Your next appointment:   4 week(s)  The format for your next appointment:   In Person  Provider:   Thomasene Ripple, DO  Other Instructions

## 2020-01-09 NOTE — Progress Notes (Signed)
Cardiology Office Note:    Date:  01/09/2020   ID:  Leslie Sanders, DOB Jun 30, 1976, MRN [id number]  PCP:  Lynn Ito, MD  Cardiologist:  Gypsy Balsam, MD  Electrophysiologist:  None   Referring MD: Lynn Ito, MD   Follow-up for worsening shortness of breath .  History of Present Illness:    Leslie Sanders is a 44 y.o. female with a hx of B12 deficiency, former smoker, on no family history due to being raised in foster care, hypertension and diastolic dysfunction presents today to be evaluated for worsening shortness of breath and worsening chest pain.  The patient has been evaluated over the last several months for shortness of breath.  She was diagnosed with obstructive lung disease which she has been placed on Symbicort and Spiriva albuterol as was added recently and she continues to get worse.  In addition she has seen been seen previously for her symptoms and had a pharmacologic nuclear stress test in August 2020 which was reported as normal.  Recently she did see Dr. Dulce Sellar on December 20, 2019 at which time a transthoracic echocardiogram was done which showed normal LV EF, present of diastolic dysfunction, normal pulmonary artery pressures with no valvular abnormalities.  The patient is here today and she tells me that her symptoms have gotten worse where she is unable to walk 1 foot from her bed to her door into her kitchen without becoming significantly short of breath.  She notes that this is interfering with her daily of daily living.  She tells me that she used to experience left-sided chest pain maybe once or twice a month but since she saw Dr. Dulce Sellar this has increased to about every other day.  She describes the chest pain as a left-sided tightness that is slow in onset get worse within seconds and radiates to her left shoulder and down her arm.  She states after this her left arm is significantly numb in the last 4 several minutes at a time.  At first the shortness of breath  with the more bothersome symptoms but now the intermittent closely recurrent chest pain has become a big problem for her.  As the patient tells me about her symptoms she is in tears as she reports that both symptoms have become debilitating.  No other complaints at this time.   Past Medical History:  Diagnosis Date  . Anemia    (a) chronic blood loss hx of menorrhagia s/p endometrial biopsy 04/2015 (b) iron deficient  . Anxiety   . B12 deficiency   . Bipolar 1 disorder (HCC)   . Cervical spinal stenosis   . Cervical spondylosis without myelopathy 07/08/2016  . Chronic pain of right thumb 04/28/2018  . Depression   . GERD (gastroesophageal reflux disease)   . Hemorrhoids    Pt with constipation secondary to iron supplement. Pt has had rectal bleed secondary to internal hemorrhoids. Pt is s/p EGD/colonoscopy 05/2012  . 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 Migraine   . PONV (postoperative nausea and vomiting)   . PTSD (post-traumatic stress disorder)   . Seasonal allergies   . Vitamin E deficiency   . Zinc deficiency     Past Surgical History:  Procedure Laterality Date  . ABDOMINAL HYSTERECTOMY    . BREAST LUMPECTOMY Bilateral   . BREAST REDUCTION SURGERY    . Carpal Metacarpal Arthroplasty Right 05/13/2019  . CHOLECYSTECTOMY  1990  . HAND SURGERY    . HYSTERECTOMY ABDOMINAL  WITH SALPINGECTOMY  11/2017  . POSTERIOR CERVICAL LAMINECTOMY Bilateral 05/23/2019  . ROUX-EN-Y GASTRIC BYPASS  2003   in [place]  . SPINAL FUSION  05/23/2019    Current Medications: Current Meds  Medication Sig  . albuterol (PROAIR HFA) 108 (90 Base) MCG/ACT inhaler Inhale 1 puff into the lungs every 6 (six) hours as needed for wheezing or shortness of breath.  . budesonide-formoterol (SYMBICORT) 160-4.5 MCG/ACT inhaler Inhale 2 puffs into the lungs 2 (two) times daily.  . Buprenorphine 7.5 MCG/HR PTWK Place 7.5 mcg onto the skin once a week.  . cyanocobalamin (,VITAMIN B-12,) 1000 MCG/ML  injection Inject 1 mL into the muscle every 30 (thirty) days.  Marland Kitchen docusate sodium (COLACE) 100 MG capsule Take 1 capsule (100 mg total) by mouth 2 (two) times daily as needed for mild constipation or moderate constipation.  . DULoxetine (CYMBALTA) 60 MG capsule Take 1 capsule by mouth daily.  . famotidine (PEPCID) 20 MG tablet Take 1 tablet (20 mg total) by mouth 2 (two) times daily.  . ferrous sulfate 325 (65 FE) MG tablet Take 1 tablet (325 mg total) by mouth 2 (two) times daily with a meal. (Patient taking differently: Take 325 mg by mouth 3 (three) times daily. )  . fluticasone (FLONASE) 50 MCG/ACT nasal spray Place 2 sprays into both nostrils daily.  . Fluticasone-Salmeterol (ADVAIR) 250-50 MCG/DOSE AEPB Inhale 1 puff into the lungs 2 (two) times daily.  . folic acid (FOLVITE) 1 MG tablet Take 1 tablet by mouth daily.  . furosemide (LASIX) 40 MG tablet Take 1 tablet (40 mg total) by mouth daily.  . hydrOXYzine (ATARAX/VISTARIL) 25 MG tablet Take 1 tablet (25 mg total) by mouth every 6 (six) hours as needed for anxiety.  Marland Kitchen levalbuterol (XOPENEX HFA) 45 MCG/ACT inhaler Inhale 2 puffs into the lungs every 6 (six) hours as needed.  . lidocaine (XYLOCAINE) 5 % ointment Apply topically as needed.  . montelukast (SINGULAIR) 10 MG tablet Take 10 mg by mouth at bedtime.  . pregabalin (LYRICA) 100 MG capsule Take 1 capsule by mouth daily.  . Tiotropium Bromide Monohydrate (SPIRIVA RESPIMAT) 2.5 MCG/ACT AERS Inhale 1 puff into the lungs daily.  . Vitamin D, Ergocalciferol, (DRISDOL) 1.25 MG (50000 UT) CAPS capsule Take 1 capsule by mouth once a week.     Allergies:   Tylenol [acetaminophen], Adhesive  [tape], Hydrocodone-acetaminophen, Nsaids, and Latex   Social History   Socioeconomic History  . Marital status: Divorced    Spouse name: Not on file  . Number of children: Not on file  . Years of education: Not on file  . Highest education level: Not on file  Occupational History  . Not on file    Tobacco Use  . Smoking status: Former Smoker    Packs/day: 2.00    Years: 23.00    Pack years: 46.00    Types: Cigarettes    Quit date: 12/08/2018    Years since quitting: 1.0  . Smokeless tobacco: Never Used  Substance and Sexual Activity  . Alcohol use: Not Currently    Alcohol/week: 28.0 standard drinks    Types: 28 Cans of beer per week    Comment: Nothing in 4 years  . Drug use: No  . Sexual activity: Not Currently  Other Topics Concern  . Not on file  Social History Narrative  . Not on file   Social Determinants of Health   Financial Resource Strain:   . Difficulty of Paying Living Expenses: Not on  file  Food Insecurity:   . Worried About Running Out of Food in the Last Year: Not on file  . Ran Out of Food in the Last Year: Not on file  Transportation Needs:   . Lack of Transportation (Medical): Not on file  . Lack of Transportation (Non-Medical): Not on file  Physical Activity:   . Days of Exercise per Week: Not on file  . Minutes of Exercise per Session: Not on file  Stress:   . Feeling of Stress : Not on file  Social Connections:   . Frequency of Communication with Friends and Family: Not on file  . Frequency of Social Gatherings with Friends and Family: Not on file  . Attends Religious Services: Not on file  . Active Member of Clubs or Organizations: Not on file  . Attends Club or Organization Meetings: Not on file  . Marital Status: Not on file     Family History: The patient's family history includes ADD / ADHD in her son; Arthritis/Rheumatoid in her mother; Breast cancer in her mother; COPD in her maternal grandmother and paternal grandmother; HIV in her father; Lupus in her mother; ODD in her son; Rheum arthritis in her maternal grandmother; Schizophrenia in her maternal aunt.  ROS:   Review of Systems  Constitution: Negative for decreased appetite, fever and weight gain.  HENT: Negative for congestion, ear discharge, hoarse voice and sore throat.    Eyes: Negative for discharge, redness, vision loss in right eye and visual halos.  Cardiovascular: Reports chest pain, dyspnea on exertion.  Negative for, leg swelling, orthopnea and palpitations.  Respiratory: Negative for cough, hemoptysis, shortness of breath and snoring.   Endocrine: Negative for heat intolerance and polyphagia.  Hematologic/Lymphatic: Negative for bleeding problem. Does not bruise/bleed easily.  Skin: Negative for flushing, nail changes, rash and suspicious lesions.  Musculoskeletal: Negative for arthritis, joint pain, muscle cramps, myalgias, neck pain and stiffness.  Gastrointestinal: Negative for abdominal pain, bowel incontinence, diarrhea and excessive appetite.  Genitourinary: Negative for decreased libido, genital sores and incomplete emptying.  Neurological: Negative for brief paralysis, focal weakness, headaches and loss of balance.  Psychiatric/Behavioral: Negative for altered mental status, depression and suicidal ideas.  Allergic/Immunologic: Negative for HIV exposure and persistent infections.    EKGs/Labs/Other Studies Reviewed:    The following studies were reviewed today:   EKG: None today  Echocardiogram December 22, 2019 IMPRESSIONS  1. Left ventricular ejection fraction, by visual estimation, is 60 to  65%. The left ventricle has normal function. There is no left ventricular  hypertrophy.  2. Left ventricular diastolic parameters are consistent with Grade I  diastolic dysfunction (impaired relaxation).  3. The left ventricle has no regional wall motion abnormalities.  4. Normal pulmonary artery systolic pressure.  5. The inferior vena cava is dilated in size with <50% respiratory  variability, suggesting right atrial pressure of 15 mmHg.   Pharmacologic nuclear stress test  Nuclear stress EF: 63%.  There was no ST segment deviation noted during stress.  The study is normal.  This is a low risk study.  The left ventricular  ejection fraction is normal (55-65%). Normal stress nuclear study with no ischemia or infarction.  Gated ejection fraction 63% with normal wall motion.   Recent Labs: 12/20/2019: NT-Pro BNP 60 12/22/2019: BUN 9; Creatinine, Ser 0.56; Hemoglobin 11.2; Platelets 400; Potassium 3.4; Sodium 139  Recent Lipid Panel    Component Value Date/Time   CHOL 173 11/29/2016 0644   TRIG 84 11/29/2016 0644     HDL 88 11/29/2016 0644   CHOLHDL 2.0 11/29/2016 0644   VLDL 17 11/29/2016 0644   LDLCALC 68 11/29/2016 0644    Physical Exam:    VS:  BP 104/72 (BP Location: Right Arm)   Pulse 90   Ht 5\' 2"  (1.575 m)   Wt 131 lb (59.4 kg)   LMP 10/07/2016   SpO2 100%   BMI 23.96 kg/m     Wt Readings from Last 3 Encounters:  01/09/20 131 lb (59.4 kg)  12/20/19 134 lb (60.8 kg)  10/31/19 134 lb 6.4 oz (61 kg)     GEN: Well nourished, well developed in no acute distress HEENT: Normal NECK: No JVD; No carotid bruits LYMPHATICS: No lymphadenopathy CARDIAC: S1S2 noted,RRR, no murmurs, rubs, gallops RESPIRATORY:  Clear to auscultation without rales, wheezing or rhonchi  ABDOMEN: Soft, non-tender, non-distended, +bowel sounds, no guarding. EXTREMITIES: No edema, No cyanosis, no clubbing MUSCULOSKELETAL:  No deformity  SKIN: Warm and dry NEUROLOGIC:  Alert and oriented x 3, non-focal PSYCHIATRIC:  Normal affect, good insight  ASSESSMENT:    1. Atypical chest pain   2. Pre-procedure lab exam   3. Essential hypertension   4. Shortness of breath    PLAN:    Her symptoms are concerning as they are worsening.  First I like have the patient undergo an ischemic evaluation.  Her pharmacologic nuclear stress test is normal but her symptoms are disproportionate and I am worried about her anginal equivalent here.  A right and left heart catheterization will be appropriate at this time to enable me to understand right heart pressures and evaluate for coronary artery disease.  The patient understands that  risks include but are not limited to stroke (1 in 1000), death (1 in 1000), kidney failure [usually temporary] (1 in 500), bleeding (1 in 200), allergic reaction [possibly serious] (1 in 200), and agrees to proceed.  For now start her on aspirin 81 mg daily.  Sublingual nitroglycerin prescription was sent, its protocol and 911 protocol explained and the patient vocalized understanding questions were answered to the patient's satisfaction.  She will also maintain her Lasix 40 mg daily I encouraged the patient to continue to take this medication especially in the setting of her diastolic dysfunction.  Of note if her left heart catheterization and right heart pressures are normal it would be of benefit to get a cardiac MRI to understand rule out cardiac sarcoid.  Blood work will be done today.  The patient is in agreement with the above plan. The patient left the office in stable condition.  The patient will follow up in 1 month or sooner if needed.   Medication Adjustments/Labs and Tests Ordered: Current medicines are reviewed at length with the patient today.  Concerns regarding medicines are outlined above.  Orders Placed This Encounter  Procedures  . CBC  . Basic Metabolic Panel (BMET)   Meds ordered this encounter  Medications  . nitroGLYCERIN (NITROSTAT) 0.4 MG SL tablet    Sig: Place 1 tablet (0.4 mg total) under the tongue every 5 (five) minutes as needed.    Dispense:  30 tablet    Refill:  3  . aspirin EC 81 MG tablet    Sig: Take 1 tablet (81 mg total) by mouth daily.    Dispense:  90 tablet    Refill:  3    There are no Patient Instructions on file for this visit.   Adopting a Healthy Lifestyle.  Know what a healthy weight is  for you (roughly BMI <25) and aim to maintain this   Aim for 7+ servings of fruits and vegetables daily   65-80+ fluid ounces of water or unsweet tea for healthy kidneys   Limit to max 1 drink of alcohol per day; avoid smoking/tobacco   Limit  animal fats in diet for cholesterol and heart health - choose grass fed whenever available   Avoid highly processed foods, and foods high in saturated/trans fats   Aim for low stress - take time to unwind and care for your mental health   Aim for 150 min of moderate intensity exercise weekly for heart health, and weights twice weekly for bone health   Aim for 7-9 hours of sleep daily   When it comes to diets, agreement about the perfect plan isnt easy to find, even among the experts. Experts at the [employer] developed an idea known as the Healthy Eating Plate. Just imagine a plate divided into logical, healthy portions.   The emphasis is on diet quality:   Load up on vegetables and fruits - one-half of your plate: Aim for color and variety, and remember that potatoes dont count.   Go for whole grains - one-quarter of your plate: Whole wheat, barley, wheat berries, quinoa, oats, brown rice, and foods made with them. If you want pasta, go with whole wheat pasta.   Protein power - one-quarter of your plate: Fish, chicken, beans, and nuts are all healthy, versatile protein sources. Limit red meat.   The diet, however, does go beyond the plate, offering a few other suggestions.   Use healthy plant oils, such as olive, canola, soy, corn, sunflower and peanut. Check the labels, and avoid partially hydrogenated oil, which have unhealthy trans fats.   If youre thirsty, drink water. Coffee and tea are good in moderation, but skip sugary drinks and limit milk and dairy products to one or two daily servings.   The type of carbohydrate in the diet is more important than the amount. Some sources of carbohydrates, such as vegetables, fruits, whole grains, and beans-are healthier than others.   Finally, stay active  Signed, Berniece Salines, DO  01/09/2020 12:37 PM    [facility]

## 2020-01-10 ENCOUNTER — Other Ambulatory Visit (HOSPITAL_COMMUNITY)
Admission: RE | Admit: 2020-01-10 | Discharge: 2020-01-10 | Disposition: A | Discharge status: Home / Self Care | Payer: Medicare Other | Source: Ambulatory Visit | Attending: Interventional Cardiology | Admitting: Interventional Cardiology

## 2020-01-10 ENCOUNTER — Telehealth: Payer: Self-pay | Admitting: Critical Care Medicine

## 2020-01-10 ENCOUNTER — Telehealth: Payer: Self-pay | Admitting: *Deleted

## 2020-01-10 DIAGNOSIS — Z20822 Contact with and (suspected) exposure to covid-19: Secondary | ICD-10-CM | POA: Diagnosis not present

## 2020-01-10 DIAGNOSIS — Z01812 Encounter for preprocedural laboratory examination: Secondary | ICD-10-CM | POA: Insufficient documentation

## 2020-01-10 LAB — BASIC METABOLIC PANEL
BUN/Creatinine Ratio: 15 (ref 9–23)
BUN: 10 mg/dL (ref 6–24)
CO2: 22 mmol/L (ref 20–29)
Calcium: 9.4 mg/dL (ref 8.7–10.2)
Chloride: 101 mmol/L (ref 96–106)
Creatinine, Ser: 0.67 mg/dL (ref 0.57–1.00)
GFR calc Af Amer: 124 mL/min/{1.73_m2} (ref >59)
GFR calc non Af Amer: 107 mL/min/{1.73_m2} (ref >59)
Glucose: 86 mg/dL (ref 65–99)
Potassium: 4.1 mmol/L (ref 3.5–5.2)
Sodium: 138 mmol/L (ref 134–144)

## 2020-01-10 LAB — CBC
Hematocrit: 39 % (ref 34.0–46.6)
Hemoglobin: 12.4 g/dL (ref 11.1–15.9)
MCH: 27.9 pg (ref 26.6–33.0)
MCHC: 31.8 g/dL (ref 31.5–35.7)
MCV: 88 fL (ref 79–97)
Platelets: 399 10*3/uL (ref 150–450)
RBC: 4.44 x10E6/uL (ref 3.77–5.28)
RDW: 12.6 % (ref 11.7–15.4)
WBC: 6.1 10*3/uL (ref 3.4–10.8)

## 2020-01-10 LAB — HEMOGLOBIN A1C
Est. average glucose Bld gHb Est-mCnc: 88 mg/dL
Hgb A1c MFr Bld: 4.7 % — ABNORMAL LOW (ref 4.8–5.6)

## 2020-01-10 LAB — SARS CORONAVIRUS 2 (TAT 6-24 HRS): SARS Coronavirus 2: NEGATIVE

## 2020-01-10 NOTE — Telephone Encounter (Signed)
Called the pharmacy and was told the form was faxed again this morning. It was needed for insurance. They need two questions answered on the form. Needs to be signed and send back quickly as the patient will end up having to pay out of pocket for medication.

## 2020-01-10 NOTE — Telephone Encounter (Signed)
-----   Message from Thomasene Ripple, DO sent at 01/10/2020  8:21 AM EST ----- Labs stable.

## 2020-01-10 NOTE — Telephone Encounter (Signed)
Telephone call to patient. Left message with lab results and to call with any questions. 

## 2020-01-11 ENCOUNTER — Ambulatory Visit (INDEPENDENT_AMBULATORY_CARE_PROVIDER_SITE_OTHER): Payer: Medicare Other | Admitting: Critical Care Medicine

## 2020-01-11 ENCOUNTER — Other Ambulatory Visit: Payer: Self-pay

## 2020-01-11 ENCOUNTER — Encounter: Payer: Self-pay | Admitting: Critical Care Medicine

## 2020-01-11 VITALS — BP 120/70 | HR 100 | Temp 97.2°F | Ht 62.0 in | Wt 132.6 lb

## 2020-01-11 DIAGNOSIS — K219 Gastro-esophageal reflux disease without esophagitis: Secondary | ICD-10-CM | POA: Diagnosis not present

## 2020-01-11 DIAGNOSIS — R0602 Shortness of breath: Secondary | ICD-10-CM

## 2020-01-11 DIAGNOSIS — J449 Chronic obstructive pulmonary disease, unspecified: Secondary | ICD-10-CM | POA: Diagnosis not present

## 2020-01-11 DIAGNOSIS — J4540 Moderate persistent asthma, uncomplicated: Secondary | ICD-10-CM | POA: Diagnosis not present

## 2020-01-11 MED ORDER — PANTOPRAZOLE SODIUM 40 MG PO TBEC: 40.0000 mg | DELAYED_RELEASE_TABLET | Freq: Every day | ORAL | 11 refills | Status: AC

## 2020-01-11 NOTE — Telephone Encounter (Signed)
Fax from [facility] has been located and was faxed back to them on 01/05/2020 at 9:10am. Faxed to them again today 01/11/2020. Nothing further needed at this time.
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
## 2020-01-11 NOTE — Patient Instructions (Addendum)
Thank you for visiting Dr. Chestine Spore at [facility]. We recommend the following: Orders Placed This Encounter  Procedures  . Ambulatory referral to Gastroenterology  . Pulmonary function test   Orders Placed This Encounter  Procedures  . Ambulatory referral to Gastroenterology    Referral Priority:   Routine    Referral Type:   Consultation    Referral Reason:   Specialty Services Required    Number of Visits Requested:   1  . Pulmonary function test    Standing Status:   Future    Standing Expiration Date:   01/10/2021    Order Specific Question:   Where should this test be performed?    Answer:   [facility]    Order Specific Question:   Full PFT: includes the following: basic spirometry, spirometry pre & post bronchodilator, diffusion capacity (DLCO), lung volumes    Answer:   Full PFT    Meds ordered this encounter  Medications  . pantoprazole (PROTONIX) 40 MG tablet    Sig: Take 1 tablet (40 mg total) by mouth daily.    Dispense:  30 tablet    Refill:  11    Return in about 2 months (around 03/10/2020).   Keep taking Symbicort, Spiriva, Singulair, flonase.  Stop pepcid. Start taking pantoprazole (protonix) once daily- about 30 min before breakfast.    Please do your part to reduce the spread of COVID-19.   Food Choices for Gastroesophageal Reflux Disease, Adult When you have gastroesophageal reflux disease (GERD), the foods you eat and your eating habits are very important. Choosing the right foods can help ease your discomfort. Think about working with a nutrition specialist (dietitian) to help you make good choices. What are tips for following this plan?  Meals  Choose healthy foods that are low in fat, such as fruits, vegetables, whole grains, low-fat dairy products, and lean meat, fish, and poultry.  Eat small meals often instead of 3 large meals a day. Eat your meals slowly, and in a place where you are relaxed. Avoid bending over or lying down until  2-3 hours after eating.  Avoid eating meals 2-3 hours before bed.  Avoid drinking a lot of liquid with meals.  Cook foods using methods other than frying. Bake, grill, or broil food instead.  Avoid or limit: ? Chocolate. ? Peppermint or spearmint. ? Alcohol. ? Pepper. ? Black and decaffeinated coffee. ? Black and decaffeinated tea. ? Bubbly (carbonated) soft drinks. ? Caffeinated energy drinks and soft drinks.  Limit high-fat foods such as: ? Fatty meat or fried foods. ? Whole milk, cream, butter, or ice cream. ? Nuts and nut butters. ? Pastries, donuts, and sweets made with butter or shortening.  Avoid foods that cause symptoms. These foods may be different for everyone. Common foods that cause symptoms include: ? Tomatoes. ? Oranges, lemons, and limes. ? Peppers. ? Spicy food. ? Onions and garlic. ? Vinegar. Lifestyle  Maintain a healthy weight. Ask your doctor what weight is healthy for you. If you need to lose weight, work with your doctor to do so safely.  Exercise for at least 30 minutes for 5 or more days each week, or as told by your doctor.  Wear loose-fitting clothes.  Do not smoke. If you need help quitting, ask your doctor.  Sleep with the head of your bed higher than your feet. Use a wedge under the mattress or blocks under the bed frame to raise the head of the bed. Summary  When you have gastroesophageal reflux disease (GERD), food and lifestyle choices are very important in easing your symptoms.  Eat small meals often instead of 3 large meals a day. Eat your meals slowly, and in a place where you are relaxed.  Limit high-fat foods such as fatty meat or fried foods.  Avoid bending over or lying down until 2-3 hours after eating.  Avoid peppermint and spearmint, caffeine, alcohol, and chocolate. This information is not intended to replace advice given to you by your health care provider. Make sure you discuss any questions you have with your health  care provider. Document Revised: 03/17/2019 Document Reviewed: 12/30/2016 Elsevier Patient Education  [place].

## 2020-01-12 ENCOUNTER — Telehealth: Payer: Self-pay | Admitting: *Deleted

## 2020-01-12 NOTE — Telephone Encounter (Addendum)
Pt contacted pre-catheterization scheduled at [facility] for: Friday January 13, 2020 7:30 AM Verified arrival time and place: [facility] Main Entrance A [facility]) at: 5:30 AM   No solid food after midnight prior to cath, clear liquids until 5 AM day of procedure. Contrast allergy: no  Hold: Lasix-AM of procedure  Except hold medications AM meds can be  taken pre-cath with sip of water including: ASA 81 mg   Confirmed patient has responsible adult to drive home post procedure and observe 24 hours after arriving home: yes  Currently, due to Covid-19 pandemic, only one person will be allowed with patient. Must be the same person for patient's entire stay and will be required to wear a mask. They will be asked to wait in the waiting room for the duration of the patient's stay.  Patients are required to wear a mask when they enter the hospital.      COVID-19 Pre-Screening Questions:  . In the past 7 to 10 days have you had a cough,  shortness of breath, headache, congestion, fever (100 or greater) body aches, chills, sore throat, or sudden loss of taste or sense of smell? Shortness of breath, not new in 7-10 days . Have you been around anyone with known Covid 19 in the past 7-10 days? no . Have you been around anyone who is awaiting Covid 19 test results in the past 7 to 10 days? . Have you been around anyone who has been exposed to Covid 19, or has mentioned symptoms of Covid 19 within the past 7 to 10 days? no  I reviewed procedure/mask/visitor instructions, Covid-19 screening questions with patient, she verbalized understanding, thanked me for call.

## 2020-01-12 NOTE — Telephone Encounter (Signed)
Patient states she is returning call regarding instructions for the procedure. Please call to discuss and patient states that she would like a detailed voice message if she is unavailable.

## 2020-01-13 ENCOUNTER — Other Ambulatory Visit: Payer: Self-pay

## 2020-01-13 ENCOUNTER — Encounter (HOSPITAL_COMMUNITY)
Admission: RE | Disposition: A | Discharge status: Home / Self Care | Payer: Self-pay | Source: Home / Self Care | Attending: Interventional Cardiology

## 2020-01-13 ENCOUNTER — Ambulatory Visit (HOSPITAL_COMMUNITY)
Admission: RE | Admit: 2020-01-13 | Discharge: 2020-01-13 | Disposition: A | Discharge status: Home / Self Care | Payer: Medicare Other | Attending: Interventional Cardiology | Admitting: Interventional Cardiology

## 2020-01-13 DIAGNOSIS — R9431 Abnormal electrocardiogram [ECG] [EKG]: Secondary | ICD-10-CM | POA: Diagnosis not present

## 2020-01-13 DIAGNOSIS — I110 Hypertensive heart disease with heart failure: Secondary | ICD-10-CM | POA: Diagnosis not present

## 2020-01-13 DIAGNOSIS — F419 Anxiety disorder, unspecified: Secondary | ICD-10-CM | POA: Diagnosis not present

## 2020-01-13 DIAGNOSIS — F319 Bipolar disorder, unspecified: Secondary | ICD-10-CM | POA: Insufficient documentation

## 2020-01-13 DIAGNOSIS — E538 Deficiency of other specified B group vitamins: Secondary | ICD-10-CM | POA: Insufficient documentation

## 2020-01-13 DIAGNOSIS — R0602 Shortness of breath: Secondary | ICD-10-CM | POA: Insufficient documentation

## 2020-01-13 DIAGNOSIS — R0789 Other chest pain: Secondary | ICD-10-CM | POA: Diagnosis not present

## 2020-01-13 DIAGNOSIS — Z79899 Other long term (current) drug therapy: Secondary | ICD-10-CM | POA: Insufficient documentation

## 2020-01-13 DIAGNOSIS — Z888 Allergy status to other drugs, medicaments and biological substances status: Secondary | ICD-10-CM | POA: Diagnosis not present

## 2020-01-13 DIAGNOSIS — Z886 Allergy status to analgesic agent status: Secondary | ICD-10-CM | POA: Diagnosis not present

## 2020-01-13 DIAGNOSIS — R0609 Other forms of dyspnea: Secondary | ICD-10-CM | POA: Diagnosis present

## 2020-01-13 DIAGNOSIS — K219 Gastro-esophageal reflux disease without esophagitis: Secondary | ICD-10-CM | POA: Diagnosis not present

## 2020-01-13 DIAGNOSIS — Z7951 Long term (current) use of inhaled steroids: Secondary | ICD-10-CM | POA: Insufficient documentation

## 2020-01-13 DIAGNOSIS — R0600 Dyspnea, unspecified: Secondary | ICD-10-CM | POA: Diagnosis not present

## 2020-01-13 DIAGNOSIS — Z885 Allergy status to narcotic agent status: Secondary | ICD-10-CM | POA: Insufficient documentation

## 2020-01-13 DIAGNOSIS — R079 Chest pain, unspecified: Secondary | ICD-10-CM | POA: Diagnosis present

## 2020-01-13 DIAGNOSIS — Z87891 Personal history of nicotine dependence: Secondary | ICD-10-CM | POA: Insufficient documentation

## 2020-01-13 DIAGNOSIS — I5032 Chronic diastolic (congestive) heart failure: Secondary | ICD-10-CM | POA: Diagnosis not present

## 2020-01-13 DIAGNOSIS — F3130 Bipolar disorder, current episode depressed, mild or moderate severity, unspecified: Secondary | ICD-10-CM | POA: Diagnosis present

## 2020-01-13 HISTORY — PX: RIGHT/LEFT HEART CATH AND CORONARY ANGIOGRAPHY: CATH118266

## 2020-01-13 LAB — POCT I-STAT EG7
Acid-base deficit: 2 mmol/L (ref 0.0–2.0)
Acid-base deficit: 3 mmol/L — ABNORMAL HIGH (ref 0.0–2.0)
Acid-base deficit: 3 mmol/L — ABNORMAL HIGH (ref 0.0–2.0)
Acid-base deficit: 3 mmol/L — ABNORMAL HIGH (ref 0.0–2.0)
Bicarbonate: 17.4 mmol/L — ABNORMAL LOW (ref 20.0–28.0)
Bicarbonate: 17.9 mmol/L — ABNORMAL LOW (ref 20.0–28.0)
Bicarbonate: 18.1 mmol/L — ABNORMAL LOW (ref 20.0–28.0)
Bicarbonate: 18.3 mmol/L — ABNORMAL LOW (ref 20.0–28.0)
Calcium, Ion: 1.09 mmol/L — ABNORMAL LOW (ref 1.15–1.40)
Calcium, Ion: 1.1 mmol/L — ABNORMAL LOW (ref 1.15–1.40)
Calcium, Ion: 1.12 mmol/L — ABNORMAL LOW (ref 1.15–1.40)
Calcium, Ion: 1.12 mmol/L — ABNORMAL LOW (ref 1.15–1.40)
HCT: 32 % — ABNORMAL LOW (ref 36.0–46.0)
HCT: 32 % — ABNORMAL LOW (ref 36.0–46.0)
HCT: 33 % — ABNORMAL LOW (ref 36.0–46.0)
HCT: 33 % — ABNORMAL LOW (ref 36.0–46.0)
Hemoglobin: 10.9 g/dL — ABNORMAL LOW (ref 12.0–15.0)
Hemoglobin: 10.9 g/dL — ABNORMAL LOW (ref 12.0–15.0)
Hemoglobin: 11.2 g/dL — ABNORMAL LOW (ref 12.0–15.0)
Hemoglobin: 11.2 g/dL — ABNORMAL LOW (ref 12.0–15.0)
O2 Saturation: 74 %
O2 Saturation: 76 %
O2 Saturation: 79 %
O2 Saturation: 82 %
Potassium: 3.1 mmol/L — ABNORMAL LOW (ref 3.5–5.1)
Potassium: 3.1 mmol/L — ABNORMAL LOW (ref 3.5–5.1)
Potassium: 3.2 mmol/L — ABNORMAL LOW (ref 3.5–5.1)
Potassium: 3.2 mmol/L — ABNORMAL LOW (ref 3.5–5.1)
Sodium: 142 mmol/L (ref 135–145)
Sodium: 142 mmol/L (ref 135–145)
Sodium: 142 mmol/L (ref 135–145)
Sodium: 142 mmol/L (ref 135–145)
TCO2: 18 mmol/L — ABNORMAL LOW (ref 22–32)
TCO2: 18 mmol/L — ABNORMAL LOW (ref 22–32)
TCO2: 19 mmol/L — ABNORMAL LOW (ref 22–32)
TCO2: 19 mmol/L — ABNORMAL LOW (ref 22–32)
pCO2, Ven: 19.1 mmHg — CL (ref 44.0–60.0)
pCO2, Ven: 19.6 mmHg — CL (ref 44.0–60.0)
pCO2, Ven: 19.8 mmHg — CL (ref 44.0–60.0)
pCO2, Ven: 20.7 mmHg — ABNORMAL LOW (ref 44.0–60.0)
pH, Ven: 7.552 — ABNORMAL HIGH (ref 7.250–7.430)
pH, Ven: 7.564 — ABNORMAL HIGH (ref 7.250–7.430)
pH, Ven: 7.568 — ABNORMAL HIGH (ref 7.250–7.430)
pH, Ven: 7.577 — ABNORMAL HIGH (ref 7.250–7.430)
pO2, Ven: 32 mmHg (ref 32.0–45.0)
pO2, Ven: 33 mmHg (ref 32.0–45.0)
pO2, Ven: 36 mmHg (ref 32.0–45.0)
pO2, Ven: 38 mmHg (ref 32.0–45.0)

## 2020-01-13 LAB — POCT I-STAT 7, (LYTES, BLD GAS, ICA,H+H)
Acid-base deficit: 2 mmol/L (ref 0.0–2.0)
Bicarbonate: 17.7 mmol/L — ABNORMAL LOW (ref 20.0–28.0)
Calcium, Ion: 1.07 mmol/L — ABNORMAL LOW (ref 1.15–1.40)
HCT: 32 % — ABNORMAL LOW (ref 36.0–46.0)
Hemoglobin: 10.9 g/dL — ABNORMAL LOW (ref 12.0–15.0)
O2 Saturation: 100 %
Potassium: 3.1 mmol/L — ABNORMAL LOW (ref 3.5–5.1)
Sodium: 143 mmol/L (ref 135–145)
TCO2: 18 mmol/L — ABNORMAL LOW (ref 22–32)
pCO2 arterial: 17.4 mmHg — CL (ref 32.0–48.0)
pH, Arterial: 7.616 (ref 7.350–7.450)
pO2, Arterial: 177 mmHg — ABNORMAL HIGH (ref 83.0–108.0)

## 2020-01-13 SURGERY — RIGHT/LEFT HEART CATH AND CORONARY ANGIOGRAPHY
Anesthesia: LOCAL

## 2020-01-13 MED ORDER — ALBUTEROL SULFATE HFA 108 (90 BASE) MCG/ACT IN AERS: 1.0000 | INHALATION_SPRAY | Freq: Four times a day (QID) | RESPIRATORY_TRACT | Status: DC | PRN

## 2020-01-13 MED ORDER — MOMETASONE FURO-FORMOTEROL FUM 200-5 MCG/ACT IN AERO: 2.0000 | INHALATION_SPRAY | Freq: Two times a day (BID) | RESPIRATORY_TRACT | Status: DC

## 2020-01-13 MED ORDER — BUSPIRONE HCL 5 MG PO TABS: 5.0000 mg | ORAL_TABLET | Freq: Two times a day (BID) | ORAL | Status: DC

## 2020-01-13 MED ORDER — FENTANYL CITRATE (PF) 100 MCG/2ML IJ SOLN
INTRAMUSCULAR | Status: AC
  Filled 2020-01-13: qty 2

## 2020-01-13 MED ORDER — PANTOPRAZOLE SODIUM 40 MG PO TBEC: 40.0000 mg | DELAYED_RELEASE_TABLET | Freq: Every day | ORAL | Status: DC

## 2020-01-13 MED ORDER — MIDAZOLAM HCL 2 MG/2ML IJ SOLN
INTRAMUSCULAR | Status: AC
  Filled 2020-01-13: qty 2

## 2020-01-13 MED ORDER — IOHEXOL 350 MG/ML SOLN
INTRAVENOUS | Status: DC | PRN
  Administered 2020-01-13: 40 mL via INTRACARDIAC

## 2020-01-13 MED ORDER — SODIUM CHLORIDE 0.9 % WEIGHT BASED INFUSION
3.0000 mL/kg/h | INTRAVENOUS | Status: AC
  Administered 2020-01-13: 3 mL/kg/h via INTRAVENOUS

## 2020-01-13 MED ORDER — CYANOCOBALAMIN 1000 MCG/ML IJ SOLN: 1000.0000 ug | INTRAMUSCULAR | Status: DC

## 2020-01-13 MED ORDER — VERAPAMIL HCL 2.5 MG/ML IV SOLN
INTRAVENOUS | Status: AC
  Filled 2020-01-13: qty 2

## 2020-01-13 MED ORDER — HEPARIN (PORCINE) IN NACL 1000-0.9 UT/500ML-% IV SOLN
INTRAVENOUS | Status: AC
  Filled 2020-01-13: qty 1000

## 2020-01-13 MED ORDER — PREGABALIN 75 MG PO CAPS: 150.0000 mg | ORAL_CAPSULE | Freq: Two times a day (BID) | ORAL | Status: DC

## 2020-01-13 MED ORDER — LIDOCAINE HCL (PF) 1 % IJ SOLN
INTRAMUSCULAR | Status: AC
  Filled 2020-01-13: qty 30

## 2020-01-13 MED ORDER — TIOTROPIUM BROMIDE MONOHYDRATE 2.5 MCG/ACT IN AERS: 1.0000 | INHALATION_SPRAY | Freq: Every day | RESPIRATORY_TRACT | Status: DC

## 2020-01-13 MED ORDER — SODIUM CHLORIDE 0.9 % WEIGHT BASED INFUSION: 1.0000 mL/kg/h | INTRAVENOUS | Status: DC

## 2020-01-13 MED ORDER — LABETALOL HCL 5 MG/ML IV SOLN: 10.0000 mg | INTRAVENOUS | Status: DC | PRN

## 2020-01-13 MED ORDER — SODIUM CHLORIDE 0.9% FLUSH: 3.0000 mL | INTRAVENOUS | Status: DC | PRN

## 2020-01-13 MED ORDER — FENTANYL CITRATE (PF) 100 MCG/2ML IJ SOLN
INTRAMUSCULAR | Status: DC | PRN
  Administered 2020-01-13: 25 ug via INTRAVENOUS

## 2020-01-13 MED ORDER — SODIUM CHLORIDE 0.9% FLUSH: 3.0000 mL | Freq: Two times a day (BID) | INTRAVENOUS | Status: DC

## 2020-01-13 MED ORDER — FOLIC ACID 1 MG PO TABS: 1.0000 mg | ORAL_TABLET | Freq: Every day | ORAL | Status: DC

## 2020-01-13 MED ORDER — HEPARIN SODIUM (PORCINE) 1000 UNIT/ML IJ SOLN
INTRAMUSCULAR | Status: AC
  Filled 2020-01-13: qty 1

## 2020-01-13 MED ORDER — ACETAMINOPHEN 325 MG PO TABS: 650.0000 mg | ORAL_TABLET | ORAL | Status: DC | PRN

## 2020-01-13 MED ORDER — HYDRALAZINE HCL 20 MG/ML IJ SOLN: 10.0000 mg | INTRAMUSCULAR | Status: DC | PRN

## 2020-01-13 MED ORDER — HEPARIN SODIUM (PORCINE) 1000 UNIT/ML IJ SOLN
INTRAMUSCULAR | Status: DC | PRN
  Administered 2020-01-13: 3000 [IU] via INTRAVENOUS

## 2020-01-13 MED ORDER — DIAZEPAM 5 MG PO TABS: 5.0000 mg | ORAL_TABLET | Freq: Three times a day (TID) | ORAL | Status: DC | PRN

## 2020-01-13 MED ORDER — DOCUSATE SODIUM 100 MG PO CAPS: 100.0000 mg | ORAL_CAPSULE | Freq: Every day | ORAL | Status: DC

## 2020-01-13 MED ORDER — FERROUS SULFATE 325 (65 FE) MG PO TABS: 325.0000 mg | ORAL_TABLET | Freq: Three times a day (TID) | ORAL | Status: DC

## 2020-01-13 MED ORDER — MONTELUKAST SODIUM 10 MG PO TABS: 10.0000 mg | ORAL_TABLET | Freq: Every day | ORAL | Status: DC

## 2020-01-13 MED ORDER — FLUTICASONE PROPIONATE 50 MCG/ACT NA SUSP: 2.0000 | Freq: Every day | NASAL | Status: DC

## 2020-01-13 MED ORDER — ONDANSETRON HCL 4 MG/2ML IJ SOLN: 4.0000 mg | Freq: Four times a day (QID) | INTRAMUSCULAR | Status: DC | PRN

## 2020-01-13 MED ORDER — ASPIRIN 81 MG PO CHEW
81.0000 mg | CHEWABLE_TABLET | ORAL | Status: AC
  Administered 2020-01-13: 07:00:00 81 mg via ORAL
  Filled 2020-01-13: qty 1

## 2020-01-13 MED ORDER — LIDOCAINE HCL (PF) 1 % IJ SOLN
INTRAMUSCULAR | Status: DC | PRN
  Administered 2020-01-13 (×2): 2 mL

## 2020-01-13 MED ORDER — HEPARIN (PORCINE) IN NACL 1000-0.9 UT/500ML-% IV SOLN
INTRAVENOUS | Status: DC | PRN
  Administered 2020-01-13 (×2): 500 mL

## 2020-01-13 MED ORDER — ALBUTEROL SULFATE (2.5 MG/3ML) 0.083% IN NEBU: 2.5000 mg | INHALATION_SOLUTION | Freq: Four times a day (QID) | RESPIRATORY_TRACT | Status: DC | PRN

## 2020-01-13 MED ORDER — HYDROXYZINE HCL 25 MG PO TABS: 25.0000 mg | ORAL_TABLET | Freq: Every day | ORAL | Status: DC

## 2020-01-13 MED ORDER — DULOXETINE HCL 60 MG PO CPEP: 60.0000 mg | ORAL_CAPSULE | Freq: Every day | ORAL | Status: DC

## 2020-01-13 MED ORDER — SODIUM CHLORIDE 0.9 % IV SOLN: INTRAVENOUS | Status: DC

## 2020-01-13 MED ORDER — FUROSEMIDE 20 MG PO TABS: 40.0000 mg | ORAL_TABLET | Freq: Every day | ORAL | Status: DC

## 2020-01-13 MED ORDER — SODIUM CHLORIDE 0.9 % IV SOLN: 250.0000 mL | INTRAVENOUS | Status: DC | PRN

## 2020-01-13 MED ORDER — VERAPAMIL HCL 2.5 MG/ML IV SOLN
INTRAVENOUS | Status: DC | PRN
  Administered 2020-01-13: 10 mL via INTRA_ARTERIAL

## 2020-01-13 MED ORDER — BUPRENORPHINE 7.5 MCG/HR TD PTWK: 7.5000 ug | MEDICATED_PATCH | TRANSDERMAL | Status: DC

## 2020-01-13 MED ORDER — NAPHAZOLINE-GLYCERIN 0.012-0.2 % OP SOLN: 1.0000 [drp] | Freq: Four times a day (QID) | OPHTHALMIC | Status: DC | PRN

## 2020-01-13 MED ORDER — VITAMIN D (ERGOCALCIFEROL) 1.25 MG (50000 UNIT) PO CAPS: 50000.0000 [IU] | ORAL_CAPSULE | ORAL | Status: DC

## 2020-01-13 MED ORDER — MIDAZOLAM HCL 2 MG/2ML IJ SOLN
INTRAMUSCULAR | Status: DC | PRN
  Administered 2020-01-13 (×3): 1 mg via INTRAVENOUS

## 2020-01-13 SURGICAL SUPPLY — 14 items
CATH BALLN WEDGE 5F 110CM (CATHETERS) ×4 IMPLANT
CATH INFINITI 4FR JL3.5 (CATHETERS) ×2 IMPLANT
CATH INFINITI MULTIPACK ANG 4F (CATHETERS) ×2 IMPLANT
DEVICE RAD COMP TR BAND LRG (VASCULAR PRODUCTS) ×2 IMPLANT
GLIDESHEATH SLEND A-KIT 6F 22G (SHEATH) ×2 IMPLANT
GUIDEWIRE INQWIRE 1.5J.035X260 (WIRE) ×1 IMPLANT
INQWIRE 1.5J .035X260CM (WIRE) ×2
KIT HEART LEFT (KITS) ×2 IMPLANT
PACK CARDIAC CATHETERIZATION (CUSTOM PROCEDURE TRAY) ×2 IMPLANT
SHEATH GLIDE SLENDER 4/5FR (SHEATH) ×2 IMPLANT
SHEATH PROBE COVER 6X72 (BAG) ×2 IMPLANT
TRANSDUCER W/STOPCOCK (MISCELLANEOUS) ×2 IMPLANT
TUBING CIL FLEX 10 FLL-RA (TUBING) ×2 IMPLANT
WIRE HI TORQ VERSACORE-J 145CM (WIRE) ×2 IMPLANT

## 2020-01-13 NOTE — Interval H&P Note (Signed)
Cath Lab Visit (complete for each Cath Lab visit)  Clinical Evaluation Leading to the Procedure:   ACS: No.  Non-ACS:    Anginal Classification: CCS III  Anti-ischemic medical therapy: Minimal Therapy (1 class of medications)  Non-Invasive Test Results: Low-risk stress test findings: cardiac mortality <1%/year  Prior CABG: No previous CABG      History and Physical Interval Note:  01/13/2020 7:26 AM  Leslie Sanders  has presented today for surgery, with the diagnosis of chest pain - shortness of breath.  The various methods of treatment have been discussed with the patient and family. After consideration of risks, benefits and other options for treatment, the patient has consented to  Procedure(s): RIGHT/LEFT HEART CATH AND CORONARY ANGIOGRAPHY (N/A) as a surgical intervention.  The patient's history has been reviewed, patient examined, no change in status, stable for surgery.  I have reviewed the patient's chart and labs.  Questions were answered to the patient's satisfaction.     Lyn Records III

## 2020-01-13 NOTE — Progress Notes (Addendum)
Pt stated she thought she would spend the night because of how things were handled with her insurance and cardiology office, pt's daughter is here in the lobby. I have explained this was a test, nothing was done like a stent and is able to go home. Pt and daughter will have a  Ride arranged for them with assistance from [employer].  After pt was done getting ready she states her wrist is throbbing, her wrist is hanging down on her lap, she has been instructed many times to keep right wrist elevated and that will help the throbbing. She can not take tylenol or nsaids, she states she usually uses lidocaine for pain relief, she was told not to put that directly on the site but could apply around, right wrist was soft, no bruising, level 0 , armboard was applied for support.  [facility] was to be here at  1230, pt was wheeled to entrance with her daughter for ride, scheduler stated that the driver will call her // the patient to tell her what color the car was. Pt would not keep her right wrist up on chest or heart level plus continued to use her phone with that hand.

## 2020-01-13 NOTE — Discharge Instructions (Signed)
Radial Site Care  This sheet gives you information about how to care for yourself after your procedure. Your health care provider may also give you more specific instructions. If you have problems or questions, contact your health care provider. What can I expect after the procedure? After the procedure, it is common to have:  Bruising and tenderness at the catheter insertion area. Follow these instructions at home: Medicines  Take over-the-counter and prescription medicines only as told by your health care provider. Insertion site care  Follow instructions from your health care provider about how to take care of your insertion site. Make sure you: ? Wash your hands with soap and water before you change your bandage (dressing). If soap and water are not available, use hand sanitizer. ? Change your dressing as told by your health care provider. ? Leave stitches (sutures), skin glue, or adhesive strips in place. These skin closures may need to stay in place for 2 weeks or longer. If adhesive strip edges start to loosen and curl up, you may trim the loose edges. Do not remove adhesive strips completely unless your health care provider tells you to do that.  Check your insertion site every day for signs of infection. Check for: ? Redness, swelling, or pain. ? Fluid or blood. ? Pus or a bad smell. ? Warmth.  Do not take baths, swim, or use a hot tub until your health care provider approves.  You may shower 24-48 hours after the procedure, or as directed by your health care provider. ? Remove the dressing and gently wash the site with plain soap and water. ? Pat the area dry with a clean towel. ? Do not rub the site. That could cause bleeding.  Do not apply powder or lotion to the site. Activity   For 24 hours after the procedure, or as directed by your health care provider: ? Do not flex or bend the affected arm. ? Do not push or pull heavy objects with the affected arm. ? Do not  drive yourself home from the hospital or clinic. You may drive 24 hours after the procedure unless your health care provider tells you not to. ? Do not operate machinery or power tools.  Do not lift anything that is heavier than 10 lb (4.5 kg), or the limit that you are told, until your health care provider says that it is safe.  Ask your health care provider when it is okay to: ? Return to work or school. ? Resume usual physical activities or sports. ? Resume sexual activity. General instructions  If the catheter site starts to bleed, raise your arm and put firm pressure on the site. If the bleeding does not stop, get help right away. This is a medical emergency.  If you went home on the same day as your procedure, a responsible adult should be with you for the first 24 hours after you arrive home.  Keep all follow-up visits as told by your health care provider. This is important. Contact a health care provider if:  You have a fever.  You have redness, swelling, or yellow drainage around your insertion site. Get help right away if:  You have unusual pain at the radial site.  The catheter insertion area swells very fast.  The insertion area is bleeding, and the bleeding does not stop when you hold steady pressure on the area.  Your arm or hand becomes pale, cool, tingly, or numb. These symptoms may represent a serious problem   that is an emergency. Do not wait to see if the symptoms will go away. Get medical help right away. Call your local emergency services (911 in the [place] Do not drive yourself to the hospital. Summary  After the procedure, it is common to have bruising and tenderness at the site.  Follow instructions from your health care provider about how to take care of your radial site wound. Check the wound every day for signs of infection.  Do not lift anything that is heavier than 10 lb (4.5 kg), or the limit that you are told, until your health care provider says  that it is safe. This information is not intended to replace advice given to you by your health care provider. Make sure you discuss any questions you have with your health care provider. Document Revised: 12/30/2017 Document Reviewed: 12/30/2017 Elsevier Patient Education  2020 Elsevier Inc.  

## 2020-01-13 NOTE — CV Procedure (Signed)
   Left and right heart catheterization via right antecubital vein and radial artery using real-time vascular ultrasound for radial artery access.  Ultrasound imaging of the radial artery demonstrated a quite small vessel diameter causing asked to primarily use 4 French diagnostic catheters.  Normal right heart hemodynamics.  Normal oximetry.  Normal left ventricular function.  Normal coronary arteries.  Hyperpnea noted during the procedure with end-tidal CO2 less than 20 and pH greater than 7.5.

## 2020-01-18 ENCOUNTER — Encounter: Payer: Self-pay | Admitting: Physician Assistant

## 2020-01-18 ENCOUNTER — Ambulatory Visit (INDEPENDENT_AMBULATORY_CARE_PROVIDER_SITE_OTHER): Payer: Medicare Other | Admitting: Physician Assistant

## 2020-01-18 ENCOUNTER — Other Ambulatory Visit: Payer: Self-pay

## 2020-01-18 VITALS — BP 92/70 | HR 72 | Temp 97.9°F | Ht 62.0 in | Wt 132.0 lb

## 2020-01-18 DIAGNOSIS — K219 Gastro-esophageal reflux disease without esophagitis: Secondary | ICD-10-CM

## 2020-01-18 DIAGNOSIS — R112 Nausea with vomiting, unspecified: Secondary | ICD-10-CM

## 2020-01-18 DIAGNOSIS — R1084 Generalized abdominal pain: Secondary | ICD-10-CM

## 2020-01-18 DIAGNOSIS — E662 Morbid (severe) obesity with alveolar hypoventilation: Secondary | ICD-10-CM | POA: Insufficient documentation

## 2020-01-18 DIAGNOSIS — Z9884 Bariatric surgery status: Secondary | ICD-10-CM | POA: Insufficient documentation

## 2020-01-18 DIAGNOSIS — R1310 Dysphagia, unspecified: Secondary | ICD-10-CM

## 2020-01-18 DIAGNOSIS — R6881 Early satiety: Secondary | ICD-10-CM

## 2020-01-18 MED ORDER — GLYCOPYRROLATE 2 MG PO TABS: 2.0000 mg | ORAL_TABLET | Freq: Two times a day (BID) | ORAL | 3 refills | Status: AC

## 2020-01-18 MED ORDER — OMEPRAZOLE 40 MG PO CPDR: 40.0000 mg | DELAYED_RELEASE_CAPSULE | Freq: Every day | ORAL | 3 refills | Status: DC

## 2020-01-18 NOTE — Progress Notes (Signed)
Subjective:    Patient ID: Leslie Sanders, female    DOB: 1976-04-14, 44 y.o.   MRN: [id number]  HPI [name] is a 44 year old African-American female, new to GI today, referred by Maryanna Shape Durward Mallard, DO. Patient has been having multiple medical issues over the past months.  She has history of bipolar disorder, prior EtOH abuse, depression.  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  She has been having a lot of heartburn and indigestion over the past couple of months and a sensation of feeling full all the time.  She has noted increased regurgitation and intermittent solid food dysphagia.  She is also complaining of ongoing gassiness, nausea, occasional vomiting.  Intermittent diarrhea and abdominal cramping.  She says she has been told she had IBS in the past but does not know how to manage it.  She feels that she is not absorbing her nutrients properly. She denies any regular aspirin or NSAID use.  She has been on Protonix 40 mg daily for some time without any improvement in symptoms. She is also been having episodes of right mid abdominal pain which she describes as a rolling sensation.  She says this is very uncomfortable and sometimes she can shift position and have improvement in symptoms.  She is worried about obstruction. She does not think she has had prior colonoscopy or EGD.  Family history is not clear. She has been on iron supplementation and receives B12 monthly post gastric bypass. She had recent ER visit for dyspnea in January 2021.  She has had subsequent pulmonary consultation.  Per notes PFTs showed uncontrolled obstructive lung disease with bronchodilator reversibility.  Her symptoms felt to be out of proportion to her COPD, question possible asthma overlap. She also had recent cardiac catheterization on 01/13/2020 which was negative.  Per Dr. Jeralene Peters Smith's notes hyper  apnea with resultant hyperventilation syndrome suggesting psychiatric or CNS etiology..  Review of Systems Pertinent positive and negative review of systems were noted in the above HPI section.  All other review of systems was otherwise negative.  Outpatient Encounter Medications as of 01/18/2020  Medication Sig  . albuterol (PROAIR HFA) 108 (90 Base) MCG/ACT inhaler Inhale 1 puff into the lungs every 6 (six) hours as needed for wheezing or shortness of breath.  Marland Kitchen albuterol (PROVENTIL) (2.5 MG/3ML) 0.083% nebulizer solution Take 3 mLs (2.5 mg total) by nebulization every 6 (six) hours as needed for wheezing or shortness of breath (Inhale 54m every 6 hours and as needed.).  .Marland Kitchenbudesonide-formoterol (SYMBICORT) 160-4.5 MCG/ACT inhaler Inhale 2 puffs into the lungs 2 (two) times daily.  . Buprenorphine 7.5 MCG/HR PTWK Place 7.5 mcg onto the skin every Monday.   . busPIRone (BUSPAR) 5 MG tablet Take 5 mg by mouth 2 (two) times daily.  . cyanocobalamin (,VITAMIN B-12,) 1000 MCG/ML injection Inject 1 mL into the muscle every 30 (thirty) days.  .Marland Kitchendocusate sodium (COLACE) 100 MG capsule Take 100 mg by mouth daily.  . DULoxetine (CYMBALTA) 60 MG capsule Take 60 mg by mouth daily.   . ferrous sulfate 325 (65 FE) MG tablet Take 1 tablet (325 mg total) by mouth 2 (two) times daily with a meal. (Patient taking differently: Take 325 mg by mouth 3 (three) times daily. )  . fluticasone (FLONASE) 50 MCG/ACT nasal spray Place 2 sprays into both nostrils daily.  . Fluticasone-Salmeterol (  ADVAIR) 250-50 MCG/DOSE AEPB Inhale 1 puff into the lungs 2 (two) times daily.  . folic acid (FOLVITE) 1 MG tablet Take 1 mg by mouth daily.   . hydrOXYzine (ATARAX/VISTARIL) 25 MG tablet Take 1 tablet (25 mg total) by mouth every 6 (six) hours as needed for anxiety. (Patient taking differently: Take 25 mg by mouth daily. )  . montelukast (SINGULAIR) 10 MG tablet Take 10 mg by mouth daily.   . nitroGLYCERIN (NITROSTAT) 0.4 MG SL  tablet Place 1 tablet under the tongue as needed.  . pantoprazole (PROTONIX) 40 MG tablet Take 1 tablet (40 mg total) by mouth daily.  . pregabalin (LYRICA) 150 MG capsule Take 150 mg by mouth 2 (two) times daily.  Marland Kitchen tetrahydrozoline 0.05 % ophthalmic solution Place 1-2 drops into both eyes 3 (three) times daily as needed (irritated eyes.).  Marland Kitchen Tiotropium Bromide Monohydrate (SPIRIVA RESPIMAT) 2.5 MCG/ACT AERS Inhale 1 puff into the lungs daily.  . Vitamin D, Ergocalciferol, (DRISDOL) 1.25 MG (50000 UT) CAPS capsule Take 50,000 Units by mouth every Monday.   Marland Kitchen glycopyrrolate (ROBINUL) 2 MG tablet Take 1 tablet (2 mg total) by mouth 2 (two) times daily.  Marland Kitchen omeprazole (PRILOSEC) 40 MG capsule Take 1 capsule (40 mg total) by mouth daily.   No facility-administered encounter medications on file as of 01/18/2020.   Allergies  Allergen Reactions  . Tylenol [Acetaminophen] Anaphylaxis    Per patient  . Adhesive  [Tape] Rash  . Hydrocodone-Acetaminophen Other (See Comments) and Nausea And Vomiting  . Nsaids Other (See Comments)    S/p bariatric surgery; increased risk of ulcers, gastritis and cancer  . Latex Rash   Patient Active Problem List   Diagnosis Date Noted  . Obesity hypoventilation syndrome ([place]) 01/18/2020  . S/P gastric bypass 01/18/2020  . Atypical chest pain 08/02/2019  . History of smoking 08/02/2019  . Dyspnea on exertion 08/02/2019  . Abnormal EKG 07/19/2019  . Major depression, recurrent ([place]) 11/27/2016  . Bipolar disorder, most recent episode depressed ([place]) 10/12/2016  . Anemia, iron deficiency 10/12/2016  . Alcohol use disorder, moderate, dependence ([place]) 10/12/2016  . Lactose intolerance 10/12/2016   Social History   Socioeconomic History  . Marital status: Divorced    Spouse name: Not on file  . Number of children: Not on file  . Years of education: Not on file  . Highest education level: Not on file  Occupational History  . Not on file  Tobacco Use  .  Smoking status: Former Smoker    Packs/day: 2.00    Years: 23.00    Pack years: 46.00    Types: Cigarettes    Quit date: 12/08/2018    Years since quitting: 1.1  . Smokeless tobacco: Never Used  Substance and Sexual Activity  . Alcohol use: Not Currently    Alcohol/week: 28.0 standard drinks    Types: 28 Cans of beer per week    Comment: Nothing in 4 years  . Drug use: No  . Sexual activity: Not Currently  Other Topics Concern  . Not on file  Social History Narrative  . Not on file   Social Determinants of Health   Financial Resource Strain:   . Difficulty of Paying Living Expenses: Not on file  Food Insecurity:   . Worried About Charity fundraiser in the Last Year: Not on file  . Ran Out of Food in the Last Year: Not on file  Transportation Needs:   . Lack of Transportation (Medical):  Not on file  . Lack of Transportation (Non-Medical): Not on file  Physical Activity:   . Days of Exercise per Week: Not on file  . Minutes of Exercise per Session: Not on file  Stress:   . Feeling of Stress : Not on file  Social Connections:   . Frequency of Communication with Friends and Family: Not on file  . Frequency of Social Gatherings with Friends and Family: Not on file  . Attends Religious Services: Not on file  . Active Member of Clubs or Organizations: Not on file  . Attends Archivist Meetings: Not on file  . Marital Status: Not on file  Intimate Partner Violence:   . Fear of Current or Ex-Partner: Not on file  . Emotionally Abused: Not on file  . Physically Abused: Not on file  . Sexually Abused: Not on file    Ms. Marseille's family history includes ADD / ADHD in her son; Arthritis/Rheumatoid in her mother; Breast cancer in her mother; COPD in her maternal grandmother and paternal grandmother; HIV in her father; Lupus in her mother; ODD in her son; Rheum arthritis in her maternal grandmother; Schizophrenia in her maternal aunt.      Objective:    Vitals:    01/18/20 1440  BP: 92/70  Pulse: 72  Temp: 97.9 F (36.6 C)    Physical Exam Well-developed well-nourished African-American female in no acute distress.  Height, Weight, 132 BMI 24.1.Marland Kitchen She is wearing a soft neck collar and ambulates with a walker  HEENT; nontraumatic normocephalic, EOMI, PER R LA, sclera anicteric. Oropharynx; not examined Neck; supple, no JVD Cardiovascular; regular rate and rhythm with S1-S2, no murmur rub or gallop Pulmonary; Clear bilaterally, tachypneic Abdomen; soft, no focal tenderness, nondistended, no palpable mass or hepatosplenomegaly, bowel sounds are active Rectal; not done today Skin; benign exam, no jaundice rash or appreciable lesions Extremities; no clubbing cyanosis or edema skin warm and dry Neuro/Psych; alert and oriented x4, grossly nonfocal mood and affect appropriate, anxious appearing       Assessment & Plan:   #83 44 year old African-American female with multiple medical issues referred for poorly controlled GERD, intermittent dysphagia, early satiety, nausea intermittent vomiting and gas. She is status post remote Roux-en-Y gastric bypass.  She has been on Protonix 40 mg daily without improvement in symptoms.  Expect she is not absorbing PPI in tablet form post gastric bypass. Rule out gastropathy, peptic ulcer disease, anastomotic ulceration, delayed gastric emptying, anastomotic stricture.  #2 chronic intermittent abdominal cramping and diarrhea.  Patient has a lot of postprandial symptoms, may have a component of dumping syndrome. #3 57-monthhistory of intermittent sharp right abdominal pain-etiology not clear  #4 bipolar disorder #5 history of EtOH abuse #6 hyperpnea--work-up in progress, hyperventilation syndrome negative cardiac catheterization.  She is undergoing evaluation with pulmonary and felt to have COPD possibly with asthma overlap. #7 status post cervical laminectomy  Plan; switch PPI to omeprazole 40 mg capsule, patient  instructed to open the capsule and sprinkle on applesauce or yogurt for best absorption. Add trial of glycopyrrolate 2 mg p.o. twice daily We will schedule for upper endoscopy with Dr. PHenrene Pastor  Procedure discussed in detail with patient including indications risks and benefits and she is agreeable to proceed. Schedule for CT scan of the abdomen and pelvis with contrast. Recent labs reviewed, normal CBC and be met 01/09/2020  Plan    Siddharth Babington S Arnaldo Heffron PA-C 01/18/2020   Cc: SSinclair Ship MD

## 2020-01-18 NOTE — Patient Instructions (Addendum)
If you are age 44 or older, your body mass index should be between 23-30. Your Body mass index is 24.14 kg/m. If this is out of the aforementioned range listed, please consider follow up with your Primary Care Provider.  If you are age 75 or younger, your body mass index should be between 19-25. Your Body mass index is 24.14 kg/m. If this is out of the aformentioned range listed, please consider follow up with your Primary Care Provider.    We have sent the following medications to your pharmacy for you to pick up at your convenience: Omeprazole 40 mg open capsule and sprinkle on small amount of apple sauce daily.  Glycopyrrolate 2 mg twice daily for abdominal cramping.  You have been scheduled for a CT scan of the abdomen and pelvis at [facility] (1st radiology). [phone]   You are scheduled on Wednesday 01/25/20 at 11 am. You should arrive 15 minutes prior to your appointment time for registration. Please follow the written instructions below on the day of your exam:  WARNING: IF YOU ARE ALLERGIC TO IODINE/X-RAY DYE, PLEASE NOTIFY RADIOLOGY IMMEDIATELY AT [phone]! YOU WILL BE GIVEN A 13 HOUR PREMEDICATION PREP.  1) Do not eat or drink anything after 7 am (4 hours prior to your test) 2) You have been given 2 bottles of oral contrast to drink. The solution may taste better if refrigerated, but do NOT add ice or any other liquid to this solution. Shake well before drinking.    Drink 1 bottle of contrast @ 9 am (2 hours prior to your exam)  Drink 1 bottle of contrast @ 10 am (1 hour prior to your exam)  You may take any medications as prescribed with a small amount of water, if necessary. If you take any of the following medications: METFORMIN, GLUCOPHAGE, GLUCOVANCE, AVANDAMET, RIOMET, FORTAMET, [place] MET, JANUMET, GLUMETZA or METAGLIP, you MAY be asked to HOLD this medication 48 hours AFTER the exam.  The purpose of you drinking the oral contrast is to aid in the  visualization of your intestinal tract. The contrast solution may cause some diarrhea. Depending on your individual set of symptoms, you may also receive an intravenous injection of x-ray contrast/dye. This test typically takes 30-45 minutes to complete. ___________________________________________________________

## 2020-01-19 NOTE — Progress Notes (Signed)
Assessment and plans reviewed  

## 2020-01-25 ENCOUNTER — Ambulatory Visit (HOSPITAL_COMMUNITY): Payer: Medicare Other

## 2020-01-30 ENCOUNTER — Other Ambulatory Visit: Payer: Self-pay | Admitting: Internal Medicine

## 2020-01-30 ENCOUNTER — Other Ambulatory Visit: Payer: Self-pay

## 2020-01-30 ENCOUNTER — Ambulatory Visit (INDEPENDENT_AMBULATORY_CARE_PROVIDER_SITE_OTHER): Payer: Medicare Other

## 2020-01-30 DIAGNOSIS — K909 Intestinal malabsorption, unspecified: Secondary | ICD-10-CM | POA: Insufficient documentation

## 2020-01-30 DIAGNOSIS — Z1159 Encounter for screening for other viral diseases: Secondary | ICD-10-CM

## 2020-01-30 DIAGNOSIS — G43909 Migraine, unspecified, not intractable, without status migrainosus: Secondary | ICD-10-CM | POA: Insufficient documentation

## 2020-01-31 ENCOUNTER — Other Ambulatory Visit: Payer: Self-pay

## 2020-01-31 ENCOUNTER — Ambulatory Visit (HOSPITAL_COMMUNITY)
Admission: RE | Admit: 2020-01-31 | Discharge: 2020-01-31 | Disposition: A | Discharge status: Home / Self Care | Payer: Medicare Other | Source: Ambulatory Visit | Attending: Physician Assistant | Admitting: Physician Assistant

## 2020-01-31 DIAGNOSIS — R1310 Dysphagia, unspecified: Secondary | ICD-10-CM

## 2020-01-31 DIAGNOSIS — R6881 Early satiety: Secondary | ICD-10-CM | POA: Diagnosis present

## 2020-01-31 DIAGNOSIS — R1084 Generalized abdominal pain: Secondary | ICD-10-CM | POA: Diagnosis present

## 2020-01-31 DIAGNOSIS — R112 Nausea with vomiting, unspecified: Secondary | ICD-10-CM

## 2020-01-31 DIAGNOSIS — K219 Gastro-esophageal reflux disease without esophagitis: Secondary | ICD-10-CM

## 2020-01-31 LAB — SARS CORONAVIRUS 2 (TAT 6-24 HRS): SARS Coronavirus 2: NEGATIVE

## 2020-01-31 MED ORDER — IOHEXOL 300 MG/ML  SOLN
100.0000 mL | Freq: Once | INTRAMUSCULAR | Status: AC | PRN
  Administered 2020-01-31: 15:00:00 100 mL via INTRAVENOUS

## 2020-01-31 MED ORDER — SODIUM CHLORIDE (PF) 0.9 % IJ SOLN
INTRAMUSCULAR | Status: AC
  Filled 2020-01-31: qty 50

## 2020-02-01 ENCOUNTER — Encounter: Payer: Self-pay | Admitting: Internal Medicine

## 2020-02-01 ENCOUNTER — Ambulatory Visit (AMBULATORY_SURGERY_CENTER): Payer: Medicare Other | Admitting: Internal Medicine

## 2020-02-01 VITALS — BP 109/71 | HR 64 | Temp 96.8°F | Resp 13 | Ht 62.0 in | Wt 132.0 lb

## 2020-02-01 DIAGNOSIS — R1084 Generalized abdominal pain: Secondary | ICD-10-CM | POA: Diagnosis not present

## 2020-02-01 DIAGNOSIS — R1310 Dysphagia, unspecified: Secondary | ICD-10-CM

## 2020-02-01 DIAGNOSIS — K219 Gastro-esophageal reflux disease without esophagitis: Secondary | ICD-10-CM

## 2020-02-01 MED ORDER — SODIUM CHLORIDE 0.9 % IV SOLN: 500.0000 mL | Freq: Once | INTRAVENOUS | Status: DC

## 2020-02-01 NOTE — Progress Notes (Signed)
Pt's states no medical or surgical changes since previsit or office visit.  Temp- June Vitals- Karina 

## 2020-02-01 NOTE — Progress Notes (Signed)
PT taken to PACU. Monitors in place. VSS. Report given to RN. 

## 2020-02-01 NOTE — Patient Instructions (Signed)
YOU HAD AN ENDOSCOPIC PROCEDURE TODAY AT THE [facility]:   Refer to the procedure report that was given to you for any specific questions about what was found during the examination.  If the procedure report does not answer your questions, please call your gastroenterologist to clarify.  If you requested that your care partner not be given the details of your procedure findings, then the procedure report has been included in a sealed envelope for you to review at your convenience later.  YOU SHOULD EXPECT: Some feelings of bloating in the abdomen. Passage of more gas than usual.  Walking can help get rid of the air that was put into your GI tract during the procedure and reduce the bloating. If you had a lower endoscopy (such as a colonoscopy or flexible sigmoidoscopy) you may notice spotting of blood in your stool or on the toilet paper. If you underwent a bowel prep for your procedure, you may not have a normal bowel movement for a few days.  Please Note:  You might notice some irritation and congestion in your nose or some drainage.  This is from the oxygen used during your procedure.  There is no need for concern and it should clear up in a day or so.  SYMPTOMS TO REPORT IMMEDIATELY:     Following upper endoscopy (EGD)  Vomiting of blood or coffee ground material  New chest pain or pain under the shoulder blades  Painful or persistently difficult swallowing  New shortness of breath  Fever of 100F or higher  Black, tarry-looking stools  For urgent or emergent issues, a gastroenterologist can be reached at any hour by calling [phone].   DIET:  We do recommend a small meal at first, but then you may proceed to your regular diet.  Drink plenty of fluids but you should avoid alcoholic beverages for 24 hours.  ACTIVITY:  You should plan to take it easy for the rest of today and you should NOT DRIVE or use heavy machinery until tomorrow (because of the sedation medicines  used during the test).    FOLLOW UP: Our staff will call the number listed on your records 48-72 hours following your procedure to check on you and address any questions or concerns that you may have regarding the information given to you following your procedure. If we do not reach you, we will leave a message.  We will attempt to reach you two times.  During this call, we will ask if you have developed any symptoms of COVID 19. If you develop any symptoms (ie: fever, flu-like symptoms, shortness of breath, cough etc.) before then, please call [phone].  If you test positive for Covid 19 in the 2 weeks post procedure, please call and report this information to [place].    If any biopsies were taken you will be contacted by phone or by letter within the next 1-3 weeks.  Please call us at [phone] if you have not heard about the biopsies in 3 weeks.    SIGNATURES/CONFIDENTIALITY: You and/or your care partner have signed paperwork which will be entered into your electronic medical record.  These signatures attest to the fact that that the information above on your After Visit Summary has been reviewed and is understood.  Full responsibility of the confidentiality of this discharge information lies with you and/or your care-partner.   Resume medications. Follow up with gynecologist in [facility].

## 2020-02-01 NOTE — Op Note (Signed)
[facility] Patient Name: Leslie Sanders Procedure Date: 02/01/2020 1:28 PM MRN: [id number] Endoscopist: Wilhemina Bonito. Marina Goodell , MD Age: 44 Referring MD:  Date of Birth: 1976/03/18 Gender: Female Account #: [id number] Procedure:                Upper GI endoscopy Indications:              Abdominal pain, Dysphagia. Recent CT scan was                            normal except for a left adnexal cyst for which                            follow-up pelvic ultrasound was recommended. I                            discussed this with the patient in detail prior to                            beginning 2 days procedure. She understands and                            agrees to follow-up with her local gynecologist Medicines:                Monitored Anesthesia Care Procedure:                Pre-Anesthesia Assessment:                           - Prior to the procedure, a History and Physical                            was performed, and patient medications and                            allergies were reviewed. The patient's tolerance of                            previous anesthesia was also reviewed. The risks                            and benefits of the procedure and the sedation                            options and risks were discussed with the patient.                            All questions were answered, and informed consent                            was obtained. Prior Anticoagulants: The patient has                            taken no previous anticoagulant or antiplatelet  agents. ASA Grade Assessment: II - A patient with                            mild systemic disease. After reviewing the risks                            and benefits, the patient was deemed in                            satisfactory condition to undergo the procedure.                           After obtaining informed consent, the endoscope was                            passed under  direct vision. Throughout the                            procedure, the patient's blood pressure, pulse, and                            oxygen saturations were monitored continuously. The                            Endoscope was introduced through the mouth, and                            advanced to the jejunum. The upper GI endoscopy was                            accomplished without difficulty. The patient                            tolerated the procedure well. Scope In: Scope Out: Findings:                 The esophagus was normal.                           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 The gastric pouch was                            unremarkable. The anastomosis was widely patent                            without abnormalities. The post anastomotic jejunum                            was also normal. Complications:            No immediate complications. Estimated Blood Loss:     Estimated blood loss: none. Impression:  1. 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 Recent CT scan normal except for left adnexal                            cyst. Recommendation:           1. Patient has a contact number available for                            emergencies. The signs and symptoms of potential                            delayed complications were discussed with the                            patient. Return to normal activities tomorrow.                            Written discharge instructions were provided to the                            patient.                           2. Resume previous diet.                           3. Continue present medications.                           4. Return to the care of your primary provider                           5. Follow-up with your gynecologist in [facility] regarding left adnexal cyst, as we                            discussed. Our radiologist recommended a pelvic                            ultrasound in 6 to 12 weeks. Wilhemina Bonito. Marina Goodell, MD 02/01/2020 1:45:51 PM This report has been signed electronically.

## 2020-02-02 ENCOUNTER — Ambulatory Visit (INDEPENDENT_AMBULATORY_CARE_PROVIDER_SITE_OTHER): Payer: Medicare Other

## 2020-02-02 ENCOUNTER — Ambulatory Visit (INDEPENDENT_AMBULATORY_CARE_PROVIDER_SITE_OTHER): Payer: Medicare Other | Admitting: Cardiology

## 2020-02-02 ENCOUNTER — Encounter: Payer: Self-pay | Admitting: Cardiology

## 2020-02-02 ENCOUNTER — Other Ambulatory Visit: Payer: Self-pay

## 2020-02-02 VITALS — BP 120/72 | HR 65 | Ht 62.0 in | Wt 130.4 lb

## 2020-02-02 DIAGNOSIS — R0602 Shortness of breath: Secondary | ICD-10-CM

## 2020-02-02 DIAGNOSIS — F3130 Bipolar disorder, current episode depressed, mild or moderate severity, unspecified: Secondary | ICD-10-CM

## 2020-02-02 DIAGNOSIS — R0600 Dyspnea, unspecified: Secondary | ICD-10-CM

## 2020-02-02 DIAGNOSIS — R002 Palpitations: Secondary | ICD-10-CM

## 2020-02-02 DIAGNOSIS — R42 Dizziness and giddiness: Secondary | ICD-10-CM

## 2020-02-02 DIAGNOSIS — R0789 Other chest pain: Secondary | ICD-10-CM

## 2020-02-02 DIAGNOSIS — R0609 Other forms of dyspnea: Secondary | ICD-10-CM

## 2020-02-02 NOTE — Progress Notes (Signed)
Cardiology Office Note:    Date:  02/02/2020   ID:  Leslie Sanders, DOB 1976-08-30, MRN [id number]  PCP:  Lynn Ito, MD  Cardiologist:  Gypsy Balsam, MD  Electrophysiologist:  None   Referring MD: Lynn Ito, MD   Follow-up  History of Present Illness:    Leslie Sanders is a 44 y.o. female with a hx of vitamin B12 deficiency, former smoker, hypertension and diastolic dysfunction, anxiety and COPD presents for follow-up.  I did see the patient January 09, 2020 and during her evaluation she reported significant shortness of breath and worsening chest pain which are out of proportion therefore I proceeded directly to right and left heart catheterization.  She did undergo her catheterization which showed normal coronaries, with a low capillary wedge pressure as well as normal pressures in the right heart.  Today she reports that she shortness of breath has become significantly increased and she also is now expressing intermittent palpitations with dizziness.  She states that the chest pain has resolved.  She states that she get a bump intermittent palpitations ask sounds with the abrupt onset of fast heartbeat that lasts a few seconds prior to resolution.  The dizziness is separate which she tells me that her most bothersome symptom is more the dizziness than anything else.   Past Medical History:  Diagnosis Date  . Anemia    (a) chronic blood loss hx of menorrhagia s/p endometrial biopsy 04/2015 (b) iron deficient  . Anxiety   . B12 deficiency   . Bipolar 1 disorder (HCC)   . Cervical spinal stenosis   . Cervical spondylosis without myelopathy 07/08/2016  . Chronic pain of right thumb 04/28/2018  . Depression   . GERD (gastroesophageal reflux disease)   . Hemorrhoids    Pt with constipation secondary to iron supplement. Pt has had rectal bleed secondary to internal hemorrhoids. Pt is s/p EGD/colonoscopy 05/2012  . 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 Migraine   . PONV  (postoperative nausea and vomiting)   . PTSD (post-traumatic stress disorder)   . Seasonal allergies   . Vitamin E deficiency   . Zinc deficiency     Past Surgical History:  Procedure Laterality Date  . ABDOMINAL HYSTERECTOMY    . BREAST LUMPECTOMY Bilateral   . BREAST REDUCTION SURGERY    . Carpal Metacarpal Arthroplasty Right 05/13/2019  . CHOLECYSTECTOMY  1990  . HAND SURGERY    . HYSTERECTOMY ABDOMINAL WITH SALPINGECTOMY  11/2017  . POSTERIOR CERVICAL LAMINECTOMY Bilateral 05/23/2019  . RIGHT/LEFT HEART CATH AND CORONARY ANGIOGRAPHY N/A 01/13/2020   Procedure: RIGHT/LEFT HEART CATH AND CORONARY ANGIOGRAPHY;  Surgeon: Lyn Records, MD;  Location: [facility];  Service: Cardiovascular;  Laterality: N/A;  . ROUX-EN-Y GASTRIC BYPASS  2003   in [place]  . SPINAL FUSION  05/23/2019    Current Medications: Current Meds  Medication Sig  . albuterol (PROAIR HFA) 108 (90 Base) MCG/ACT inhaler Inhale 1 puff into the lungs every 6 (six) hours as needed for wheezing or shortness of breath.  Marland Kitchen albuterol (PROVENTIL) (2.5 MG/3ML) 0.083% nebulizer solution Take 3 mLs (2.5 mg total) by nebulization every 6 (six) hours as needed for wheezing or shortness of breath (Inhale 58ml every 6 hours and as needed.).  Marland Kitchen budesonide-formoterol (SYMBICORT) 160-4.5 MCG/ACT inhaler Inhale 2 puffs into the lungs 2 (two) times daily.  . busPIRone (BUSPAR) 5 MG tablet Take 5 mg by mouth 2 (two) times daily.  . cyanocobalamin (,  VITAMIN B-12,) 1000 MCG/ML injection Inject 1 mL into the muscle every 30 (thirty) days.  Marland Kitchen docusate sodium (COLACE) 100 MG capsule Take 100 mg by mouth daily.  . DULoxetine (CYMBALTA) 60 MG capsule Take 60 mg by mouth daily.   . ferrous sulfate 325 (65 FE) MG tablet Take 1 tablet (325 mg total) by mouth 2 (two) times daily with a meal.  . fluticasone (FLONASE) 50 MCG/ACT nasal spray Place 2 sprays into both nostrils daily.  . Fluticasone-Salmeterol (ADVAIR) 250-50 MCG/DOSE AEPB Inhale 1  puff into the lungs 2 (two) times daily.  . folic acid (FOLVITE) 1 MG tablet Take 1 mg by mouth daily.   Marland Kitchen glycopyrrolate (ROBINUL) 2 MG tablet Take 1 tablet (2 mg total) by mouth 2 (two) times daily.  . hydrOXYzine (ATARAX/VISTARIL) 25 MG tablet Take 1 tablet (25 mg total) by mouth every 6 (six) hours as needed for anxiety.  . montelukast (SINGULAIR) 10 MG tablet Take 10 mg by mouth daily.   Marland Kitchen omeprazole (PRILOSEC) 40 MG capsule Take 1 capsule (40 mg total) by mouth daily.  . pantoprazole (PROTONIX) 40 MG tablet Take 1 tablet (40 mg total) by mouth daily.  . pregabalin (LYRICA) 150 MG capsule Take 150 mg by mouth 2 (two) times daily.  . Tiotropium Bromide Monohydrate (SPIRIVA RESPIMAT) 2.5 MCG/ACT AERS Inhale 1 puff into the lungs daily.  . Vitamin D, Ergocalciferol, (DRISDOL) 1.25 MG (50000 UT) CAPS capsule Take 50,000 Units by mouth every Monday.   . [DISCONTINUED] nitroGLYCERIN (NITROSTAT) 0.4 MG SL tablet Place 1 tablet under the tongue as needed.     Allergies:   Tylenol [acetaminophen], Adhesive  [tape], Hydrocodone-acetaminophen, Nsaids, and Latex   Social History   Socioeconomic History  . Marital status: Divorced    Spouse name: Not on file  . Number of children: Not on file  . Years of education: Not on file  . Highest education level: Not on file  Occupational History  . Not on file  Tobacco Use  . Smoking status: Former Smoker    Packs/day: 2.00    Years: 23.00    Pack years: 46.00    Types: Cigarettes    Quit date: 12/08/2018    Years since quitting: 1.1  . Smokeless tobacco: Never Used  Substance and Sexual Activity  . Alcohol use: Not Currently    Alcohol/week: 28.0 standard drinks    Types: 28 Cans of beer per week    Comment: Nothing in 4 years  . Drug use: No  . Sexual activity: Not Currently  Other Topics Concern  . Not on file  Social History Narrative  . Not on file   Social Determinants of Health   Financial Resource Strain:   . Difficulty of  Paying Living Expenses: Not on file  Food Insecurity:   . Worried About Charity fundraiser in the Last Year: Not on file  . Ran Out of Food in the Last Year: Not on file  Transportation Needs:   . Lack of Transportation (Medical): Not on file  . Lack of Transportation (Non-Medical): Not on file  Physical Activity:   . Days of Exercise per Week: Not on file  . Minutes of Exercise per Session: Not on file  Stress:   . Feeling of Stress : Not on file  Social Connections:   . Frequency of Communication with Friends and Family: Not on file  . Frequency of Social Gatherings with Friends and Family: Not on file  .  Attends Religious Services: Not on file  . Active Member of Clubs or Organizations: Not on file  . Attends Banker Meetings: Not on file  . Marital Status: Not on file     Family History: The patient's family history includes ADD / ADHD in her son; Arthritis/Rheumatoid in her mother; Breast cancer in her mother; COPD in her maternal grandmother and paternal grandmother; HIV in her father; Lupus in her mother; ODD in her son; Rheum arthritis in her maternal grandmother; Schizophrenia in her maternal aunt.  ROS:   Review of Systems  Constitution: Negative for decreased appetite, fever and weight gain.  HENT: Negative for congestion, ear discharge, hoarse voice and sore throat.   Eyes: Negative for discharge, redness, vision loss in right eye and visual halos.  Cardiovascular: Negative for chest pain, dyspnea on exertion, leg swelling, orthopnea and palpitations.  Respiratory: Negative for cough, hemoptysis, shortness of breath and snoring.   Endocrine: Negative for heat intolerance and polyphagia.  Hematologic/Lymphatic: Negative for bleeding problem. Does not bruise/bleed easily.  Skin: Negative for flushing, nail changes, rash and suspicious lesions.  Musculoskeletal: Negative for arthritis, joint pain, muscle cramps, myalgias, neck pain and stiffness.    Gastrointestinal: Negative for abdominal pain, bowel incontinence, diarrhea and excessive appetite.  Genitourinary: Negative for decreased libido, genital sores and incomplete emptying.  Neurological: Negative for brief paralysis, focal weakness, headaches and loss of balance.  Psychiatric/Behavioral: Negative for altered mental status, depression and suicidal ideas.  Allergic/Immunologic: Negative for HIV exposure and persistent infections.    EKGs/Labs/Other Studies Reviewed:    The following studies were reviewed today:   EKG: None today   Right and left heart catheterization  Normal pulmonary artery pressures  Low pulmonary capillary wedge pressure, 6 mmHg.  Right dominant coronary artery anatomy.  Normal coronary arteries without evidence of obstruction or luminal irregularity  Normal left ventricular filling pressures.  Normal left ventricular systolic function.  Normal cardiac output.    Oximetry did not reveal any evidence of left to right shunting  RECOMMENDATIONS:   Hyperpnea with resultant hyperventilation syndrome.  Findings after reviewing all studies suggest psychiatric or central nervous system etiology.  Discontinuation of diuretic therapy would seem appropriate given relatively low filling pressures.  Recent Labs: 12/20/2019: NT-Pro BNP 60 01/09/2020: BUN 10; Creatinine, Ser 0.67; Platelets 399 01/13/2020: Hemoglobin 10.9; Potassium 3.1; Sodium 143  Recent Lipid Panel    Component Value Date/Time   CHOL 173 11/29/2016 0644   TRIG 84 11/29/2016 0644   HDL 88 11/29/2016 0644   CHOLHDL 2.0 11/29/2016 0644   VLDL 17 11/29/2016 0644   LDLCALC 68 11/29/2016 0644    Physical Exam:    VS:  BP 120/72   Pulse 65   Ht 5\' 2"  (1.575 m)   Wt 130 lb 6.4 oz (59.1 kg)   LMP 10/07/2016   SpO2 99%   BMI 23.85 kg/m     Wt Readings from Last 3 Encounters:  02/02/20 130 lb 6.4 oz (59.1 kg)  02/01/20 132 lb (59.9 kg)  01/18/20 132 lb (59.9 kg)     GEN:  Well nourished, well developed in no acute distress HEENT: Normal NECK: No JVD; No carotid bruits LYMPHATICS: No lymphadenopathy CARDIAC: S1S2 noted,RRR, no murmurs, rubs, gallops RESPIRATORY:  Clear to auscultation without rales, wheezing or rhonchi  ABDOMEN: Soft, non-tender, non-distended, +bowel sounds, no guarding. EXTREMITIES: No edema, No cyanosis, no clubbing MUSCULOSKELETAL:  No deformity  SKIN: Warm and dry NEUROLOGIC:  Alert and oriented x  3, non-focal PSYCHIATRIC:  Normal affect, good insight  ASSESSMENT:    1. Palpitations   2. Shortness of breath   3. Bipolar disorder, most recent episode depressed (HCC)   4. Dyspnea on exertion   5. Dizziness   6. Atypical chest pain    PLAN:     Thankfully her right and left heart catheterization within normal limits.  But there still remain more questions as to why she is short of breath as well as now has become dizzy with intermittent palpitations.  In terms of the shortness of breath she is following with pulmonary who have diagnosed her with COPD and she is continue to take more inhalers.  She tells me that she has been scheduled for repeat pulmonary function test on March 15, 2019 and will follow up with office visit.  For her dizziness intermittent palpitation we will place the patient on a 14-day ZIO monitor to understand if there is any arrhythmias, heart rate excursions that may be contributing to her nose.  We will be able to get more information once the patient gets her lung test as well as follows up with her podiatrist at [facility] behavior center as she may also need her antianxiety medication as well as antidepressants uptitrated.  The patient is in agreement with the above plan. The patient left the office in stable condition.  The patient will follow up in 2 months or sooner if needed.  Medication Adjustments/Labs and Tests Ordered: Current medicines are reviewed at length with the patient today.  Concerns regarding  medicines are outlined above.  Orders Placed This Encounter  Procedures  . LONG TERM MONITOR-LIVE TELEMETRY (3-14 DAYS)   No orders of the defined types were placed in this encounter.   Patient Instructions  Medication Instructions:  Your physician has recommended you make the following change in your medication:  Stop Taking Nitro   *If you need a refill on your cardiac medications before your next appointment, please call your pharmacy*  Lab Work: NONE   If you have labs (blood work) drawn today and your tests are completely normal, you will receive your results only by: Marland Kitchen MyChart Message (if you have MyChart) OR . A paper copy in the mail If you have any lab test that is abnormal or we need to change your treatment, we will call you to review the results.  Testing/Procedures: Your physician has recommended that you wear an event monitor. Event monitors are medical devices that record the heart's electrical activity. Doctors most often [place] these monitors to diagnose arrhythmias. Arrhythmias are problems with the speed or rhythm of the heartbeat. The monitor is a small, portable device. You can wear one while you do your normal daily activities. This is usually used to diagnose what is causing palpitations/syncope (passing out).    Follow-Up: At [facility], you and your health needs are our priority.  As part of our continuing mission to provide you with exceptional heart care, we have created designated Provider Care Teams.  These Care Teams include your primary Cardiologist (physician) and Advanced Practice Providers (APPs -  Physician Assistants and Nurse Practitioners) who all work together to provide you with the care you need, when you need it.  Your next appointment:   2 month(s)  The format for your next appointment:   In Person  Provider:   Thomasene Ripple, DO  Other Instructions Thank you for choosing [facility]!       Adopting a Healthy  Lifestyle.  Know what a healthy weight is for you (roughly BMI <25) and aim to maintain this   Aim for 7+ servings of fruits and vegetables daily   65-80+ fluid ounces of water or unsweet tea for healthy kidneys   Limit to max 1 drink of alcohol per day; avoid smoking/tobacco   Limit animal fats in diet for cholesterol and heart health - choose grass fed whenever available   Avoid highly processed foods, and foods high in saturated/trans fats   Aim for low stress - take time to unwind and care for your mental health   Aim for 150 min of moderate intensity exercise weekly for heart health, and weights twice weekly for bone health   Aim for 7-9 hours of sleep daily   When it comes to diets, agreement about the perfect plan isnt easy to find, even among the experts. Experts at the [facility] of [employer] developed an idea known as the Healthy Eating Plate. Just imagine a plate divided into logical, healthy portions.   The emphasis is on diet quality:   Load up on vegetables and fruits - one-half of your plate: Aim for color and variety, and remember that potatoes dont count.   Go for whole grains - one-quarter of your plate: Whole wheat, barley, wheat berries, quinoa, oats, brown rice, and foods made with them. If you want pasta, go with whole wheat pasta.   Protein power - one-quarter of your plate: Fish, chicken, beans, and nuts are all healthy, versatile protein sources. Limit red meat.   The diet, however, does go beyond the plate, offering a few other suggestions.   Use healthy plant oils, such as olive, canola, soy, corn, sunflower and peanut. Check the labels, and avoid partially hydrogenated oil, which have unhealthy trans fats.   If youre thirsty, drink water. Coffee and tea are good in moderation, but skip sugary drinks and limit milk and dairy products to one or two daily servings.   The type of carbohydrate in the diet is more important than the amount. Some  sources of carbohydrates, such as vegetables, fruits, whole grains, and beans-are healthier than others.   Finally, stay active  Signed, Thomasene Ripple, DO  02/02/2020 4:31 PM    [facility]

## 2020-02-02 NOTE — Patient Instructions (Signed)
Medication Instructions:  Your physician has recommended you make the following change in your medication:  Stop Taking Nitro   *If you need a refill on your cardiac medications before your next appointment, please call your pharmacy*  Lab Work: NONE   If you have labs (blood work) drawn today and your tests are completely normal, you will receive your results only by: Marland Kitchen MyChart Message (if you have MyChart) OR . A paper copy in the mail If you have any lab test that is abnormal or we need to change your treatment, we will call you to review the results.  Testing/Procedures: Your physician has recommended that you wear an event monitor. Event monitors are medical devices that record the heart's electrical activity. Doctors most often [place] these monitors to diagnose arrhythmias. Arrhythmias are problems with the speed or rhythm of the heartbeat. The monitor is a small, portable device. You can wear one while you do your normal daily activities. This is usually used to diagnose what is causing palpitations/syncope (passing out).    Follow-Up: At [facility], you and your health needs are our priority.  As part of our continuing mission to provide you with exceptional heart care, we have created designated Provider Care Teams.  These Care Teams include your primary Cardiologist (physician) and Advanced Practice Providers (APPs -  Physician Assistants and Nurse Practitioners) who all work together to provide you with the care you need, when you need it.  Your next appointment:   2 month(s)  The format for your next appointment:   In Person  Provider:   Thomasene Ripple, DO  Other Instructions Thank you for choosing [facility]!

## 2020-02-03 ENCOUNTER — Encounter: Payer: Self-pay | Admitting: *Deleted

## 2020-02-03 ENCOUNTER — Telehealth: Payer: Self-pay

## 2020-02-03 ENCOUNTER — Telehealth: Payer: Self-pay | Admitting: *Deleted

## 2020-02-03 NOTE — Telephone Encounter (Signed)
Left message for pt on vm, first attempt follow up call to pt

## 2020-02-03 NOTE — Telephone Encounter (Signed)
  Follow up Call-  Call back number 02/01/2020  Post procedure Call Back phone  # [phone]  Permission to leave phone message Yes  Some recent data might be hidden     Patient questions:  Do you have a fever, pain , or abdominal swelling? No. Pain Score  0 *  Have you tolerated food without any problems? Yes.    Have you been able to return to your normal activities? Yes.    Do you have any questions about your discharge instructions: Diet   No. Medications  No. Follow up visit  No.  Do you have questions or concerns about your Care? No.  Actions: * If pain score is 4 or above: No action needed, pain <4.  1. Have you developed a fever since your procedure? no  2.   Have you had an respiratory symptoms (SOB or cough) since your procedure? no  3.   Have you tested positive for COVID 19 since your procedure no  4.   Have you had any family members/close contacts diagnosed with the COVID 19 since your procedure?  no   If yes to any of these questions please route to Laverna Peace, RN and Jennye Boroughs, Charity fundraiser.

## 2020-02-29 NOTE — Addendum Note (Signed)
Addended by: Roosvelt Harps R on: 02/29/2020 04:52 PM   Modules accepted: Orders

## 2020-03-02 ENCOUNTER — Ambulatory Visit: Payer: Medicare Other | Attending: Internal Medicine

## 2020-03-02 DIAGNOSIS — Z23 Encounter for immunization: Secondary | ICD-10-CM

## 2020-03-02 NOTE — Progress Notes (Signed)
   Covid-19 Vaccination Clinic  Name:  VALORA NORELL    MRN: [id number] DOB: 10-18-1976  03/02/2020  Ms. Zawadzki was observed post Covid-19 immunization for 15 minutes without incident. She was provided with Vaccine Information Sheet and instruction to access the V-Safe system.   Ms. Ohanian was instructed to call 911 with any severe reactions post vaccine: Marland Kitchen Difficulty breathing  . Swelling of face and throat  . A fast heartbeat  . A bad rash all over body  . Dizziness and weakness   Immunizations Administered    Name Date Dose VIS Date Route   Pfizer COVID-19 Vaccine 03/02/2020  1:34 PM 0.3 mL 11/18/2019 Intramuscular   Manufacturer: [employer], [employer]   Lot: [id number]   NDC: [address]-7

## 2020-03-10 ENCOUNTER — Other Ambulatory Visit (HOSPITAL_COMMUNITY)
Admission: RE | Admit: 2020-03-10 | Discharge: 2020-03-10 | Disposition: A | Discharge status: Home / Self Care | Payer: Medicare Other | Source: Ambulatory Visit | Attending: Critical Care Medicine | Admitting: Critical Care Medicine

## 2020-03-10 DIAGNOSIS — Z20822 Contact with and (suspected) exposure to covid-19: Secondary | ICD-10-CM | POA: Insufficient documentation

## 2020-03-10 DIAGNOSIS — Z01812 Encounter for preprocedural laboratory examination: Secondary | ICD-10-CM | POA: Diagnosis present

## 2020-03-10 LAB — SARS CORONAVIRUS 2 (TAT 6-24 HRS): SARS Coronavirus 2: NEGATIVE

## 2020-03-14 ENCOUNTER — Encounter: Payer: Self-pay | Admitting: Critical Care Medicine

## 2020-03-14 ENCOUNTER — Other Ambulatory Visit: Payer: Self-pay

## 2020-03-14 ENCOUNTER — Ambulatory Visit: Payer: Medicare Other | Admitting: Critical Care Medicine

## 2020-03-14 ENCOUNTER — Ambulatory Visit (INDEPENDENT_AMBULATORY_CARE_PROVIDER_SITE_OTHER): Payer: Medicare Other | Admitting: Critical Care Medicine

## 2020-03-14 VITALS — BP 108/66 | HR 84 | Ht 62.0 in | Wt 132.0 lb

## 2020-03-14 DIAGNOSIS — J309 Allergic rhinitis, unspecified: Secondary | ICD-10-CM | POA: Diagnosis not present

## 2020-03-14 DIAGNOSIS — R0602 Shortness of breath: Secondary | ICD-10-CM

## 2020-03-14 DIAGNOSIS — J4540 Moderate persistent asthma, uncomplicated: Secondary | ICD-10-CM

## 2020-03-14 DIAGNOSIS — J4550 Severe persistent asthma, uncomplicated: Secondary | ICD-10-CM

## 2020-03-14 LAB — PULMONARY FUNCTION TEST
DL/VA % pred: 103 %
DL/VA: 4.6 ml/min/mmHg/L
DLCO cor % pred: 82 %
DLCO cor: 16.53 ml/min/mmHg
DLCO unc % pred: 82 %
DLCO unc: 16.53 ml/min/mmHg
FEF 25-75 Post: 1.47 L/sec
FEF 25-75 Pre: 0.8 L/sec
FEF2575-%Change-Post: 83 %
FEF2575-%Pred-Post: 57 %
FEF2575-%Pred-Pre: 31 %
FEV1-%Change-Post: 24 %
FEV1-%Pred-Post: 75 %
FEV1-%Pred-Pre: 60 %
FEV1-Post: 1.73 L
FEV1-Pre: 1.38 L
FEV1FVC-%Change-Post: 12 %
FEV1FVC-%Pred-Pre: 79 %
FEV6-%Change-Post: 11 %
FEV6-%Pred-Post: 86 %
FEV6-%Pred-Pre: 77 %
FEV6-Post: 2.35 L
FEV6-Pre: 2.11 L
FEV6FVC-%Change-Post: 0 %
FEV6FVC-%Pred-Post: 102 %
FEV6FVC-%Pred-Pre: 102 %
FVC-%Change-Post: 11 %
FVC-%Pred-Post: 84 %
FVC-%Pred-Pre: 75 %
FVC-Post: 2.35 L
FVC-Pre: 2.11 L
Post FEV1/FVC ratio: 74 %
Post FEV6/FVC ratio: 100 %
Pre FEV1/FVC ratio: 66 %
Pre FEV6/FVC Ratio: 100 %
RV % pred: 119 %
RV: 1.87 L
TLC % pred: 85 %
TLC: 4.08 L

## 2020-03-14 LAB — CBC WITH DIFFERENTIAL/PLATELET
Basophils Absolute: 0 10*3/uL (ref 0.0–0.1)
Basophils Relative: 0.3 % (ref 0.0–3.0)
Eosinophils Absolute: 0.3 10*3/uL (ref 0.0–0.7)
Eosinophils Relative: 5.4 % — ABNORMAL HIGH (ref 0.0–5.0)
HCT: 33.1 % — ABNORMAL LOW (ref 36.0–46.0)
Hemoglobin: 10.9 g/dL — ABNORMAL LOW (ref 12.0–15.0)
Lymphocytes Relative: 40 % (ref 12.0–46.0)
Lymphs Abs: 2.5 10*3/uL (ref 0.7–4.0)
MCHC: 33.1 g/dL (ref 30.0–36.0)
MCV: 88.4 fl (ref 78.0–100.0)
Monocytes Absolute: 0.6 10*3/uL (ref 0.1–1.0)
Monocytes Relative: 10.2 % (ref 3.0–12.0)
Neutro Abs: 2.7 10*3/uL (ref 1.4–7.7)
Neutrophils Relative %: 44.1 % (ref 43.0–77.0)
Platelets: 365 10*3/uL (ref 150.0–400.0)
RBC: 3.74 Mil/uL — ABNORMAL LOW (ref 3.87–5.11)
RDW: 15.2 % (ref 11.5–15.5)
WBC: 6.1 10*3/uL (ref 4.0–10.5)

## 2020-03-14 MED ORDER — BREZTRI AEROSPHERE 160-9-4.8 MCG/ACT IN AERO: 2.0000 | INHALATION_SPRAY | Freq: Two times a day (BID) | RESPIRATORY_TRACT | 11 refills | Status: DC

## 2020-03-14 MED ORDER — BREZTRI AEROSPHERE 160-9-4.8 MCG/ACT IN AERO: 2.0000 | INHALATION_SPRAY | Freq: Two times a day (BID) | RESPIRATORY_TRACT | 0 refills | Status: DC
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
## 2020-03-14 NOTE — Progress Notes (Signed)
Full PFT performed today. °

## 2020-03-14 NOTE — Patient Instructions (Addendum)
Thank you for visiting Dr. Chestine Spore at [facility]. We recommend the following: Orders Placed This Encounter  Procedures  . IgE  . CBC w/Diff   Orders Placed This Encounter  Procedures  . IgE    Standing Status:   Future    Standing Expiration Date:   03/14/2021  . CBC w/Diff    Standing Status:   Future    Standing Expiration Date:   03/14/2021    Meds ordered this encounter  Medications  . Budeson-Glycopyrrol-Formoterol (BREZTRI AEROSPHERE) 160-9-4.8 MCG/ACT AERO    Sig: Inhale 2 puffs into the lungs 2 (two) times daily.    Dispense:  10.7 g    Refill:  11     Start Breztri inhaler 2 times per day, 2 puffs each time. Once you start this, you can stop Symbicort and Spiriva. Rinse your mouth after every use.   Albuterol up to 4 hours as needed.  Start Singulair once daily.   If you need more allergy medicine, start zyrtec once daily (over the counter).   Return in about 2 months (around 05/14/2020).    Please do your part to reduce the spread of COVID-19.

## 2020-03-15 LAB — IGE: IgE (Immunoglobulin E), Serum: 20 kU/L (ref <=114)

## 2020-03-15 NOTE — Progress Notes (Signed)
Please let Leslie Sanders know that her antibody level associated with allergies was normal but the WBC type we see with allergies was elevated. Thanks!

## 2020-03-16 LAB — PULMONARY FUNCTION TEST
DL/VA % pred: 103 %
DL/VA: 4.6 ml/min/mmHg/L
DLCO cor % pred: 82 %
DLCO cor: 16.53 ml/min/mmHg
DLCO unc % pred: 82 %
DLCO unc: 16.53 ml/min/mmHg
FEF 25-75 Post: 1.47 L/sec
FEF 25-75 Pre: 0.8 L/sec
FEF2575-%Change-Post: 83 %
FEF2575-%Pred-Post: 57 %
FEF2575-%Pred-Pre: 31 %
FEV1-%Change-Post: 24 %
FEV1-%Pred-Post: 75 %
FEV1-%Pred-Pre: 60 %
FEV1-Post: 1.73 L
FEV1-Pre: 1.38 L
FEV1FVC-%Change-Post: 12 %
FEV1FVC-%Pred-Pre: 79 %
FEV6-%Change-Post: 11 %
FEV6-%Pred-Post: 86 %
FEV6-%Pred-Pre: 77 %
FEV6-Post: 2.35 L
FEV6-Pre: 2.11 L
FEV6FVC-%Change-Post: 0 %
FEV6FVC-%Pred-Post: 102 %
FEV6FVC-%Pred-Pre: 102 %
FVC-%Change-Post: 11 %
FVC-%Pred-Post: 84 %
FVC-%Pred-Pre: 75 %
FVC-Post: 2.35 L
FVC-Pre: 2.11 L
Post FEV1/FVC ratio: 74 %
Post FEV6/FVC ratio: 100 %
Pre FEV1/FVC ratio: 66 %
Pre FEV6/FVC Ratio: 100 %
RV % pred: 119 %
RV: 1.87 L
TLC % pred: 85 %
TLC: 4.08 L

## 2020-03-27 ENCOUNTER — Ambulatory Visit: Payer: Medicare Other | Attending: Internal Medicine

## 2020-03-27 DIAGNOSIS — Z23 Encounter for immunization: Secondary | ICD-10-CM

## 2020-03-27 NOTE — Progress Notes (Signed)
   Covid-19 Vaccination Clinic  Name:  MARYLAN GLORE    MRN: [id number] DOB: Oct 24, 1976  03/27/2020  Ms. Willard was observed post Covid-19 immunization for 15 minutes without incident. She was provided with Vaccine Information Sheet and instruction to access the V-Safe system.   Ms. Adelstein was instructed to call 911 with any severe reactions post vaccine: Marland Kitchen Difficulty breathing  . Swelling of face and throat  . A fast heartbeat  . A bad rash all over body  . Dizziness and weakness   Immunizations Administered    Name Date Dose VIS Date Route   Pfizer COVID-19 Vaccine 03/27/2020  1:41 PM 0.3 mL 02/01/2019 Intramuscular   Manufacturer: [employer], [employer]   Lot: [id number]   NDC: [address]-4

## 2020-03-29 ENCOUNTER — Ambulatory Visit (INDEPENDENT_AMBULATORY_CARE_PROVIDER_SITE_OTHER): Payer: Medicare Other | Admitting: Cardiology

## 2020-03-29 ENCOUNTER — Other Ambulatory Visit: Payer: Self-pay

## 2020-03-29 ENCOUNTER — Encounter: Payer: Self-pay | Admitting: Cardiology

## 2020-03-29 VITALS — BP 100/68 | HR 78 | Ht 62.0 in | Wt 130.0 lb

## 2020-03-29 DIAGNOSIS — I491 Atrial premature depolarization: Secondary | ICD-10-CM

## 2020-03-29 DIAGNOSIS — R0602 Shortness of breath: Secondary | ICD-10-CM

## 2020-03-29 DIAGNOSIS — I471 Supraventricular tachycardia: Secondary | ICD-10-CM | POA: Diagnosis not present

## 2020-03-29 MED ORDER — PROPRANOLOL HCL 20 MG PO TABS: 20.0000 mg | ORAL_TABLET | Freq: Two times a day (BID) | ORAL | 1 refills | Status: DC

## 2020-03-29 MED ORDER — MIDODRINE HCL 5 MG PO TABS: 5.0000 mg | ORAL_TABLET | Freq: Three times a day (TID) | ORAL | 1 refills | Status: DC

## 2020-03-29 NOTE — Progress Notes (Signed)
Cardiology Office Note:    Date:  03/29/2020   ID:  [name], DOB [birth year], MRN [id number]  PCP:  Lynn Ito, MD  Cardiologist:  Gypsy Balsam, MD  Electrophysiologist:  None   Referring MD: Lynn Ito, MD   Chief Complaint  Patient presents with  . Follow-up    2 Months   History of Present Illness:    Leslie Sanders is a 44 y.o. female with a hx of vitamin B12 deficiency, former smoker, hypertension and diastolic dysfunction, anxiety and COPD presents for follow-up.  I did see the patient January 09, 2020 and during her evaluation she reported significant shortness of breath and worsening chest pain which are out of proportion therefore I proceeded directly to right and left heart catheterization.  She did undergo her catheterization which showed normal coronaries, with a low capillary wedge pressure as well as normal pressures in the right heart.  Today she reports that she shortness of breath has become significantly increased and she also is now expressing intermittent palpitations with dizziness.  She states that the chest pain has resolved.  She states that she get a bump intermittent palpitations ask sounds with the abrupt onset of fast heartbeat that lasts a few seconds prior to resolution.   The dizziness is separate which she tells me that her most bothersome symptom is more the dizziness than anything else.  I saw the patient on January 29, 2020 after left and right heart catheterization which was completely normal.  We discussed at that time to follow-up with her pulmonologist for optimizing her inhalers.    In addition she did tell me that she has been experiencing intermittent palpitation therefore 14-day ZIO monitor was placed on the patient.   The patient is here to discuss these results.  He tells me that the palpitations have becomes worse and she feels more dizzy whenever this occurs.  She has not passed out but she feels like she feels sometimes that  she is going to.  She does tell me she thinks at this time her blood pressure is low which in the past she is checked it her blood pressure has been close to the mid 90s systolics.  No other complaints at this time.  Of note today in the office she is very confrontational she thinks that we are not doing anything for her in terms of her cardiovascular health.  She had a list of things that her family members had sent her picture off.  Past Medical History:  Diagnosis Date  . Anemia    (a) chronic blood loss hx of menorrhagia s/p endometrial biopsy 04/2015 (b) iron deficient  . Anxiety   . B12 deficiency   . Bipolar 1 disorder (HCC)   . Cervical spinal stenosis   . Cervical spondylosis without myelopathy 07/08/2016  . Chronic pain of right thumb 04/28/2018  . Depression   . GERD (gastroesophageal reflux disease)   . Hemorrhoids    Pt with constipation secondary to iron supplement. Pt has had rectal bleed secondary to internal hemorrhoids. Pt is s/p EGD/colonoscopy 05/2012  . 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 Migraine   . PONV (postoperative nausea and vomiting)   . PTSD (post-traumatic stress disorder)   . Seasonal allergies   . Vitamin E deficiency   . Zinc deficiency     Past Surgical History:  Procedure Laterality Date  . ABDOMINAL HYSTERECTOMY    . BREAST LUMPECTOMY Bilateral   .  BREAST REDUCTION SURGERY    . Carpal Metacarpal Arthroplasty Right 05/13/2019  . CHOLECYSTECTOMY  1990  . HAND SURGERY    . HYSTERECTOMY ABDOMINAL WITH SALPINGECTOMY  11/2017  . POSTERIOR CERVICAL LAMINECTOMY Bilateral 05/23/2019  . RIGHT/LEFT HEART CATH AND CORONARY ANGIOGRAPHY N/A 01/13/2020   Procedure: RIGHT/LEFT HEART CATH AND CORONARY ANGIOGRAPHY;  Surgeon: Lyn Records, MD;  Location: [facility];  Service: Cardiovascular;  Laterality: N/A;  . ROUX-EN-Y GASTRIC BYPASS  2003   in [place]  . SPINAL FUSION  05/23/2019    Current Medications: Current Meds  Medication Sig  .  albuterol (PROAIR HFA) 108 (90 Base) MCG/ACT inhaler Inhale 1 puff into the lungs every 6 (six) hours as needed for wheezing or shortness of breath.  Marland Kitchen albuterol (PROVENTIL) (2.5 MG/3ML) 0.083% nebulizer solution Take 3 mLs (2.5 mg total) by nebulization every 6 (six) hours as needed for wheezing or shortness of breath (Inhale 26ml every 6 hours and as needed.).  Marland Kitchen amoxicillin (AMOXIL) 500 MG capsule Take 500 mg by mouth every 8 (eight) hours.  . Budeson-Glycopyrrol-Formoterol (BREZTRI AEROSPHERE) 160-9-4.8 MCG/ACT AERO Inhale 2 puffs into the lungs 2 (two) times daily.  . busPIRone (BUSPAR) 5 MG tablet Take 5 mg by mouth 2 (two) times daily.  . cyanocobalamin (,VITAMIN B-12,) 1000 MCG/ML injection Inject 1 mL into the muscle every 30 (thirty) days.  Marland Kitchen docusate sodium (COLACE) 100 MG capsule Take 100 mg by mouth daily.  . DULoxetine (CYMBALTA) 60 MG capsule Take 60 mg by mouth daily.   . ferrous sulfate 325 (65 FE) MG tablet Take 1 tablet (325 mg total) by mouth 2 (two) times daily with a meal.  . fluticasone (FLONASE) 50 MCG/ACT nasal spray Place 2 sprays into both nostrils daily.  . folic acid (FOLVITE) 1 MG tablet Take 1 mg by mouth daily.   . furosemide (LASIX) 40 MG tablet Take 40 mg by mouth daily.  Marland Kitchen glycopyrrolate (ROBINUL) 2 MG tablet Take 1 tablet (2 mg total) by mouth 2 (two) times daily.  . hydrOXYzine (ATARAX/VISTARIL) 25 MG tablet Take 1 tablet (25 mg total) by mouth every 6 (six) hours as needed for anxiety.  . montelukast (SINGULAIR) 10 MG tablet Take 10 mg by mouth daily.   Marland Kitchen omeprazole (PRILOSEC) 40 MG capsule Take 1 capsule (40 mg total) by mouth daily.  . pantoprazole (PROTONIX) 40 MG tablet Take 1 tablet (40 mg total) by mouth daily.  . pregabalin (LYRICA) 150 MG capsule Take 150 mg by mouth 2 (two) times daily.  . Vitamin D, Ergocalciferol, (DRISDOL) 1.25 MG (50000 UT) CAPS capsule Take 50,000 Units by mouth every Monday.      Allergies:   Tylenol [acetaminophen], Adhesive   [tape], Hydrocodone-acetaminophen, Nsaids, and Latex   Social History   Socioeconomic History  . Marital status: Divorced    Spouse name: Not on file  . Number of children: Not on file  . Years of education: Not on file  . Highest education level: Not on file  Occupational History  . Not on file  Tobacco Use  . Smoking status: Former Smoker    Packs/day: 2.00    Years: 23.00    Pack years: 46.00    Types: Cigarettes    Quit date: 12/08/2018    Years since quitting: 1.3  . Smokeless tobacco: Never Used  Substance and Sexual Activity  . Alcohol use: Not Currently    Alcohol/week: 28.0 standard drinks    Types: 28 Cans of beer  per week    Comment: Nothing in 4 years  . Drug use: No  . Sexual activity: Not Currently  Other Topics Concern  . Not on file  Social History Narrative  . Not on file   Social Determinants of Health   Financial Resource Strain:   . Difficulty of Paying Living Expenses:   Food Insecurity:   . Worried About Charity fundraiser in the Last Year:   . Arboriculturist in the Last Year:   Transportation Needs:   . Film/video editor (Medical):   Marland Kitchen Lack of Transportation (Non-Medical):   Physical Activity:   . Days of Exercise per Week:   . Minutes of Exercise per Session:   Stress:   . Feeling of Stress :   Social Connections:   . Frequency of Communication with Friends and Family:   . Frequency of Social Gatherings with Friends and Family:   . Attends Religious Services:   . Active Member of Clubs or Organizations:   . Attends Archivist Meetings:   Marland Kitchen Marital Status:      Family History: The patient's family history includes ADD / ADHD in her son; Arthritis/Rheumatoid in her mother; Breast cancer in her mother; COPD in her maternal grandmother and paternal grandmother; HIV in her father; Lupus in her mother; ODD in her son; Rheum arthritis in her maternal grandmother; Schizophrenia in her maternal aunt.  ROS:   Review of Systems    Constitution: Negative for decreased appetite, fever and weight gain.  HENT: Negative for congestion, ear discharge, hoarse voice and sore throat.   Eyes: Negative for discharge, redness, vision loss in right eye and visual halos.  Cardiovascular: Negative for chest pain, dyspnea on exertion, leg swelling, orthopnea and palpitations.  Respiratory: Negative for cough, hemoptysis, shortness of breath and snoring.   Endocrine: Negative for heat intolerance and polyphagia.  Hematologic/Lymphatic: Negative for bleeding problem. Does not bruise/bleed easily.  Skin: Negative for flushing, nail changes, rash and suspicious lesions.  Musculoskeletal: Negative for arthritis, joint pain, muscle cramps, myalgias, neck pain and stiffness.  Gastrointestinal: Negative for abdominal pain, bowel incontinence, diarrhea and excessive appetite.  Genitourinary: Negative for decreased libido, genital sores and incomplete emptying.  Neurological: Negative for brief paralysis, focal weakness, headaches and loss of balance.  Psychiatric/Behavioral: Negative for altered mental status, depression and suicidal ideas.  Allergic/Immunologic: Negative for HIV exposure and persistent infections.    EKGs/Labs/Other Studies Reviewed:    The following studies were reviewed today:   EKG:  None today  Zio monitor  The patient wore the monitor for 13 days 10 hours starting February 02, 2020. Indication: Palpitations The minimum heart rate was 48 bpm, maximum heart rate was 197 bpm, and average heart rate was 82 bpm. Predominant underlying rhythm was Sinus Rhythm.  4 Supraventricular Tachycardia runs occurred, the run with the fastest interval lasting 4 beats with a maximum rate of 197 bpm, the longest lasting 14 beats with an average rate of 135 bpm.   Premature atrial complexes were rare (<1.0%). Premature Ventricular complexes were rare (<1.0%).  No ventricular tachycardia, no pauses, No AV block and no atrial  fibrillation present. 15 patient triggered events noted 1 associated with premature atrial complex the remaining associated with sinus rhythm.  4 diary events all associated with sinus rhythm.  Conclusion: This study is remarkable for the following:               1.  Asymptomatic paroxysmal supraventricular  tachycardia which is likely atrial tachycardia with variable block.                 2.  Rare symptomatic premature atrial complexes.  Right and left heart catheterization  Normal pulmonary artery pressures  Low pulmonary capillary wedge pressure, 6 mmHg.  Right dominant coronary artery anatomy.  Normal coronary arteries without evidence of obstruction or luminal irregularity  Normal left ventricular filling pressures. Normal left ventricular systolic function.  Normal cardiac output.   Oximetry did not reveal any evidence of left to right shunting  RECOMMENDATIONS:   Hyperpnea with resultant hyperventilation syndrome. Findings after reviewing all studies suggest psychiatric or central nervous system etiology.  Discontinuation of diuretic therapy would seem appropriate given relatively low filling pressures.  Recent Labs: 12/20/2019: NT-Pro BNP 60 01/09/2020: BUN 10; Creatinine, Ser 0.67 01/13/2020: Potassium 3.1; Sodium 143 03/14/2020: Hemoglobin 10.9; Platelets 365.0  Recent Lipid Panel    Component Value Date/Time   CHOL 173 11/29/2016 0644   TRIG 84 11/29/2016 0644   HDL 88 11/29/2016 0644   CHOLHDL 2.0 11/29/2016 0644   VLDL 17 11/29/2016 0644   LDLCALC 68 11/29/2016 0644    Physical Exam:    VS:  BP 100/68   Pulse 78   Ht 5\' 2"  (1.575 m)   Wt 130 lb (59 kg)   LMP 10/07/2016   SpO2 99%   BMI 23.78 kg/m     Wt Readings from Last 3 Encounters:  03/29/20 130 lb (59 kg)  03/14/20 132 lb (59.9 kg)  02/02/20 130 lb 6.4 oz (59.1 kg)     GEN: Well nourished, well developed in no acute distress HEENT: Normal NECK: No JVD; No carotid bruits LYMPHATICS: No  lymphadenopathy CARDIAC: S1S2 noted,RRR, no murmurs, rubs, gallops RESPIRATORY:  Clear to auscultation without rales, wheezing or rhonchi  ABDOMEN: Soft, non-tender, non-distended, +bowel sounds, no guarding. EXTREMITIES: No edema, No cyanosis, no clubbing MUSCULOSKELETAL:  No deformity  SKIN: Warm and dry NEUROLOGIC:  Alert and oriented x 3, non-focal PSYCHIATRIC:  Normal affect, good insight  ASSESSMENT:    1. Shortness of breath   2. PAC (premature atrial contraction)   3. PAT (paroxysmal atrial tachycardia) (HCC)    PLAN:    She reports that she is still experiencing shortness of breath and she is very upset today as she thinks that the medical community is not doing anything for her.  I explained to the patient that continue to do cardiac work-up which has included an echocardiogram, right and left heart catheterization, as well as get a monitor on her.    We have discussed her ZIO monitor which show symptomatic PACs as well as some occasional paroxysmal atrial tachycardia.  Given her worsening symptoms of palpitations I like to start the patient on propanolol 20 mg twice daily however in light of her low blood pressure I am going to add midodrine 5 mg every 8 hours.  I am hoping we can get some improvement here.  I have considered cardiac MR but the patient has had metal implant in her neck post an accident and neck surgery.  She also follows with her pulmonologist as she does have a history of smoking she has been treated for COPD.  In my assessment I do think that her psychiatric state is also playing a role here.  I am suspecting malingering at this time.  She almost seems very upset that we have not found any issues with the testing that was done and  had a list of medical problems that she thinks she could have.  She personally stated that she feels that we are not doing anything even after explained to her that we have been doing a lot and explained all the test we have  done.  The patient is in agreement with the above plan. The patient left the office in stable condition.  The patient will follow up in 1 month or sooner if needed.   Medication Adjustments/Labs and Tests Ordered: Current medicines are reviewed at length with the patient today.  Concerns regarding medicines are outlined above.  No orders of the defined types were placed in this encounter.  Meds ordered this encounter  Medications  . DISCONTD: midodrine (PROAMATINE) 5 MG tablet    Sig: Take 1 tablet (5 mg total) by mouth every 8 (eight) hours.    Dispense:  120 tablet    Refill:  1  . propranolol (INDERAL) 20 MG tablet    Sig: Take 1 tablet (20 mg total) by mouth 2 (two) times daily.    Dispense:  60 tablet    Refill:  1  . midodrine (PROAMATINE) 5 MG tablet    Sig: Take 1 tablet (5 mg total) by mouth 3 (three) times daily with meals.    Dispense:  90 tablet    Refill:  1    Patient Instructions  Medication Instructions:  Your physician has recommended you make the following change in your medication:  1.  START Midodrine 5 mg taking 1 tablet every 8 hours = 3 times a day 2.  START Propanolol 20 mg taking 1 tablet twice a day   *If you need a refill on your cardiac medications before your next appointment, please call your pharmacy*   Lab Work: None ordered  If you have labs (blood work) drawn today and your tests are completely normal, you will receive your results only by: Marland Kitchen. MyChart Message (if you have MyChart) OR . A paper copy in the mail If you have any lab test that is abnormal or we need to change your treatment, we will call you to review the results.   Testing/Procedures: None ordered   Follow-Up: At [facility], you and your health needs are our priority.  As part of our continuing mission to provide you with exceptional heart care, we have created designated Provider Care Teams.  These Care Teams include your primary Cardiologist (physician) and Advanced  Practice Providers (APPs -  Physician Assistants and Nurse Practitioners) who all work together to provide you with the care you need, when you need it.  We recommend signing up for the patient portal called "MyChart".  Sign up information is provided on this After Visit Summary.  MyChart is used to connect with patients for Virtual Visits (Telemedicine).  Patients are able to view lab/test results, encounter notes, upcoming appointments, etc.  Non-urgent messages can be sent to your provider as well.   To learn more about what you can do with MyChart, go to [email or web address].    Your next appointment:   1 month(s)  The format for your next appointment:   In Person  Provider:   Thomasene RippleKardie Ernestine Langworthy, DO   Other Instructions      Adopting a Healthy Lifestyle.  Know what a healthy weight is for you (roughly BMI <25) and aim to maintain this   Aim for 7+ servings of fruits and vegetables daily   65-80+ fluid ounces of water or unsweet tea for healthy  kidneys   Limit to max 1 drink of alcohol per day; avoid smoking/tobacco   Limit animal fats in diet for cholesterol and heart health - choose grass fed whenever available   Avoid highly processed foods, and foods high in saturated/trans fats   Aim for low stress - take time to unwind and care for your mental health   Aim for 150 min of moderate intensity exercise weekly for heart health, and weights twice weekly for bone health   Aim for 7-9 hours of sleep daily   When it comes to diets, agreement about the perfect plan isnt easy to find, even among the experts. Experts at the [facility] [employer] developed an idea known as the Healthy Eating Plate. Just imagine a plate divided into logical, healthy portions.   The emphasis is on diet quality:   Load up on vegetables and fruits - one-half of your plate: Aim for color and variety, and remember that potatoes dont count.   Go for whole grains - one-quarter of your  plate: Whole wheat, barley, wheat berries, quinoa, oats, brown rice, and foods made with them. If you want pasta, go with whole wheat pasta.   Protein power - one-quarter of your plate: Fish, chicken, beans, and nuts are all healthy, versatile protein sources. Limit red meat.   The diet, however, does go beyond the plate, offering a few other suggestions.   Use healthy plant oils, such as olive, canola, soy, corn, sunflower and peanut. Check the labels, and avoid partially hydrogenated oil, which have unhealthy trans fats.   If youre thirsty, drink water. Coffee and tea are good in moderation, but skip sugary drinks and limit milk and dairy products to one or two daily servings.   The type of carbohydrate in the diet is more important than the amount. Some sources of carbohydrates, such as vegetables, fruits, whole grains, and beans-are healthier than others.   Finally, stay active  Signed, Thomasene Ripple, DO  03/29/2020 12:12 PM    [facility]

## 2020-03-29 NOTE — Patient Instructions (Addendum)
Medication Instructions:  Your physician has recommended you make the following change in your medication:  1.  START Midodrine 5 mg taking 1 tablet every 8 hours = 3 times a day 2.  START Propanolol 20 mg taking 1 tablet twice a day   *If you need a refill on your cardiac medications before your next appointment, please call your pharmacy*   Lab Work: None ordered  If you have labs (blood work) drawn today and your tests are completely normal, you will receive your results only by: Marland Kitchen MyChart Message (if you have MyChart) OR . A paper copy in the mail If you have any lab test that is abnormal or we need to change your treatment, we will call you to review the results.   Testing/Procedures: None ordered   Follow-Up: At [facility], you and your health needs are our priority.  As part of our continuing mission to provide you with exceptional heart care, we have created designated Provider Care Teams.  These Care Teams include your primary Cardiologist (physician) and Advanced Practice Providers (APPs -  Physician Assistants and Nurse Practitioners) who all work together to provide you with the care you need, when you need it.  We recommend signing up for the patient portal called "MyChart".  Sign up information is provided on this After Visit Summary.  MyChart is used to connect with patients for Virtual Visits (Telemedicine).  Patients are able to view lab/test results, encounter notes, upcoming appointments, etc.  Non-urgent messages can be sent to your provider as well.   To learn more about what you can do with MyChart, go to [email or web address].    Your next appointment:   1 month(s)  The format for your next appointment:   In Person  Provider:   Thomasene Ripple, DO   Other Instructions

## 2020-05-01 ENCOUNTER — Telehealth: Payer: Self-pay | Admitting: Cardiology

## 2020-05-01 NOTE — Telephone Encounter (Signed)
New message   Patient states that she would like to switch from seeing Dr. Thomasene Ripple and now see Dr. Verdis Prime.

## 2020-05-02 ENCOUNTER — Ambulatory Visit: Payer: Medicare Other | Admitting: Cardiology

## 2020-05-02 NOTE — Telephone Encounter (Signed)
That will be fine with me. 

## 2020-05-07 NOTE — Telephone Encounter (Signed)
Ok

## 2020-05-15 ENCOUNTER — Encounter: Payer: Self-pay | Admitting: Critical Care Medicine

## 2020-05-15 ENCOUNTER — Ambulatory Visit (INDEPENDENT_AMBULATORY_CARE_PROVIDER_SITE_OTHER): Payer: Medicare Other | Admitting: Critical Care Medicine

## 2020-05-15 ENCOUNTER — Other Ambulatory Visit: Payer: Self-pay

## 2020-05-15 VITALS — BP 118/66 | HR 78 | Temp 97.9°F | Ht 62.0 in | Wt 134.6 lb

## 2020-05-15 DIAGNOSIS — R0609 Other forms of dyspnea: Secondary | ICD-10-CM

## 2020-05-15 DIAGNOSIS — J4550 Severe persistent asthma, uncomplicated: Secondary | ICD-10-CM

## 2020-05-15 DIAGNOSIS — R0600 Dyspnea, unspecified: Secondary | ICD-10-CM | POA: Diagnosis not present

## 2020-05-15 DIAGNOSIS — J309 Allergic rhinitis, unspecified: Secondary | ICD-10-CM | POA: Diagnosis not present

## 2020-05-15 NOTE — Progress Notes (Signed)
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  pregabalin (LYRICA) 150 MG capsule, Take 150 mg by mouth 2 (two) times daily., Disp: , Rfl:  .  propranolol (INDERAL) 20 MG tablet, Take 1 tablet (20 mg total) by mouth 2 (two) times daily., Disp: 60 tablet, Rfl: 1 .  tolterodine (DETROL LA) 4 MG 24 hr capsule, Take 1 capsule by mouth daily., Disp: , Rfl:  .  Vitamin D, Ergocalciferol, (DRISDOL) 1.25 MG (50000 UT) CAPS capsule, Take 50,000 Units by mouth every Monday. , Disp: , Rfl:    [name], DO [facility] 05/15/2020 2:06 PM

## 2020-05-15 NOTE — Patient Instructions (Addendum)
Thank you for visiting Dr. Chestine Spore at [facility]. We recommend the following: Orders Placed This Encounter  Procedures  . Pulmonary function test   Orders Placed This Encounter  Procedures  . Pulmonary function test    Standing Status:   Future    Standing Expiration Date:   05/15/2021    Order Specific Question:   Where should this test be performed?    Answer:   [facility]    Keep taking Breztri two times daily. Keep taking loratidine- D once daily. Keep taking montelukast once daily. Keep taking flonase daily.    Return in about 4 weeks (around 06/12/2020).  After PFTs.   Please do your part to reduce the spread of COVID-19.

## 2020-05-23 NOTE — Progress Notes (Signed)
CARDIOLOGY OFFICE NOTE  Date:  05/30/2020    Leslie Sanders Date of Birth: 09-Jul-1976 Medical Record #[id number]  PCP:  Lynn Ito, MD  Cardiologist:  Katrinka Blazing (Former [facility])  Chief Complaint  Patient presents with  . Follow-up    History of Present Illness: Leslie Sanders is a 44 y.o. female who presents today for a follow up visit. Seen for Dr. Katrinka Blazing - she is switching from Dr. Servando Salina.   She has a history of vitamin B12 deficiency, former smoker, hypertension, diastolic dysfunction, anxiety/bipolar disorder and COPD. Prior gastric bypass surgery.   Seen back in February with chest pain and SOB - ended up being referred for cath showing normal coronaries, low wedge and normal right heart pressures. She was to see pulmonary in follow up. Then had palpitations - had a monitor placed - noted that she was very confrontational at their last meeting - saying that not enough was being done for her cardiovascular health.   The patient does not have symptoms concerning for COVID-19 infection (fever, chills, cough, or new shortness of breath).   Comes in today. Here alone. Her history is hard for me to follow. She has seen Dr. Katrinka Blazing (he did her cath) and Dr. Dulce Sellar in the past. She notes more heart skipping. She is worried about being short of breath with talking. She is only taking Inderal just once a day - otherwise she feels too tired with taking more of this. She notes she gets dizzy a lot - she falls a lot. Does not sound like this is a new finding. She is wanting to know what is wrong with her heart and why she feels this way. She admits she has high levels of anxiety and this probably plays a role. Apparently her medicines for this have been recently increased. She does not understand why she can't do much in the way of activity. She tires easily. She gets short of breath. She is not sleeping well. She is using a walker and getting some PT. She notes her BP is up and down. She is taking Lasix  for lower extremity edema. She does not understand why she can't do as much as she could several years ago. Chronic pain issues. She is asking about further cardiac testing.   Past Medical History:  Diagnosis Date  . Anemia    (a) chronic blood loss hx of menorrhagia s/p endometrial biopsy 04/2015 (b) iron deficient  . Anxiety   . B12 deficiency   . Bipolar 1 disorder (HCC)   . Cervical spinal stenosis   . Cervical spondylosis without myelopathy 07/08/2016  . Chronic pain of right thumb 04/28/2018  . Depression   . GERD (gastroesophageal reflux disease)   . Hemorrhoids    Pt with constipation secondary to iron supplement. Pt has had rectal bleed secondary to internal hemorrhoids. Pt is s/p EGD/colonoscopy 05/2012  . 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 Migraine   . PONV (postoperative nausea and vomiting)   . PTSD (post-traumatic stress disorder)   . Seasonal allergies   . Vitamin E deficiency   . Zinc deficiency     Past Surgical History:  Procedure Laterality Date  . ABDOMINAL HYSTERECTOMY    . BREAST LUMPECTOMY Bilateral   . BREAST REDUCTION SURGERY    . Carpal Metacarpal Arthroplasty Right 05/13/2019  . CHOLECYSTECTOMY  1990  . HAND SURGERY    . HYSTERECTOMY ABDOMINAL WITH SALPINGECTOMY  11/2017  . POSTERIOR  CERVICAL LAMINECTOMY Bilateral 05/23/2019  . RIGHT/LEFT HEART CATH AND CORONARY ANGIOGRAPHY N/A 01/13/2020   Procedure: RIGHT/LEFT HEART CATH AND CORONARY ANGIOGRAPHY;  Surgeon: Lyn Records, MD;  Location: [facility];  Service: Cardiovascular;  Laterality: N/A;  . ROUX-EN-Y GASTRIC BYPASS  2003   in [place]  . SPINAL FUSION  05/23/2019     Medications: Current Meds  Medication Sig  . albuterol (PROAIR HFA) 108 (90 Base) MCG/ACT inhaler Inhale 1 puff into the lungs every 6 (six) hours as needed for wheezing or shortness of breath.  Marland Kitchen albuterol (PROVENTIL) (2.5 MG/3ML) 0.083% nebulizer solution Take 3 mLs (2.5 mg total) by nebulization every 6 (six)  hours as needed for wheezing or shortness of breath (Inhale 97ml every 6 hours and as needed.).  Marland Kitchen Budeson-Glycopyrrol-Formoterol (BREZTRI AEROSPHERE) 160-9-4.8 MCG/ACT AERO Inhale 2 puffs into the lungs 2 (two) times daily.  . busPIRone (BUSPAR) 5 MG tablet Take 5 mg by mouth 2 (two) times daily.  . chlorhexidine (PERIDEX) 0.12 % solution SMARTSIG:By Mouth  . cyanocobalamin (,VITAMIN B-12,) 1000 MCG/ML injection Inject 1 mL into the muscle every 30 (thirty) days.  Marland Kitchen docusate sodium (COLACE) 100 MG capsule Take 100 mg by mouth daily.  . DULoxetine (CYMBALTA) 60 MG capsule Take 60 mg by mouth daily.   . ferrous sulfate 325 (65 FE) MG tablet Take 1 tablet (325 mg total) by mouth 2 (two) times daily with a meal.  . fluticasone (FLONASE) 50 MCG/ACT nasal spray Place 2 sprays into both nostrils daily.  . folic acid (FOLVITE) 1 MG tablet Take 1 mg by mouth daily.   . furosemide (LASIX) 40 MG tablet Take 40 mg by mouth daily.  Marland Kitchen glycopyrrolate (ROBINUL) 2 MG tablet Take 1 tablet (2 mg total) by mouth 2 (two) times daily.  . hydrOXYzine (ATARAX/VISTARIL) 25 MG tablet Take 1 tablet (25 mg total) by mouth every 6 (six) hours as needed for anxiety.  . midodrine (PROAMATINE) 5 MG tablet Take 1 tablet (5 mg total) by mouth 3 (three) times daily with meals.  . montelukast (SINGULAIR) 10 MG tablet Take 10 mg by mouth daily.   Marland Kitchen omeprazole (PRILOSEC) 40 MG capsule Take 1 capsule (40 mg total) by mouth daily.  . pantoprazole (PROTONIX) 40 MG tablet Take 1 tablet (40 mg total) by mouth daily.  . pregabalin (LYRICA) 150 MG capsule Take 150 mg by mouth 2 (two) times daily.  . propranolol (INDERAL) 20 MG tablet Take 1 tablet (20 mg total) by mouth 2 (two) times daily.  Marland Kitchen tolterodine (DETROL LA) 4 MG 24 hr capsule Take 1 capsule by mouth daily.  . Vitamin D, Ergocalciferol, (DRISDOL) 1.25 MG (50000 UT) CAPS capsule Take 50,000 Units by mouth every Monday.      Allergies: Allergies  Allergen Reactions  . Tylenol  [Acetaminophen] Anaphylaxis    Per patient  . Adhesive  [Tape] Rash  . Hydrocodone-Acetaminophen Other (See Comments) and Nausea And Vomiting  . Nsaids Other (See Comments)    S/p bariatric surgery; increased risk of ulcers, gastritis and cancer  . Latex Rash    Social History: The patient  reports that she quit smoking about 17 months ago. Her smoking use included cigarettes. She has a 46.00 pack-year smoking history. She has never used smokeless tobacco. She reports previous alcohol use of about 28.0 standard drinks of alcohol per week. She reports that she does not use drugs.   Family History: The patient's family history includes ADD / ADHD in her  son; Arthritis/Rheumatoid in her mother; Breast cancer in her mother; COPD in her maternal grandmother and paternal grandmother; HIV in her father; Lupus in her mother; ODD in her son; Rheum arthritis in her maternal grandmother; Schizophrenia in her maternal aunt.   Review of Systems: Please see the history of present illness.   All other systems are reviewed and negative.   Physical Exam: VS:  BP 130/76   Pulse 75   Ht 5\' 2"  (1.575 m)   Wt 133 lb 12.8 oz (60.7 kg)   LMP 10/07/2016   SpO2 99%   BMI 24.47 kg/m  .  BMI Body mass index is 24.47 kg/m.  Wt Readings from Last 3 Encounters:  05/30/20 133 lb 12.8 oz (60.7 kg)  05/15/20 134 lb 9.6 oz (61.1 kg)  03/29/20 130 lb (59 kg)    General: Anxious. She is in no acute distress.  Cardiac: Regular rate and rhythm. No murmurs, rubs, or gallops. No edema.  Respiratory:  Lungs are clear to auscultation bilaterally with normal work of breathing.  GI: Soft and nontender.  MS: No deformity or atrophy. Gait appears intact - she does have a walker with her.  Skin: Warm and dry. Color is normal.  Neuro:  Strength and sensation are intact and no gross focal deficits noted.  Psych: Alert, appropriate and with normal affect.   LABORATORY DATA:  EKG:  EKG is ordered today.  Personally  reviewed by me. This demonstrates NSR.  Lab Results  Component Value Date   WBC 6.1 03/14/2020   HGB 10.9 (L) 03/14/2020   HCT 33.1 (L) 03/14/2020   PLT 365.0 03/14/2020   GLUCOSE 86 01/09/2020   CHOL 173 11/29/2016   TRIG 84 11/29/2016   HDL 88 11/29/2016   LDLCALC 68 11/29/2016   ALT 26 12/07/2016   AST 39 12/07/2016   NA 143 01/13/2020   K 3.1 (L) 01/13/2020   CL 101 01/09/2020   CREATININE 0.67 01/09/2020   BUN 10 01/09/2020   CO2 22 01/09/2020   TSH 1.388 10/14/2016   HGBA1C 4.7 (L) 01/09/2020     BNP (last 3 results) No results for input(s): BNP in the last 8760 hours.  ProBNP (last 3 results) Recent Labs    12/20/19 1555  PROBNP 60     Other Studies Reviewed Today:  Zio monitor  The patient wore the monitor for 13 days 10 hours starting February 02, 2020. Indication: Palpitations The minimum heart rate was 48 bpm, maximum heart rate was 197 bpm, and average heart rate was 82 bpm. Predominant underlying rhythm was Sinus Rhythm.  4 Supraventricular Tachycardia runs occurred, the run with the fastest interval lasting 4 beats with a maximum rate of 197 bpm, the longest lasting 14 beats with an average rate of 135 bpm.   Premature atrial complexes were rare (<1.0%). Premature Ventricular complexes were rare (<1.0%).  No ventricular tachycardia, no pauses, No AV block and no atrial fibrillation present. 15 patient triggered events noted 1 associated with premature atrial complex the remaining associated with sinus rhythm. 4 diary events all associated with sinus rhythm.  Conclusion: This study is remarkable for the following: 1. Asymptomatic paroxysmal supraventricular tachycardia which is likely atrial tachycardia with variable block.  2. Rare symptomatic premature atrial complexes.  Right and left heart catheterization 01/2020  Normal pulmonary artery pressures  Low pulmonary capillary wedge pressure, 6  mmHg.  Right dominant coronary artery anatomy.  Normal coronary arteries without evidence of obstruction or luminal irregularity  Normal left ventricular filling pressures. Normal left ventricular systolic function.  Normal cardiac output.   Oximetry did not reveal any evidence of left to right shunting  RECOMMENDATIONS:   Hyperpnea with resultant hyperventilation syndrome. Findings after reviewing all studies suggest psychiatric or central nervous system etiology.  Discontinuation of diuretic therapy would seem appropriate given relatively low filling pressures.   ECHO IMPRESSIONS 12/2019  1. Left ventricular ejection fraction, by visual estimation, is 60 to  65%. The left ventricle has normal function. There is no left ventricular  hypertrophy.  2. Left ventricular diastolic parameters are consistent with Grade I  diastolic dysfunction (impaired relaxation).  3. The left ventricle has no regional wall motion abnormalities.  4. Normal pulmonary artery systolic pressure.  5. The inferior vena cava is dilated in size with <50% respiratory  variability, suggesting right atrial pressure of 15 mmHg.   Current medicines are reviewed with the patient today.  The patient does not have concerns regarding medicines other than what has been noted above.   ASSESSMENT & PLAN:    1. Palpitations - basically reassurring monitor recently - she is using some Inderal - sounds like just once a day. She is using Midodrine to help keep her BP up. I told her I thought she should keep working with PT and focus on good general health measures. I do not think that further cardiac testing is warranted at this time given the work up that was done earlier this year.   2. Chronic SOB - I think this is multifactorial. Her cardiac status is felt to be stable. She is a former smoker and has COPD. Would continue her follow up with pulmonary.   3. Chest pain - this is not felt to be cardiac - she  has had normal cath. EF is normal. See #1.   4. Anxiety/Bipolar disorder - this seems to be the most driving issue in my opinion.   5. Chronic pain syndrome  6. Prior gastric bypass   The following changes have been made:  See above.  Labs/ tests ordered today include:    Orders Placed This Encounter  Procedures  . EKG 12-Lead     Disposition:   FU with [place] as needed.     Patient is agreeable to this plan and will call if any problems develop in the interim.   SignedTruitt Merle, NP  05/30/2020 4:43 PM  [facility] [address] [place], [place]  [address] Phone: [phone] Fax: [phone]

## 2020-05-28 ENCOUNTER — Encounter: Payer: Self-pay | Admitting: Pulmonary Disease

## 2020-05-28 ENCOUNTER — Ambulatory Visit (INDEPENDENT_AMBULATORY_CARE_PROVIDER_SITE_OTHER): Payer: Medicare Other | Admitting: Critical Care Medicine

## 2020-05-28 ENCOUNTER — Telehealth: Payer: Self-pay | Admitting: Critical Care Medicine

## 2020-05-28 ENCOUNTER — Other Ambulatory Visit: Payer: Self-pay

## 2020-05-28 ENCOUNTER — Ambulatory Visit (INDEPENDENT_AMBULATORY_CARE_PROVIDER_SITE_OTHER): Payer: Medicare Other | Admitting: Pulmonary Disease

## 2020-05-28 DIAGNOSIS — R0600 Dyspnea, unspecified: Secondary | ICD-10-CM | POA: Diagnosis not present

## 2020-05-28 DIAGNOSIS — R0609 Other forms of dyspnea: Secondary | ICD-10-CM

## 2020-05-28 DIAGNOSIS — J309 Allergic rhinitis, unspecified: Secondary | ICD-10-CM | POA: Insufficient documentation

## 2020-05-28 DIAGNOSIS — J4550 Severe persistent asthma, uncomplicated: Secondary | ICD-10-CM | POA: Insufficient documentation

## 2020-05-28 LAB — PULMONARY FUNCTION TEST
DL/VA % pred: 106 %
DL/VA: 4.74 ml/min/mmHg/L
DLCO cor % pred: 81 %
DLCO cor: 16.47 ml/min/mmHg
DLCO unc % pred: 81 %
DLCO unc: 16.47 ml/min/mmHg
FEF 25-75 Post: 1.27 L/sec
FEF 25-75 Pre: 0.79 L/sec
FEF2575-%Change-Post: 61 %
FEF2575-%Pred-Post: 50 %
FEF2575-%Pred-Pre: 30 %
FEV1-%Change-Post: 18 %
FEV1-%Pred-Post: 74 %
FEV1-%Pred-Pre: 62 %
FEV1-Post: 1.69 L
FEV1-Pre: 1.43 L
FEV1FVC-%Change-Post: 11 %
FEV1FVC-%Pred-Pre: 76 %
FEV6-%Change-Post: 6 %
FEV6-%Pred-Post: 88 %
FEV6-%Pred-Pre: 82 %
FEV6-Post: 2.41 L
FEV6-Pre: 2.25 L
FEV6FVC-%Change-Post: 0 %
FEV6FVC-%Pred-Post: 102 %
FEV6FVC-%Pred-Pre: 101 %
FVC-%Change-Post: 6 %
FVC-%Pred-Post: 86 %
FVC-%Pred-Pre: 81 %
FVC-Post: 2.41 L
FVC-Pre: 2.27 L
Post FEV1/FVC ratio: 70 %
Post FEV6/FVC ratio: 100 %
Pre FEV1/FVC ratio: 63 %
Pre FEV6/FVC Ratio: 99 %
RV % pred: 113 %
RV: 1.77 L
TLC % pred: 82 %
TLC: 3.94 L

## 2020-05-28 NOTE — Progress Notes (Signed)
PFT done today. 

## 2020-05-28 NOTE — Telephone Encounter (Signed)
Agreed.  Happy to help however I can.Leslie Sanders

## 2020-05-28 NOTE — Progress Notes (Signed)
Virtual Visit via Telephone Note  I connected with Leslie Sanders on 05/28/20 at  2:00 PM EDT by telephone and verified that I am speaking with the correct person using two identifiers.  Location: Patient: Home Provider: [facility] - [address], [place], [place], [place] [address]   I discussed the limitations, risks, security and privacy concerns of performing an evaluation and management service by telephone and the availability of in person appointments. I also discussed with the patient that there may be a patient responsible charge related to this service. The patient expressed understanding and agreed to proceed.  Patient consented to consult via telephone: Yes People present and their role in pt care: Pt     History of Present Illness:  44 year old female former smoker followed in our office for dyspnea on exertion and suspected severe persistent asthma  Past medical history: Anemia, bipolar, depression, atypical chest pain, status post gastric bypass, anxiety, fibromyalgia, sickle cell trait Smoking history: Former smoker.  Quit January/2020.  46-pack-year smoking history Maintenance: Breztri  Patient Dr. Carlis Abbott  Chief complaint:    44 year old female former smoker followed in our office for severe persistent asthma she is followed by Dr. Carlis Abbott.  Last seen on 05/16/2019 plan of care at that office visit was: Obtain pulmonary function testing, continue taking loratadine, montelukast, Flonase and Breztri.  05/28/2020-pulmonary function test-FVC 2.27 (81% predicted), postbronchodilator ratio 70, postbronchodilator FEV1 1.69 (74% predicted), bronchodilator response in FEV1, mid flow reversibility, DLCO 16.47 (81% predicted)  Patient reports adherence to her medications.  She still continues shortness of breath.  She reports she is doing physical therapy and she can only ride a bike for 3 minutes at a time.  She reports that she did complete an allergy evaluation many  years ago.  She feels that she is allergic to dust, mold, pollen as well as latex.  She is unsure who her allergist was.  She also reports that she is currently going to an integrative medicine doctor right now where she is doing acupuncture.  She denies using any sort of supplements.  Normal IgE of 20.  CBC with differential did show eosinophil count of 300.  Observations/Objective:  Chest Imaging-  01/13/2015 chest x-ray, 2 view- normal  CTA chest 09/30/2019-normal lung parenchyma, no PE.  No mediastinal or hilar adenopathy.  Faint reflux of contrast into IVC and liver.  Lower extremity venous ultrasound 09/30/2019 -negative for DVT bilaterally  Sniff test 11/09/2019- normal diaphragm movement, no evidence of paralysis  CXR, 1 view 12/22/2019-normal  Pulmonary Functions Testing Results:  05/28/2020-pulmonary function test-FVC 2.27 (81% predicted), postbronchodilator ratio 70, postbronchodilator FEV1 1.69 (74% predicted), bronchodilator response in FEV1, mid flow reversibility, DLCO 16.47 (81% predicted)  2021-moderate obstruction with significant bronchodilator reversibility.  2020- Moderate obstruction with significant bronchodilator reversibility and air trapping.  Mild diffusion impairment.  ABG on 11/07/2019: 7.51/ 28.7/ 113/ 22.6 A=(0.21 x [735-43])- (28.7/0.8)= 109.025 A-a gradient = 109.025 - 113= -4   Lexiscan 08/05/19  Nuclear stress EF: 63%.  There was no ST segment deviation noted during stress.  The study is normal.  This is a low risk study.  The left ventricular ejection fraction is normal (55-65%). Normal stress nuclear study with no ischemia or infarction. Gated ejection fraction 63% with normal wall motion.  Echo 06/17/19 at [facility]: 1. Technically fair study 2. No gross structural valvular abnormalities 3. No chamber enlargement 4. LV systolic function normal with EF 55% 6. No evidence intracardiac  mass or thrombus 7. No pericardial  effusion (Per cardiology consult; full report unavailable for review)   Echo 12/22/19-LVEF 60 to 84%, grade 1 diastolic dysfunction. LA, RV, RA. Trivial TR, otherwise normal valves. Dilated IVC with reduced respiratory variability.  Right and left heart catheterization 01/13/2020:   Normal pulmonary artery pressures  Low pulmonary capillary wedge pressure, 6 mmHg.  Right dominant coronary artery anatomy.  Normal coronary arteries without evidence of obstruction or luminal irregularity  Normal left ventricular filling pressures.  Normal left ventricular systolic function.  Normal cardiac output.    Oximetry did not reveal any evidence of left to right shunting  RECOMMENDATIONS:   Hyperpnea with resultant hyperventilation syndrome.  Findings after reviewing all studies suggest psychiatric or central nervous system etiology.  Discontinuation of diuretic therapy would seem appropriate given relatively low filling pressures.   Social History   Tobacco Use  Smoking Status Former Smoker  . Packs/day: 2.00  . Years: 23.00  . Pack years: 46.00  . Types: Cigarettes  . Quit date: 12/08/2018  . Years since quitting: 1.4  Smokeless Tobacco Never Used   Immunization History  Administered Date(s) Administered  . Influenza,inj,Quad PF,6+ Mos 12/21/2017, 08/31/2019  . Influenza-Unspecified 08/23/2015, 12/21/2017, 10/08/2018, 08/31/2019  . MMR 07/24/2015  . PFIZER SARS-COV-2 Vaccination 03/02/2020, 03/27/2020  . PPD Test 03/14/2016  . Pneumococcal Conjugate-13 10/31/2019  . Pneumococcal Polysaccharide-23 12/21/2017  . Tdap 07/24/2015      Assessment and Plan:  Allergic rhinitis Plan: Referral to allergy asthma Continue loratadine Continue Flonase Continue Singulair  Dyspnea on exertion Suspected physical deconditioning and asthma  Plan: Continue to work with physical therapy Referral to allergy asthma for formal allergy evaluation Could consider Biologics  Severe  persistent allergic asthma Reversibility on pulmonary function testing April/2021 shows normal IgE, eosinophil count of 300 Patient reports previous work-up with allergy asthma but she is unsure where she went.  Patient feels that she is allergic to dust, mold, pollen, latex Patient reports acute worsened symptoms when outside  Plan: Referral to allergy Could consider biologic medications Continue Singulair Continue loratadine Continue Flonase Continue Breztri  Can start nasal saline rinses   Follow Up Instructions:  Return in about 2 months (around 07/28/2020), or if symptoms worsen or fail to improve, for Follow up with Dr. Carlis Abbott.   I discussed the assessment and treatment plan with the patient. The patient was provided an opportunity to ask questions and all were answered. The patient agreed with the plan and demonstrated an understanding of the instructions.   The patient was advised to call back or seek an in-person evaluation if the symptoms worsen or if the condition fails to improve as anticipated.  I provided 24 minutes of non-face-to-face time during this encounter.   Lauraine Rinne, NP

## 2020-05-28 NOTE — Assessment & Plan Note (Signed)
Plan: Referral to allergy asthma Continue loratadine Continue Flonase Continue Singulair

## 2020-05-28 NOTE — Patient Instructions (Addendum)
You were seen today by Coral Ceo, NP  for:   1. Severe persistent allergic asthma  - Ambulatory referral to Allergy  Breztri >>> 2 puffs in the morning right when you wake up, rinse out your mouth after use, 12 hours later 2 puffs, rinse after use >>> Take this daily, no matter what >>> This is not a rescue inhaler    2. Allergic rhinitis, unspecified seasonality, unspecified trigger  Continue Singulair Continue loratadine Continue Flonase  Start nasal saline rinses twice daily Use distilled water Shake well Get bottle lukewarm like a baby bottle    We recommend today:  Orders Placed This Encounter  Procedures  . Ambulatory referral to Allergy    Referral Priority:   Routine    Referral Type:   Allergy Testing    Referral Reason:   Specialty Services Required    Requested Specialty:   Allergy    Number of Visits Requested:   1   Orders Placed This Encounter  Procedures  . Ambulatory referral to Allergy   No orders of the defined types were placed in this encounter.   Follow Up:    Return in about 2 months (around 07/28/2020), or if symptoms worsen or fail to improve, for Follow up with Dr. Chestine Spore.   Please do your part to reduce the spread of COVID-19:      Reduce your risk of any infection  and COVID19 by using the similar precautions used for avoiding the common cold or flu:  Marland Kitchen Wash your hands often with soap and warm water for at least 20 seconds.  If soap and water are not readily available, use an alcohol-based hand sanitizer with at least 60% alcohol.  . If coughing or sneezing, cover your mouth and nose by coughing or sneezing into the elbow areas of your shirt or coat, into a tissue or into your sleeve (not your hands). Drinda Butts A MASK when in public  . Avoid shaking hands with others and consider head nods or verbal greetings only. . Avoid touching your eyes, nose, or mouth with unwashed hands.  . Avoid close contact with people who are  sick. . Avoid places or events with large numbers of people in one location, like concerts or sporting events. . If you have some symptoms but not all symptoms, continue to monitor at home and seek medical attention if your symptoms worsen. . If you are having a medical emergency, call 911.   ADDITIONAL HEALTHCARE OPTIONS FOR PATIENTS  [facility] / e-Visit: [email or web address]         [facility]: [phone]  [facility]: [phone]                   [facility]: [phone]     It is flu season:   >>> Best ways to protect herself from the flu: Receive the yearly flu vaccine, practice good hand hygiene washing with soap and also using hand sanitizer when available, eat a nutritious meals, get adequate rest, hydrate appropriately   Please contact the office if your symptoms worsen or you have concerns that you are not improving.   Thank you for choosing [facility] for your healthcare, and for allowing [place] to partner with you on your healthcare journey. I am thankful to be able to provide care to you today.   Elisha Headland FNP-C

## 2020-05-28 NOTE — Telephone Encounter (Signed)
PFTs reviewed- still has reversible obstruction consistent with poorly controlled asthma.  Elevated eosinophil count makes her a good candidate for Dupixent injections. She should continue all current therapies.  Steffanie Dunn, DO 05/28/20 6:27 PM [facility]

## 2020-05-28 NOTE — Assessment & Plan Note (Signed)
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  Patient feels that she is allergic to dust, mold, pollen, latex Patient reports acute worsened symptoms when outside  Plan: Referral to allergy Could consider biologic medications Continue Singulair Continue loratadine Continue Flonase Continue Breztri  Can start nasal saline rinses

## 2020-05-28 NOTE — Assessment & Plan Note (Signed)
Suspected physical deconditioning and asthma  Plan: Continue to work with physical therapy Referral to allergy asthma for formal allergy evaluation Could consider Biologics

## 2020-05-29 NOTE — Telephone Encounter (Signed)
Thanks

## 2020-05-29 NOTE — Telephone Encounter (Signed)
Called ans spoke with patient about paperwork for Dupixent. She is going to come by the office today to either pick it up or fill out her part there and then paperwork will go to [place] to sign.

## 2020-05-29 NOTE — Telephone Encounter (Signed)
Thanks!  LPC 

## 2020-05-30 ENCOUNTER — Telehealth: Payer: Self-pay | Admitting: Pharmacy Technician

## 2020-05-30 ENCOUNTER — Encounter: Payer: Self-pay | Admitting: Nurse Practitioner

## 2020-05-30 ENCOUNTER — Ambulatory Visit (INDEPENDENT_AMBULATORY_CARE_PROVIDER_SITE_OTHER): Payer: Medicare Other | Admitting: Nurse Practitioner

## 2020-05-30 ENCOUNTER — Other Ambulatory Visit: Payer: Self-pay

## 2020-05-30 VITALS — BP 130/76 | HR 75 | Ht 62.0 in | Wt 133.8 lb

## 2020-05-30 DIAGNOSIS — R002 Palpitations: Secondary | ICD-10-CM

## 2020-05-30 DIAGNOSIS — F3130 Bipolar disorder, current episode depressed, mild or moderate severity, unspecified: Secondary | ICD-10-CM | POA: Diagnosis not present

## 2020-05-30 DIAGNOSIS — R0789 Other chest pain: Secondary | ICD-10-CM

## 2020-05-30 DIAGNOSIS — R0602 Shortness of breath: Secondary | ICD-10-CM | POA: Diagnosis not present

## 2020-05-30 NOTE — Patient Instructions (Addendum)
After Visit Summary:  We will be checking the following labs today - NONE   Medication Instructions:    Continue with your current medicines.    If you need a refill on your cardiac medications before your next appointment, please call your pharmacy.     Testing/Procedures To Be Arranged:  N/A  Follow-Up:   See [place] as needed.     At [facility], you and your health needs are our priority.  As part of our continuing mission to provide you with exceptional heart care, we have created designated Provider Care Teams.  These Care Teams include your primary Cardiologist (physician) and Advanced Practice Providers (APPs -  Physician Assistants and Nurse Practitioners) who all work together to provide you with the care you need, when you need it.  Special Instructions:  . Stay safe, wash your hands for at least 20 seconds and wear a mask when needed.  . It was good to talk with you today.    Call the [facility] office at [phone] if you have any questions, problems or concerns.

## 2020-05-30 NOTE — Telephone Encounter (Signed)
600mg  loading dose Subsequently 300mg  Q2 weeks.   , DO 05/30/20 8:10 PM  Pulmonary & Critical Care

## 2020-05-30 NOTE — Telephone Encounter (Signed)
Received New start paperwork for Dupixent. Will update as we work through the benefits process.  Per notes, patient will receive via in office syringe. Can you please verify the requested dosage you would like patient to be on? The options are a 200mg /1.56ml and 300mg /16ml syringe.  Thanks! , CPhT

## 2020-05-31 NOTE — Telephone Encounter (Signed)
Thank you!   Submitted a Prior Authorization request to [facility] for DUPIXENTSyringe via Cover My Meds. Will update once we receive a response.   ([employer] Pap) - [id number]

## 2020-05-31 NOTE — Telephone Encounter (Signed)
Patient came by office and filled out paperwork for Dupixent and it was given to Pharmacy. Nothing further needed at this time.

## 2020-05-31 NOTE — Telephone Encounter (Signed)
Agree with Dr. Chestine Spore stated recommendations below.  Elisha Headland, FNP

## 2020-06-01 NOTE — Telephone Encounter (Signed)
Received notification from [facility] regarding a prior authorization for DUPIXENT 300MG /2ML SYRINGE. Authorization has been APPROVED from 06/01/20 to 12/07/20.   Authorization # 12/09/20 Phone # [phone]  Ran test claim, patient has zero copay for 1 month supply (Medicaid is secondary). Patient can fill through [facility].

## 2020-06-04 MED ORDER — DUPILUMAB 300 MG/2ML ~~LOC~~ SOSY: 600.0000 mg | PREFILLED_SYRINGE | Freq: Once | SUBCUTANEOUS | 0 refills | Status: DC

## 2020-06-04 MED ORDER — DUPILUMAB 300 MG/2ML ~~LOC~~ SOSY: 300.0000 mg | PREFILLED_SYRINGE | SUBCUTANEOUS | 11 refills | Status: DC

## 2020-06-04 MED FILL — DUPIXENT 300 MG/2 ML SAFE S: 300 | 28 days supply | Qty: 4 | Fill #0

## 2020-06-04 NOTE — Telephone Encounter (Signed)
Rx has been sent in. 

## 2020-06-05 ENCOUNTER — Telehealth: Payer: Self-pay | Admitting: Critical Care Medicine

## 2020-06-05 NOTE — Telephone Encounter (Signed)
Rachael brought over pt's Dupixent from [facility].  ATC pt, there was no answer and I could not leave a message will try back.

## 2020-06-05 NOTE — Telephone Encounter (Signed)
Prescription has been placed into medication fridge.

## 2020-06-07 NOTE — Telephone Encounter (Signed)
Pt calling back to check on med. Please advise.

## 2020-06-07 NOTE — Telephone Encounter (Signed)
Pt calling about receiving letter for approval Dupixent.  Please advise.

## 2020-06-08 ENCOUNTER — Other Ambulatory Visit: Payer: Self-pay | Admitting: Nurse Practitioner

## 2020-06-08 DIAGNOSIS — R002 Palpitations: Secondary | ICD-10-CM

## 2020-06-08 NOTE — Telephone Encounter (Signed)
This is a [place] pt, Dr. Tobb 

## 2020-06-12 MED ORDER — EPINEPHRINE 0.3 MG/0.3ML IJ SOAJ
0.3000 mg | Freq: Once | INTRAMUSCULAR | 11 refills | Status: AC
Start: 2020-06-12 — End: 2020-06-12

## 2020-06-12 NOTE — Telephone Encounter (Signed)
Patient scheduled 06/18/20 at 1000 for 1st Dupixent injection.  Epi pen prescription sent to requested [facility].  Patient is aware of 2 hour wait after 1st injection.  Nothing further at this time.

## 2020-06-14 ENCOUNTER — Other Ambulatory Visit: Payer: Self-pay

## 2020-06-14 DIAGNOSIS — R002 Palpitations: Secondary | ICD-10-CM

## 2020-06-14 MED ORDER — FUROSEMIDE 40 MG PO TABS: 40.0000 mg | ORAL_TABLET | Freq: Every day | ORAL | 3 refills | Status: DC

## 2020-06-14 MED ORDER — MIDODRINE HCL 5 MG PO TABS: 5.0000 mg | ORAL_TABLET | Freq: Three times a day (TID) | ORAL | 3 refills | Status: AC

## 2020-06-14 MED ORDER — PROPRANOLOL HCL 20 MG PO TABS: ORAL_TABLET | ORAL | 3 refills | Status: DC

## 2020-06-14 NOTE — Telephone Encounter (Signed)
Pt's medication was sent to pt's pharmacy as requested. Confirmation received.  °

## 2020-06-18 ENCOUNTER — Ambulatory Visit: Payer: Medicare Other

## 2020-06-20 ENCOUNTER — Other Ambulatory Visit: Payer: Self-pay

## 2020-06-20 ENCOUNTER — Ambulatory Visit (INDEPENDENT_AMBULATORY_CARE_PROVIDER_SITE_OTHER): Payer: Medicare Other

## 2020-06-20 DIAGNOSIS — J4550 Severe persistent asthma, uncomplicated: Secondary | ICD-10-CM | POA: Diagnosis not present

## 2020-06-20 MED ORDER — DUPILUMAB 300 MG/2ML ~~LOC~~ SOSY
600.0000 mg | PREFILLED_SYRINGE | Freq: Once | SUBCUTANEOUS | Status: AC
Start: 2020-06-20 — End: 2020-06-20
  Administered 2020-06-20: 600 mg via SUBCUTANEOUS

## 2020-06-20 NOTE — Progress Notes (Signed)
Patient presented to the office today for first-time Dupixent injection, 39 minutes past her appointment time.  Primary Pulmonologist: Zoe Lan DO Medication name: Dupixent Strength: 600mg  Site(s): L & R Arm  Epi pen/Auvi-Q visible during appointment: Yes  Time of injection: 1440  Patient evaluated every 15-20 minutes per protocol x2 hours.  1st check: 1500 Evaluation: No reaction.  2nd check: 1520  Evaluation: No reaction, given a blanket due to her being cold.  3rd check: 1540  Evaluation: No reaction.  4th check: 1600   Evaluation: No reaction.  5th check: 1620  Evaluation: No reaction.  6th check: 1635 Evaluation: No reaction.

## 2020-06-21 ENCOUNTER — Ambulatory Visit: Payer: Self-pay | Admitting: Allergy

## 2020-06-25 ENCOUNTER — Telehealth: Payer: Self-pay | Admitting: Critical Care Medicine

## 2020-06-25 ENCOUNTER — Other Ambulatory Visit: Payer: Self-pay | Admitting: *Deleted

## 2020-06-25 MED ORDER — DUPILUMAB 300 MG/2ML ~~LOC~~ SOSY: 300.0000 mg | PREFILLED_SYRINGE | SUBCUTANEOUS | 5 refills | Status: DC

## 2020-06-25 NOTE — Telephone Encounter (Signed)
Called [facility] pharmacy to check on Dupixent shipment.  [facility] will contact Patient 7/20, and discuss patient pick up or shipment to [facility].

## 2020-06-29 MED FILL — DUPIXENT 300 MG/2 ML SAFE S: 300 | 28 days supply | Qty: 4 | Fill #0

## 2020-07-02 NOTE — Telephone Encounter (Signed)
Dupixent Shipment Received: 300mg  #2 prefilled syringe Medication arrival date: 07/02/20 Lot #: [id number] Exp date: 10/07/2022 Received by: 10/09/2022

## 2020-07-04 ENCOUNTER — Ambulatory Visit (INDEPENDENT_AMBULATORY_CARE_PROVIDER_SITE_OTHER): Payer: Medicare Other

## 2020-07-04 ENCOUNTER — Other Ambulatory Visit: Payer: Self-pay

## 2020-07-04 DIAGNOSIS — J4550 Severe persistent asthma, uncomplicated: Secondary | ICD-10-CM | POA: Diagnosis not present

## 2020-07-04 MED ORDER — DUPILUMAB 300 MG/2ML ~~LOC~~ SOSY
300.0000 mg | PREFILLED_SYRINGE | Freq: Once | SUBCUTANEOUS | Status: AC
  Administered 2020-07-04: 300 mg via SUBCUTANEOUS

## 2020-07-04 NOTE — Progress Notes (Signed)
All questions were answered by the patient before medication was administered. Have you been hospitalized in the last 10 days? No Do you have a fever? No Do you have a cough? No Do you have a headache or sore throat? No  

## 2020-07-18 ENCOUNTER — Other Ambulatory Visit: Payer: Self-pay

## 2020-07-18 ENCOUNTER — Ambulatory Visit (INDEPENDENT_AMBULATORY_CARE_PROVIDER_SITE_OTHER): Payer: Medicare Other

## 2020-07-18 DIAGNOSIS — J4550 Severe persistent asthma, uncomplicated: Secondary | ICD-10-CM

## 2020-07-18 MED ORDER — DUPILUMAB 300 MG/2ML ~~LOC~~ SOSY
300.0000 mg | PREFILLED_SYRINGE | Freq: Once | SUBCUTANEOUS | Status: AC
  Administered 2020-07-18: 300 mg via SUBCUTANEOUS

## 2020-07-18 NOTE — Progress Notes (Signed)
All questions were answered by the patient before medication was administered. Have you been hospitalized in the last 10 days? No Do you have a fever? No Do you have a cough? No Do you have a headache or sore throat? No  

## 2020-07-30 ENCOUNTER — Telehealth: Payer: Self-pay | Admitting: Critical Care Medicine

## 2020-07-30 MED FILL — DUPIXENT 300 MG/2 ML SAFE S: 300 | 28 days supply | Qty: 4 | Fill #1

## 2020-07-30 NOTE — Telephone Encounter (Signed)
Dupixent Shipment Received: 300mg  #2 prefilled syringe Medication arrival date: 07/30/20 Lot #: [id number] Exp date: 11/06/2022 Received by: 11/08/2022

## 2020-07-31 ENCOUNTER — Ambulatory Visit: Payer: Medicare Other | Attending: Internal Medicine

## 2020-07-31 DIAGNOSIS — Z23 Encounter for immunization: Secondary | ICD-10-CM

## 2020-07-31 NOTE — Progress Notes (Signed)
   Covid-19 Vaccination Clinic  Name:  Leslie Sanders    MRN: [id number] DOB: [birth year]  07/31/2020  [name] was observed post Covid-19 immunization for 30 minutes based on pre-vaccination screening without incident. She was provided with Vaccine Information Sheet and instruction to access the V-Safe system.   [name] was instructed to call 911 with any severe reactions post vaccine: Marland Kitchen Difficulty breathing  . Swelling of face and throat  . A fast heartbeat  . A bad rash all over body  . Dizziness and weakness

## 2020-08-03 ENCOUNTER — Ambulatory Visit (INDEPENDENT_AMBULATORY_CARE_PROVIDER_SITE_OTHER): Payer: Medicare Other

## 2020-08-03 ENCOUNTER — Other Ambulatory Visit: Payer: Self-pay

## 2020-08-03 DIAGNOSIS — J4550 Severe persistent asthma, uncomplicated: Secondary | ICD-10-CM

## 2020-08-03 MED ORDER — DUPILUMAB 300 MG/2ML ~~LOC~~ SOSY
300.0000 mg | PREFILLED_SYRINGE | Freq: Once | SUBCUTANEOUS | Status: AC
  Administered 2020-08-03: 300 mg via SUBCUTANEOUS

## 2020-08-03 NOTE — Progress Notes (Signed)
Have you been hospitalized within the last 10 days?  No Do you have a fever?  No Do you have a cough?  No Do you have a headache or sore throat? No Do you have your Epi Pen visible and is it within date?  Yes 

## 2020-08-15 ENCOUNTER — Other Ambulatory Visit: Payer: Self-pay

## 2020-08-15 ENCOUNTER — Encounter: Payer: Self-pay | Admitting: Critical Care Medicine

## 2020-08-15 ENCOUNTER — Ambulatory Visit (INDEPENDENT_AMBULATORY_CARE_PROVIDER_SITE_OTHER): Payer: Medicare Other | Admitting: Critical Care Medicine

## 2020-08-15 VITALS — BP 116/68 | HR 69 | Temp 98.4°F | Ht 62.0 in | Wt 133.6 lb

## 2020-08-15 DIAGNOSIS — J4550 Severe persistent asthma, uncomplicated: Secondary | ICD-10-CM

## 2020-08-15 DIAGNOSIS — J309 Allergic rhinitis, unspecified: Secondary | ICD-10-CM

## 2020-08-15 DIAGNOSIS — K219 Gastro-esophageal reflux disease without esophagitis: Secondary | ICD-10-CM | POA: Diagnosis not present

## 2020-08-15 MED ORDER — ALBUTEROL SULFATE (2.5 MG/3ML) 0.083% IN NEBU: 2.5000 mg | INHALATION_SOLUTION | Freq: Four times a day (QID) | RESPIRATORY_TRACT | 1 refills | Status: DC | PRN

## 2020-08-15 MED ORDER — ALBUTEROL SULFATE HFA 108 (90 BASE) MCG/ACT IN AERS: 1.0000 | INHALATION_SPRAY | Freq: Four times a day (QID) | RESPIRATORY_TRACT | 11 refills | Status: DC | PRN

## 2020-08-15 MED ORDER — AZELASTINE HCL 0.1 % NA SOLN: 2.0000 | Freq: Two times a day (BID) | NASAL | 12 refills | Status: DC

## 2020-08-15 NOTE — Patient Instructions (Addendum)
Thank you for visiting Dr. Chestine Spore at [facility]. We recommend the following:  Keep all meds the same. Start taking azelastine nasal spray to dry up drainage.  Meds ordered this encounter  Medications  . azelastine (ASTELIN) 0.1 % nasal spray    Sig: Place 2 sprays into both nostrils 2 (two) times daily. Use in each nostril as directed    Dispense:  30 mL    Refill:  12  . albuterol (PROVENTIL) (2.5 MG/3ML) 0.083% nebulizer solution    Sig: Take 3 mLs (2.5 mg total) by nebulization every 6 (six) hours as needed for wheezing or shortness of breath (Inhale 58ml every 6 hours and as needed.).    Dispense:  360 mL    Refill:  1    DX J45.50  . albuterol (PROAIR HFA) 108 (90 Base) MCG/ACT inhaler    Sig: Inhale 1 puff into the lungs every 6 (six) hours as needed for wheezing or shortness of breath.    Dispense:  8 g    Refill:  11    Return in about 3 months (around 11/14/2020). with Dr. Celine Mans (30 minute visit).    Please do your part to reduce the spread of COVID-19.

## 2020-08-15 NOTE — Progress Notes (Signed)
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 (two) times daily., Disp: , Rfl:  .  chlorhexidine (PERIDEX) 0.12 % solution, SMARTSIG:By Mouth, Disp: , Rfl:  .  cyanocobalamin (,VITAMIN B-12,) 1000 MCG/ML injection, Inject 1 mL into the muscle every 30 (thirty) days., Disp: , Rfl:  .  docusate sodium (COLACE) 100 MG capsule, Take 100 mg by mouth daily., Disp: , Rfl:  .  DULoxetine (CYMBALTA) 60 MG capsule, Take 60 mg by mouth daily. , Disp: , Rfl:  .  dupilumab (DUPIXENT) 300 MG/2ML prefilled syringe, Inject 300 mg into the skin every 14 (fourteen) days., Disp: 4 mL, Rfl: 5 .  ferrous sulfate 325 (65 FE) MG tablet, Take 1 tablet (325 mg total) by mouth 2 (two) times daily with a meal., Disp: 60 tablet, Rfl: 0 .  fluticasone (FLONASE) 50 MCG/ACT nasal spray, Place 2 sprays into both nostrils daily., Disp: 16 g, Rfl: 11 .  folic acid (FOLVITE) 1 MG tablet, Take 1 mg by mouth daily. , Disp: , Rfl:  .  furosemide (LASIX) 40 MG tablet, Take 1 tablet (40 mg total) by mouth daily., Disp: 90 tablet, Rfl: 3 .   glycopyrrolate (ROBINUL) 2 MG tablet, Take 1 tablet (2 mg total) by mouth 2 (two) times daily., Disp: 180 tablet, Rfl: 3 .  hydrOXYzine (ATARAX/VISTARIL) 25 MG tablet, Take 1 tablet (25 mg total) by mouth every 6 (six) hours as needed for anxiety., Disp: 12 tablet, Rfl: 0 .  midodrine (PROAMATINE) 5 MG tablet, Take 1 tablet (5 mg total) by mouth 3 (three) times daily with meals., Disp: 270 tablet, Rfl: 3 .  montelukast (SINGULAIR) 10 MG tablet, Take 10 mg by mouth daily. , Disp: , Rfl:  .  omeprazole (PRILOSEC) 40 MG capsule, Take 1 capsule (40 mg total) by mouth daily., Disp: 90 capsule, Rfl: 3 .  pantoprazole (PROTONIX) 40 MG tablet, Take 1 tablet (40 mg total) by mouth daily., Disp: 30 tablet, Rfl: 11 .  pregabalin (LYRICA) 150 MG capsule, Take 150 mg by mouth 2 (two) times daily., Disp: , Rfl:  .  propranolol (INDERAL) 20 MG tablet, TAKE 1 TABLET(20  MG) BY MOUTH TWICE DAILY, Disp: 180 tablet, Rfl: 3 .  tolterodine (DETROL LA) 4 MG 24 hr capsule, Take 1 capsule by mouth daily., Disp: , Rfl:  .  Vitamin D, Ergocalciferol, (DRISDOL) 1.25 MG (50000 UT) CAPS capsule, Take 50,000 Units by mouth every Monday. , Disp: , Rfl:    [name], DO [facility] 08/15/2020 3:56 PM

## 2020-08-17 ENCOUNTER — Ambulatory Visit (INDEPENDENT_AMBULATORY_CARE_PROVIDER_SITE_OTHER): Payer: Medicare Other

## 2020-08-17 ENCOUNTER — Other Ambulatory Visit: Payer: Self-pay

## 2020-08-17 DIAGNOSIS — J4550 Severe persistent asthma, uncomplicated: Secondary | ICD-10-CM | POA: Diagnosis not present

## 2020-08-17 MED ORDER — DUPILUMAB 300 MG/2ML ~~LOC~~ SOSY
300.0000 mg | PREFILLED_SYRINGE | Freq: Once | SUBCUTANEOUS | Status: AC
  Administered 2020-08-17: 300 mg via SUBCUTANEOUS

## 2020-08-17 NOTE — Progress Notes (Signed)
Have you been hospitalized within the last 10 days?  No Do you have a fever?  No Do you have a cough?  No Do you have a headache or sore throat? No Do you have your Epi Pen visible and is it within date?  Yes 

## 2020-08-28 MED FILL — DUPIXENT 300 MG/2 ML SAFE S: 300 | 28 days supply | Qty: 4 | Fill #2

## 2020-08-29 ENCOUNTER — Telehealth: Payer: Self-pay | Admitting: Critical Care Medicine

## 2020-08-29 NOTE — Telephone Encounter (Signed)
Dupixent Shipment Received: 300mg  #2 prefilled syringe Medication arrival date: 08/29/20 Lot #: [id number] Exp date: 01/07/2023 Received by: 01/09/2023

## 2020-08-31 ENCOUNTER — Ambulatory Visit: Payer: Medicare Other

## 2020-08-31 ENCOUNTER — Telehealth: Payer: Self-pay | Admitting: Critical Care Medicine

## 2020-08-31 NOTE — Telephone Encounter (Signed)
Patient rescheduled 09/04/20, at 1515.

## 2020-09-04 ENCOUNTER — Other Ambulatory Visit: Payer: Self-pay

## 2020-09-04 ENCOUNTER — Ambulatory Visit (INDEPENDENT_AMBULATORY_CARE_PROVIDER_SITE_OTHER): Payer: Medicare Other

## 2020-09-04 DIAGNOSIS — J4550 Severe persistent asthma, uncomplicated: Secondary | ICD-10-CM | POA: Diagnosis not present

## 2020-09-04 MED ORDER — DUPILUMAB 300 MG/2ML ~~LOC~~ SOSY
300.0000 mg | PREFILLED_SYRINGE | Freq: Once | SUBCUTANEOUS | Status: AC
  Administered 2020-09-04: 300 mg via SUBCUTANEOUS

## 2020-09-04 NOTE — Progress Notes (Signed)
Cardiology Office Note    Date:  09/05/2020   ID:  Leslie Sanders, DOB 1976/07/15, MRN [id number]  PCP:  Lynn Ito, MD  Cardiologist: Gypsy Balsam, MD EPS: None  Chief Complaint  Patient presents with  . Dizziness    History of Present Illness:  Leslie Sanders is a 44 y.o. female with history of hypertension, chronic diastolic dysfunction, former smoker, anxiety/bipolar disorder, gastric bypass surgery, COPD.  Chest pain shortness of breath 01/2020 and had normal cardiac catheterization with low wedge and normal right heart pressures.  She was referred see pulmonary in follow-up.  She then had palpitations and monitor placed showed predominantly normal sinus rhythm with 4 SVT runs the fastest 197 bpm 4 beats long the longest was 14 beats with an average heart rate 135 bpm.  Last saw in [name], NP 05/30/2020 and was complaining of dizziness and falling.  Was only taking Inderal once a day otherwise she feels too tired.  She wanted to know what was wrong with her heart.  Recommend follow-up with pulmonary.  Added onto my schedule for blurred vision but says vision goes black, is dizzy. Occurred with driving. Eye doctor told her to come here and check her carotids. Happened twice. Is careful when she gets up. . Complains of ongoing palpitations lasting 3 min and rubs her chest. Says the "midodrine" isn't helping her palpitations. When I told her that midodrine wasn't for palpitations she wasn't sure. Drinks 1 cup tea daily. Was also taking zyrtec D for a month. Was also taking lasix 40 mg daily for leg swelling but stopped one month ago. Referred to endocrine for abnormal thyroid-TSH 0.41. Free T3 normal, potassium 3.2. was in [facility] 2 weeks ago for ear infection and had labs that aren't available.  Past Medical History:  Diagnosis Date  . Anemia    (a) chronic blood loss hx of menorrhagia s/p endometrial biopsy 04/2015 (b) iron deficient  . Anxiety   . B12 deficiency    . Bipolar 1 disorder (HCC)   . Cervical spinal stenosis   . Cervical spondylosis without myelopathy 07/08/2016  . Chronic pain of right thumb 04/28/2018  . Depression   . GERD (gastroesophageal reflux disease)   . Hemorrhoids    Pt with constipation secondary to iron supplement. Pt has had rectal bleed secondary to internal hemorrhoids. Pt is s/p EGD/colonoscopy 05/2012  . 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 Migraine   . PONV (postoperative nausea and vomiting)   . PTSD (post-traumatic stress disorder)   . Seasonal allergies   . Vitamin E deficiency   . Zinc deficiency     Past Surgical History:  Procedure Laterality Date  . ABDOMINAL HYSTERECTOMY    . BREAST LUMPECTOMY Bilateral   . BREAST REDUCTION SURGERY    . Carpal Metacarpal Arthroplasty Right 05/13/2019  . CHOLECYSTECTOMY  1990  . HAND SURGERY    . HYSTERECTOMY ABDOMINAL WITH SALPINGECTOMY  11/2017  . POSTERIOR CERVICAL LAMINECTOMY Bilateral 05/23/2019  . RIGHT/LEFT HEART CATH AND CORONARY ANGIOGRAPHY N/A 01/13/2020   Procedure: RIGHT/LEFT HEART CATH AND CORONARY ANGIOGRAPHY;  Surgeon: Lyn Records, MD;  Location: [facility];  Service: Cardiovascular;  Laterality: N/A;  . ROUX-EN-Y GASTRIC BYPASS  2003   in [place]  . SPINAL FUSION  05/23/2019    Current Medications: Current Meds  Medication Sig  . albuterol (PROAIR HFA) 108 (90 Base) MCG/ACT inhaler Inhale 1 puff into the lungs every 6 (  six) hours as needed for wheezing or shortness of breath.  Marland Kitchen albuterol (PROVENTIL) (2.5 MG/3ML) 0.083% nebulizer solution Take 3 mLs (2.5 mg total) by nebulization every 6 (six) hours as needed for wheezing or shortness of breath (Inhale 47ml every 6 hours and as needed.).  Marland Kitchen azelastine (ASTELIN) 0.1 % nasal spray Place 2 sprays into both nostrils 2 (two) times daily. Use in each nostril as directed  . Budeson-Glycopyrrol-Formoterol (BREZTRI AEROSPHERE) 160-9-4.8 MCG/ACT AERO Inhale 2 puffs into the lungs 2 (two) times  daily.  . busPIRone (BUSPAR) 5 MG tablet Take 5 mg by mouth 2 (two) times daily.  . chlorhexidine (PERIDEX) 0.12 % solution SMARTSIG:By Mouth  . cyanocobalamin (,VITAMIN B-12,) 1000 MCG/ML injection Inject 1 mL into the muscle every 30 (thirty) days.  Marland Kitchen docusate sodium (COLACE) 100 MG capsule Take 100 mg by mouth daily.  . DULoxetine (CYMBALTA) 60 MG capsule Take 60 mg by mouth daily.   . dupilumab (DUPIXENT) 300 MG/2ML prefilled syringe Inject 300 mg into the skin every 14 (fourteen) days.  . ferrous sulfate 325 (65 FE) MG tablet Take 1 tablet (325 mg total) by mouth 2 (two) times daily with a meal.  . fluticasone (FLONASE) 50 MCG/ACT nasal spray Place 2 sprays into both nostrils daily.  . folic acid (FOLVITE) 1 MG tablet Take 1 mg by mouth daily.   . furosemide (LASIX) 40 MG tablet Take 1 tablet (40 mg total) by mouth daily.  Marland Kitchen glycopyrrolate (ROBINUL) 2 MG tablet Take 1 tablet (2 mg total) by mouth 2 (two) times daily.  . hydrOXYzine (ATARAX/VISTARIL) 25 MG tablet Take 1 tablet (25 mg total) by mouth every 6 (six) hours as needed for anxiety.  . midodrine (PROAMATINE) 5 MG tablet Take 1 tablet (5 mg total) by mouth 3 (three) times daily with meals.  . montelukast (SINGULAIR) 10 MG tablet Take 10 mg by mouth daily.   Marland Kitchen omeprazole (PRILOSEC) 40 MG capsule Take 1 capsule (40 mg total) by mouth daily.  . pantoprazole (PROTONIX) 40 MG tablet Take 1 tablet (40 mg total) by mouth daily.  . pregabalin (LYRICA) 150 MG capsule Take 150 mg by mouth 2 (two) times daily.  . propranolol (INDERAL) 20 MG tablet TAKE 1 TABLET(20 MG) BY MOUTH THREE TO FOUR TIMES DAILY AS NEEDED  . tolterodine (DETROL LA) 4 MG 24 hr capsule Take 1 capsule by mouth daily.  . Vitamin D, Ergocalciferol, (DRISDOL) 1.25 MG (50000 UT) CAPS capsule Take 50,000 Units by mouth every Monday.   . [DISCONTINUED] propranolol (INDERAL) 20 MG tablet TAKE 1 TABLET(20 MG) BY MOUTH TWICE DAILY     Allergies:   Tylenol [acetaminophen],  Adhesive  [tape], Hydrocodone-acetaminophen, Nsaids, and Latex   Social History   Socioeconomic History  . Marital status: Divorced    Spouse name: Not on file  . Number of children: Not on file  . Years of education: Not on file  . Highest education level: Not on file  Occupational History  . Not on file  Tobacco Use  . Smoking status: Former Smoker    Packs/day: 2.00    Years: 23.00    Pack years: 46.00    Types: Cigarettes    Quit date: 12/08/2018    Years since quitting: 1.7  . Smokeless tobacco: Never Used  Vaping Use  . Vaping Use: Never used  Substance and Sexual Activity  . Alcohol use: Not Currently    Alcohol/week: 28.0 standard drinks    Types: 28 Cans of beer  per week    Comment: Nothing in 4 years  . Drug use: No  . Sexual activity: Not Currently  Other Topics Concern  . Not on file  Social History Narrative  . Not on file   Social Determinants of Health   Financial Resource Strain:   . Difficulty of Paying Living Expenses: Not on file  Food Insecurity:   . Worried About Programme researcher, broadcasting/film/video in the Last Year: Not on file  . Ran Out of Food in the Last Year: Not on file  Transportation Needs:   . Lack of Transportation (Medical): Not on file  . Lack of Transportation (Non-Medical): Not on file  Physical Activity:   . Days of Exercise per Week: Not on file  . Minutes of Exercise per Session: Not on file  Stress:   . Feeling of Stress : Not on file  Social Connections:   . Frequency of Communication with Friends and Family: Not on file  . Frequency of Social Gatherings with Friends and Family: Not on file  . Attends Religious Services: Not on file  . Active Member of Clubs or Organizations: Not on file  . Attends Banker Meetings: Not on file  . Marital Status: Not on file     Family History:  The patient's family history includes ADD / ADHD in her son; Arthritis/Rheumatoid in her mother; Breast cancer in her mother; COPD in her maternal  grandmother and paternal grandmother; HIV in her father; Lupus in her mother; ODD in her son; Rheum arthritis in her maternal grandmother; Schizophrenia in her maternal aunt.   ROS:   Please see the history of present illness.    ROS All other systems reviewed and are negative.   PHYSICAL EXAM:   VS:  BP 120/76   Pulse 71   Ht 5\' 2"  (1.575 m)   Wt 126 lb 12.8 oz (57.5 kg)   LMP 10/07/2016   SpO2 96%   BMI 23.19 kg/m   Physical Exam  GEN: Thin, in no acute distress  Neck: no JVD, carotid bruits, or masses Cardiac:RRR; no murmurs, rubs, or gallops  Respiratory:  clear to auscultation bilaterally, normal work of breathing GI: soft, nontender, nondistended, + BS Ext: without cyanosis, clubbing, or edema, Good distal pulses bilaterally Neuro:  Alert and Oriented x 3 Psych: euthymic mood, full affect  Wt Readings from Last 3 Encounters:  09/05/20 126 lb 12.8 oz (57.5 kg)  08/15/20 133 lb 9.6 oz (60.6 kg)  05/30/20 133 lb 12.8 oz (60.7 kg)      Studies/Labs Reviewed:   EKG:  EKG is not ordered today.    Recent Labs: 12/20/2019: NT-Pro BNP 60 01/09/2020: BUN 10; Creatinine, Ser 0.67 01/13/2020: Potassium 3.1; Sodium 143 03/14/2020: Hemoglobin 10.9; Platelets 365.0   Lipid Panel    Component Value Date/Time   CHOL 173 11/29/2016 0644   TRIG 84 11/29/2016 0644   HDL 88 11/29/2016 0644   CHOLHDL 2.0 11/29/2016 0644   VLDL 17 11/29/2016 0644   LDLCALC 68 11/29/2016 0644    Additional studies/ records that were reviewed today include:   Zio monitor  The patient wore the monitor for 13 days 10 hours starting February 02, 2020. Indication: Palpitations The minimum heart rate was 48 bpm, maximum heart rate was 197 bpm, and average heart rate was 82 bpm. Predominant underlying rhythm was Sinus Rhythm.   4 Supraventricular Tachycardia runs occurred, the run with the fastest interval lasting 4 beats with a maximum  rate of 197 bpm, the longest lasting 14 beats with an average rate  of 135 bpm.    Premature atrial complexes were rare (<1.0%). Premature Ventricular complexes were rare (<1.0%).   No ventricular tachycardia, no pauses, No AV block and no atrial fibrillation present. 15 patient triggered events noted 1 associated with premature atrial complex the remaining associated with sinus rhythm.  4 diary events all associated with sinus rhythm.   Conclusion: This study is remarkable for the following:               1.  Asymptomatic paroxysmal supraventricular tachycardia which is likely atrial tachycardia with variable block.                 2.  Rare symptomatic premature atrial complexes.   Right and left heart catheterization 01/2020  Normal pulmonary artery pressures  Low pulmonary capillary wedge pressure, 6 mmHg.  Right dominant coronary artery anatomy.  Normal coronary arteries without evidence of obstruction or luminal irregularity  Normal left ventricular filling pressures.  Normal left ventricular systolic function.  Normal cardiac output.    Oximetry did not reveal any evidence of left to right shunting   RECOMMENDATIONS:    Hyperpnea with resultant hyperventilation syndrome.  Findings after reviewing all studies suggest psychiatric or central nervous system etiology.  Discontinuation of diuretic therapy would seem appropriate given relatively low filling pressures.     ECHO IMPRESSIONS 12/2019   1. Left ventricular ejection fraction, by visual estimation, is 60 to  65%. The left ventricle has normal function. There is no left ventricular  hypertrophy.   2. Left ventricular diastolic parameters are consistent with Grade I  diastolic dysfunction (impaired relaxation).   3. The left ventricle has no regional wall motion abnormalities.   4. Normal pulmonary artery systolic pressure.   5. The inferior vena cava is dilated in size with <50% respiratory  variability, suggesting right atrial pressure of 15 mmHg.    Current medicines are  reviewed with the patient today.  The patient does not have concerns regarding medicines other than what has been noted above.      ASSESSMENT:    1. Palpitations   2. Dizziness   3. Abnormal thyroid blood test   4. Essential hypertension   5. Chronic diastolic CHF (congestive heart failure) (HCC)      PLAN:  In order of problems listed above:  Dizziness with vision turning black but no true syncope.  Has occurred at rest.  Not necessarily associated with palpitations.  She would like to have carotid Dopplers done because her ophthalmologist told her it could be coming from her carotids.  No bruits on exam.  Palpitations monitor mostly NSR with some SVT very short lived-I told her she can increase her propanolol to 20 mg 3-4 times daily as needed for palpitations.  No decongestants, decrease caffeine.  If she is truly high for thyroid this could be contributing to her palpitations and all of her symptoms.  Keep follow-up with endocrine.  Abnormal thyroid studies to see endocrinologist  Chronic shortness of breath-COPD, cardiac workup unrevealing.  She stopped Lasix a month ago.  No edema on exam.  I told her to only take this as needed.  History of chest pain-normal cath  Anxiety/bipolar    Medication Adjustments/Labs and Tests Ordered: Current medicines are reviewed at length with the patient today.  Concerns regarding medicines are outlined above.  Medication changes, Labs and Tests ordered today are listed in the Patient  Instructions below. Patient Instructions  Medication Instructions:  Your physician has recommended you make the following change in your medication:   INCREASE Inderal 20mg  3-4 times daily as needed   *If you need a refill on your cardiac medications before your next appointment, please call your pharmacy*   Lab Work: TODAY: BMET  If you have labs (blood work) drawn today and your tests are completely normal, you will receive your results only  by: Marland Kitchen. MyChart Message (if you have MyChart) OR . A paper copy in the mail If you have any lab test that is abnormal or we need to change your treatment, we will call you to review the results.   Testing/Procedures: Your physician has requested that you have a carotid duplex. This test is an ultrasound of the carotid arteries in your neck. It looks at blood flow through these arteries that supply the brain with blood. Allow one hour for this exam. There are no restrictions or special instructions.     Follow-Up: At [facility], you and your health needs are our priority.  As part of our continuing mission to provide you with exceptional heart care, we have created designated Provider Care Teams.  These Care Teams include your primary Cardiologist (physician) and Advanced Practice Providers (APPs -  Physician Assistants and Nurse Practitioners) who all work together to provide you with the care you need, when you need it.  We recommend signing up for the patient portal called "MyChart".  Sign up information is provided on this After Visit Summary.  MyChart is used to connect with patients for Virtual Visits (Telemedicine).  Patients are able to view lab/test results, encounter notes, upcoming appointments, etc.  Non-urgent messages can be sent to your provider as well.   To learn more about what you can do with MyChart, go to [email or web address].    Your next appointment:   10/31/2020  The format for your next appointment:   In Person  Provider:   Verdis PrimeHenry Smith, MD   Other Instructions None     Signed, Jacolyn ReedyMichele Misbah Hornaday, PA-C  09/05/2020 12:41 PM    [facility] [address], [place], [place]  [address] Phone: [phone]; Fax: [phone]

## 2020-09-04 NOTE — Progress Notes (Signed)
Have you been hospitalized within the last 10 days?  No Do you have a fever?  No Do you have a cough?  No Do you have a headache or sore throat? No Do you have your Epi Pen visible and is it within date?  Yes 

## 2020-09-05 ENCOUNTER — Encounter: Payer: Self-pay | Admitting: Physician Assistant

## 2020-09-05 ENCOUNTER — Ambulatory Visit (INDEPENDENT_AMBULATORY_CARE_PROVIDER_SITE_OTHER): Payer: Medicare Other | Admitting: Physician Assistant

## 2020-09-05 VITALS — BP 120/76 | HR 71 | Ht 62.0 in | Wt 126.8 lb

## 2020-09-05 DIAGNOSIS — R7989 Other specified abnormal findings of blood chemistry: Secondary | ICD-10-CM

## 2020-09-05 DIAGNOSIS — R002 Palpitations: Secondary | ICD-10-CM

## 2020-09-05 DIAGNOSIS — I10 Essential (primary) hypertension: Secondary | ICD-10-CM | POA: Diagnosis not present

## 2020-09-05 DIAGNOSIS — R42 Dizziness and giddiness: Secondary | ICD-10-CM

## 2020-09-05 DIAGNOSIS — I5032 Chronic diastolic (congestive) heart failure: Secondary | ICD-10-CM

## 2020-09-05 LAB — BASIC METABOLIC PANEL
BUN/Creatinine Ratio: 9 (ref 9–23)
BUN: 7 mg/dL (ref 6–24)
CO2: 23 mmol/L (ref 20–29)
Calcium: 10.1 mg/dL (ref 8.7–10.2)
Chloride: 98 mmol/L (ref 96–106)
Creatinine, Ser: 0.79 mg/dL (ref 0.57–1.00)
GFR calc Af Amer: 105 mL/min/{1.73_m2} (ref >59)
GFR calc non Af Amer: 91 mL/min/{1.73_m2} (ref >59)
Glucose: 117 mg/dL — ABNORMAL HIGH (ref 65–99)
Potassium: 4.4 mmol/L (ref 3.5–5.2)
Sodium: 138 mmol/L (ref 134–144)

## 2020-09-05 MED ORDER — PROPRANOLOL HCL 20 MG PO TABS: ORAL_TABLET | ORAL | 3 refills | Status: DC

## 2020-09-05 NOTE — Patient Instructions (Signed)
Medication Instructions:  Your physician has recommended you make the following change in your medication:   INCREASE Inderal 20mg  3-4 times daily as needed   *If you need a refill on your cardiac medications before your next appointment, please call your pharmacy*   Lab Work: TODAY: BMET  If you have labs (blood work) drawn today and your tests are completely normal, you will receive your results only by: MyChart Message (if you have MyChart) OR . A paper copy in the mail If you have any lab test that is abnormal or we need to change your treatment, we will call you to review the results.   Testing/Procedures: Your physician has requested that you have a carotid duplex. This test is an ultrasound of the carotid arteries in your neck. It looks at blood flow through these arteries that supply the brain with blood. Allow one hour for this exam. There are no restrictions or special instructions.     Follow-Up: At [facility], you and your health needs are our priority.  As part of our continuing mission to provide you with exceptional heart care, we have created designated Provider Care Teams.  These Care Teams include your primary Cardiologist (physician) and Advanced Practice Providers (APPs -  Physician Assistants and Nurse Practitioners) who all work together to provide you with the care you need, when you need it.  We recommend signing up for the patient portal called "MyChart".  Sign up information is provided on this After Visit Summary.  MyChart is used to connect with patients for Virtual Visits (Telemedicine).  Patients are able to view lab/test results, encounter notes, upcoming appointments, etc.  Non-urgent messages can be sent to your provider as well.   To learn more about what you can do with MyChart, go to [facility].    Your next appointment:   10/31/2020  The format for your next appointment:   In Person  Provider:   11/02/2020, MD   Other  Instructions None

## 2020-09-13 ENCOUNTER — Other Ambulatory Visit: Payer: Self-pay

## 2020-09-13 ENCOUNTER — Ambulatory Visit (HOSPITAL_COMMUNITY)
Admission: RE | Admit: 2020-09-13 | Discharge: 2020-09-13 | Disposition: A | Discharge status: Home / Self Care | Payer: Medicare Other | Source: Ambulatory Visit | Attending: Cardiology | Admitting: Cardiology

## 2020-09-13 DIAGNOSIS — R002 Palpitations: Secondary | ICD-10-CM | POA: Diagnosis present

## 2020-09-13 DIAGNOSIS — R42 Dizziness and giddiness: Secondary | ICD-10-CM | POA: Diagnosis present

## 2020-09-13 DIAGNOSIS — R7989 Other specified abnormal findings of blood chemistry: Secondary | ICD-10-CM | POA: Diagnosis not present

## 2020-09-18 ENCOUNTER — Ambulatory Visit (INDEPENDENT_AMBULATORY_CARE_PROVIDER_SITE_OTHER): Payer: Medicare Other

## 2020-09-18 ENCOUNTER — Other Ambulatory Visit: Payer: Self-pay

## 2020-09-18 DIAGNOSIS — J4550 Severe persistent asthma, uncomplicated: Secondary | ICD-10-CM

## 2020-09-18 MED ORDER — DUPILUMAB 300 MG/2ML ~~LOC~~ SOSY
300.0000 mg | PREFILLED_SYRINGE | Freq: Once | SUBCUTANEOUS | Status: AC
  Administered 2020-09-18: 300 mg via SUBCUTANEOUS

## 2020-09-18 NOTE — Progress Notes (Signed)
Have you been hospitalized within the last 10 days?  No Do you have a fever?  No Do you have a cough?  No Do you have a headache or sore throat? No Do you have your Epi Pen visible and is it within date?  Yes 

## 2020-09-27 MED FILL — DUPIXENT 300 MG/2 ML SAFE S: 300 | 28 days supply | Qty: 4 | Fill #3

## 2020-09-28 ENCOUNTER — Telehealth: Payer: Self-pay | Admitting: Critical Care Medicine

## 2020-09-28 NOTE — Telephone Encounter (Signed)
Dupixent Shipment Received: 300mg  #2 prefilled syringe Medication arrival date: 09/28/20 Lot #: [id number] Exp date: 02/04/2023 Received by: 02/06/2023

## 2020-10-02 ENCOUNTER — Other Ambulatory Visit: Payer: Self-pay

## 2020-10-02 ENCOUNTER — Ambulatory Visit (INDEPENDENT_AMBULATORY_CARE_PROVIDER_SITE_OTHER): Payer: Medicare Other

## 2020-10-02 DIAGNOSIS — J4550 Severe persistent asthma, uncomplicated: Secondary | ICD-10-CM

## 2020-10-02 MED ORDER — DUPILUMAB 300 MG/2ML ~~LOC~~ SOSY
300.0000 mg | PREFILLED_SYRINGE | Freq: Once | SUBCUTANEOUS | Status: AC
  Administered 2020-10-02: 300 mg via SUBCUTANEOUS

## 2020-10-02 NOTE — Progress Notes (Signed)
Have you been hospitalized within the last 10 days?  No Do you have a fever?  No Do you have a cough?  No Do you have a headache or sore throat? No Do you have your Epi Pen visible and is it within date?  Yes 

## 2020-10-16 ENCOUNTER — Other Ambulatory Visit: Payer: Self-pay

## 2020-10-16 ENCOUNTER — Ambulatory Visit (INDEPENDENT_AMBULATORY_CARE_PROVIDER_SITE_OTHER): Payer: Medicare Other

## 2020-10-16 DIAGNOSIS — J4550 Severe persistent asthma, uncomplicated: Secondary | ICD-10-CM | POA: Diagnosis not present

## 2020-10-16 MED ORDER — DUPILUMAB 300 MG/2ML ~~LOC~~ SOSY
300.0000 mg | PREFILLED_SYRINGE | Freq: Once | SUBCUTANEOUS | Status: AC
  Administered 2020-10-16: 300 mg via SUBCUTANEOUS

## 2020-10-16 NOTE — Progress Notes (Signed)
Have you been hospitalized within the last 10 days?  No Do you have a fever?  No Do you have a cough?  No Do you have a headache or sore throat? No Do you have your Epi Pen visible and is it within date?  Yes 

## 2020-10-25 ENCOUNTER — Telehealth: Payer: Self-pay | Admitting: Critical Care Medicine

## 2020-10-25 MED FILL — DUPIXENT 300 MG/2 ML SAFE S: 300 | 28 days supply | Qty: 4 | Fill #4

## 2020-10-25 NOTE — Telephone Encounter (Signed)
Dupixent Shipment Received: 300mg  #2 prefilled syringe Medication arrival date: 10/25/20 Lot #: [id number] Exp date: 04/07/2023 Received by: 04/09/2023

## 2020-10-30 ENCOUNTER — Other Ambulatory Visit: Payer: Self-pay

## 2020-10-30 ENCOUNTER — Ambulatory Visit (INDEPENDENT_AMBULATORY_CARE_PROVIDER_SITE_OTHER): Payer: Medicare Other

## 2020-10-30 DIAGNOSIS — J4550 Severe persistent asthma, uncomplicated: Secondary | ICD-10-CM | POA: Diagnosis not present

## 2020-10-30 MED ORDER — DUPILUMAB 300 MG/2ML ~~LOC~~ SOSY
300.0000 mg | PREFILLED_SYRINGE | Freq: Once | SUBCUTANEOUS | Status: AC
  Administered 2020-10-30: 300 mg via SUBCUTANEOUS

## 2020-10-30 NOTE — Progress Notes (Signed)
Have you been hospitalized within the last 10 days?  No Do you have a fever?  No Do you have a cough?  No Do you have a headache or sore throat? No Do you have your Epi Pen visible and is it within date?  Yes 

## 2020-10-30 NOTE — Progress Notes (Signed)
Cardiology Office Note:    Date:  10/31/2020   ID:  Leslie Sanders, DOB 19-Jan-1976, MRN [id number]  PCP:  Spero Geralds, MD  Cardiologist:  No primary care provider on file.   Referring MD: Lynn Ito, MD   Chief Complaint  Patient presents with  . Palpitations    History of Present Illness:    Leslie Sanders is a 44 y.o. female with a hx of hypertension, chronic diastolic dysfunction, former smoker, anxiety/bipolar disorder, gastric bypass surgery, COPD, palpitations and dizziness. Long term monitor with brief SVT.  She is a patient of Dr.Kardie Tobb.  She has had a complete cardiac work-up which is included all the studies noted below.  No significant abnormalities have been identified.  The long-term monitor demonstrated PAT salvos.  She also had symptomatic premature atrial beats.  She has normal LV size and function.  She has widely patent coronary arteries.  When palpitations occur when she is alone, she becomes frightened and worries that she may die.  Past Medical History:  Diagnosis Date  . Anemia    (a) chronic blood loss hx of menorrhagia s/p endometrial biopsy 04/2015 (b) iron deficient  . Anxiety   . B12 deficiency   . Bipolar 1 disorder (HCC)   . Cervical spinal stenosis   . Cervical spondylosis without myelopathy 07/08/2016  . Chronic pain of right thumb 04/28/2018  . Depression   . GERD (gastroesophageal reflux disease)   . Hemorrhoids    Pt with constipation secondary to iron supplement. Pt has had rectal bleed secondary to internal hemorrhoids. Pt is s/p EGD/colonoscopy 05/2012  . 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 Migraine   . PONV (postoperative nausea and vomiting)   . PTSD (post-traumatic stress disorder)   . Seasonal allergies   . Vitamin E deficiency   . Zinc deficiency     Past Surgical History:  Procedure Laterality Date  . ABDOMINAL HYSTERECTOMY    . BREAST LUMPECTOMY Bilateral   . BREAST REDUCTION SURGERY    .  Carpal Metacarpal Arthroplasty Right 05/13/2019  . CHOLECYSTECTOMY  1990  . HAND SURGERY    . HYSTERECTOMY ABDOMINAL WITH SALPINGECTOMY  11/2017  . POSTERIOR CERVICAL LAMINECTOMY Bilateral 05/23/2019  . RIGHT/LEFT HEART CATH AND CORONARY ANGIOGRAPHY N/A 01/13/2020   Procedure: RIGHT/LEFT HEART CATH AND CORONARY ANGIOGRAPHY;  Surgeon: Lyn Records, MD;  Location: [facility];  Service: Cardiovascular;  Laterality: N/A;  . ROUX-EN-Y GASTRIC BYPASS  2003   in [place]  . SPINAL FUSION  05/23/2019    Current Medications: Current Meds  Medication Sig  . albuterol (PROAIR HFA) 108 (90 Base) MCG/ACT inhaler Inhale 1 puff into the lungs every 6 (six) hours as needed for wheezing or shortness of breath.  Marland Kitchen azelastine (ASTELIN) 0.1 % nasal spray Place 2 sprays into both nostrils 2 (two) times daily. Use in each nostril as directed  . Budeson-Glycopyrrol-Formoterol (BREZTRI AEROSPHERE) 160-9-4.8 MCG/ACT AERO Inhale 2 puffs into the lungs 2 (two) times daily.  . busPIRone (BUSPAR) 5 MG tablet Take 5 mg by mouth 2 (two) times daily.  . chlorhexidine (PERIDEX) 0.12 % solution SMARTSIG:By Mouth  . cyanocobalamin (,VITAMIN B-12,) 1000 MCG/ML injection Inject 1 mL into the muscle every 30 (thirty) days.  Marland Kitchen docusate sodium (COLACE) 100 MG capsule Take 100 mg by mouth daily.  . DULoxetine (CYMBALTA) 60 MG capsule Take 60 mg by mouth daily.   . dupilumab (DUPIXENT) 300 MG/2ML prefilled syringe Inject 300  mg into the skin every 14 (fourteen) days.  . ferrous sulfate 325 (65 FE) MG tablet Take 1 tablet (325 mg total) by mouth 2 (two) times daily with a meal.  . fluticasone (FLONASE) 50 MCG/ACT nasal spray Place 2 sprays into both nostrils daily.  . folic acid (FOLVITE) 1 MG tablet Take 1 mg by mouth daily.   . furosemide (LASIX) 40 MG tablet Take 1 tablet (40 mg total) by mouth daily.  Marland Kitchen glycopyrrolate (ROBINUL) 2 MG tablet Take 1 tablet (2 mg total) by mouth 2 (two) times daily.  . hydrOXYzine  (ATARAX/VISTARIL) 25 MG tablet Take 1 tablet (25 mg total) by mouth every 6 (six) hours as needed for anxiety.  . midodrine (PROAMATINE) 5 MG tablet Take 1 tablet (5 mg total) by mouth 3 (three) times daily with meals.  . montelukast (SINGULAIR) 10 MG tablet Take 10 mg by mouth daily.   Marland Kitchen omeprazole (PRILOSEC) 40 MG capsule Take 1 capsule (40 mg total) by mouth daily.  . pantoprazole (PROTONIX) 40 MG tablet Take 1 tablet (40 mg total) by mouth daily.  . pregabalin (LYRICA) 150 MG capsule Take 150 mg by mouth 2 (two) times daily.  . propranolol (INDERAL) 20 MG tablet TAKE 1 TABLET(20 MG) BY MOUTH THREE TO FOUR TIMES DAILY AS NEEDED  . tolterodine (DETROL LA) 4 MG 24 hr capsule Take 1 capsule by mouth daily.  . Vitamin D, Ergocalciferol, (DRISDOL) 1.25 MG (50000 UT) CAPS capsule Take 50,000 Units by mouth every Monday.      Allergies:   Tylenol [acetaminophen], Adhesive  [tape], Hydrocodone-acetaminophen, Nsaids, and Latex   Social History   Socioeconomic History  . Marital status: Divorced    Spouse name: Not on file  . Number of children: Not on file  . Years of education: Not on file  . Highest education level: Not on file  Occupational History  . Not on file  Tobacco Use  . Smoking status: Former Smoker    Packs/day: 2.00    Years: 23.00    Pack years: 46.00    Types: Cigarettes    Quit date: 12/08/2018    Years since quitting: 1.8  . Smokeless tobacco: Never Used  Vaping Use  . Vaping Use: Never used  Substance and Sexual Activity  . Alcohol use: Not Currently    Alcohol/week: 28.0 standard drinks    Types: 28 Cans of beer per week    Comment: Nothing in 4 years  . Drug use: No  . Sexual activity: Not Currently  Other Topics Concern  . Not on file  Social History Narrative  . Not on file   Social Determinants of Health   Financial Resource Strain:   . Difficulty of Paying Living Expenses: Not on file  Food Insecurity:   . Worried About Programme researcher, broadcasting/film/video in the  Last Year: Not on file  . Ran Out of Food in the Last Year: Not on file  Transportation Needs:   . Lack of Transportation (Medical): Not on file  . Lack of Transportation (Non-Medical): Not on file  Physical Activity:   . Days of Exercise per Week: Not on file  . Minutes of Exercise per Session: Not on file  Stress:   . Feeling of Stress : Not on file  Social Connections:   . Frequency of Communication with Friends and Family: Not on file  . Frequency of Social Gatherings with Friends and Family: Not on file  . Attends Religious Services: Not  on file  . Active Member of Clubs or Organizations: Not on file  . Attends Banker Meetings: Not on file  . Marital Status: Not on file     Family History: The patient's family history includes ADD / ADHD in her son; Arthritis/Rheumatoid in her mother; Breast cancer in her mother; COPD in her maternal grandmother and paternal grandmother; HIV in her father; Lupus in her mother; ODD in her son; Rheum arthritis in her maternal grandmother; Schizophrenia in her maternal aunt.  ROS:   Please see the history of present illness.    She has anxiety.  She notes she is anxious.  Many times she has palpitations even after all the evaluation that she has had, she gets frightened and worries that she will die.  She wanted to be reassured that things were okay.  All other systems reviewed and are negative.  EKGs/Labs/Other Studies Reviewed:    The following studies were reviewed today:  2 D Doppler ECHOCARDIOGRAM 12/22/2019: IMPRESSIONS    1. Left ventricular ejection fraction, by visual estimation, is 60 to  65%. The left ventricle has normal function. There is no left ventricular  hypertrophy.  2. Left ventricular diastolic parameters are consistent with Grade I  diastolic dysfunction (impaired relaxation).  3. The left ventricle has no regional wall motion abnormalities.  4. Normal pulmonary artery systolic pressure.  5. The  inferior vena cava is dilated in size with <50% respiratory  variability, suggesting right atrial pressure of 15 mmHg.    CARDIAC CATH 01/13/2020:  Normal pulmonary artery pressures  Low pulmonary capillary wedge pressure, 6 mmHg.  Right dominant coronary artery anatomy.  Normal coronary arteries without evidence of obstruction or luminal irregularity  Normal left ventricular filling pressures.  Normal left ventricular systolic function.  Normal cardiac output.    Oximetry did not reveal any evidence of left to right shunting  RECOMMENDATIONS:   Hyperpnea with resultant hyperventilation syndrome.  Findings after reviewing all studies suggest psychiatric or central nervous system etiology.  Discontinuation of diuretic therapy would seem appropriate given relatively low filling pressures.   Long Term Monitor 02/29/2020: Conclusion: This study is remarkable for the following:               1.  Asymptomatic paroxysmal supraventricular tachycardia which is likely atrial tachycardia with variable block.                 2.  Rare symptomatic premature atrial complexes.  CAROTID DOPPLER 10.2021: Summary:  Right Carotid: There was no evidence of thrombus, dissection,  atherosclerotic         plaque or stenosis in the cervical carotid system.   Left Carotid: There was no evidence of thrombus, dissection,  atherosclerotic        plaque or stenosis in the cervical carotid system.   Vertebrals: Bilateral vertebral arteries demonstrate antegrade flow.  Subclavians: Normal flow hemodynamics were seen in bilateral subclavian        arteries.   EKG:  EKG not repeated  Recent Labs: 12/20/2019: NT-Pro BNP 60 03/14/2020: Hemoglobin 10.9; Platelets 365.0 09/05/2020: BUN 7; Creatinine, Ser 0.79; Potassium 4.4; Sodium 138  Recent Lipid Panel    Component Value Date/Time   CHOL 173 11/29/2016 0644   TRIG 84 11/29/2016 0644   HDL 88 11/29/2016 0644   CHOLHDL 2.0  11/29/2016 0644   VLDL 17 11/29/2016 0644   LDLCALC 68 11/29/2016 0644    Physical Exam:    VS:  BP 125/72  Pulse 98   Temp 97.9 F (36.6 C)   Ht 5\' 2"  (1.575 m)   Wt 130 lb 9.6 oz (59.2 kg)   LMP 10/07/2016   SpO2 97%   BMI 23.89 kg/m     Wt Readings from Last 3 Encounters:  10/31/20 130 lb 9.6 oz (59.2 kg)  09/05/20 126 lb 12.8 oz (57.5 kg)  08/15/20 133 lb 9.6 oz (60.6 kg)     GEN: Healthy, young and in no acute distress HEENT: Normal NECK: No JVD. LYMPHATICS: No lymphadenopathy CARDIAC:  RRR without murmur, gallop, or edema. VASCULAR:  Normal Pulses. No bruits. RESPIRATORY:  Clear to auscultation without rales, wheezing or rhonchi  ABDOMEN: Soft, non-tender, non-distended, No pulsatile mass, MUSCULOSKELETAL: No deformity  SKIN: Warm and dry NEUROLOGIC:  Alert and oriented x 3 PSYCHIATRIC:  Normal affect   ASSESSMENT:    1. Palpitations   2. Dizziness   3. Essential hypertension   4. Chronic diastolic CHF (congestive heart failure) (HCC)   5. Bipolar disorder, most recent episode depressed (HCC)   6. Chronic obstructive pulmonary disease, unspecified COPD type (HCC)   7. Educated about COVID-19 virus infection    PLAN:    In order of problems listed above:  1. Likely related to PACs.  The benign nature of PACs was discussed in detail.  Also cutting back on sympathomimetic, caffeine, nicotine, and alcohol-containing substances would be helpful.  Physical activity/exercise would also be helpful.  She was reassured concerning her cardiac stability. 2. Not complained of dizziness 3. Blood pressure today is excellent. 4. No clinical evidence of diastolic heart failure 5. Not addressed other than to discussed the relationship between psychological status and palpitations. 6. Discussed the impact that sympathomimetics can have on heart rhythm. 7. Vaccinated and practicing social distancing.  Exercises recommended.  Avoidance of sympathomimetics and other agents  that can cause premature beats is discussed.  Would be happy to see as needed but as needed and not routine follow-up is appropriate.    Medication Adjustments/Labs and Tests Ordered: Current medicines are reviewed at length with the patient today.  Concerns regarding medicines are outlined above.  No orders of the defined types were placed in this encounter.  No orders of the defined types were placed in this encounter.   Patient Instructions  Medication Instructions:  Your physician recommends that you continue on your current medications as directed. Please refer to the Current Medication list given to you today.  *If you need a refill on your cardiac medications before your next appointment, please call your pharmacy*   Lab Work: None If you have labs (blood work) drawn today and your tests are completely normal, you will receive your results only by: 10/15/20 MyChart Message (if you have MyChart) OR . A paper copy in the mail If you have any lab test that is abnormal or we need to change your treatment, we will call you to review the results.   Testing/Procedures: None   Follow-Up: At [facility], you and your health needs are our priority.  As part of our continuing mission to provide you with exceptional heart care, we have created designated Provider Care Teams.  These Care Teams include your primary Cardiologist (physician) and Advanced Practice Providers (APPs -  Physician Assistants and Nurse Practitioners) who all work together to provide you with the care you need, when you need it.  We recommend signing up for the patient portal called "MyChart".  Sign up information is provided on this  After Visit Summary.  MyChart is used to connect with patients for Virtual Visits (Telemedicine).  Patients are able to view lab/test results, encounter notes, upcoming appointments, etc.  Non-urgent messages can be sent to your provider as well.   To learn more about what you can do with  MyChart, go to [email or web address].    Your next appointment:   As needed  The format for your next appointment:   In Person  Provider:   You may see Dr. Verdis PrimeHenry Cosme Jacob or one of the following Advanced Practice Providers on your designated Care Team:    Norma FredricksonLori Gerhardt, NP  Nada BoozerLaura Ingold, NP  Georgie ChardJill McDaniel, NP    Other Instructions      Signed, Lesleigh NoeHenry W Bayden Gil III, MD  10/31/2020 3:28 PM    [facility]

## 2020-10-31 ENCOUNTER — Ambulatory Visit (INDEPENDENT_AMBULATORY_CARE_PROVIDER_SITE_OTHER): Payer: Medicare Other | Admitting: Interventional Cardiology

## 2020-10-31 ENCOUNTER — Encounter: Payer: Self-pay | Admitting: Interventional Cardiology

## 2020-10-31 VITALS — BP 125/72 | HR 98 | Temp 97.9°F | Ht 62.0 in | Wt 130.6 lb

## 2020-10-31 DIAGNOSIS — I10 Essential (primary) hypertension: Secondary | ICD-10-CM | POA: Diagnosis not present

## 2020-10-31 DIAGNOSIS — R002 Palpitations: Secondary | ICD-10-CM

## 2020-10-31 DIAGNOSIS — R42 Dizziness and giddiness: Secondary | ICD-10-CM

## 2020-10-31 DIAGNOSIS — F3130 Bipolar disorder, current episode depressed, mild or moderate severity, unspecified: Secondary | ICD-10-CM

## 2020-10-31 DIAGNOSIS — I5032 Chronic diastolic (congestive) heart failure: Secondary | ICD-10-CM | POA: Diagnosis not present

## 2020-10-31 DIAGNOSIS — J449 Chronic obstructive pulmonary disease, unspecified: Secondary | ICD-10-CM

## 2020-10-31 DIAGNOSIS — Z7189 Other specified counseling: Secondary | ICD-10-CM

## 2020-10-31 NOTE — Patient Instructions (Signed)

## 2020-11-15 ENCOUNTER — Ambulatory Visit (INDEPENDENT_AMBULATORY_CARE_PROVIDER_SITE_OTHER): Payer: Medicare Other

## 2020-11-15 ENCOUNTER — Other Ambulatory Visit: Payer: Self-pay

## 2020-11-15 ENCOUNTER — Ambulatory Visit: Payer: Medicare Other

## 2020-11-15 DIAGNOSIS — J4550 Severe persistent asthma, uncomplicated: Secondary | ICD-10-CM

## 2020-11-15 MED ORDER — DUPILUMAB 300 MG/2ML ~~LOC~~ SOSY
300.0000 mg | PREFILLED_SYRINGE | Freq: Once | SUBCUTANEOUS | Status: AC
  Administered 2020-11-15: 300 mg via SUBCUTANEOUS

## 2020-11-15 NOTE — Progress Notes (Signed)
Have you been hospitalized within the last 10 days?  No Do you have a fever?  No Do you have a cough?  No Do you have a headache or sore throat? No Do you have your Epi Pen visible and is it within date?  Yes 

## 2020-11-27 ENCOUNTER — Telehealth: Payer: Self-pay | Admitting: Critical Care Medicine

## 2020-11-27 MED FILL — DUPIXENT 300 MG/2 ML SAFE S: 300 | 28 days supply | Qty: 4 | Fill #5

## 2020-11-27 NOTE — Telephone Encounter (Signed)
Dupixent Shipment Received: 300mg  #2 prefilled syringe Medication arrival date: 11/27/20 Lot #: [id number] Exp date: 04/07/2023 Received by: 04/09/2023

## 2020-12-03 ENCOUNTER — Ambulatory Visit: Payer: Medicare Other

## 2020-12-05 ENCOUNTER — Ambulatory Visit (INDEPENDENT_AMBULATORY_CARE_PROVIDER_SITE_OTHER): Payer: Medicare Other

## 2020-12-05 ENCOUNTER — Other Ambulatory Visit: Payer: Self-pay

## 2020-12-05 DIAGNOSIS — J4550 Severe persistent asthma, uncomplicated: Secondary | ICD-10-CM

## 2020-12-05 MED ORDER — DUPILUMAB 300 MG/2ML ~~LOC~~ SOSY
300.0000 mg | PREFILLED_SYRINGE | Freq: Once | SUBCUTANEOUS | Status: AC
  Administered 2020-12-05: 15:00:00 300 mg via SUBCUTANEOUS

## 2020-12-05 NOTE — Progress Notes (Signed)
Have you been hospitalized within the last 10 days?  No Do you have a fever?  No Do you have a cough?  No Do you have a headache or sore throat? No Do you have your Epi Pen visible and is it within date?  Yes 

## 2020-12-18 ENCOUNTER — Other Ambulatory Visit: Payer: Self-pay | Admitting: Critical Care Medicine

## 2020-12-19 ENCOUNTER — Telehealth: Payer: Self-pay | Admitting: *Deleted

## 2020-12-19 ENCOUNTER — Ambulatory Visit: Payer: Medicare Other

## 2020-12-19 ENCOUNTER — Other Ambulatory Visit: Payer: Self-pay | Admitting: Critical Care Medicine

## 2020-12-19 NOTE — Telephone Encounter (Signed)
Called and left a detailed VM, according to DPR, needs an OV with Dr. Celine Mans in a 30 minute slot as a new patient.  First available is 01/04/21 at 9 am.  Requested a return call.  Will await patient call.

## 2020-12-25 ENCOUNTER — Other Ambulatory Visit: Payer: Self-pay | Admitting: Physician Assistant

## 2021-01-07 ENCOUNTER — Telehealth: Payer: Self-pay | Admitting: Critical Care Medicine

## 2021-01-08 ENCOUNTER — Other Ambulatory Visit: Payer: Self-pay | Admitting: Critical Care Medicine

## 2021-01-08 NOTE — Telephone Encounter (Signed)
Patient called office and rescheduled dupixent injection for 01/11/21 at 1345.

## 2021-01-11 ENCOUNTER — Ambulatory Visit (INDEPENDENT_AMBULATORY_CARE_PROVIDER_SITE_OTHER): Payer: Medicare HMO

## 2021-01-11 ENCOUNTER — Other Ambulatory Visit: Payer: Self-pay

## 2021-01-11 DIAGNOSIS — J4550 Severe persistent asthma, uncomplicated: Secondary | ICD-10-CM | POA: Diagnosis not present

## 2021-01-11 MED ORDER — DUPILUMAB 300 MG/2ML ~~LOC~~ SOSY
300.0000 mg | PREFILLED_SYRINGE | Freq: Once | SUBCUTANEOUS | Status: AC
  Administered 2021-01-11: 300 mg via SUBCUTANEOUS

## 2021-01-11 NOTE — Progress Notes (Signed)
Have you been hospitalized within the last 10 days?  No Do you have a fever?  No Do you have a cough?  No Do you have a headache or sore throat? No Do you have your Epi Pen visible and is it within date?  Yes 

## 2021-01-21 MED FILL — DUPIXENT 300 MG/2 ML SAFE S: 300 | 28 days supply | Qty: 4 | Fill #0

## 2021-01-22 ENCOUNTER — Telehealth: Payer: Self-pay | Admitting: Critical Care Medicine

## 2021-01-22 NOTE — Telephone Encounter (Signed)
Dupixent Shipment Received: 300mg  #2 prefilled syringe Medication arrival date: 01/22/21 Lot #: [id number] Exp date: 05/08/2023 Received by: 05/10/2023

## 2021-01-24 ENCOUNTER — Ambulatory Visit: Payer: Medicare HMO | Admitting: Internal Medicine

## 2021-01-25 ENCOUNTER — Ambulatory Visit: Payer: Medicare HMO

## 2021-01-25 ENCOUNTER — Encounter (HOSPITAL_COMMUNITY): Payer: Self-pay | Admitting: *Deleted

## 2021-01-25 ENCOUNTER — Other Ambulatory Visit: Payer: Self-pay

## 2021-01-25 ENCOUNTER — Emergency Department (HOSPITAL_COMMUNITY)
Admission: EM | Admit: 2021-01-25 | Discharge: 2021-01-30 | Disposition: A | Discharge status: Home / Self Care | Payer: Medicare HMO | Attending: Emergency Medicine | Admitting: Emergency Medicine

## 2021-01-25 ENCOUNTER — Emergency Department (HOSPITAL_COMMUNITY): Payer: Medicare HMO

## 2021-01-25 DIAGNOSIS — J4551 Severe persistent asthma with (acute) exacerbation: Secondary | ICD-10-CM | POA: Insufficient documentation

## 2021-01-25 DIAGNOSIS — F332 Major depressive disorder, recurrent severe without psychotic features: Secondary | ICD-10-CM | POA: Diagnosis present

## 2021-01-25 DIAGNOSIS — F1020 Alcohol dependence, uncomplicated: Secondary | ICD-10-CM | POA: Diagnosis not present

## 2021-01-25 DIAGNOSIS — F1010 Alcohol abuse, uncomplicated: Secondary | ICD-10-CM | POA: Insufficient documentation

## 2021-01-25 DIAGNOSIS — Z7951 Long term (current) use of inhaled steroids: Secondary | ICD-10-CM | POA: Insufficient documentation

## 2021-01-25 DIAGNOSIS — Z20822 Contact with and (suspected) exposure to covid-19: Secondary | ICD-10-CM | POA: Diagnosis not present

## 2021-01-25 DIAGNOSIS — R45851 Suicidal ideations: Secondary | ICD-10-CM | POA: Insufficient documentation

## 2021-01-25 DIAGNOSIS — Y908 Blood alcohol level of 240 mg/100 ml or more: Secondary | ICD-10-CM | POA: Diagnosis not present

## 2021-01-25 DIAGNOSIS — F1290 Cannabis use, unspecified, uncomplicated: Secondary | ICD-10-CM | POA: Diagnosis not present

## 2021-01-25 DIAGNOSIS — Z87891 Personal history of nicotine dependence: Secondary | ICD-10-CM | POA: Insufficient documentation

## 2021-01-25 DIAGNOSIS — Z91040 Latex allergy status: Secondary | ICD-10-CM | POA: Insufficient documentation

## 2021-01-25 DIAGNOSIS — Z046 Encounter for general psychiatric examination, requested by authority: Secondary | ICD-10-CM | POA: Diagnosis present

## 2021-01-25 LAB — COMPREHENSIVE METABOLIC PANEL
ALT: 17 U/L (ref 0–44)
AST: 22 U/L (ref 15–41)
Albumin: 4 g/dL (ref 3.5–5.0)
Alkaline Phosphatase: 45 U/L (ref 38–126)
Anion gap: 12 (ref 5–15)
BUN: 7 mg/dL (ref 6–20)
CO2: 24 mmol/L (ref 22–32)
Calcium: 8.3 mg/dL — ABNORMAL LOW (ref 8.9–10.3)
Chloride: 108 mmol/L (ref 98–111)
Creatinine, Ser: 0.52 mg/dL (ref 0.44–1.00)
GFR, Estimated: >60 mL/min (ref >60)
Glucose, Bld: 90 mg/dL (ref 70–99)
Potassium: 3.7 mmol/L (ref 3.5–5.1)
Sodium: 144 mmol/L (ref 135–145)
Total Bilirubin: 0.4 mg/dL (ref 0.3–1.2)
Total Protein: 7.2 g/dL (ref 6.5–8.1)

## 2021-01-25 LAB — CBC WITH DIFFERENTIAL/PLATELET
Abs Immature Granulocytes: 0.01 10*3/uL (ref 0.00–0.07)
Basophils Absolute: 0 10*3/uL (ref 0.0–0.1)
Basophils Relative: 1 %
Eosinophils Absolute: 0.1 10*3/uL (ref 0.0–0.5)
Eosinophils Relative: 2 %
HCT: 37.8 % (ref 36.0–46.0)
Hemoglobin: 12 g/dL (ref 12.0–15.0)
Immature Granulocytes: 0 %
Lymphocytes Relative: 45 %
Lymphs Abs: 2.5 10*3/uL (ref 0.7–4.0)
MCH: 28.1 pg (ref 26.0–34.0)
MCHC: 31.7 g/dL (ref 30.0–36.0)
MCV: 88.5 fL (ref 80.0–100.0)
Monocytes Absolute: 0.4 10*3/uL (ref 0.1–1.0)
Monocytes Relative: 6 %
Neutro Abs: 2.6 10*3/uL (ref 1.7–7.7)
Neutrophils Relative %: 46 %
Platelets: 412 10*3/uL — ABNORMAL HIGH (ref 150–400)
RBC: 4.27 MIL/uL (ref 3.87–5.11)
RDW: 15.7 % — ABNORMAL HIGH (ref 11.5–15.5)
WBC: 5.5 10*3/uL (ref 4.0–10.5)
nRBC: 0 % (ref 0.0–0.2)

## 2021-01-25 LAB — I-STAT BETA HCG BLOOD, ED (MC, WL, AP ONLY): I-stat hCG, quantitative: <5 m[IU]/mL (ref <5)

## 2021-01-25 LAB — SALICYLATE LEVEL: Salicylate Lvl: <7 mg/dL — ABNORMAL LOW (ref 7.0–30.0)

## 2021-01-25 LAB — ETHANOL: Alcohol, Ethyl (B): 259 mg/dL — ABNORMAL HIGH (ref <10)

## 2021-01-25 LAB — RAPID URINE DRUG SCREEN, HOSP PERFORMED
Amphetamines: NOT DETECTED
Barbiturates: NOT DETECTED
Benzodiazepines: NOT DETECTED
Cocaine: NOT DETECTED
Opiates: NOT DETECTED
Tetrahydrocannabinol: NOT DETECTED

## 2021-01-25 LAB — TROPONIN I (HIGH SENSITIVITY): Troponin I (High Sensitivity): <2 ng/L (ref <18)

## 2021-01-25 LAB — ACETAMINOPHEN LEVEL: Acetaminophen (Tylenol), Serum: <10 ug/mL — ABNORMAL LOW (ref 10–30)

## 2021-01-25 MED ORDER — ALUM & MAG HYDROXIDE-SIMETH 200-200-20 MG/5ML PO SUSP
30.0000 mL | Freq: Once | ORAL | Status: AC
  Administered 2021-01-25: 30 mL via ORAL
  Filled 2021-01-25: qty 30

## 2021-01-25 MED ORDER — METOCLOPRAMIDE HCL 10 MG PO TABS
5.0000 mg | ORAL_TABLET | Freq: Once | ORAL | Status: AC
  Administered 2021-01-25: 5 mg via ORAL
  Filled 2021-01-25: qty 1

## 2021-01-25 MED ORDER — LIDOCAINE VISCOUS HCL 2 % MT SOLN
15.0000 mL | Freq: Once | OROMUCOSAL | Status: AC
  Administered 2021-01-25: 15 mL via ORAL
  Filled 2021-01-25: qty 15

## 2021-01-25 NOTE — BH Assessment (Signed)
Comprehensive Clinical Assessment (CCA) Note  01/25/2021 Leslie Sanders [id number] Patient presents this date voluntary after meeting with her OP therapist at [facility] in [facility] who referred her to [facility] after patient voiced S/I earlier this date. Patient continues to report ongoing S/I at the time of assessment with a plan to over dose on her medical medications. Patient reports one prior attempt at self harm in 2017 when she presented to [facility] with S/I. Patient met inpatient criteria at that time. Patient states this date that she receives her mental health medications for depression from her current provider, [facility] in [facility] although states she has not taken them in over three months due to self medicating with alcohol. Patient states she currently resides alone and recently had multiple back surgeries which has resulted in chronic pain. Patient states she "just can't take it anymore" and wants to end her life. Patient denies any H/I or AVH. Patient reports ongoing alcohol use stating she consumes varies amounts three to four times a week with last use earlier this date when she reported she "had a few beers." Patient also reports sporadic Cannabis use although is vague in reference to time frame and amounts used when rendering her history on both of those substances. Patient's BAL this date was  67 on arrival. Patient's UDS was negative. Patient is observed to be guarded and renders limited history this date. Patient is observed to be crying throughout the assessment and speaks in a low soft voice that is difficult to understand.   Per EDP notes this date on arrival:    45 year old female with a history of anemia, B12 deficiency, bipolar 1 disorder, depression, gastric bypass presents to the ER for suicidal ideation. Patient is tearful on arrival to the ER, states that she does not want to live anymore. States that she is in constant pain and that her pain medicines and the surgeries that she has had in the  past have not helped. She does have chronic opioids which she states that she plans on taking to overdose as she states that she wants to die but does not want to die "in pain". She denies any homicidal ideations, no visual or auditory hallucinations but states that she has very vivid dreams and several weeks ago had been clawing at her arms and her sleep due to these dreams. She states that she uses marijuana, normally drinks wine. Last alcoholic beverage was yesterday, states that she drank a bottle of red wine. She has no physical complaints at this time  Patient is alert and oriented x4. Patient is dressed in scrubs and is crying uncontrollably at times throughout the assessment. Patient states that she is depressed and endorses symptoms of  tearfulness, isolation, fatigue and  Anhedonia. Patient speaks in a low soft voice that is difficult to understand at times. Patient's memory appears to be intact and thoughts organized. Patient's mood is depressed and affect congruent. Patient does not appear to be responding to internal stimuli. Hall Busing NP recommends a inpatient admission as appropriate bed placement is investigated.   Chief Complaint:  Chief Complaint  Patient presents with  . Medical Clearance   Visit Diagnosis: MDD recurrent without psychotic features, severe, Alcohol abuse, Cannabis use     CCA Screening, Triage and Referral (STR)  Patient Reported Information How did you hear about [place]? Self  Referral name: No data recorded Referral phone number: No data recorded  Whom do you see for routine medical problems? I don't have  a doctor  Practice/Facility Name: No data recorded Practice/Facility Phone Number: No data recorded Name of Contact: No data recorded Contact Number: No data recorded Contact Fax Number: No data recorded Prescriber Name: No data recorded Prescriber Address (if known): No data recorded  What Is the Reason for Your Visit/Call Today? Ongoing S/I  How Long Has  This Been Causing You Problems? 1 wk - 1 month  What Do You Feel Would Help You the Most Today? Other (Comment) (Inpatient)   Have You Recently Been in Any Inpatient Treatment (Hospital/Detox/Crisis Center/28-Day Program)? No  Name/Location of Program/Hospital:No data recorded How Long Were You There? No data recorded When Were You Discharged? No data recorded  Have You Ever Received Services From [facility] Before? No  Who Do You See at [facility]? No data recorded  Have You Recently Had Any Thoughts About Hurting Yourself? Yes  Are You Planning to Commit Suicide/Harm Yourself At This time? Yes   Have you Recently Had Thoughts About Hurting Someone [place]? No data recorded Explanation: No data recorded  Have You Used Any Alcohol or Drugs in the Past 24 Hours? No  How Long Ago Did You Use Drugs or Alcohol? No data recorded What Did You Use and How Much? No data recorded  Do You Currently Have a Therapist/Psychiatrist? No  Name of Therapist/Psychiatrist: No data recorded  Have You Been Recently Discharged From Any Office Practice or Programs? No  Explanation of Discharge From Practice/Program: No data recorded    CCA Screening Triage Referral Assessment Type of Contact: Face-to-Face  Is this Initial or Reassessment? No data recorded Date Telepsych consult ordered in [facility]:  No data recorded Time Telepsych consult ordered in [facility]:  No data recorded  Patient Reported Information Reviewed? Yes  Patient Left Without Being Seen? No data recorded Reason for Not Completing Assessment: No data recorded  Collateral Involvement: No data recorded  Does Patient Have a [place]? No data recorded Name and Contact of Legal Guardian: No data recorded If Minor and Not Living with Parent(s), Who has Custody? No data recorded Is CPS involved or ever been involved? Never  Is APS involved or ever been involved? Never   Patient Determined To Be At Risk for Harm  To Self or Others Based on Review of Patient Reported Information or Presenting Complaint? Yes, for Self-Harm  Method: No data recorded Availability of Means: No data recorded Intent: No data recorded Notification Required: No data recorded Additional Information for Danger to Others Potential: No data recorded Additional Comments for Danger to Others Potential: No data recorded Are There Guns or Other Weapons in Your Home? No data recorded Types of Guns/Weapons: No data recorded Are These Weapons Safely Secured?                            No data recorded Who Could Verify You Are Able To Have These Secured: No data recorded Do You Have any Outstanding Charges, Pending Court Dates, Parole/Probation? No data recorded Contacted To Inform of Risk of Harm To Self or Others: Other: Comment (NA)   Location of Assessment: [facility] ED   Does Patient Present under Involuntary Commitment? No  IVC Papers Initial File Date: No data recorded  [place] of Residence: [place]   Patient Currently Receiving the Following Services: Medication Management   Determination of Need: -- (Inpatient)   Options For Referral: Outpatient Therapy     CCA Biopsychosocial Intake/Chief Complaint:  Ongoing S/I  Current Symptoms/Problems: No data recorded  Patient Reported Schizophrenia/Schizoaffective Diagnosis in Past: No   Strengths: No data recorded Preferences: No data recorded Abilities: No data recorded  Type of Services Patient Feels are Needed: No data recorded  Initial Clinical Notes/Concerns: No data recorded  Mental Health Symptoms Depression:  Change in energy/activity   Duration of Depressive symptoms: Less than two weeks   Mania:  None   Anxiety:   Difficulty concentrating   Psychosis:  None   Duration of Psychotic symptoms: No data recorded  Trauma:  None   Obsessions:  None   Compulsions:  No data recorded  Inattention:  None   Hyperactivity/Impulsivity:  N/A    Oppositional/Defiant Behaviors:  None   Emotional Irregularity:  None   Other Mood/Personality Symptoms:  No data recorded   Mental Status Exam Appearance and self-care  Stature:  Average   Weight:  Average weight   Clothing:  Neat/clean   Grooming:  Normal   Cosmetic use:  None   Posture/gait:  Normal   Motor activity:  Not Remarkable   Sensorium  Attention:  Normal   Concentration:  Normal   Orientation:  X5   Recall/memory:  Normal   Affect and Mood  Affect:  Anxious; Depressed   Mood:  Depressed   Relating  Eye contact:  Normal   Facial expression:  Depressed   Attitude toward examiner:  Cooperative   Thought and Language  Speech flow: Clear and Coherent   Thought content:  Appropriate to Mood and Circumstances   Preoccupation:  None   Hallucinations:  None   Organization:  No data recorded  [employer] of Knowledge:  Fair   Intelligence:  Average   Abstraction:  Normal   Judgement:  Normal   Reality Testing:  Realistic   Insight:  Fair   Decision Making:  Normal   Social Functioning  Social Maturity:  Responsible   Social Judgement:  Normal   Stress  Stressors:  Illness   Coping Ability:  Advice worker Deficits:  Decision making   Supports:  Usual     Religion: Religion/Spirituality Are You A Religious Person?: No  Leisure/Recreation: Leisure / Recreation Do You Have Hobbies?: No  Exercise/Diet: Exercise/Diet Do You Exercise?: No Have You Gained or Lost A Significant Amount of Weight in the Past Six Months?: No Do You Follow a Special Diet?: No Do You Have Any Trouble Sleeping?: No   CCA Employment/Education Employment/Work Situation: Employment / Work Situation Employment situation: Unemployed  Education:     CCA Family/Childhood History Family and Relationship History: Family history Marital status: Single  Childhood History:  Childhood History Did patient suffer any  verbal/emotional/physical/sexual abuse as a child?: No Did patient suffer from severe childhood neglect?: No Has patient ever been sexually abused/assaulted/raped as an adolescent or adult?: No Was the patient ever a victim of a crime or a disaster?: No Witnessed domestic violence?: No Has patient been affected by domestic violence as an adult?: No  Child/Adolescent Assessment:     CCA Substance Use Alcohol/Drug Use: Alcohol / Drug Use Pain Medications: See MAR Prescriptions: See MAR Over the Counter: See MAR History of alcohol / drug use?: Yes Longest period of sobriety (when/how long): Unknown Negative Consequences of Use:  (NA) Withdrawal Symptoms: Other (Comment) (None) Substance #1 Name of Substance 1: Alcohol 1 - Age of First Use: 16 1 - Amount (size/oz): Varies 1 - Frequency: Variesd 1 - Duration: Ongoing 1 -  Last Use / Amount: 01/22/21 "a few beers" Substance #2 Name of Substance 2: Cannabis 2 - Age of First Use: 14 2 - Amount (size/oz): Varies 2 - Frequency: Varies 2 - Duration: Ongoing 2 - Last Use / Amount: 01/22/21 "a blunt"                     ASAM's:  Six Dimensions of Multidimensional Assessment  Dimension 1:  Acute Intoxication and/or Withdrawal Potential:   Dimension 1:  Description of individual's past and current experiences of substance use and withdrawal: 1  Dimension 2:  Biomedical Conditions and Complications:   Dimension 2:  Description of patient's biomedical conditions and  complications: 2  Dimension 3:  Emotional, Behavioral, or Cognitive Conditions and Complications:  Dimension 3:  Description of emotional, behavioral, or cognitive conditions and complications: 2  Dimension 4:  Readiness to Change:  Dimension 4:  Description of Readiness to Change criteria: 2  Dimension 5:  Relapse, Continued use, or Continued Problem Potential:  Dimension 5:  Relapse, continued use, or continued problem potential critiera description: 1  Dimension 6:   Recovery/Living Environment:  Dimension 6:  Recovery/Iiving environment criteria description: 2  ASAM Severity Score: ASAM's Severity Rating Score: 10  ASAM Recommended Level of Treatment:     Substance use Disorder (SUD) Substance Use Disorder (SUD)  Checklist Symptoms of Substance Use: Continued use despite having a persistent/recurrent physical/psychological problem caused/exacerbated by use  Recommendations for Services/Supports/Treatments: Recommendations for Services/Supports/Treatments Recommendations For Services/Supports/Treatments: Peer Support  DSM5 Diagnoses: Patient Active Problem List   Diagnosis Date Noted  . Allergic rhinitis 05/28/2020  . Severe persistent allergic asthma 05/28/2020  . Malabsorption syndrome 01/30/2020  . Migraine 01/30/2020  . Obesity hypoventilation syndrome ([place]) 01/18/2020  . S/P gastric bypass 01/18/2020  . HSV-1 (herpes simplex virus 1) infection 09/29/2019  . Atypical chest pain 08/02/2019  . History of smoking 08/02/2019  . Dyspnea on exertion 08/02/2019  . Abnormal EKG 07/19/2019  . Fibromyalgia 03/24/2019  . Panic disorder 12/14/2017  . Major depression, recurrent ([place]) 11/27/2016  . Bipolar disorder, most recent episode depressed ([place]) 10/12/2016  . Anemia, iron deficiency 10/12/2016  . Alcohol use disorder, moderate, dependence ([place]) 10/12/2016  . Lactose intolerance 10/12/2016  . Major depressive disorder, recurrent, moderate ([place]) 07/12/2015  . Anxiety 04/16/2015  . Cervical spinal stenosis 04/16/2015  . Sickle cell trait ([place]) 04/16/2015  . Anemia 04/19/2014    Patient Centered Plan: Patient is on the following Treatment Plan(s):    Referrals to Alternative Service(s): Referred to Alternative Service(s):   Place:   Date:   Time:    Referred to Alternative Service(s):   Place:   Date:   Time:    Referred to Alternative Service(s):   Place:   Date:   Time:    Referred to Alternative Service(s):   Place:   Date:   Time:      Mamie Nick, LCAS

## 2021-01-25 NOTE — ED Notes (Signed)
Patient complaining of "abnormal feeling" in her chest.  States she has felt this way in the past when she had "heart" problems.  EDP notified.

## 2021-01-25 NOTE — ED Provider Notes (Signed)
[facility]-EMERGENCY DEPT Provider Note   CSN: [id number] Arrival date & time: 01/25/21  1240     History Chief Complaint  Patient presents with  . Medical Clearance    Leslie Sanders is a 45 y.o. female.  HPI 45 year old female with a history of anemia, B12 deficiency, bipolar 1 disorder, depression, PTSD, gastric bypass presents to the ER for suicidal ideation. Patient is tearful on arrival to the ER, states that she does not want to live anymore. States that she is in constant pain and that her pain medicines and the surgeries that she has had in the past have not helped. She does have chronic opioids which she states that she plans on taking to overdose as she states that she wants to die but does not want to die "in pain". She denies any homicidal ideations, no visual or auditory hallucinations but states that she has very vivid dreams and several weeks ago had been clawing at her arms and her sleep due to these dreams. She states that she uses marijuana, normally drinks wine. Last alcoholic beverage was yesterday, states that she drank a bottle of red wine. She has no physical complaints at this time    Past Medical History:  Diagnosis Date  . Anemia    (a) chronic blood loss hx of menorrhagia s/p endometrial biopsy 04/2015 (b) iron deficient  . Anxiety   . B12 deficiency   . Bipolar 1 disorder (HCC)   . Cervical spinal stenosis   . Cervical spondylosis without myelopathy 07/08/2016  . Chronic pain of right thumb 04/28/2018  . Depression   . GERD (gastroesophageal reflux disease)   . Hemorrhoids    Pt with constipation secondary to iron supplement. Pt has had rectal bleed secondary to internal hemorrhoids. Pt is s/p EGD/colonoscopy 05/2012  . 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 Migraine   . PONV (postoperative nausea and vomiting)   . PTSD (post-traumatic stress disorder)   . Seasonal allergies   . Vitamin E deficiency   . Zinc deficiency      Patient Active Problem List   Diagnosis Date Noted  . Allergic rhinitis 05/28/2020  . Severe persistent allergic asthma 05/28/2020  . Malabsorption syndrome 01/30/2020  . Migraine 01/30/2020  . Obesity hypoventilation syndrome (HCC) 01/18/2020  . S/P gastric bypass 01/18/2020  . HSV-1 (herpes simplex virus 1) infection 09/29/2019  . Atypical chest pain 08/02/2019  . History of smoking 08/02/2019  . Dyspnea on exertion 08/02/2019  . Abnormal EKG 07/19/2019  . Fibromyalgia 03/24/2019  . Panic disorder 12/14/2017  . Major depression, recurrent (HCC) 11/27/2016  . Bipolar disorder, most recent episode depressed (HCC) 10/12/2016  . Anemia, iron deficiency 10/12/2016  . Alcohol use disorder, moderate, dependence (HCC) 10/12/2016  . Lactose intolerance 10/12/2016  . Major depressive disorder, recurrent, moderate (HCC) 07/12/2015  . Anxiety 04/16/2015  . Cervical spinal stenosis 04/16/2015  . Sickle cell trait (HCC) 04/16/2015  . Anemia 04/19/2014    Past Surgical History:  Procedure Laterality Date  . ABDOMINAL HYSTERECTOMY    . BREAST LUMPECTOMY Bilateral   . BREAST REDUCTION SURGERY    . Carpal Metacarpal Arthroplasty Right 05/13/2019  . CHOLECYSTECTOMY  1990  . HAND SURGERY    . HYSTERECTOMY ABDOMINAL WITH SALPINGECTOMY  11/2017  . POSTERIOR CERVICAL LAMINECTOMY Bilateral 05/23/2019  . RIGHT/LEFT HEART CATH AND CORONARY ANGIOGRAPHY N/A 01/13/2020   Procedure: RIGHT/LEFT HEART CATH AND CORONARY ANGIOGRAPHY;  Surgeon: Lyn Records, MD;  Location: [facility]AS[facility];  Service: Cardiovascular;  Laterality: N/A;  . ROUX-EN-Y GASTRIC BYPASS  2003   in [place]  . SPINAL FUSION  05/23/2019     OB History   No obstetric history on file.     Family History  Problem Relation Age of Onset  . Schizophrenia Maternal Aunt   . ADD / ADHD Son   . ODD Son   . Breast cancer Mother   . Arthritis/Rheumatoid Mother   . Lupus Mother   . HIV Father   . Rheum arthritis Maternal  Grandmother   . COPD Maternal Grandmother   . COPD Paternal Grandmother     Social History   Tobacco Use  . Smoking status: Former Smoker    Packs/day: 2.00    Years: 23.00    Pack years: 46.00    Types: Cigarettes    Quit date: 12/08/2018    Years since quitting: 2.1  . Smokeless tobacco: Never Used  Vaping Use  . Vaping Use: Never used  Substance Use Topics  . Alcohol use: Not Currently    Alcohol/week: 28.0 standard drinks    Types: 28 Cans of beer per week    Comment: Nothing in 4 years  . Drug use: No    Home Medications Prior to Admission medications   Medication Sig Start Date End Date Taking? Authorizing Provider  albuterol (PROVENTIL) (2.5 MG/3ML) 0.083% nebulizer solution Take 3 mLs (2.5 mg total) by nebulization every 6 (six) hours as needed for wheezing or shortness of breath (Inhale 9ml every 6 hours and as needed.). 08/15/20 09/14/20  Karie Fetch P, DO  albuterol (VENTOLIN HFA) 108 (90 Base) MCG/ACT inhaler INHALE 1 PUFF INTO THE LUNGS EVERY 6 HOURS AS NEEDED FOR WHEEZING OR SHORTNESS OF BREATH 01/09/21   Parrett, Virgel Bouquet, NP  azelastine (ASTELIN) 0.1 % nasal spray Place 2 sprays into both nostrils 2 (two) times daily. Use in each nostril as directed 08/15/20   Steffanie Dunn, DO  Budeson-Glycopyrrol-Formoterol (BREZTRI AEROSPHERE) 160-9-4.8 MCG/ACT AERO Inhale 2 puffs into the lungs 2 (two) times daily. 03/14/20   Steffanie Dunn, DO  busPIRone (BUSPAR) 5 MG tablet Take 5 mg by mouth 2 (two) times daily. 12/30/19   [provider]  chlorhexidine (PERIDEX) 0.12 % solution SMARTSIG:By Mouth 05/17/20   [provider]  cyanocobalamin (,VITAMIN B-12,) 1000 MCG/ML injection Inject 1 mL into the muscle every 30 (thirty) days.    [provider]  docusate sodium (COLACE) 100 MG capsule Take 100 mg by mouth daily.    [provider]  DULoxetine (CYMBALTA) 60 MG capsule Take 60 mg by mouth daily.  03/10/19   [provider]  DUPIXENT 300  MG/2ML prefilled syringe INJECT 300 MG INTO THE SKIN EVERY 14 (FOURTEEN) DAYS. 12/19/20   Karie Fetch P, DO  ferrous sulfate 325 (65 FE) MG tablet Take 1 tablet (325 mg total) by mouth 2 (two) times daily with a meal. 12/15/16   Oneta Rack, NP  fluticasone (FLONASE) 50 MCG/ACT nasal spray Place 2 sprays into both nostrils daily. 12/12/19   Steffanie Dunn, DO  folic acid (FOLVITE) 1 MG tablet Take 1 mg by mouth daily.  04/07/19   [provider]  furosemide (LASIX) 40 MG tablet Take 1 tablet (40 mg total) by mouth daily. 06/14/20   Rosalio Macadamia, NP  glycopyrrolate (ROBINUL) 2 MG tablet Take 1 tablet (2 mg total) by mouth 2 (two) times daily. 01/18/20  Esterwood, Amy S, PA-C  hydrOXYzine (ATARAX/VISTARIL) 25 MG tablet Take 1 tablet (25 mg total) by mouth every 6 (six) hours as needed for anxiety. 12/22/19   Long, Arlyss RepressJoshua G, MD  midodrine (PROAMATINE) 5 MG tablet Take 1 tablet (5 mg total) by mouth 3 (three) times daily with meals. 06/14/20   Rosalio MacadamiaGerhardt, Lori C, NP  montelukast (SINGULAIR) 10 MG tablet Take 10 mg by mouth daily.     [provider]  omeprazole (PRILOSEC) 40 MG capsule TAKE 1 CAPSULE(40 MG) BY MOUTH DAILY 12/25/20   Esterwood, Amy S, PA-C  pantoprazole (PROTONIX) 40 MG tablet Take 1 tablet (40 mg total) by mouth daily. 01/11/20   Steffanie Dunnlark, Laura P, DO  pregabalin (LYRICA) 150 MG capsule Take 150 mg by mouth 2 (two) times daily.    [provider]  propranolol (INDERAL) 20 MG tablet TAKE 1 TABLET(20 MG) BY MOUTH THREE TO FOUR TIMES DAILY AS NEEDED 09/05/20   Dyann KiefLenze, Michele M, PA-C  tolterodine (DETROL LA) 4 MG 24 hr capsule Take 1 capsule by mouth daily. 04/03/20   [provider]  Vitamin D, Ergocalciferol, (DRISDOL) 1.25 MG (50000 UT) CAPS capsule Take 50,000 Units by mouth every Monday.  07/25/19   [provider]    Allergies    Tylenol [acetaminophen], Adhesive  [tape], Hydrocodone-acetaminophen, Nsaids, and Latex  Review of Systems   Review of  Systems  Constitutional: Negative for chills and fever.  HENT: Negative for ear pain and sore throat.   Eyes: Negative for pain and visual disturbance.  Respiratory: Negative for cough and shortness of breath.   Cardiovascular: Negative for chest pain and palpitations.  Gastrointestinal: Negative for abdominal pain and vomiting.  Genitourinary: Negative for dysuria and hematuria.  Musculoskeletal: Negative for arthralgias and back pain.  Skin: Negative for color change and rash.  Neurological: Negative for seizures and syncope.  Psychiatric/Behavioral: Positive for behavioral problems, self-injury, sleep disturbance and suicidal ideas. The patient is nervous/anxious.   All other systems reviewed and are negative.   Physical Exam Updated Vital Signs BP 118/85 (BP Location: Left Arm)   Pulse 89   Temp 98.9 F (37.2 C) (Oral)   Resp 16   Ht 5\' 2"  (1.575 m)   Wt 55.8 kg   LMP 10/07/2016   SpO2 98%   BMI 22.50 kg/m   Physical Exam Vitals and nursing note reviewed.  Constitutional:      General: She is not in acute distress.    Appearance: She is well-developed and well-nourished.  HENT:     Head: Normocephalic and atraumatic.  Eyes:     Conjunctiva/sclera: Conjunctivae normal.  Cardiovascular:     Rate and Rhythm: Normal rate and regular rhythm.     Heart sounds: No murmur heard.   Pulmonary:     Effort: Pulmonary effort is normal. No respiratory distress.     Breath sounds: Normal breath sounds.  Abdominal:     Palpations: Abdomen is soft.     Tenderness: There is no abdominal tenderness.  Musculoskeletal:        General: No edema.     Cervical back: Neck supple.  Skin:    General: Skin is warm and dry.  Neurological:     Mental Status: She is alert.  Psychiatric:        Attention and Perception: Attention and perception normal.        Mood and Affect: Mood is depressed. Affect is flat and tearful. Affect is not angry.  Speech: Speech is delayed.         Behavior: Behavior is slowed and withdrawn.        Thought Content: Thought content includes suicidal ideation. Thought content does not include homicidal ideation. Thought content includes suicidal plan. Thought content does not include homicidal plan.        Cognition and Memory: Cognition and memory normal.        Judgment: Judgment normal.     ED Results / Procedures / Treatments   Labs (all labs ordered are listed, but only abnormal results are displayed) Labs Reviewed  COMPREHENSIVE METABOLIC PANEL - Abnormal; Notable for the following components:      Result Value   Calcium 8.3 (*)    All other components within normal limits  ETHANOL - Abnormal; Notable for the following components:   Alcohol, Ethyl (B) 259 (*)    All other components within normal limits  SALICYLATE LEVEL - Abnormal; Notable for the following components:   Salicylate Lvl <7.0 (*)    All other components within normal limits  ACETAMINOPHEN LEVEL - Abnormal; Notable for the following components:   Acetaminophen (Tylenol), Serum <10 (*)    All other components within normal limits  CBC WITH DIFFERENTIAL/PLATELET - Abnormal; Notable for the following components:   RDW 15.7 (*)    Platelets 412 (*)    All other components within normal limits  RAPID URINE DRUG SCREEN, HOSP PERFORMED  CBC  I-STAT BETA HCG BLOOD, ED ([facility], [facility], [facility] ONLY)    EKG None  Radiology No results found.  Procedures Procedures   Medications Ordered in ED Medications - No data to display  ED Course  I have reviewed the triage vital signs and the nursing notes.  Pertinent labs & imaging results that were available during my care of the patient were reviewed by me and considered in my medical decision making (see chart for details).    MDM Rules/Calculators/A&P                           Patient presents to the ER with complaints of suicidal ideation with plan, requiring medical current clearance for evaluation by psychiatry.   On presentation, the patient is tearful, with flat affect.  Vitals personally reviewed by me, overall reassuring.  I personally reviewed her lab work, which did not show any significant abnormalities.  CBC without leukocytosis, normal hemoglobin.  CMP without electrolyte abnormalities, normal BUN/creatinine.  Negative acetaminophen, salicylate, ethanol of 259. No evidence of acute withdrawal/delirium trements. UDS negative.    She has been medically cleared for further evaluation by TTS.  Dispo according to the recommendation.  Final Clinical Impression(s) / ED Diagnoses Final diagnoses:  Suicidal ideation    Rx / DC Orders ED Discharge Orders    None       Leone Brand 01/25/21 1520    Gwyneth Sprout, MD 01/25/21 1527

## 2021-01-25 NOTE — ED Provider Notes (Addendum)
Informed by nursing staff that the patient is complaining of chest pain.  Patient with a history of atypical chest pain, reported prior stents.  She also complains of headache. No neuro deficits noted.  Pain is central, nonradiating.  Pt with recorded anaphylactic reaction to Tylenol, cannot have NSAIDS secondary to gastric bypass.  Will try Reglan for headache.  Suspect possible reflux given recent alcohol intoxication, but will check EKG, chest x-ray, troponin.  We will also provide GI cocktail. CIWA protocol initiated  EKG with no acute changes. Chest xray with no acute abnormality.   Signed out care to [employer] who will oversee troponins. If this is normal, patient is medically cleared.    Results for orders placed or performed during the hospital encounter of 01/25/21  Comprehensive metabolic panel  Result Value Ref Range   Sodium 144 135 - 145 mmol/L   Potassium 3.7 3.5 - 5.1 mmol/L   Chloride 108 98 - 111 mmol/L   CO2 24 22 - 32 mmol/L   Glucose, Bld 90 70 - 99 mg/dL   BUN 7 6 - 20 mg/dL   Creatinine, Ser 9.62 0.44 - 1.00 mg/dL   Calcium 8.3 (L) 8.9 - 10.3 mg/dL   Total Protein 7.2 6.5 - 8.1 g/dL   Albumin 4.0 3.5 - 5.0 g/dL   AST 22 15 - 41 U/L   ALT 17 0 - 44 U/L   Alkaline Phosphatase 45 38 - 126 U/L   Total Bilirubin 0.4 0.3 - 1.2 mg/dL   GFR, Estimated >83 >66 mL/min   Anion gap 12 5 - 15  Ethanol  Result Value Ref Range   Alcohol, Ethyl (B) 259 (H) <10 mg/dL  Salicylate level  Result Value Ref Range   Salicylate Lvl <7.0 (L) 7.0 - 30.0 mg/dL  Acetaminophen level  Result Value Ref Range   Acetaminophen (Tylenol), Serum <10 (L) 10 - 30 ug/mL  Rapid urine drug screen (hospital performed)  Result Value Ref Range   Opiates NONE DETECTED NONE DETECTED   Cocaine NONE DETECTED NONE DETECTED   Benzodiazepines NONE DETECTED NONE DETECTED   Amphetamines NONE DETECTED NONE DETECTED   Tetrahydrocannabinol NONE DETECTED NONE DETECTED   Barbiturates NONE DETECTED NONE  DETECTED  CBC with Differential/Platelet  Result Value Ref Range   WBC 5.5 4.0 - 10.5 K/uL   RBC 4.27 3.87 - 5.11 MIL/uL   Hemoglobin 12.0 12.0 - 15.0 g/dL   HCT 29.4 76.5 - 46.5 %   MCV 88.5 80.0 - 100.0 fL   MCH 28.1 26.0 - 34.0 pg   MCHC 31.7 30.0 - 36.0 g/dL   RDW 03.5 (H) 46.5 - 68.1 %   Platelets 412 (H) 150 - 400 K/uL   nRBC 0.0 0.0 - 0.2 %   Neutrophils Relative % 46 %   Neutro Abs 2.6 1.7 - 7.7 K/uL   Lymphocytes Relative 45 %   Lymphs Abs 2.5 0.7 - 4.0 K/uL   Monocytes Relative 6 %   Monocytes Absolute 0.4 0.1 - 1.0 K/uL   Eosinophils Relative 2 %   Eosinophils Absolute 0.1 0.0 - 0.5 K/uL   Basophils Relative 1 %   Basophils Absolute 0.0 0.0 - 0.1 K/uL   Immature Granulocytes 0 %   Abs Immature Granulocytes 0.01 0.00 - 0.07 K/uL  I-Stat beta hCG blood, ED  Result Value Ref Range   I-stat hCG, quantitative <5.0 <5 mIU/mL   Comment 3           DG Chest  Portable 1 View  Result Date: 01/25/2021 CLINICAL DATA:  Chest pain EXAM: PORTABLE CHEST 1 VIEW COMPARISON:  07/12/2020 FINDINGS: The heart size and mediastinal contours are within normal limits. Both lungs are clear. The visualized skeletal structures are unremarkable. IMPRESSION: No active disease. Electronically Signed   By: Deatra Robinson M.D.   On: 01/25/2021 21:48        Leone Brand 01/25/21 2204    Gwyneth Sprout, MD 01/26/21 2133

## 2021-01-25 NOTE — ED Notes (Signed)
Attempted to draw blood but unable because of thin veins that roll. Called phlebotomy who said they will be here soon to try. Urine collected. Pt affect flat with depressed mood. Cooperative.

## 2021-01-25 NOTE — ED Notes (Addendum)
ONE BLUE SUITCASE IS IN THE DAY ROOM OF THE [facility].  THREE HOSPITAL BAGS ARE IN [facility]29

## 2021-01-25 NOTE — ED Notes (Signed)
Pt is tearful, and guarded.

## 2021-01-25 NOTE — ED Triage Notes (Addendum)
Pt with multiple mental health complaints, states she is having hallucinations, nightmares, suicidal with plan to take medication. Pt rambling with symptoms in triage. Difficult to stay focused during triage

## 2021-01-26 DIAGNOSIS — F332 Major depressive disorder, recurrent severe without psychotic features: Secondary | ICD-10-CM | POA: Diagnosis not present

## 2021-01-26 LAB — TROPONIN I (HIGH SENSITIVITY): Troponin I (High Sensitivity): <2 ng/L (ref <18)

## 2021-01-26 MED ORDER — VALACYCLOVIR HCL 500 MG PO TABS
2000.0000 mg | ORAL_TABLET | Freq: Two times a day (BID) | ORAL | Status: AC
  Administered 2021-01-26 (×2): 2000 mg via ORAL
  Filled 2021-01-26 (×2): qty 4

## 2021-01-26 MED ORDER — MIDODRINE HCL 5 MG PO TABS
5.0000 mg | ORAL_TABLET | Freq: Three times a day (TID) | ORAL | Status: DC
  Administered 2021-01-26 – 2021-01-30 (×11): 5 mg via ORAL
  Filled 2021-01-26 (×16): qty 1

## 2021-01-26 MED ORDER — LORAZEPAM 1 MG PO TABS
1.0000 mg | ORAL_TABLET | Freq: Four times a day (QID) | ORAL | Status: AC
  Administered 2021-01-26 (×3): 1 mg via ORAL
  Filled 2021-01-26 (×3): qty 1

## 2021-01-26 MED ORDER — FERROUS SULFATE 325 (65 FE) MG PO TABS
325.0000 mg | ORAL_TABLET | Freq: Two times a day (BID) | ORAL | Status: DC
  Administered 2021-01-26 – 2021-01-30 (×8): 325 mg via ORAL
  Filled 2021-01-26 (×8): qty 1

## 2021-01-26 MED ORDER — ONDANSETRON 4 MG PO TBDP
4.0000 mg | ORAL_TABLET | Freq: Four times a day (QID) | ORAL | Status: AC | PRN
  Administered 2021-01-27 – 2021-01-29 (×2): 4 mg via ORAL
  Filled 2021-01-26 (×2): qty 1

## 2021-01-26 MED ORDER — MOMETASONE FURO-FORMOTEROL FUM 200-5 MCG/ACT IN AERO
2.0000 | INHALATION_SPRAY | Freq: Two times a day (BID) | RESPIRATORY_TRACT | Status: DC
  Administered 2021-01-26 – 2021-01-30 (×8): 2 via RESPIRATORY_TRACT
  Filled 2021-01-26: qty 8.8

## 2021-01-26 MED ORDER — LORAZEPAM 1 MG PO TABS
1.0000 mg | ORAL_TABLET | Freq: Two times a day (BID) | ORAL | Status: AC
Start: 2021-01-28 — End: 2021-01-28
  Administered 2021-01-28 (×2): 1 mg via ORAL
  Filled 2021-01-26 (×2): qty 1

## 2021-01-26 MED ORDER — FUROSEMIDE 40 MG PO TABS
40.0000 mg | ORAL_TABLET | Freq: Every day | ORAL | Status: DC
  Administered 2021-01-26 – 2021-01-30 (×5): 40 mg via ORAL
  Filled 2021-01-26 (×5): qty 1

## 2021-01-26 MED ORDER — LORAZEPAM 1 MG PO TABS
1.0000 mg | ORAL_TABLET | Freq: Every day | ORAL | Status: AC
Start: 2021-01-29 — End: 2021-01-29
  Administered 2021-01-29: 1 mg via ORAL
  Filled 2021-01-26: qty 1

## 2021-01-26 MED ORDER — THIAMINE HCL 100 MG PO TABS
100.0000 mg | ORAL_TABLET | Freq: Every day | ORAL | Status: DC
  Administered 2021-01-26 – 2021-01-30 (×5): 100 mg via ORAL
  Filled 2021-01-26 (×5): qty 1

## 2021-01-26 MED ORDER — LORAZEPAM 1 MG PO TABS
1.0000 mg | ORAL_TABLET | Freq: Three times a day (TID) | ORAL | Status: AC
  Administered 2021-01-27 (×3): 1 mg via ORAL
  Filled 2021-01-26 (×3): qty 1

## 2021-01-26 MED ORDER — ALUM & MAG HYDROXIDE-SIMETH 200-200-20 MG/5ML PO SUSP
30.0000 mL | Freq: Once | ORAL | Status: DC
  Filled 2021-01-26: qty 30

## 2021-01-26 MED ORDER — HYDROXYZINE HCL 25 MG PO TABS
25.0000 mg | ORAL_TABLET | Freq: Four times a day (QID) | ORAL | Status: AC | PRN
  Administered 2021-01-26: 25 mg via ORAL
  Filled 2021-01-26: qty 1

## 2021-01-26 MED ORDER — GABAPENTIN 300 MG PO CAPS
300.0000 mg | ORAL_CAPSULE | Freq: Three times a day (TID) | ORAL | Status: DC
  Administered 2021-01-26 – 2021-01-30 (×11): 300 mg via ORAL
  Filled 2021-01-26 (×11): qty 1

## 2021-01-26 MED ORDER — THIAMINE HCL 100 MG/ML IJ SOLN
100.0000 mg | Freq: Once | INTRAMUSCULAR | Status: AC
  Administered 2021-01-26: 100 mg via INTRAMUSCULAR
  Filled 2021-01-26: qty 2

## 2021-01-26 MED ORDER — UMECLIDINIUM BROMIDE 62.5 MCG/INH IN AEPB
1.0000 | INHALATION_SPRAY | Freq: Every day | RESPIRATORY_TRACT | Status: DC
  Administered 2021-01-26 – 2021-01-30 (×4): 1 via RESPIRATORY_TRACT
  Filled 2021-01-26: qty 7

## 2021-01-26 MED ORDER — LORAZEPAM 1 MG PO TABS: 1.0000 mg | ORAL_TABLET | Freq: Four times a day (QID) | ORAL | Status: AC | PRN

## 2021-01-26 MED ORDER — DOCUSATE SODIUM 100 MG PO CAPS
200.0000 mg | ORAL_CAPSULE | Freq: Once | ORAL | Status: AC
  Administered 2021-01-26: 200 mg via ORAL
  Filled 2021-01-26: qty 2

## 2021-01-26 MED ORDER — FLUTICASONE PROPIONATE 50 MCG/ACT NA SUSP
2.0000 | Freq: Every day | NASAL | Status: DC
  Administered 2021-01-26 – 2021-01-30 (×5): 2 via NASAL
  Filled 2021-01-26: qty 16

## 2021-01-26 MED ORDER — AZELASTINE HCL 0.1 % NA SOLN
2.0000 | Freq: Two times a day (BID) | NASAL | Status: DC
  Administered 2021-01-26 – 2021-01-30 (×9): 2 via NASAL
  Filled 2021-01-26: qty 30

## 2021-01-26 MED ORDER — LOPERAMIDE HCL 2 MG PO CAPS: 2.0000 mg | ORAL_CAPSULE | ORAL | Status: AC | PRN

## 2021-01-26 MED ORDER — ALBUTEROL SULFATE HFA 108 (90 BASE) MCG/ACT IN AERS: 1.0000 | INHALATION_SPRAY | Freq: Four times a day (QID) | RESPIRATORY_TRACT | Status: DC | PRN

## 2021-01-26 MED ORDER — LORAZEPAM 1 MG PO TABS
2.0000 mg | ORAL_TABLET | Freq: Once | ORAL | Status: AC
  Administered 2021-01-26: 2 mg via ORAL
  Filled 2021-01-26: qty 2

## 2021-01-26 MED ORDER — DULOXETINE HCL 20 MG PO CPEP
20.0000 mg | ORAL_CAPSULE | Freq: Every day | ORAL | Status: DC
  Administered 2021-01-26 – 2021-01-30 (×5): 20 mg via ORAL
  Filled 2021-01-26 (×5): qty 1

## 2021-01-26 MED ORDER — PANTOPRAZOLE SODIUM 40 MG PO TBEC
40.0000 mg | DELAYED_RELEASE_TABLET | Freq: Every day | ORAL | Status: DC
  Administered 2021-01-26 – 2021-01-30 (×5): 40 mg via ORAL
  Filled 2021-01-26 (×5): qty 1

## 2021-01-26 MED ORDER — GLYCOPYRROLATE 1 MG PO TABS
2.0000 mg | ORAL_TABLET | Freq: Two times a day (BID) | ORAL | Status: DC
  Administered 2021-01-26 – 2021-01-30 (×9): 2 mg via ORAL
  Filled 2021-01-26 (×9): qty 2

## 2021-01-26 MED ORDER — LIDOCAINE VISCOUS HCL 2 % MT SOLN
15.0000 mL | Freq: Once | OROMUCOSAL | Status: DC
  Filled 2021-01-26 (×2): qty 15

## 2021-01-26 MED ORDER — ADULT MULTIVITAMIN W/MINERALS CH
1.0000 | ORAL_TABLET | Freq: Every day | ORAL | Status: DC
  Administered 2021-01-26 – 2021-01-30 (×5): 1 via ORAL
  Filled 2021-01-26 (×5): qty 1

## 2021-01-26 MED ORDER — HYDROXYZINE HCL 25 MG PO TABS: 25.0000 mg | ORAL_TABLET | Freq: Four times a day (QID) | ORAL | Status: DC | PRN

## 2021-01-26 MED ORDER — BUDESON-GLYCOPYRROL-FORMOTEROL 160-9-4.8 MCG/ACT IN AERO: 2.0000 | INHALATION_SPRAY | Freq: Two times a day (BID) | RESPIRATORY_TRACT | Status: DC

## 2021-01-26 MED ORDER — ONDANSETRON HCL 4 MG PO TABS
4.0000 mg | ORAL_TABLET | Freq: Once | ORAL | Status: AC
  Administered 2021-01-26: 4 mg via ORAL
  Filled 2021-01-26: qty 1

## 2021-01-26 NOTE — ED Provider Notes (Signed)
Nursing reports that patient feels she is developing a cold sore.  She is requesting Valtrex to prevent it from enlarging.  Patient will be given 2 doses of Valtrex today to help prevent this.  Nursing also reported patient was having some sensation of things crawling on her skin, ACL was evaluated and she was found to be 12.  We will give some Ativan and let the psychiatry team know about these hallucinations.  She is also asking about home psychiatric meds which we will defer to the psychiatric team for.   Tegeler, Canary Brim, MD 01/26/21 [phone]

## 2021-01-26 NOTE — Consult Note (Signed)
[facility] Face-to-Face Psychiatry Consult   Reason for Consult:  Suicidal Ideations Referring Physician:  EDP Patient Identification: Leslie Sanders MRN:  [id number] Principal Diagnosis: Major depressive disorder, recurrent severe without psychotic features (HCC) Diagnosis:  Principal Problem:   Major depressive disorder, recurrent severe without psychotic features (HCC) Active Problems:   Alcohol use disorder, moderate, dependence (HCC)   Total Time spent with patient: 45 minutes  Subjective:   Leslie Sanders is a 45 y.o. female patient admitted with Suicidal Ideation with plan and intent.  HPI:   Patient seen and evaluated by this provider in person. Patient reports "I'm overwhelmed and tired." Patient states "I've been contemplating things, such as overdosing on my medications." Patient reports the suicidal ideations worsened the past 3 months, especially over the past three days, which is around the time she was unable to refill her psychiatric medications at the pharmacy. Patient reports a previous suicide attempt about 5-6 years ago. Patient reports constant pain and poor sleep has worsened the suicidal ideations. Patient states "my pain has been very bad." Patient attempts to help the pain with a heating pad, however it provides little to no relief. Patient reports utilizing alcohol to help suppress the pain and to aid in sleep. Patient reports drinking 4 glasses of wine/day. Patient reports having a long history of alcohol misuse, which patient states " I'm unable to maintain sobriety because of withdrawal symptoms." Patient reports her withdrawal symptoms as headache, agitation, tremors. Patient reports briefly being in therapy, but is not currently seeing a therapist. Patient denies homicidal ideations, hallucinations. and suicidal ideations with a plan and intent. Patient reports living with her 2 year old son who is Autistic, currently her son is being taken care of by a relative while she is  in the Emergency room. This is a stressor for her along with having minimal support and chronic back pain.  Past Psychiatric History: See Below.  Risk to Self:   Risk to Others:   Prior Inpatient Therapy:   Prior Outpatient Therapy:    Past Medical History:  Past Medical History:  Diagnosis Date  . Anemia    (a) chronic blood loss hx of menorrhagia s/p endometrial biopsy 04/2015 (b) iron deficient  . Anxiety   . B12 deficiency   . Bipolar 1 disorder (HCC)   . Cervical spinal stenosis   . Cervical spondylosis without myelopathy 07/08/2016  . Chronic pain of right thumb 04/28/2018  . Depression   . GERD (gastroesophageal reflux disease)   . Hemorrhoids    Pt with constipation secondary to iron supplement. Pt has had rectal bleed secondary to internal hemorrhoids. Pt is s/p EGD/colonoscopy 05/2012  . 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 Migraine   . PONV (postoperative nausea and vomiting)   . PTSD (post-traumatic stress disorder)   . Seasonal allergies   . Vitamin E deficiency   . Zinc deficiency     Past Surgical History:  Procedure Laterality Date  . ABDOMINAL HYSTERECTOMY    . BREAST LUMPECTOMY Bilateral   . BREAST REDUCTION SURGERY    . Carpal Metacarpal Arthroplasty Right 05/13/2019  . CHOLECYSTECTOMY  1990  . HAND SURGERY    . HYSTERECTOMY ABDOMINAL WITH SALPINGECTOMY  11/2017  . POSTERIOR CERVICAL LAMINECTOMY Bilateral 05/23/2019  . RIGHT/LEFT HEART CATH AND CORONARY ANGIOGRAPHY N/A 01/13/2020   Procedure: RIGHT/LEFT HEART CATH AND CORONARY ANGIOGRAPHY;  Surgeon: Lyn Records, MD;  Location: [facility] INVASIVE [facility];  Service: Cardiovascular;  Laterality:  N/A;  . ROUX-EN-Y GASTRIC BYPASS  2003   in [place]  . SPINAL FUSION  05/23/2019   Family History:  Family History  Problem Relation Age of Onset  . Schizophrenia Maternal Aunt   . ADD / ADHD Son   . ODD Son   . Breast cancer Mother   . Arthritis/Rheumatoid Mother   . Lupus Mother   . HIV Father   . Rheum  arthritis Maternal Grandmother   . COPD Maternal Grandmother   . COPD Paternal Grandmother    Family Psychiatric  History: See Below. Social History:  Social History   Substance and Sexual Activity  Alcohol Use Not Currently  . Alcohol/week: 28.0 standard drinks  . Types: 28 Cans of beer per week   Comment: Nothing in 4 years     Social History   Substance and Sexual Activity  Drug Use No    Social History   Socioeconomic History  . Marital status: Divorced    Spouse name: Not on file  . Number of children: Not on file  . Years of education: Not on file  . Highest education level: Not on file  Occupational History  . Not on file  Tobacco Use  . Smoking status: Former Smoker    Packs/day: 2.00    Years: 23.00    Pack years: 46.00    Types: Cigarettes    Quit date: 12/08/2018    Years since quitting: 2.1  . Smokeless tobacco: Never Used  Vaping Use  . Vaping Use: Never used  Substance and Sexual Activity  . Alcohol use: Not Currently    Alcohol/week: 28.0 standard drinks    Types: 28 Cans of beer per week    Comment: Nothing in 4 years  . Drug use: No  . Sexual activity: Not Currently  Other Topics Concern  . Not on file  Social History Narrative  . Not on file   Social Determinants of Health   Financial Resource Strain: Not on file  Food Insecurity: Not on file  Transportation Needs: Not on file  Physical Activity: Not on file  Stress: Not on file  Social Connections: Not on file   Additional Social History:    Allergies:   Allergies  Allergen Reactions  . Tylenol [Acetaminophen] Anaphylaxis    Per patient  . Adhesive  [Tape] Rash  . Hydrocodone-Acetaminophen Other (See Comments) and Nausea And Vomiting  . Nsaids Other (See Comments)    S/p bariatric surgery; increased risk of ulcers, gastritis and cancer  . Latex Rash    Labs:  Results for orders placed or performed during the hospital encounter of 01/25/21 (from the past 48 hour(s))   Troponin I (High Sensitivity)     Status: None   Collection Time: 01/25/21  1:05 AM  Result Value Ref Range   Troponin I (High Sensitivity) <2 <18 ng/L    Comment: (NOTE) Elevated high sensitivity troponin I (hsTnI) values and significant  changes across serial measurements may suggest ACS but many other  chronic and acute conditions are known to elevate hsTnI results.  Refer to the "Links" section for chest pain algorithms and additional  guidance. Performed at [facility], [address] [place], [place] [address]   Rapid urine drug screen (hospital performed)     Status: None   Collection Time: 01/25/21  1:44 PM  Result Value Ref Range   Opiates NONE DETECTED NONE DETECTED   Cocaine NONE DETECTED NONE DETECTED   Benzodiazepines NONE  DETECTED NONE DETECTED   Amphetamines NONE DETECTED NONE DETECTED   Tetrahydrocannabinol NONE DETECTED NONE DETECTED   Barbiturates NONE DETECTED NONE DETECTED    Comment: (NOTE) DRUG SCREEN FOR MEDICAL PURPOSES ONLY.  IF CONFIRMATION IS NEEDED FOR ANY PURPOSE, NOTIFY LAB WITHIN 5 DAYS.  LOWEST DETECTABLE LIMITS FOR URINE DRUG SCREEN Drug Class                     Cutoff (ng/mL) Amphetamine and metabolites    1000 Barbiturate and metabolites    200 Benzodiazepine                 200 Tricyclics and metabolites     300 Opiates and metabolites        300 Cocaine and metabolites        300 THC                            50 Performed at [facility], [address], [place], [place] [address]   Comprehensive metabolic panel     Status: Abnormal   Collection Time: 01/25/21  2:00 PM  Result Value Ref Range   Sodium 144 135 - 145 mmol/L   Potassium 3.7 3.5 - 5.1 mmol/L   Chloride 108 98 - 111 mmol/L   CO2 24 22 - 32 mmol/L   Glucose, Bld 90 70 - 99 mg/dL    Comment: Glucose reference range applies only to samples taken after fasting for at least 8 hours.   BUN 7 6 - 20 mg/dL   Creatinine, Ser 1.91  0.44 - 1.00 mg/dL   Calcium 8.3 (L) 8.9 - 10.3 mg/dL   Total Protein 7.2 6.5 - 8.1 g/dL   Albumin 4.0 3.5 - 5.0 g/dL   AST 22 15 - 41 U/L   ALT 17 0 - 44 U/L   Alkaline Phosphatase 45 38 - 126 U/L   Total Bilirubin 0.4 0.3 - 1.2 mg/dL   GFR, Estimated >47 >82 mL/min    Comment: (NOTE) Calculated using the CKD-EPI Creatinine Equation (2021)    Anion gap 12 5 - 15    Comment: Performed at [facility], [address] [place], [place] [address]  Ethanol     Status: Abnormal   Collection Time: 01/25/21  2:00 PM  Result Value Ref Range   Alcohol, Ethyl (B) 259 (H) <10 mg/dL    Comment: (NOTE) Lowest detectable limit for serum alcohol is 10 mg/dL.  For medical purposes only. Performed at [facility], [address] [place], [place] [address]   Salicylate level     Status: Abnormal   Collection Time: 01/25/21  2:00 PM  Result Value Ref Range   Salicylate Lvl <7.0 (L) 7.0 - 30.0 mg/dL    Comment: Performed at [facility], [address] [place], [place] [address]  Acetaminophen level     Status: Abnormal   Collection Time: 01/25/21  2:00 PM  Result Value Ref Range   Acetaminophen (Tylenol), Serum <10 (L) 10 - 30 ug/mL    Comment: (NOTE) Therapeutic concentrations vary significantly. A range of 10-30 ug/mL  may be an effective concentration for many patients. However, some  are best treated at concentrations outside of this range. Acetaminophen concentrations >150 ug/mL at 4 hours after ingestion  and >50 ug/mL at 12 hours after ingestion are often associated with  toxic reactions.  Performed at [facility], [address] [place], [place] [address]   CBC with Differential/Platelet     Status: Abnormal   Collection Time: 01/25/21  2:00 PM  Result Value Ref Range   WBC 5.5 4.0 - 10.5 K/uL   RBC 4.27 3.87 - 5.11 MIL/uL   Hemoglobin 12.0 12.0 - 15.0 g/dL   HCT 10.2 58.5 - 27.7 %   MCV 88.5 80.0  - 100.0 fL   MCH 28.1 26.0 - 34.0 pg   MCHC 31.7 30.0 - 36.0 g/dL   RDW 82.4 (H) 23.5 - 36.1 %   Platelets 412 (H) 150 - 400 K/uL   nRBC 0.0 0.0 - 0.2 %   Neutrophils Relative % 46 %   Neutro Abs 2.6 1.7 - 7.7 K/uL   Lymphocytes Relative 45 %   Lymphs Abs 2.5 0.7 - 4.0 K/uL   Monocytes Relative 6 %   Monocytes Absolute 0.4 0.1 - 1.0 K/uL   Eosinophils Relative 2 %   Eosinophils Absolute 0.1 0.0 - 0.5 K/uL   Basophils Relative 1 %   Basophils Absolute 0.0 0.0 - 0.1 K/uL   Immature Granulocytes 0 %   Abs Immature Granulocytes 0.01 0.00 - 0.07 K/uL    Comment: Performed at [facility], [address], [place], [place] [address]  I-Stat beta hCG blood, ED     Status: None   Collection Time: 01/25/21  2:34 PM  Result Value Ref Range   I-stat hCG, quantitative <5.0 <5 mIU/mL   Comment 3            Comment:   GEST. AGE      CONC.  (mIU/mL)   <=1 WEEK        5 - 50     2 WEEKS       50 - 500     3 WEEKS       100 - 10,000     4 WEEKS     1,000 - 30,000        FEMALE AND NON-PREGNANT FEMALE:     LESS THAN 5 mIU/mL   Troponin I (High Sensitivity)     Status: None   Collection Time: 01/25/21  9:40 PM  Result Value Ref Range   Troponin I (High Sensitivity) <2 <18 ng/L    Comment: (NOTE) Elevated high sensitivity troponin I (hsTnI) values and significant  changes across serial measurements may suggest ACS but many other  chronic and acute conditions are known to elevate hsTnI results.  Refer to the "Links" section for chest pain algorithms and additional  guidance. Performed at [facility], [address] [place], [place] [address]     Current Facility-Administered Medications  Medication Dose Route Frequency Provider Last Rate Last Admin  . albuterol (VENTOLIN HFA) 108 (90 Base) MCG/ACT inhaler 1 puff  1 puff Inhalation Q6H PRN Charm Rings, NP      . alum & mag hydroxide-simeth (MAALOX/MYLANTA) 200-200-20 MG/5ML suspension 30 mL  30 mL  Oral Once McDonald, Mia A, PA-C       And  . lidocaine (XYLOCAINE) 2 % viscous mouth solution 15 mL  15 mL Oral Once McDonald, Mia A, PA-C      . azelastine (ASTELIN) 0.1 % nasal spray 2 spray  2 spray Each Nare BID Charm Rings, NP   2 spray at 01/26/21 1312  . ferrous sulfate tablet 325 mg  325 mg Oral BID WC Charm Rings, NP      .  fluticasone (FLONASE) 50 MCG/ACT nasal spray 2 spray  2 spray Each Nare Daily Charm Rings, NP   2 spray at 01/26/21 1311  . furosemide (LASIX) tablet 40 mg  40 mg Oral Daily Charm Rings, NP   40 mg at 01/26/21 1142  . glycopyrrolate (ROBINUL) tablet 2 mg  2 mg Oral BID Charm Rings, NP   2 mg at 01/26/21 1310  . hydrOXYzine (ATARAX/VISTARIL) tablet 25 mg  25 mg Oral Q6H PRN Charm Rings, NP      . loperamide (IMODIUM) capsule 2-4 mg  2-4 mg Oral PRN Charm Rings, NP      . LORazepam (ATIVAN) tablet 1 mg  1 mg Oral Q6H PRN Charm Rings, NP      . LORazepam (ATIVAN) tablet 1 mg  1 mg Oral QID Charm Rings, NP   1 mg at 01/26/21 1142   Followed by  . [START ON 01/27/2021] LORazepam (ATIVAN) tablet 1 mg  1 mg Oral TID Charm Rings, NP       Followed by  . [START ON 01/28/2021] LORazepam (ATIVAN) tablet 1 mg  1 mg Oral BID Charm Rings, NP       Followed by  . [START ON 01/29/2021] LORazepam (ATIVAN) tablet 1 mg  1 mg Oral Daily Lord, Jamison Y, NP      . midodrine (PROAMATINE) tablet 5 mg  5 mg Oral TID WC Charm Rings, NP   5 mg at 01/26/21 1310  . mometasone-formoterol (DULERA) 200-5 MCG/ACT inhaler 2 puff  2 puff Inhalation BID Charm Rings, NP   2 puff at 01/26/21 1310   And  . umeclidinium bromide (INCRUSE ELLIPTA) 62.5 MCG/INH 1 puff  1 puff Inhalation Daily Charm Rings, NP   1 puff at 01/26/21 1313  . multivitamin with minerals tablet 1 tablet  1 tablet Oral Daily Charm Rings, NP   1 tablet at 01/26/21 1054  . ondansetron (ZOFRAN-ODT) disintegrating tablet 4 mg  4 mg Oral Q6H PRN Charm Rings, NP      .  pantoprazole (PROTONIX) EC tablet 40 mg  40 mg Oral Daily Charm Rings, NP   40 mg at 01/26/21 1136  . [START ON 01/27/2021] thiamine tablet 100 mg  100 mg Oral Daily Charm Rings, NP   100 mg at 01/26/21 1054  . valACYclovir (VALTREX) tablet 2,000 mg  2,000 mg Oral BID Tegeler, Canary Brim, MD   2,000 mg at 01/26/21 0915   Current Outpatient Medications  Medication Sig Dispense Refill  . albuterol (PROVENTIL) (2.5 MG/3ML) 0.083% nebulizer solution Take 3 mLs (2.5 mg total) by nebulization every 6 (six) hours as needed for wheezing or shortness of breath (Inhale 3ml every 6 hours and as needed.). 360 mL 1  . albuterol (VENTOLIN HFA) 108 (90 Base) MCG/ACT inhaler INHALE 1 PUFF INTO THE LUNGS EVERY 6 HOURS AS NEEDED FOR WHEEZING OR SHORTNESS OF BREATH 6.7 g 3  . azelastine (ASTELIN) 0.1 % nasal spray Place 2 sprays into both nostrils 2 (two) times daily. Use in each nostril as directed 30 mL 12  . Budeson-Glycopyrrol-Formoterol (BREZTRI AEROSPHERE) 160-9-4.8 MCG/ACT AERO Inhale 2 puffs into the lungs 2 (two) times daily. 10.7 g 11  . busPIRone (BUSPAR) 5 MG tablet Take 5 mg by mouth 2 (two) times daily.    . chlorhexidine (PERIDEX) 0.12 % solution SMARTSIG:By Mouth    . cyanocobalamin (,VITAMIN B-12,) 1000 MCG/ML injection Inject  1 mL into the muscle every 30 (thirty) days.    Marland Kitchen docusate sodium (COLACE) 100 MG capsule Take 100 mg by mouth daily.    . DULoxetine (CYMBALTA) 60 MG capsule Take 60 mg by mouth daily.     . DUPIXENT 300 MG/2ML prefilled syringe INJECT 300 MG INTO THE SKIN EVERY 14 (FOURTEEN) DAYS. 4 mL 5  . ferrous sulfate 325 (65 FE) MG tablet Take 1 tablet (325 mg total) by mouth 2 (two) times daily with a meal. 60 tablet 0  . fluticasone (FLONASE) 50 MCG/ACT nasal spray Place 2 sprays into both nostrils daily. 16 g 11  . folic acid (FOLVITE) 1 MG tablet Take 1 mg by mouth daily.     . furosemide (LASIX) 40 MG tablet Take 1 tablet (40 mg total) by mouth daily. 90 tablet 3  .  glycopyrrolate (ROBINUL) 2 MG tablet Take 1 tablet (2 mg total) by mouth 2 (two) times daily. 180 tablet 3  . hydrOXYzine (ATARAX/VISTARIL) 25 MG tablet Take 1 tablet (25 mg total) by mouth every 6 (six) hours as needed for anxiety. 12 tablet 0  . midodrine (PROAMATINE) 5 MG tablet Take 1 tablet (5 mg total) by mouth 3 (three) times daily with meals. 270 tablet 3  . montelukast (SINGULAIR) 10 MG tablet Take 10 mg by mouth daily.     Marland Kitchen omeprazole (PRILOSEC) 40 MG capsule TAKE 1 CAPSULE(40 MG) BY MOUTH DAILY 90 capsule 3  . pantoprazole (PROTONIX) 40 MG tablet Take 1 tablet (40 mg total) by mouth daily. 30 tablet 11  . pregabalin (LYRICA) 150 MG capsule Take 150 mg by mouth 2 (two) times daily.    . propranolol (INDERAL) 20 MG tablet TAKE 1 TABLET(20 MG) BY MOUTH THREE TO FOUR TIMES DAILY AS NEEDED 180 tablet 3  . tolterodine (DETROL LA) 4 MG 24 hr capsule Take 1 capsule by mouth daily.    . Vitamin D, Ergocalciferol, (DRISDOL) 1.25 MG (50000 UT) CAPS capsule Take 50,000 Units by mouth every Monday.       Musculoskeletal: Strength & Muscle Tone: within normal limits Gait & Station: unsteady Patient leans: N/A  Psychiatric Specialty Exam: Physical Exam Vitals and nursing note reviewed.  Constitutional:      Appearance: Normal appearance.  HENT:     Head: Normocephalic.     Nose: Nose normal.  Musculoskeletal:     Cervical back: Normal range of motion.  Neurological:     General: No focal deficit present.     Mental Status: She is alert and oriented to person, place, and time.  Psychiatric:        Attention and Perception: Attention normal.        Mood and Affect: Mood is depressed. Affect is blunt.        Speech: Speech normal.        Behavior: Behavior normal.        Thought Content: Thought content includes suicidal ideation. Thought content includes suicidal plan.        Cognition and Memory: Cognition and memory normal.        Judgment: Judgment is impulsive.     Review of  Systems  Musculoskeletal: Positive for back pain.  Psychiatric/Behavioral: Positive for dysphoric mood, sleep disturbance and suicidal ideas.  All other systems reviewed and are negative.   Blood pressure 115/79, pulse 98, temperature 97.9 F (36.6 C), temperature source Oral, resp. rate 18, height 5\' 2"  (1.575 m), weight 55.8 kg, last menstrual period 10/07/2016,  SpO2 100 %.Body mass index is 22.5 kg/m.  General Appearance: Casual  Eye Contact:  Good  Speech:  Clear and Coherent  Volume:  Decreased  Mood:  Depressed and Dysphoric  Affect:  Blunt and Congruent  Thought Process:  Coherent  Orientation:  Full (Time, Place, and Person)  Thought Content:  Rumination  Suicidal Thoughts:  Yes.  with intent/plan  Homicidal Thoughts:  No  Memory:  Immediate;   Good Recent;   Good Remote;   Good  Judgement:  Poor  Insight:  Fair  Psychomotor Activity:  Normal  Concentration:  Concentration: Good and Attention Span: Good  Recall:  Good  Fund of Knowledge:  Good  Language:  Good  Akathisia:  No  Handed:  Right  AIMS (if indicated):     Assets:  Communication Skills Desire for Improvement Housing Social Support  ADL's:  Intact  Cognition:  WNL  Sleep:   Poor     Treatment Plan Summary: Daily contact with patient to assess and evaluate symptoms and progress in treatment, Medication management and Plan Major Depression Disorder, Recurrent, Severe without Psychosis   Major Depression Disorder, Recurrent, Severe without Psychosis  -Restarted Cymbalta 20 mg daily vs 60 mg daily as she was not taking prior to admission  Alcohol Use Disorder  -CIWA protocol with Ativan detox - Gabapentin 300 mg TID -Unable to start NSAIDs or acetaminophen due to allergy, verified with patient in person  Anxiety -Restart Hydroxyzine 25 mg Q6 PRN   Disposition: Recommend psychiatric Inpatient admission when medically cleared.  Nanine MeansJamison Lord, NP 01/26/2021 2:31 PM

## 2021-01-26 NOTE — ED Notes (Addendum)
Pt said, starting 3 weeks ago she was unable to get her Cymbalta 60 mg QD refilled. Her depression has worsened since then, especially over the past week. Said she typically takes zofran with her medications for nausea. Additionally, she requests valtrex for an oncoming cold sore.  Pt c/o itching all over and difficulty sleeping last night due to itching. Said she was unable to eat breakfast. Requesting zofran for nausa. CIWA is 12.

## 2021-01-26 NOTE — ED Provider Notes (Signed)
Emergency Medicine Observation Re-evaluation Note  Leslie Sanders is a 45 y.o. female, seen on rounds today.  Pt initially presented to the ED for complaints of Medical Clearance Currently, the patient is awaiting placement for further management of suicidal ideation.  Physical Exam  BP 105/72   Pulse 68   Temp 98.2 F (36.8 C) (Oral)   Resp 16   Ht 5\' 2"  (1.575 m)   Wt 55.8 kg   LMP 10/07/2016   SpO2 98%   BMI 22.50 kg/m  Physical Exam General: Resting comfortably with no agitation Cardiac: No murmur Lungs: Clear  ED Course / MDM  EKG:    I have reviewed the labs performed to date as well as medications administered while in observation.  Recent changes in the last 24 hours include she requested Valtrex for developing cold sore which I ordered.  She also had some more tactile hallucinations and Ativan was ordered.  She is feeling better.  She is still medically cleared awaiting placement.  Plan  Current plan is for inpatient placement.    Tegeler, 10/09/2016, MD 01/26/21 1240

## 2021-01-27 ENCOUNTER — Inpatient Hospital Stay (HOSPITAL_COMMUNITY): Admission: AD | Admit: 2021-01-27 | Payer: Medicare HMO | Source: Intra-hospital | Admitting: Psychiatry

## 2021-01-27 DIAGNOSIS — F332 Major depressive disorder, recurrent severe without psychotic features: Secondary | ICD-10-CM | POA: Diagnosis not present

## 2021-01-27 MED ORDER — VALACYCLOVIR HCL 500 MG PO TABS
1000.0000 mg | ORAL_TABLET | Freq: Two times a day (BID) | ORAL | Status: AC
  Administered 2021-01-27 (×2): 1000 mg via ORAL
  Filled 2021-01-27 (×2): qty 2

## 2021-01-27 NOTE — ED Notes (Signed)
Pt requesting dupixent injection and states she takes buspar. Does not know amount of buspar.

## 2021-01-27 NOTE — ED Provider Notes (Addendum)
Emergency Medicine Observation Re-evaluation Note  Leslie Sanders is a 45 y.o. female, seen on rounds today.  Pt initially presented to the ED for complaints of Medical Clearance Currently, the patient is awaiting placement for suicidal ideation.  Physical Exam  BP (!) 107/56 (BP Location: Left Arm)   Pulse 80   Temp 98 F (36.7 C) (Oral)   Resp 16   Ht 5\' 2"  (1.575 m)   Wt 55.8 kg   LMP 10/07/2016   SpO2 97%   BMI 22.50 kg/m  Physical Exam General: Resting comfortably Cardiac: No murmur Lungs: Clear Complaining of continued sensation of developing cold sore.  Requesting 1 more day of Valtrex which we will order  ED Course / MDM  EKG:    I have reviewed the labs performed to date as well as medications administered while in observation.  Recent changes in the last 24 hours include continues to feel a mild cold sore coming on.  Requested 1 more day of Valtrex.  Also reports that her Dupixent was due yesterday.  We will try to order to see if she can get in the emergency department..  Plan  Current plan is for inpatient placement .    Tegeler, 10/09/2016, MD 01/27/21 [phone]  9:35 AM The patient's injectable dupilumab does not appear to be available in the emergency department at this time.   Tegeler, 0938, MD 01/27/21 [phone]

## 2021-01-27 NOTE — ED Notes (Signed)
Pt requesting folic acid

## 2021-01-27 NOTE — Care Management (Signed)
Per [place], pt. No longer has a bed at [facility].   The following facility has open beds and the pt. Has been referred to them:   [facility]

## 2021-01-27 NOTE — ED Notes (Signed)
Pt is requesting additional medication for cold sore. States she was scheduled to receive a dupixent injection yesterday.

## 2021-01-27 NOTE — ED Notes (Signed)
Pt requesting D-vitamins

## 2021-01-27 NOTE — BH Assessment (Signed)
Pt remains in the ED due to suicidal ideation and despondency.  Pt was reassessed this AM.  She continues to endorse suicidal ideation and depression.  Mood was reported as depressed, and affect was flat.  Recommend continued inpatient.

## 2021-01-27 NOTE — BH Assessment (Signed)
Per Binnie Rail, [facility]-- Pt has been accepted to [facility] under the service of Dr. Jola Babinski pending COVID test. Bed will be available after 0800. Notified Dr. Joene Libra and Junior Tami Ribas, RN of acceptance.   Pamalee Leyden, [facility], [facility] Triage Specialist [phone]

## 2021-01-28 ENCOUNTER — Encounter (HOSPITAL_COMMUNITY): Payer: Self-pay | Admitting: Registered Nurse

## 2021-01-28 DIAGNOSIS — F332 Major depressive disorder, recurrent severe without psychotic features: Secondary | ICD-10-CM | POA: Diagnosis not present

## 2021-01-28 MED ORDER — VALACYCLOVIR HCL 500 MG PO TABS
1000.0000 mg | ORAL_TABLET | Freq: Two times a day (BID) | ORAL | Status: DC
  Administered 2021-01-28 – 2021-01-30 (×4): 1000 mg via ORAL
  Filled 2021-01-28 (×4): qty 2

## 2021-01-28 MED ORDER — FOLIC ACID 1 MG PO TABS
1.0000 mg | ORAL_TABLET | Freq: Every day | ORAL | Status: DC
  Administered 2021-01-28 – 2021-01-30 (×3): 1 mg via ORAL
  Filled 2021-01-28 (×3): qty 1

## 2021-01-28 MED ORDER — VITAMIN D (ERGOCALCIFEROL) 1.25 MG (50000 UNIT) PO CAPS
50000.0000 [IU] | ORAL_CAPSULE | ORAL | Status: DC
  Administered 2021-01-28: 50000 [IU] via ORAL
  Filled 2021-01-28: qty 1

## 2021-01-28 NOTE — Consult Note (Signed)
  Patient location: [facility] ED   Leslie Sanders, 45 y.o., female patient seen face to face by this provider, consulted with Dr. Nelly Rout; and chart reviewed on 01/28/21.  On evaluation Leslie Sanders reports that she did try to commit suicide.  States she was "drinking and then I was going to take pills."  Patient states that her stressors are "Chronic pain and suffering, surgeries that didn't work, and just feeling overwhelmed."  Patient states that she lives alone and has no support.  Patient denies homicidal ideation, psychosis, and paranoia.  Patient reports that she does have a prior history of suicide attempt via over dose.  Patient has psychiatric services and is suppose to take psychotropic medications but states she has been off of her medications for about 3 weeks.  History of self harm by scratching herself. Patient is unable to contract for safety.    Medication management:  Restarted on home psychotropics Buspar 5 mg Bid Cymbalta 60 mg daily Vistaril 50 mg Q 8 hrs prn changed to 25 mg Q 6 hr prn Gabapentin 300 mg Tid Ativan detox protocol has also started for patient  Disposition: Recommend psychiatric Inpatient admission when medically cleared.   Geriann Lafont B. Khadijah Mastrianni, NP

## 2021-01-28 NOTE — BH Assessment (Addendum)
Per Assunta Found, NP, this voluntary pt requires psychiatric hospitalization at this time.  The following facilities have been contacted to seek placement for this pt, with results as noted:  Beds available, information sent, decision pending: Beckie Busing [facility]  Previously referred, decision pending: Simone Curia [facility]  At capacity: [facility] [place] [facility] [place]   If this voluntary pt is accepted to a facility, please discuss disposition with pt to be sure that she agrees to the plan.  If a facility agrees to accept pt and the plan changes in any way please call the facility to inform them of the change.  Final disposition is pending as of this writing.  Doylene Canning, [place] Behavioral Health Coordinator [phone]

## 2021-01-29 ENCOUNTER — Encounter (HOSPITAL_COMMUNITY): Payer: Self-pay | Admitting: Registered Nurse

## 2021-01-29 DIAGNOSIS — R45851 Suicidal ideations: Secondary | ICD-10-CM

## 2021-01-29 DIAGNOSIS — F1020 Alcohol dependence, uncomplicated: Secondary | ICD-10-CM | POA: Diagnosis not present

## 2021-01-29 DIAGNOSIS — F332 Major depressive disorder, recurrent severe without psychotic features: Secondary | ICD-10-CM | POA: Diagnosis not present

## 2021-01-29 LAB — RESP PANEL BY RT-PCR (FLU A&B, COVID) ARPGX2
Influenza A by PCR: NEGATIVE
Influenza B by PCR: NEGATIVE
SARS Coronavirus 2 by RT PCR: NEGATIVE

## 2021-01-29 MED ORDER — CARIPRAZINE HCL 1.5 MG PO CAPS
1.5000 mg | ORAL_CAPSULE | Freq: Every day | ORAL | Status: DC
Start: 2021-01-29 — End: 2021-01-30
  Administered 2021-01-29 – 2021-01-30 (×2): 1.5 mg via ORAL
  Filled 2021-01-29 (×2): qty 1

## 2021-01-29 MED ORDER — BUSPIRONE HCL 5 MG PO TABS
7.5000 mg | ORAL_TABLET | Freq: Three times a day (TID) | ORAL | Status: DC
  Administered 2021-01-29 – 2021-01-30 (×4): 7.5 mg via ORAL
  Filled 2021-01-29 (×6): qty 1.5

## 2021-01-29 MED ORDER — TRAMADOL HCL 50 MG PO TABS
50.0000 mg | ORAL_TABLET | Freq: Once | ORAL | Status: AC
  Administered 2021-01-29: 50 mg via ORAL
  Filled 2021-01-29: qty 1

## 2021-01-29 NOTE — Consult Note (Signed)
Telepsych Consultation   Reason for Consult:  Suicidal ideation Referring Physician:  Lorre Nick, MD Location of Patient: [facility] ED Location of Provider: Other: [facility]  Patient Identification: Leslie Sanders MRN:  [id number] Principal Diagnosis: Major depressive disorder, recurrent severe without psychotic features (HCC) Diagnosis:  Principal Problem:   Major depressive disorder, recurrent severe without psychotic features (HCC) Active Problems:   Alcohol use disorder, moderate, dependence (HCC)   Total Time spent with patient: 30 minutes  Subjective:   Leslie Sanders is a 45 y.o. female patient admitted to [facility] ED after presenting with complaints of suicidal ideation  Per EDP Provider None 01/25/21: HPI:  45 year old female with a history of anemia, B12 deficiency, bipolar 1 disorder, depression, PTSD, gastric bypass presents to the ER for suicidal ideation. Patient is tearful on arrival to the ER, states that she does not want to live anymore. States that she is in constant pain and that her pain medicines and the surgeries that she has had in the past have not helped. She does have chronic opioids which she states that she plans on taking to overdose as she states that she wants to die but does not want to die "in pain". She denies any homicidal ideations, no visual or auditory hallucinations but states that she has very vivid dreams and several weeks ago had been clawing at her arms and her sleep due to these dreams. She states that she uses marijuana, normally drinks wine. Last alcoholic beverage was yesterday, states that she drank a bottle of red wine. She has no physical complaints at this time     .  HPI:  Leslie Sanders, 45 y.o., female patient reassessed via tele health by this provider, consulted with Dr. Nelly Rout; and chart reviewed on 01/29/21.  On evaluation Leslie Sanders continues to endorse suicidal ideation reporting her main stressor as not wanting to live anymore related  to her chronic pain.  Patient states she has been on multiple medications and current medications are not working "That's why I stopped taking them."  Patient also states that she was having hallucinations and some paranoia; but not currently.  Patient does state that she has been having bad dreams and wakes scratching herself.    Patient states she has to use a cane to assist with ambulation but the last time she was admitted to a psychiatric hospital she was able to [place] a wheel chair.   During evaluation Leslie Sanders is sitting up in bed eating breakfast in no acute distress.  She is alert, oriented x 4, calm and cooperative.  Her mood is depressed with flat affect.  She does not appear to be responding to internal/external stimuli or delusional thoughts.  Patient denies homicidal ideation, psychosis, and paranoia.  Patient answered question appropriately.  Past Psychiatric History: MDD, PTSD, Anxiety, Alcohol use disorder  Risk to Self:  Yes Risk to Others:  No Prior Inpatient Therapy:  Yes Prior Outpatient Therapy:  Yes  Past Medical History:  Past Medical History:  Diagnosis Date  . Anemia    (a) chronic blood loss hx of menorrhagia s/p endometrial biopsy 04/2015 (b) iron deficient  . Anxiety   . B12 deficiency   . Bipolar 1 disorder (HCC)   . Cervical spinal stenosis   . Cervical spondylosis without myelopathy 07/08/2016  . Chronic pain of right thumb 04/28/2018  . Depression   . GERD (gastroesophageal reflux disease)   . Hemorrhoids    Pt with  constipation secondary to iron supplement. Pt has had rectal bleed secondary to internal hemorrhoids. Pt is s/p EGD/colonoscopy 05/2012  . 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 Migraine   . PONV (postoperative nausea and vomiting)   . PTSD (post-traumatic stress disorder)   . Seasonal allergies   . Vitamin E deficiency   . Zinc deficiency     Past Surgical History:  Procedure Laterality Date  . ABDOMINAL HYSTERECTOMY    .  BREAST LUMPECTOMY Bilateral   . BREAST REDUCTION SURGERY    . Carpal Metacarpal Arthroplasty Right 05/13/2019  . CHOLECYSTECTOMY  1990  . HAND SURGERY    . HYSTERECTOMY ABDOMINAL WITH SALPINGECTOMY  11/2017  . POSTERIOR CERVICAL LAMINECTOMY Bilateral 05/23/2019  . RIGHT/LEFT HEART CATH AND CORONARY ANGIOGRAPHY N/A 01/13/2020   Procedure: RIGHT/LEFT HEART CATH AND CORONARY ANGIOGRAPHY;  Surgeon: Lyn RecordsSmith, Henry W, MD;  Location: [facility] INVASIVE [facility];  Service: Cardiovascular;  Laterality: N/A;  . ROUX-EN-Y GASTRIC BYPASS  2003   in [place]  . SPINAL FUSION  05/23/2019   Family History:  Family History  Problem Relation Age of Onset  . Schizophrenia Maternal Aunt   . ADD / ADHD Son   . ODD Son   . Breast cancer Mother   . Arthritis/Rheumatoid Mother   . Lupus Mother   . HIV Father   . Rheum arthritis Maternal Grandmother   . COPD Maternal Grandmother   . COPD Paternal Grandmother    Family Psychiatric  History: See above Social History:  Social History   Substance and Sexual Activity  Alcohol Use Not Currently  . Alcohol/week: 28.0 standard drinks  . Types: 28 Cans of beer per week   Comment: Nothing in 4 years     Social History   Substance and Sexual Activity  Drug Use No    Social History   Socioeconomic History  . Marital status: Divorced    Spouse name: Not on file  . Number of children: Not on file  . Years of education: Not on file  . Highest education level: Not on file  Occupational History  . Not on file  Tobacco Use  . Smoking status: Former Smoker    Packs/day: 2.00    Years: 23.00    Pack years: 46.00    Types: Cigarettes    Quit date: 12/08/2018    Years since quitting: 2.1  . Smokeless tobacco: Never Used  Vaping Use  . Vaping Use: Never used  Substance and Sexual Activity  . Alcohol use: Not Currently    Alcohol/week: 28.0 standard drinks    Types: 28 Cans of beer per week    Comment: Nothing in 4 years  . Drug use: No  . Sexual activity: Not  Currently  Other Topics Concern  . Not on file  Social History Narrative  . Not on file   Social Determinants of Health   Financial Resource Strain: Not on file  Food Insecurity: Not on file  Transportation Needs: Not on file  Physical Activity: Not on file  Stress: Not on file  Social Connections: Not on file   Additional Social History:    Allergies:   Allergies  Allergen Reactions  . Tylenol [Acetaminophen] Anaphylaxis    Per patient  . Adhesive  [Tape] Rash  . Hydrocodone-Acetaminophen Other (See Comments) and Nausea And Vomiting  . Nsaids Other (See Comments)    S/p bariatric surgery; increased risk of ulcers, gastritis and cancer  . Latex Rash  Labs: No results found for this or any previous visit (from the past 48 hour(s)).  Medications:  Current Facility-Administered Medications  Medication Dose Route Frequency Provider Last Rate Last Admin  . albuterol (VENTOLIN HFA) 108 (90 Base) MCG/ACT inhaler 1 puff  1 puff Inhalation Q6H PRN Charm Rings, NP      . alum & mag hydroxide-simeth (MAALOX/MYLANTA) 200-200-20 MG/5ML suspension 30 mL  30 mL Oral Once McDonald, Mia A, PA-C       And  . lidocaine (XYLOCAINE) 2 % viscous mouth solution 15 mL  15 mL Oral Once McDonald, Mia A, PA-C      . azelastine (ASTELIN) 0.1 % nasal spray 2 spray  2 spray Each Nare BID Charm Rings, NP   2 spray at 01/29/21 0957  . busPIRone (BUSPAR) tablet 7.5 mg  7.5 mg Oral TID Rankin, Shuvon B, NP      . cariprazine (VRAYLAR) capsule 1.5 mg  1.5 mg Oral Daily Rankin, Shuvon B, NP      . DULoxetine (CYMBALTA) DR capsule 20 mg  20 mg Oral Daily Charm Rings, NP   20 mg at 01/29/21 1610  . ferrous sulfate tablet 325 mg  325 mg Oral BID WC Charm Rings, NP   325 mg at 01/29/21 0900  . fluticasone (FLONASE) 50 MCG/ACT nasal spray 2 spray  2 spray Each Nare Daily Charm Rings, NP   2 spray at 01/29/21 0957  . folic acid (FOLVITE) tablet 1 mg  1 mg Oral Daily Benjiman Core, MD   1  mg at 01/29/21 9604  . furosemide (LASIX) tablet 40 mg  40 mg Oral Daily Charm Rings, NP   40 mg at 01/29/21 1002  . gabapentin (NEURONTIN) capsule 300 mg  300 mg Oral TID Charm Rings, NP   300 mg at 01/29/21 0959  . glycopyrrolate (ROBINUL) tablet 2 mg  2 mg Oral BID Charm Rings, NP   2 mg at 01/29/21 5409  . hydrOXYzine (ATARAX/VISTARIL) tablet 25 mg  25 mg Oral Q6H PRN Charm Rings, NP   25 mg at 01/26/21 1901  . loperamide (IMODIUM) capsule 2-4 mg  2-4 mg Oral PRN Charm Rings, NP      . LORazepam (ATIVAN) tablet 1 mg  1 mg Oral Q6H PRN Charm Rings, NP      . midodrine (PROAMATINE) tablet 5 mg  5 mg Oral TID WC Charm Rings, NP   5 mg at 01/29/21 0900  . mometasone-formoterol (DULERA) 200-5 MCG/ACT inhaler 2 puff  2 puff Inhalation BID Charm Rings, NP   2 puff at 01/28/21 2048   And  . umeclidinium bromide (INCRUSE ELLIPTA) 62.5 MCG/INH 1 puff  1 puff Inhalation Daily Charm Rings, NP   1 puff at 01/27/21 0848  . multivitamin with minerals tablet 1 tablet  1 tablet Oral Daily Charm Rings, NP   1 tablet at 01/29/21 [phone]  . ondansetron (ZOFRAN-ODT) disintegrating tablet 4 mg  4 mg Oral Q6H PRN Charm Rings, NP   4 mg at 01/27/21 1012  . pantoprazole (PROTONIX) EC tablet 40 mg  40 mg Oral Daily Charm Rings, NP   40 mg at 01/29/21 1478  . thiamine tablet 100 mg  100 mg Oral Daily Charm Rings, NP   100 mg at 01/29/21 2956  . valACYclovir (VALTREX) tablet 1,000 mg  1,000 mg Oral BID Benjiman Core, MD   1,000  mg at 01/29/21 0959  . Vitamin D (Ergocalciferol) (DRISDOL) capsule 50,000 Units  50,000 Units Oral Q Eustace Pen, MD   50,000 Units at 01/28/21 1900   Current Outpatient Medications  Medication Sig Dispense Refill  . albuterol (PROVENTIL) (2.5 MG/3ML) 0.083% nebulizer solution Take 3 mLs (2.5 mg total) by nebulization every 6 (six) hours as needed for wheezing or shortness of breath (Inhale 86ml every 6 hours and as needed.). 360 mL 1   . albuterol (VENTOLIN HFA) 108 (90 Base) MCG/ACT inhaler INHALE 1 PUFF INTO THE LUNGS EVERY 6 HOURS AS NEEDED FOR WHEEZING OR SHORTNESS OF BREATH (Patient taking differently: Inhale 2 puffs into the lungs every 6 (six) hours as needed for wheezing or shortness of breath.) 6.7 g 3  . azelastine (ASTELIN) 0.1 % nasal spray Place 2 sprays into both nostrils 2 (two) times daily. Use in each nostril as directed (Patient taking differently: Place 2 sprays into both nostrils 2 (two) times daily.) 30 mL 12  . Budeson-Glycopyrrol-Formoterol (BREZTRI AEROSPHERE) 160-9-4.8 MCG/ACT AERO Inhale 2 puffs into the lungs 2 (two) times daily. 10.7 g 11  . buprenorphine (BUTRANS) 20 MCG/HR PTWK Place 1 patch onto the skin once a week. On both shoulders    . busPIRone (BUSPAR) 5 MG tablet Take 5 mg by mouth 2 (two) times daily.    . cyanocobalamin (,VITAMIN B-12,) 1000 MCG/ML injection Inject 1 mL into the muscle every 30 (thirty) days.    . diclofenac Sodium (VOLTAREN) 1 % GEL Apply 2 g topically daily as needed.    . docusate sodium (COLACE) 100 MG capsule Take 100 mg by mouth daily.    . DULoxetine (CYMBALTA) 60 MG capsule Take 60 mg by mouth daily.     . DUPIXENT 300 MG/2ML prefilled syringe INJECT 300 MG INTO THE SKIN EVERY 14 (FOURTEEN) DAYS. (Patient taking differently: Inject 300 mg into the skin every 14 (fourteen) days.) 4 mL 5  . ferrous sulfate 325 (65 FE) MG tablet Take 1 tablet (325 mg total) by mouth 2 (two) times daily with a meal. 60 tablet 0  . fluticasone (FLONASE) 50 MCG/ACT nasal spray Place 2 sprays into both nostrils daily. 16 g 11  . folic acid (FOLVITE) 1 MG tablet Take 1 mg by mouth daily.     . furosemide (LASIX) 40 MG tablet Take 1 tablet (40 mg total) by mouth daily. 90 tablet 3  . glycopyrrolate (ROBINUL) 2 MG tablet Take 1 tablet (2 mg total) by mouth 2 (two) times daily. 180 tablet 3  . hydrOXYzine (VISTARIL) 50 MG capsule Take 50 mg by mouth every 8 (eight) hours as needed for anxiety  or itching.    . montelukast (SINGULAIR) 10 MG tablet Take 10 mg by mouth daily as needed.    Marland Kitchen omeprazole (PRILOSEC) 40 MG capsule Take 40 mg by mouth daily.    . pregabalin (LYRICA) 200 MG capsule Take 200 mg by mouth 2 (two) times daily.    . valACYclovir (VALTREX) 1000 MG tablet Take 1,000 mg by mouth 2 (two) times daily.    . Vitamin D, Ergocalciferol, (DRISDOL) 1.25 MG (50000 UT) CAPS capsule Take 50,000 Units by mouth every Monday.     . hydrOXYzine (ATARAX/VISTARIL) 25 MG tablet Take 1 tablet (25 mg total) by mouth every 6 (six) hours as needed for anxiety. (Patient not taking: No sig reported) 12 tablet 0  . midodrine (PROAMATINE) 5 MG tablet Take 1 tablet (5 mg total) by mouth 3 (three)  times daily with meals. (Patient not taking: No sig reported) 270 tablet 3  . omeprazole (PRILOSEC) 40 MG capsule TAKE 1 CAPSULE(40 MG) BY MOUTH DAILY (Patient not taking: No sig reported) 90 capsule 3  . pantoprazole (PROTONIX) 40 MG tablet Take 1 tablet (40 mg total) by mouth daily. (Patient not taking: Reported on 01/26/2021) 30 tablet 11  . propranolol (INDERAL) 20 MG tablet TAKE 1 TABLET(20 MG) BY MOUTH THREE TO FOUR TIMES DAILY AS NEEDED (Patient not taking: No sig reported) 180 tablet 3    Musculoskeletal: Strength & Muscle Tone: within normal limits Gait & Station: Uses a cane to assist with ambulation Patient leans: N/A  Psychiatric Specialty Exam: Physical Exam Vitals and nursing note reviewed. Chaperone present: Sitter at bedside.  Constitutional:      General: She is not in acute distress.    Appearance: Normal appearance. She is not ill-appearing.  Cardiovascular:     Rate and Rhythm: Normal rate.  Pulmonary:     Effort: Pulmonary effort is normal.  Musculoskeletal:        General: Normal range of motion.     Cervical back: Normal range of motion.  Neurological:     Mental Status: She is alert and oriented to person, place, and time.  Psychiatric:        Attention and  Perception: Attention and perception normal. She does not perceive auditory or visual hallucinations.        Mood and Affect: Mood is depressed. Affect is flat.        Speech: Speech normal.        Behavior: Behavior normal. Behavior is cooperative.        Thought Content: Thought content is not paranoid or delusional. Thought content includes suicidal ideation. Thought content does not include homicidal ideation. Thought content includes suicidal plan.        Cognition and Memory: Cognition and memory normal.        Judgment: Judgment is impulsive.     Review of Systems  Constitutional: Negative.   HENT: Negative.   Eyes: Negative.   Respiratory: Negative.   Cardiovascular: Negative.   Gastrointestinal: Negative.   Genitourinary: Negative.   Musculoskeletal: Positive for back pain and myalgias.  Skin: Negative.   Neurological: Negative.   Hematological: Negative.   Psychiatric/Behavioral: Positive for self-injury, sleep disturbance and suicidal ideas. Hallucinations: States she was having some hallucinations right after she stopped taking her medications but not now; also reports that she is having bad dreams and waking up scratching herself  The patient is nervous/anxious.     Blood pressure 107/73, pulse 82, temperature 97.7 F (36.5 C), temperature source Oral, resp. rate 16, height  (1.575 m), weight 55.8 kg, last menstrual period 10/07/2016, SpO2 98 %.Body mass index is 22.5 kg/m.  General Appearance: Casual  Eye Contact:  Good  Speech:  Clear and Coherent and Normal Rate  Volume:  Normal  Mood:  Depressed and Hopeless  Affect:  Depressed and Flat  Thought Process:  Coherent, Goal Directed and Descriptions of Associations: Intact  Orientation:  Full (Time, Place, and Person)  Thought Content:  WDL  Suicidal Thoughts:  Yes.  with intent/plan  Homicidal Thoughts:  No  Memory:  Immediate;   Good Recent;   Good  Judgement:  Intact  Insight:  Lacking  Psychomotor  Activity:  Normal  Concentration:  Concentration: Good and Attention Span: Good  Recall:  Good  Fund of Knowledge:  Good  Language:  Good  Akathisia:  No  Handed:  Right  AIMS (if indicated):     Assets:  Communication Skills Desire for Improvement Housing  ADL's:  Intact  Cognition:  WNL  Sleep:      Treatment Plan Summary: Daily contact with patient to assess and evaluate symptoms and progress in treatment, Medication management and Plan Inpatient psychiatric treatment  Psychotropic Medication Management: Increased Buspar to 7.5 mg Tid Added Vraylar 1.5 mg daily Continued Cymbalta 60 mg daily and Gabapentin 300 mg Tid (which is more likely prescribed for patients pain) but will also help with depression and anxiety  Disposition: Recommend psychiatric Inpatient admission when medically cleared.  This service was provided via telemedicine using a 2-way, interactive audio and video technology.  Names of all persons participating in this telemedicine service and their role in this encounter. Name: Assunta Found Role: NP  Name: Dr. Nelly Rout Role: Psychiatrist  Name: Leslie Sanders Role: Patient   Name: Dr. Particia Nearing Role: Lucien Mons EDP sent secure messing informing:  Patient continues to need inpatient psychiatric treatment.  Mood disorder bed     Shuvon Rankin, NP 01/29/2021 10:17 AM

## 2021-01-29 NOTE — BH Assessment (Addendum)
BHH Assessment Progress Note  Per Shuvon Rankin, NP, this pt continues to require psychiatric hospitalization at this time.  The following facilities have been contacted to seek placement for this pt, with results as noted:  Beds available, information sent, decision pending: [facility] [address] [place]  Declined: [facility] (due to mobility problems) [facility] (due to mobility problems)  Unable to reach: [place] (left message at 14:27)  At capacity: [place]  If this voluntary pt is accepted to a facility, please discuss disposition with pt to be sure that she agrees to the plan.  If a facility agrees to accept pt and the plan changes in any way please call the facility to inform them of the change.  Final disposition is pending as of this writing.  Doylene Canning, [place] Behavioral Health Coordinator [phone]

## 2021-01-29 NOTE — Progress Notes (Signed)
01/29/2021  1635  [facility] called stating they will be able to accept patient. Bed will be available tomorrow 01/30/2021.

## 2021-01-30 DIAGNOSIS — F332 Major depressive disorder, recurrent severe without psychotic features: Secondary | ICD-10-CM | POA: Diagnosis not present

## 2021-01-30 MED ORDER — ONDANSETRON 4 MG PO TBDP
4.0000 mg | ORAL_TABLET | Freq: Four times a day (QID) | ORAL | Status: DC | PRN
  Administered 2021-01-30: 4 mg via ORAL
  Filled 2021-01-30: qty 1

## 2021-01-30 MED ORDER — DOCUSATE SODIUM 100 MG PO CAPS
100.0000 mg | ORAL_CAPSULE | Freq: Two times a day (BID) | ORAL | Status: DC
  Administered 2021-01-30: 100 mg via ORAL
  Filled 2021-01-30: qty 1

## 2021-01-30 NOTE — ED Provider Notes (Signed)
Emergency Medicine Observation Re-evaluation Note  Leslie Sanders is a 45 y.o. female, seen on rounds today.  Pt initially presented to the ED for complaints of Medical Clearance Currently, the patient is awaiting psychiatric admission.  Physical Exam  BP 107/73 (BP Location: Left Arm)   Pulse 72   Temp 98.1 F (36.7 C) (Oral)   Resp 16   Ht 5\' 2"  (1.575 m)   Wt 55.8 kg   LMP 10/07/2016   SpO2 97%   BMI 22.50 kg/m  Physical Exam General: awake, eating food Cardiac: normal HR Lungs: no increased effort Psych: depressed  ED Course / MDM  EKG:EKG Interpretation  Date/Time:  Friday January 25 2021 21:13:07 EST Ventricular Rate:  87 PR Interval:  108 QRS Duration: 70 QT Interval:  378 QTC Calculation: 454 R Axis:   45 Text Interpretation: Sinus rhythm with short PR Otherwise normal ECG When compared with ECG of EARLIER SAME DATE No significant change was found Confirmed by [id number] (Dione Booze) on 01/27/2021 11:35:15 PM    I have reviewed the labs performed to date as well as medications administered while in observation.  Recent changes in the last 24 hours include psychiatric medication adjustment.  Plan  Current plan is for admission to [facility]. I will IVC her at psychiatry recommendations Patient is under full IVC at this time.   [facility], MD 01/30/21 1028

## 2021-01-30 NOTE — ED Notes (Signed)
Returned pt's 3 belonging bags and blue rolling suitcase. Gave her the three belonging bags for her to confirm all her belongings are returned. She confirms all belongs are there. Gave belonging bags and blue rolling suitcase to [employer].

## 2021-01-30 NOTE — BH Assessment (Addendum)
BHH Assessment Progress Note  Per Shuvon Rankin, NP, this voluntary pt requires psychiatric hospitalization at this time.  At 09:52 this Clinical research associate spoke to [place]red at [facility]  She reports that pt has been accepted to their facility by Dr Forrestine Him to [address] [place].  [place] concurs with this decision.  Plan was discussed with pt and she has refused transfer, however, per EDP Pricilla Loveless, MD, pt meets criteria for IVC which he has initiated.  IVC documents have been faxed to [facility], and at [facility] confirms receipt.  She has since faxed Findings and Custody Order to this Clinical research associate.  At 11:15 I spoke to [facility] Officer Timothy Lasso, who completed Return of Service.  IVC documents were then faxed to [facility], and at 11:45 Gaynelle Adu confirmed receipt.  Dr Criss Alvine and pt's nurse, Florentina Addison, have been notified, and Florentina Addison agrees to call report to [phone].  Pt is to be transported via [facility].  Doylene Canning Behavioral Health Coordinator [phone]

## 2021-02-08 ENCOUNTER — Telehealth: Payer: Self-pay | Admitting: Internal Medicine

## 2021-02-08 NOTE — Telephone Encounter (Signed)
Spoke with Patient. Patient has OV with Dr. Celine Mans at 1100 02/12/21. Patient requested Dupixent injection same day.  Advised Patient I will be with provider until around 11-1214.  I told Patient I would give injection after clinic if she could wait, or possible during OV. Patient stated she could wait for me to finish clinic for injection if needed. Patient scheduled 1200 02/12/21 for injection.

## 2021-02-12 ENCOUNTER — Ambulatory Visit (INDEPENDENT_AMBULATORY_CARE_PROVIDER_SITE_OTHER): Payer: Medicare HMO | Admitting: Internal Medicine

## 2021-02-12 ENCOUNTER — Other Ambulatory Visit: Payer: Self-pay

## 2021-02-12 ENCOUNTER — Ambulatory Visit: Payer: Medicare HMO

## 2021-02-12 ENCOUNTER — Encounter: Payer: Self-pay | Admitting: Internal Medicine

## 2021-02-12 VITALS — BP 90/60 | HR 91 | Temp 97.1°F | Ht 62.0 in | Wt 133.4 lb

## 2021-02-12 DIAGNOSIS — R0600 Dyspnea, unspecified: Secondary | ICD-10-CM | POA: Diagnosis not present

## 2021-02-12 DIAGNOSIS — J4550 Severe persistent asthma, uncomplicated: Secondary | ICD-10-CM | POA: Diagnosis not present

## 2021-02-12 DIAGNOSIS — R0609 Other forms of dyspnea: Secondary | ICD-10-CM

## 2021-02-12 MED ORDER — DUPILUMAB 300 MG/2ML ~~LOC~~ SOSY
300.0000 mg | PREFILLED_SYRINGE | Freq: Once | SUBCUTANEOUS | Status: AC
  Administered 2021-02-12: 300 mg via SUBCUTANEOUS

## 2021-02-12 NOTE — Progress Notes (Signed)
Leslie Sanders    [id number]    [birth year]  Primary Care Physician:Sundaragiri, Oren Beckmann, MD Date of Appointment: 02/12/2021 Established Patient Visit  Chief complaint:   Chief Complaint  Patient presents with  . New Patient (Initial Visit)    No concerns     HPI: Leslie Sanders is a 45 y.o. 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 Previously seen by Dr. Carlis Abbott. She has a history of childhood asthma. Gotten worse as an adult. Last felt well controlled as a teenager.   Interval Updates: Feels that breathing isn't doing well. Gets winded easily and even at rest while watching tv.  She uses her sons prednisone - used it last month for one day, usually uses for 2-3 days. She has difficulty distinguishing when she is having panic attacks vs when her asthma symptoms are acting up.   Current Regimen: Breztri BID, singulair, dupixent (since July 2021) flonase, albuterol Asthma Triggers: exertion, pollen, dust, fragrances Exacerbations in the last year: taken prednisone 6 times in the last 6 months.  History of hospitalization or intubation: never  Allergy Testing: serum IgE 01/2020 was 20 GERD: yes not on any therapy Allergic Rhinitis: yes, she takes flonase, singulair, not on any antihistamines ACT:  Asthma Control Test ACT Total Score  02/12/2021 10  08/15/2020 9  10/31/2019 7   FeNO: never had  I have reviewed the patient's family social and past medical history and updated as appropriate.   Past Medical History:  Diagnosis Date  . Anemia    (a) chronic blood loss hx of menorrhagia s/p endometrial biopsy 04/2015 (b) iron deficient  . Anxiety   . B12 deficiency   . Bipolar 1 disorder ([place])   . Cervical spinal stenosis   . Cervical spondylosis without myelopathy 07/08/2016  . Chronic pain of right thumb 04/28/2018  . Depression   . GERD (gastroesophageal reflux disease)   . Hemorrhoids    Pt with constipation secondary to iron supplement. Pt  has had rectal bleed secondary to internal hemorrhoids. Pt is s/p EGD/colonoscopy 05/2012  . 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 Migraine   . PONV (postoperative nausea and vomiting)   . PTSD (post-traumatic stress disorder)   . Seasonal allergies   . Vitamin E deficiency   . Zinc deficiency     Past Surgical History:  Procedure Laterality Date  . ABDOMINAL HYSTERECTOMY    . BREAST LUMPECTOMY Bilateral   . BREAST REDUCTION SURGERY    . Carpal Metacarpal Arthroplasty Right 05/13/2019  . CHOLECYSTECTOMY  1990  . HAND SURGERY    . HYSTERECTOMY ABDOMINAL WITH SALPINGECTOMY  11/2017  . POSTERIOR CERVICAL LAMINECTOMY Bilateral 05/23/2019  . RIGHT/LEFT HEART CATH AND CORONARY ANGIOGRAPHY N/A 01/13/2020   Procedure: RIGHT/LEFT HEART CATH AND CORONARY ANGIOGRAPHY;  Surgeon: Belva Crome, MD;  Location: [facility];  Service: Cardiovascular;  Laterality: N/A;  . ROUX-EN-Y GASTRIC BYPASS  2003   in [place]  05/23/2019    Family History  Problem Relation Age of Onset  . Schizophrenia Maternal Aunt   . ADD / ADHD Son   . ODD Son   . Asthma Son   . Breast cancer Mother   . Arthritis/Rheumatoid Mother   . Lupus Mother   . HIV Father   . Rheum arthritis Maternal Grandmother   . COPD Maternal Grandmother   . COPD Paternal Grandmother  Social History   Occupational History  . Not on file  Tobacco Use  . Smoking status: Former Smoker    Packs/day: 1.00    Years: 15.00    Pack years: 15.00    Types: Cigarettes    Quit date: 12/08/2018    Years since quitting: 2.1  . Smokeless tobacco: Never Used  Vaping Use  . Vaping Use: Never used  Substance and Sexual Activity  . Alcohol use: Not Currently    Alcohol/week: 28.0 standard drinks    Types: 28 Cans of beer per week    Comment: Nothing in 4 years  . Drug use: No  . Sexual activity: Not Currently     Physical Exam: Blood pressure 90/60, pulse 91, temperature (!) 97.1 F (36.2 C), temperature  source Temporal, height '5\' 2"'  (1.575 m), weight 133 lb 6.4 oz (60.5 kg), last menstrual period 10/07/2016, SpO2 99 %.  Gen:      No acute distress, ambulates with walker Lungs:    No increased respiratory effort, symmetric chest wall excursion, clear to auscultation bilaterally, no wheezes or crackles CV:         Regular rate and rhythm; no murmurs, rubs, or gallops.  No pedal edema   Data Reviewed: Imaging: I have personally reviewed the chest xray 01/2021 which shows no acute pulmonary process.  Sniff test 11/2019 shows normal diarphgram movement   PFTs:  PFT Results Latest Ref Rng & Units 05/28/2020 03/14/2020 03/14/2020 11/25/2019  FVC-Pre L 2.27 2.11 2.11 2.10  FVC-Predicted Pre % 81 75 75 75  FVC-Post L 2.41 2.35 2.35 2.24  FVC-Predicted Post % 86 84 84 80  Pre FEV1/FVC % % 63 66 66 68  Post FEV1/FCV % % 70 74 74 75  FEV1-Pre L 1.43 1.38 1.38 1.42  FEV1-Predicted Pre % 62 60 60 62  FEV1-Post L 1.69 1.73 1.73 1.67  DLCO uncorrected ml/min/mmHg 16.47 16.53 16.53 14.36  DLCO UNC% % 81 82 82 71  DLCO corrected ml/min/mmHg 16.47 16.53 16.53 -  DLCO COR %Predicted % 81 82 82 -  DLVA Predicted % 106 103 103 89  TLC L 3.94 4.08 4.08 4.03  TLC % Predicted % 82 85 85 84  RV % Predicted % 113 119 119 123   I have personally reviewed the patient's PFTs and and they show moderate airflow limitation with a positive BD response.   Labs:  Immunization status: Immunization History  Administered Date(s) Administered  . Influenza,inj,Quad PF,6+ Mos 12/21/2017, 08/31/2019  . Influenza,inj,Quad PF,6-35 Mos 08/20/2020  . Influenza-Unspecified 08/23/2015, 12/21/2017, 10/08/2018, 08/31/2019  . MMR 07/24/2015  . PFIZER(Purple Top)SARS-COV-2 Vaccination 03/02/2020, 03/27/2020, 07/31/2020  . PPD Test 03/14/2016  . Pneumococcal Conjugate-13 10/31/2019  . Pneumococcal Polysaccharide-23 12/21/2017  . Tdap 07/24/2015    Assessment:  Severe persistent asthma copd overlap syndrome Panic disorder  with depression Chronic allergic rhinitis  Plan/Recommendations: Continue breztri, Continue albuterol prn. Continue dupilumab injections Continue singulair and flonase for rhinitis. Will try cetirizine to help with her chronic rhinitis which is not well controlled.  Will refer for pulmonary rehabilitation at [facility].  Will get her a nebulizer machine with albuterol nebulizer treatments. We will need to work on helping her understand the difference between anxiety and her asthma/copd acting up. She also has a. Fib and is nervous about taking too much albuterol Exhaled NO and Spiro at next visit   I spent 30 minutes on 02/12/2021 in care of this patient including face to face time and non-face to  face time spent charting, review of outside records, and coordination of care.  Return to Care: Return in about 3 months (around 05/15/2021).   Lenice Llamas, MD Pulmonary and [place]

## 2021-02-13 ENCOUNTER — Other Ambulatory Visit (HOSPITAL_COMMUNITY): Payer: Self-pay | Admitting: Pharmacy Technician

## 2021-02-18 ENCOUNTER — Encounter (HOSPITAL_COMMUNITY): Payer: Self-pay | Admitting: *Deleted

## 2021-02-18 NOTE — Progress Notes (Signed)
Received referral from Dr. Celine Mans for this pt to participate in pulmonary rehab with the the diagnosis of Severe persistent Asthma. Clinical review of pt follow up appt on 3/8 Pulmonary office note.  Also reviewed pt ER visits and follow up at [facility] in Care Everywhere.  Pt with extensive mental health history with involuntary commitment due to suicide ideation. Pt was scheduled with pulmonary rehab at [facility] but was at the  ER at  [facility] and did not complete this follow appt.  Pt with Covid Risk Score - 4.  Will forward to support staff for  verification of insurance eligibility/benefits.  Would like for pt to be stable with her mental health management as we do not have resources here in our department as it relates to complex psychosocial needs.   Alanson Aly, BSN Cardiac and Emergency planning/management officer

## 2021-02-19 NOTE — Telephone Encounter (Signed)
We received this mychart message. Please advise  I was wondering am I still supposed to come in on February 27, 2021 at 1:00pm to get my next injection. Because I don't see that date however this was the next date I put on my calendar when I came there on February 12, 2021 which was when I saw you and received an injection at my appointment with you. I believe the nurse may have forgotten to schedule me for the next one in March. According to the appointments she skipped a dose appointment then scheduled future appointments for April. Please assist. Thank You

## 2021-02-19 NOTE — Telephone Encounter (Signed)
[facility] has medication scheduled to deliver to infusion center on 02/26/21. Patient says she has not been scheduled yet.

## 2021-02-25 MED FILL — DUPIXENT 300 MG/2 ML SAFE S: 300 | 28 days supply | Qty: 4 | Fill #1

## 2021-02-26 ENCOUNTER — Ambulatory Visit (INDEPENDENT_AMBULATORY_CARE_PROVIDER_SITE_OTHER): Payer: Medicare HMO

## 2021-02-26 ENCOUNTER — Other Ambulatory Visit: Payer: Self-pay

## 2021-02-26 VITALS — BP 104/67 | HR 81 | Temp 98.3°F | Resp 17

## 2021-02-26 DIAGNOSIS — J4550 Severe persistent asthma, uncomplicated: Secondary | ICD-10-CM | POA: Diagnosis not present

## 2021-02-26 MED ORDER — DIPHENHYDRAMINE HCL 50 MG/ML IJ SOLN: 50.0000 mg | Freq: Once | INTRAMUSCULAR | Status: DC | PRN

## 2021-02-26 MED ORDER — ALBUTEROL SULFATE HFA 108 (90 BASE) MCG/ACT IN AERS: 2.0000 | INHALATION_SPRAY | Freq: Once | RESPIRATORY_TRACT | Status: DC | PRN

## 2021-02-26 MED ORDER — FAMOTIDINE IN NACL 20-0.9 MG/50ML-% IV SOLN: 20.0000 mg | Freq: Once | INTRAVENOUS | Status: DC | PRN

## 2021-02-26 MED ORDER — EPINEPHRINE 0.3 MG/0.3ML IJ SOAJ: 0.3000 mg | Freq: Once | INTRAMUSCULAR | Status: DC | PRN

## 2021-02-26 MED ORDER — DUPILUMAB 300 MG/2ML ~~LOC~~ SOSY
300.0000 mg | PREFILLED_SYRINGE | Freq: Once | SUBCUTANEOUS | Status: AC
  Administered 2021-02-26: 300 mg via SUBCUTANEOUS
  Filled 2021-02-26: qty 2

## 2021-02-26 MED ORDER — SODIUM CHLORIDE 0.9 % IV SOLN: Freq: Once | INTRAVENOUS | Status: DC | PRN

## 2021-02-26 MED ORDER — METHYLPREDNISOLONE SODIUM SUCC 125 MG IJ SOLR: 125.0000 mg | Freq: Once | INTRAMUSCULAR | Status: DC | PRN

## 2021-02-26 NOTE — Progress Notes (Signed)
{  Diagnosis:Asthma  Provider: Chilton Greathouse   Procedure: Injection, Medication: Dupixent, Site: subcutaneous by .Elita Dame  Discharge: Condition: Good, Destination: Home . AVS provided. by .Bronsen Serano

## 2021-02-27 ENCOUNTER — Ambulatory Visit: Payer: Medicare HMO

## 2021-03-08 ENCOUNTER — Other Ambulatory Visit (HOSPITAL_COMMUNITY): Payer: Self-pay

## 2021-03-11 NOTE — Progress Notes (Signed)
Diagnosis: Asthma  Provider:  Praveen Mannam, MD  Procedure: Injection  Dupixent (Dupilumab), Dose: 300 mg, Site: subcutaneous  Discharge: Condition: Good, Destination: Home . AVS provided to patient.   Performed by:  Tuff Clabo, RN       

## 2021-03-13 ENCOUNTER — Other Ambulatory Visit: Payer: Self-pay

## 2021-03-13 ENCOUNTER — Ambulatory Visit: Payer: Medicare HMO

## 2021-03-13 ENCOUNTER — Ambulatory Visit (INDEPENDENT_AMBULATORY_CARE_PROVIDER_SITE_OTHER): Payer: Medicare HMO

## 2021-03-13 VITALS — BP 110/66 | HR 77 | Temp 98.6°F | Resp 18

## 2021-03-13 DIAGNOSIS — J4550 Severe persistent asthma, uncomplicated: Secondary | ICD-10-CM

## 2021-03-13 MED ORDER — ALBUTEROL SULFATE HFA 108 (90 BASE) MCG/ACT IN AERS: 2.0000 | INHALATION_SPRAY | Freq: Once | RESPIRATORY_TRACT | Status: DC | PRN

## 2021-03-13 MED ORDER — DUPILUMAB 300 MG/2ML ~~LOC~~ SOSY
300.0000 mg | PREFILLED_SYRINGE | Freq: Once | SUBCUTANEOUS | Status: AC
  Administered 2021-03-13: 300 mg via SUBCUTANEOUS
  Filled 2021-03-13: qty 2

## 2021-03-13 MED ORDER — EPINEPHRINE 0.3 MG/0.3ML IJ SOAJ: 0.3000 mg | Freq: Once | INTRAMUSCULAR | Status: DC | PRN

## 2021-03-13 MED ORDER — SODIUM CHLORIDE 0.9 % IV SOLN: Freq: Once | INTRAVENOUS | Status: DC | PRN

## 2021-03-13 MED ORDER — DIPHENHYDRAMINE HCL 50 MG/ML IJ SOLN: 50.0000 mg | Freq: Once | INTRAMUSCULAR | Status: DC | PRN

## 2021-03-13 MED ORDER — FAMOTIDINE IN NACL 20-0.9 MG/50ML-% IV SOLN: 20.0000 mg | Freq: Once | INTRAVENOUS | Status: DC | PRN

## 2021-03-13 MED ORDER — METHYLPREDNISOLONE SODIUM SUCC 125 MG IJ SOLR: 125.0000 mg | Freq: Once | INTRAMUSCULAR | Status: DC | PRN

## 2021-03-13 NOTE — Progress Notes (Signed)
Diagnosis: Asthma  Provider:  Praveen Mannam, MD  Procedure: Injection  Dupixent (Dupilumab), Dose: 300 mg, Site: subcutaneous  Discharge: Condition: Good, Destination: Home . AVS provided to patient.   Performed by:  Christian Borgerding, RN        

## 2021-03-20 ENCOUNTER — Other Ambulatory Visit (HOSPITAL_COMMUNITY): Payer: Self-pay

## 2021-03-20 MED FILL — Dupilumab Subcutaneous Soln Prefilled Syringe 300 MG/2ML: SUBCUTANEOUS | 28 days supply | Qty: 4 | Fill #0 | Status: AC

## 2021-03-25 ENCOUNTER — Other Ambulatory Visit (HOSPITAL_COMMUNITY): Payer: Self-pay

## 2021-03-27 ENCOUNTER — Ambulatory Visit (INDEPENDENT_AMBULATORY_CARE_PROVIDER_SITE_OTHER): Payer: Medicare HMO

## 2021-03-27 ENCOUNTER — Other Ambulatory Visit: Payer: Self-pay

## 2021-03-27 VITALS — BP 113/74 | HR 83 | Temp 98.3°F | Resp 18

## 2021-03-27 DIAGNOSIS — J4550 Severe persistent asthma, uncomplicated: Secondary | ICD-10-CM

## 2021-03-27 MED ORDER — FAMOTIDINE IN NACL 20-0.9 MG/50ML-% IV SOLN: 20.0000 mg | Freq: Once | INTRAVENOUS | Status: DC | PRN

## 2021-03-27 MED ORDER — DIPHENHYDRAMINE HCL 50 MG/ML IJ SOLN: 50.0000 mg | Freq: Once | INTRAMUSCULAR | Status: DC | PRN

## 2021-03-27 MED ORDER — DUPILUMAB 300 MG/2ML ~~LOC~~ SOSY
300.0000 mg | PREFILLED_SYRINGE | Freq: Once | SUBCUTANEOUS | Status: AC
  Administered 2021-03-27: 300 mg via SUBCUTANEOUS
  Filled 2021-03-27: qty 2

## 2021-03-27 MED ORDER — METHYLPREDNISOLONE SODIUM SUCC 125 MG IJ SOLR: 125.0000 mg | Freq: Once | INTRAMUSCULAR | Status: DC | PRN

## 2021-03-27 MED ORDER — ALBUTEROL SULFATE HFA 108 (90 BASE) MCG/ACT IN AERS: 2.0000 | INHALATION_SPRAY | Freq: Once | RESPIRATORY_TRACT | Status: DC | PRN

## 2021-03-27 MED ORDER — EPINEPHRINE 0.3 MG/0.3ML IJ SOAJ: 0.3000 mg | Freq: Once | INTRAMUSCULAR | Status: DC | PRN

## 2021-03-27 MED ORDER — SODIUM CHLORIDE 0.9 % IV SOLN: Freq: Once | INTRAVENOUS | Status: DC | PRN

## 2021-03-27 NOTE — Progress Notes (Signed)
Diagnosis: Asthma  Provider:  Praveen Mannam, MD  Procedure: Injection  Dupixent (Dupilumab), Dose: 300 mg, Site: subcutaneous  Discharge: Condition: Good, Destination: Home . AVS provided to patient.   Performed by:  Mashayla Lavin, RN       

## 2021-03-28 ENCOUNTER — Other Ambulatory Visit: Payer: Self-pay | Admitting: Physician Assistant

## 2021-03-28 DIAGNOSIS — R002 Palpitations: Secondary | ICD-10-CM

## 2021-04-09 ENCOUNTER — Telehealth (HOSPITAL_COMMUNITY): Payer: Self-pay

## 2021-04-09 NOTE — Telephone Encounter (Signed)
Called patient to see if she is interested in the Pulmonary Rehab Program. Patient expressed interest. Explained scheduling process and went over insurance, patient verbalized understanding. Also adv pt where we are with scheduling for [facility] and that we have a backlog 1-3 months. 

## 2021-04-09 NOTE — Telephone Encounter (Signed)
Pt insurance is active and benefits verified through [facility]. Co-pay $0.00, DED $0.00/$0.00 met, out of pocket $0.00/$0.00 met, co-insurance 0%. No pre-authorization required. Gahel M./[employer], 04/09/21 @ 332PM, LZB#[id number]  Will contact patient to see if she is interested in the Pulmonary Rehab Program.

## 2021-04-10 ENCOUNTER — Ambulatory Visit (INDEPENDENT_AMBULATORY_CARE_PROVIDER_SITE_OTHER): Payer: Medicare HMO

## 2021-04-10 ENCOUNTER — Other Ambulatory Visit: Payer: Self-pay

## 2021-04-10 VITALS — BP 133/87 | HR 73 | Temp 97.7°F | Resp 20

## 2021-04-10 DIAGNOSIS — J4550 Severe persistent asthma, uncomplicated: Secondary | ICD-10-CM | POA: Diagnosis not present

## 2021-04-10 MED ORDER — SODIUM CHLORIDE 0.9 % IV SOLN: Freq: Once | INTRAVENOUS | Status: DC | PRN

## 2021-04-10 MED ORDER — FAMOTIDINE IN NACL 20-0.9 MG/50ML-% IV SOLN: 20.0000 mg | Freq: Once | INTRAVENOUS | Status: DC | PRN

## 2021-04-10 MED ORDER — EPINEPHRINE 0.3 MG/0.3ML IJ SOAJ: 0.3000 mg | Freq: Once | INTRAMUSCULAR | Status: DC | PRN

## 2021-04-10 MED ORDER — METHYLPREDNISOLONE SODIUM SUCC 125 MG IJ SOLR: 125.0000 mg | Freq: Once | INTRAMUSCULAR | Status: DC | PRN

## 2021-04-10 MED ORDER — DIPHENHYDRAMINE HCL 50 MG/ML IJ SOLN: 50.0000 mg | Freq: Once | INTRAMUSCULAR | Status: DC | PRN

## 2021-04-10 MED ORDER — ALBUTEROL SULFATE HFA 108 (90 BASE) MCG/ACT IN AERS: 2.0000 | INHALATION_SPRAY | Freq: Once | RESPIRATORY_TRACT | Status: DC | PRN

## 2021-04-10 MED ORDER — DUPILUMAB 300 MG/2ML ~~LOC~~ SOSY
300.0000 mg | PREFILLED_SYRINGE | Freq: Once | SUBCUTANEOUS | Status: AC
  Administered 2021-04-10: 300 mg via SUBCUTANEOUS
  Filled 2021-04-10: qty 2

## 2021-04-10 NOTE — Progress Notes (Signed)
Diagnosis: Asthma  Provider:  Chilton Greathouse, MD  Procedure: Injection  Dupixent (Dupilumab), Dose: 300 mg, Site: subcutaneous  Discharge: Condition: Good, Destination: Home . AVS declined.   Performed by:  Nat Math, RN

## 2021-04-17 ENCOUNTER — Other Ambulatory Visit (HOSPITAL_COMMUNITY): Payer: Self-pay

## 2021-04-17 MED FILL — Dupilumab Subcutaneous Soln Prefilled Syringe 300 MG/2ML: SUBCUTANEOUS | 28 days supply | Qty: 4 | Fill #1 | Status: AC

## 2021-04-19 ENCOUNTER — Other Ambulatory Visit (HOSPITAL_COMMUNITY): Payer: Self-pay

## 2021-04-22 ENCOUNTER — Other Ambulatory Visit (HOSPITAL_COMMUNITY): Payer: Self-pay

## 2021-04-24 ENCOUNTER — Other Ambulatory Visit: Payer: Self-pay

## 2021-04-24 ENCOUNTER — Ambulatory Visit (INDEPENDENT_AMBULATORY_CARE_PROVIDER_SITE_OTHER): Payer: Medicare HMO

## 2021-04-24 VITALS — BP 120/86 | HR 82 | Temp 98.2°F | Resp 18

## 2021-04-24 DIAGNOSIS — J4550 Severe persistent asthma, uncomplicated: Secondary | ICD-10-CM

## 2021-04-24 MED ORDER — METHYLPREDNISOLONE SODIUM SUCC 125 MG IJ SOLR: 125.0000 mg | Freq: Once | INTRAMUSCULAR | Status: DC | PRN

## 2021-04-24 MED ORDER — SODIUM CHLORIDE 0.9 % IV SOLN: Freq: Once | INTRAVENOUS | Status: DC | PRN

## 2021-04-24 MED ORDER — DUPILUMAB 300 MG/2ML ~~LOC~~ SOSY
300.0000 mg | PREFILLED_SYRINGE | Freq: Once | SUBCUTANEOUS | Status: AC
  Administered 2021-04-24: 300 mg via SUBCUTANEOUS
  Filled 2021-04-24: qty 2

## 2021-04-24 MED ORDER — DIPHENHYDRAMINE HCL 50 MG/ML IJ SOLN: 50.0000 mg | Freq: Once | INTRAMUSCULAR | Status: DC | PRN

## 2021-04-24 MED ORDER — ALBUTEROL SULFATE HFA 108 (90 BASE) MCG/ACT IN AERS: 2.0000 | INHALATION_SPRAY | Freq: Once | RESPIRATORY_TRACT | Status: DC | PRN

## 2021-04-24 MED ORDER — EPINEPHRINE 0.3 MG/0.3ML IJ SOAJ: 0.3000 mg | Freq: Once | INTRAMUSCULAR | Status: DC | PRN

## 2021-04-24 MED ORDER — FAMOTIDINE IN NACL 20-0.9 MG/50ML-% IV SOLN: 20.0000 mg | Freq: Once | INTRAVENOUS | Status: DC | PRN

## 2021-04-24 NOTE — Progress Notes (Signed)
Diagnosis: Asthma  Provider:  Praveen Mannam, MD  Procedure: Injection  Dupixent (Dupilumab), Dose: 300 mg, Site: subcutaneous  Discharge: Condition: Good, Destination: Home . AVS provided to patient.   Performed by:  Naureen Benton, RN       

## 2021-04-25 ENCOUNTER — Telehealth (HOSPITAL_COMMUNITY): Payer: Self-pay

## 2021-04-25 NOTE — Telephone Encounter (Signed)
Called and spoke with pt in regards to [place], pt stated she is not interested at this time due to gas prices.  Closed referrral

## 2021-05-08 ENCOUNTER — Other Ambulatory Visit: Payer: Self-pay

## 2021-05-08 ENCOUNTER — Ambulatory Visit (INDEPENDENT_AMBULATORY_CARE_PROVIDER_SITE_OTHER): Payer: Medicare HMO

## 2021-05-08 VITALS — BP 111/69 | HR 66 | Temp 98.2°F | Resp 16

## 2021-05-08 DIAGNOSIS — J4550 Severe persistent asthma, uncomplicated: Secondary | ICD-10-CM | POA: Diagnosis not present

## 2021-05-08 MED ORDER — FAMOTIDINE IN NACL 20-0.9 MG/50ML-% IV SOLN: 20.0000 mg | Freq: Once | INTRAVENOUS | Status: DC | PRN

## 2021-05-08 MED ORDER — SODIUM CHLORIDE 0.9 % IV SOLN: Freq: Once | INTRAVENOUS | Status: DC | PRN

## 2021-05-08 MED ORDER — DUPILUMAB 300 MG/2ML ~~LOC~~ SOSY
300.0000 mg | PREFILLED_SYRINGE | Freq: Once | SUBCUTANEOUS | Status: AC
  Administered 2021-05-08: 300 mg via SUBCUTANEOUS
  Filled 2021-05-08: qty 2

## 2021-05-08 MED ORDER — ALBUTEROL SULFATE HFA 108 (90 BASE) MCG/ACT IN AERS: 2.0000 | INHALATION_SPRAY | Freq: Once | RESPIRATORY_TRACT | Status: DC | PRN

## 2021-05-08 MED ORDER — EPINEPHRINE 0.3 MG/0.3ML IJ SOAJ: 0.3000 mg | Freq: Once | INTRAMUSCULAR | Status: DC | PRN

## 2021-05-08 MED ORDER — DIPHENHYDRAMINE HCL 50 MG/ML IJ SOLN: 50.0000 mg | Freq: Once | INTRAMUSCULAR | Status: DC | PRN

## 2021-05-08 MED ORDER — METHYLPREDNISOLONE SODIUM SUCC 125 MG IJ SOLR: 125.0000 mg | Freq: Once | INTRAMUSCULAR | Status: DC | PRN

## 2021-05-08 NOTE — Progress Notes (Signed)
Diagnosis: Asthma  Provider:  Praveen Mannam, MD  Procedure: Injection  Dupixent (Dupilumab), Dose: 300 mg, Site: subcutaneous  Discharge: Condition: Good, Destination: Home . AVS provided to patient.   Performed by:  Kilani Joffe, RN       

## 2021-05-14 ENCOUNTER — Other Ambulatory Visit (HOSPITAL_COMMUNITY): Payer: Self-pay

## 2021-05-15 ENCOUNTER — Other Ambulatory Visit (HOSPITAL_COMMUNITY): Payer: Self-pay

## 2021-05-15 MED FILL — Dupilumab Subcutaneous Soln Prefilled Syringe 300 MG/2ML: SUBCUTANEOUS | 28 days supply | Qty: 4 | Fill #2 | Status: AC

## 2021-05-17 ENCOUNTER — Other Ambulatory Visit (HOSPITAL_COMMUNITY): Payer: Self-pay

## 2021-05-22 ENCOUNTER — Ambulatory Visit (INDEPENDENT_AMBULATORY_CARE_PROVIDER_SITE_OTHER): Payer: Medicare HMO | Admitting: *Deleted

## 2021-05-22 ENCOUNTER — Other Ambulatory Visit: Payer: Self-pay

## 2021-05-22 VITALS — BP 124/73 | HR 76 | Temp 98.0°F | Resp 20

## 2021-05-22 DIAGNOSIS — J4550 Severe persistent asthma, uncomplicated: Secondary | ICD-10-CM

## 2021-05-22 MED ORDER — FAMOTIDINE IN NACL 20-0.9 MG/50ML-% IV SOLN: 20.0000 mg | Freq: Once | INTRAVENOUS | Status: DC | PRN

## 2021-05-22 MED ORDER — DUPILUMAB 300 MG/2ML ~~LOC~~ SOSY
300.0000 mg | PREFILLED_SYRINGE | Freq: Once | SUBCUTANEOUS | Status: AC
  Administered 2021-05-22: 300 mg via SUBCUTANEOUS
  Filled 2021-05-22: qty 2

## 2021-05-22 MED ORDER — SODIUM CHLORIDE 0.9 % IV SOLN: Freq: Once | INTRAVENOUS | Status: DC | PRN

## 2021-05-22 MED ORDER — DIPHENHYDRAMINE HCL 50 MG/ML IJ SOLN: 50.0000 mg | Freq: Once | INTRAMUSCULAR | Status: DC | PRN

## 2021-05-22 MED ORDER — METHYLPREDNISOLONE SODIUM SUCC 125 MG IJ SOLR: 125.0000 mg | Freq: Once | INTRAMUSCULAR | Status: DC | PRN

## 2021-05-22 MED ORDER — EPINEPHRINE 0.3 MG/0.3ML IJ SOAJ: 0.3000 mg | Freq: Once | INTRAMUSCULAR | Status: DC | PRN

## 2021-05-22 MED ORDER — ALBUTEROL SULFATE HFA 108 (90 BASE) MCG/ACT IN AERS: 2.0000 | INHALATION_SPRAY | Freq: Once | RESPIRATORY_TRACT | Status: DC | PRN

## 2021-05-22 NOTE — Progress Notes (Signed)
Diagnosis: Asthma  Provider:  Praveen Mannam, MD  Procedure: Injection  Dupixent (Dupilumab), Dose: 300 mg, Site: subcutaneous  Discharge: Condition: Good, Destination: Home . AVS provided to patient.   Performed by:  Divine Hansley A, RN        

## 2021-06-05 ENCOUNTER — Other Ambulatory Visit: Payer: Self-pay

## 2021-06-05 ENCOUNTER — Ambulatory Visit (INDEPENDENT_AMBULATORY_CARE_PROVIDER_SITE_OTHER): Payer: Medicare HMO | Admitting: *Deleted

## 2021-06-05 VITALS — BP 131/92 | HR 85 | Temp 98.3°F | Resp 20

## 2021-06-05 DIAGNOSIS — J4550 Severe persistent asthma, uncomplicated: Secondary | ICD-10-CM

## 2021-06-05 MED ORDER — ALBUTEROL SULFATE HFA 108 (90 BASE) MCG/ACT IN AERS: 2.0000 | INHALATION_SPRAY | Freq: Once | RESPIRATORY_TRACT | Status: DC | PRN

## 2021-06-05 MED ORDER — METHYLPREDNISOLONE SODIUM SUCC 125 MG IJ SOLR: 125.0000 mg | Freq: Once | INTRAMUSCULAR | Status: DC | PRN

## 2021-06-05 MED ORDER — FAMOTIDINE IN NACL 20-0.9 MG/50ML-% IV SOLN: 20.0000 mg | Freq: Once | INTRAVENOUS | Status: DC | PRN

## 2021-06-05 MED ORDER — DIPHENHYDRAMINE HCL 50 MG/ML IJ SOLN: 50.0000 mg | Freq: Once | INTRAMUSCULAR | Status: DC | PRN

## 2021-06-05 MED ORDER — EPINEPHRINE 0.3 MG/0.3ML IJ SOAJ: 0.3000 mg | Freq: Once | INTRAMUSCULAR | Status: DC | PRN

## 2021-06-05 MED ORDER — DUPILUMAB 300 MG/2ML ~~LOC~~ SOSY
300.0000 mg | PREFILLED_SYRINGE | Freq: Once | SUBCUTANEOUS | Status: AC
  Administered 2021-06-05: 300 mg via SUBCUTANEOUS
  Filled 2021-06-05: qty 2

## 2021-06-05 MED ORDER — SODIUM CHLORIDE 0.9 % IV SOLN: Freq: Once | INTRAVENOUS | Status: DC | PRN

## 2021-06-05 NOTE — Progress Notes (Signed)
Diagnosis: Asthma  Provider:  Praveen Mannam, MD  Procedure: Injection  Dupixent (Dupilumab), Dose: 300 mg, Site: subcutaneous  Discharge: Condition: Good, Destination: Home . AVS provided to patient.   Performed by:  Naveh Rickles A, RN        

## 2021-06-12 ENCOUNTER — Other Ambulatory Visit (HOSPITAL_COMMUNITY): Payer: Self-pay

## 2021-06-14 ENCOUNTER — Other Ambulatory Visit: Payer: Self-pay | Admitting: Critical Care Medicine

## 2021-06-14 ENCOUNTER — Other Ambulatory Visit (HOSPITAL_COMMUNITY): Payer: Self-pay

## 2021-06-17 ENCOUNTER — Other Ambulatory Visit (HOSPITAL_COMMUNITY): Payer: Self-pay

## 2021-06-17 MED FILL — Dupilumab Subcutaneous Soln Prefilled Syringe 300 MG/2ML: SUBCUTANEOUS | 28 days supply | Qty: 4 | Fill #3 | Status: AC

## 2021-06-18 ENCOUNTER — Other Ambulatory Visit (HOSPITAL_COMMUNITY): Payer: Self-pay

## 2021-06-19 ENCOUNTER — Other Ambulatory Visit: Payer: Self-pay

## 2021-06-19 ENCOUNTER — Ambulatory Visit (INDEPENDENT_AMBULATORY_CARE_PROVIDER_SITE_OTHER): Payer: Medicare HMO | Admitting: *Deleted

## 2021-06-19 VITALS — BP 125/91 | HR 83 | Temp 98.2°F | Resp 18

## 2021-06-19 DIAGNOSIS — J4550 Severe persistent asthma, uncomplicated: Secondary | ICD-10-CM | POA: Diagnosis not present

## 2021-06-19 MED ORDER — EPINEPHRINE 0.3 MG/0.3ML IJ SOAJ: 0.3000 mg | Freq: Once | INTRAMUSCULAR | Status: DC | PRN

## 2021-06-19 MED ORDER — DUPILUMAB 300 MG/2ML ~~LOC~~ SOSY
300.0000 mg | PREFILLED_SYRINGE | Freq: Once | SUBCUTANEOUS | Status: AC
  Administered 2021-06-19: 300 mg via SUBCUTANEOUS
  Filled 2021-06-19: qty 2

## 2021-06-19 MED ORDER — ALBUTEROL SULFATE HFA 108 (90 BASE) MCG/ACT IN AERS: 2.0000 | INHALATION_SPRAY | Freq: Once | RESPIRATORY_TRACT | Status: DC | PRN

## 2021-06-19 MED ORDER — SODIUM CHLORIDE 0.9 % IV SOLN: Freq: Once | INTRAVENOUS | Status: DC | PRN

## 2021-06-19 MED ORDER — METHYLPREDNISOLONE SODIUM SUCC 125 MG IJ SOLR: 125.0000 mg | Freq: Once | INTRAMUSCULAR | Status: DC | PRN

## 2021-06-19 MED ORDER — FAMOTIDINE IN NACL 20-0.9 MG/50ML-% IV SOLN: 20.0000 mg | Freq: Once | INTRAVENOUS | Status: DC | PRN

## 2021-06-19 MED ORDER — DIPHENHYDRAMINE HCL 50 MG/ML IJ SOLN: 50.0000 mg | Freq: Once | INTRAMUSCULAR | Status: DC | PRN

## 2021-06-19 NOTE — Progress Notes (Signed)
Diagnosis: Asthma  Provider:  Praveen Mannam, MD  Procedure: Injection  Dupixent (Dupilumab), Dose: 300 mg, Site: subcutaneous  Discharge: Condition: Good, Destination: Home . AVS provided to patient.   Performed by:  Keidra Withers A, RN        

## 2021-06-26 ENCOUNTER — Other Ambulatory Visit: Payer: Self-pay | Admitting: Physician Assistant

## 2021-06-26 DIAGNOSIS — R002 Palpitations: Secondary | ICD-10-CM

## 2021-06-26 NOTE — Telephone Encounter (Signed)
That's fine.  Only give a 90 day supply with a note to make appt for further refills.

## 2021-06-27 ENCOUNTER — Emergency Department (HOSPITAL_BASED_OUTPATIENT_CLINIC_OR_DEPARTMENT_OTHER)
Admission: EM | Admit: 2021-06-27 | Discharge: 2021-06-27 | Disposition: A | Discharge status: Home / Self Care | Payer: Medicare HMO | Attending: Emergency Medicine | Admitting: Emergency Medicine

## 2021-06-27 ENCOUNTER — Emergency Department (HOSPITAL_BASED_OUTPATIENT_CLINIC_OR_DEPARTMENT_OTHER): Payer: Medicare HMO

## 2021-06-27 ENCOUNTER — Encounter (HOSPITAL_BASED_OUTPATIENT_CLINIC_OR_DEPARTMENT_OTHER): Payer: Self-pay

## 2021-06-27 ENCOUNTER — Other Ambulatory Visit: Payer: Self-pay

## 2021-06-27 DIAGNOSIS — J45909 Unspecified asthma, uncomplicated: Secondary | ICD-10-CM | POA: Diagnosis not present

## 2021-06-27 DIAGNOSIS — Z87891 Personal history of nicotine dependence: Secondary | ICD-10-CM | POA: Insufficient documentation

## 2021-06-27 DIAGNOSIS — Z91040 Latex allergy status: Secondary | ICD-10-CM | POA: Insufficient documentation

## 2021-06-27 DIAGNOSIS — Z7951 Long term (current) use of inhaled steroids: Secondary | ICD-10-CM | POA: Diagnosis not present

## 2021-06-27 DIAGNOSIS — U071 COVID-19: Secondary | ICD-10-CM | POA: Diagnosis not present

## 2021-06-27 DIAGNOSIS — R0602 Shortness of breath: Secondary | ICD-10-CM | POA: Diagnosis present

## 2021-06-27 MED ORDER — NIRMATRELVIR/RITONAVIR (PAXLOVID)TABLET: 3.0000 | ORAL_TABLET | Freq: Two times a day (BID) | ORAL | 0 refills | Status: DC

## 2021-06-27 MED ORDER — NIRMATRELVIR/RITONAVIR (PAXLOVID)TABLET: 3.0000 | ORAL_TABLET | Freq: Two times a day (BID) | ORAL | 0 refills | Status: AC

## 2021-06-27 MED ORDER — BENZONATATE 200 MG PO CAPS: 200.0000 mg | ORAL_CAPSULE | Freq: Three times a day (TID) | ORAL | 0 refills | Status: AC

## 2021-06-27 NOTE — ED Provider Notes (Signed)
[facility] EMERGENCY DEPARTMENT Provider Note   CSN: [id number] Arrival date & time: 06/27/21  1803     History Chief Complaint  Patient presents with   Fever    Leslie Sanders is a 45 y.o. female.  45 year old female with complaint of shortness of breath and palpitations (described as feeling like her heart is skipping beats) with fevers and dizziness. Onset 2 days ago, had a positive COVID test, called PCP and was advised to go to the ER. Patient has a history of asthma, is using her inhaler and medications as directed.   Leslie Sanders was evaluated in Emergency Department on 06/27/2021 for the symptoms described in the history of present illness. She was evaluated in the context of the global COVID-19 pandemic, which necessitated consideration that the patient might be at risk for infection with the SARS-CoV-2 virus that causes COVID-19. Institutional protocols and algorithms that pertain to the evaluation of patients at risk for COVID-19 are in a state of rapid change based on information released by regulatory bodies including the CDC and federal and state organizations. These policies and algorithms were followed during the patient's care in the ED.       Past Medical History:  Diagnosis Date   Anemia    (a) chronic blood loss hx of menorrhagia s/p endometrial biopsy 04/2015 (b) iron deficient   Anxiety    B12 deficiency    Bipolar 1 disorder (HCC)    Cervical spinal stenosis    Cervical spondylosis without myelopathy 07/08/2016   Chronic pain of right thumb 04/28/2018   Depression    GERD (gastroesophageal reflux disease)    Hemorrhoids    Pt with constipation secondary to iron supplement. Pt has had rectal bleed secondary to internal hemorrhoids. 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   Fibromyalgia 03/24/2019   Anemia, iron deficiency 10/12/2016   Alcohol use disorder, moderate, dependence (HCC) 10/12/2016   Major depressive disorder, 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   Cervical spinal stenosis 04/16/2015   Sickle cell trait (HCC) 04/16/2015    Past Surgical History:  Procedure Laterality Date   ABDOMINAL HYSTERECTOMY     BREAST LUMPECTOMY Bilateral    BREAST REDUCTION SURGERY     Carpal Metacarpal Arthroplasty Right 05/13/2019   CHOLECYSTECTOMY  1990   HAND SURGERY     HYSTERECTOMY ABDOMINAL WITH SALPINGECTOMY  11/2017   POSTERIOR CERVICAL LAMINECTOMY Bilateral 05/23/2019   RIGHT/LEFT HEART CATH AND CORONARY ANGIOGRAPHY N/A 01/13/2020   Procedure: RIGHT/LEFT HEART CATH AND CORONARY ANGIOGRAPHY;  Surgeon: Lyn Records, MD;  Location: [facility];  Service: Cardiovascular;  Laterality: N/A;   ROUX-EN-Y GASTRIC BYPASS  2003   in [place]   SPINAL FUSION  05/23/2019     OB History   No obstetric history on file.     Family History  Problem Relation Age of Onset   Schizophrenia Maternal Aunt    ADD / ADHD Son    ODD Son  Asthma Son    Breast cancer Mother    Arthritis/Rheumatoid Mother    Lupus Mother    HIV Father    Rheum arthritis Maternal Grandmother    COPD Maternal Grandmother    COPD Paternal Grandmother     Social History   Tobacco Use   Smoking status: Former    Packs/day: 1.00    Years: 15.00    Pack years: 15.00    Types: Cigarettes    Quit date: 12/08/2018    Years since quitting: 2.5   Smokeless tobacco: Never  Vaping Use   Vaping Use: Never used  Substance Use Topics   Alcohol use: Not Currently    Alcohol/week: 28.0 standard drinks     Types: 28 Cans of beer per week    Comment: Nothing in 4 years   Drug use: No    Home Medications Prior to Admission medications   Medication Sig Start Date End Date Taking? Authorizing Provider  benzonatate (TESSALON) 200 MG capsule Take 1 capsule (200 mg total) by mouth every 8 (eight) hours for 10 days. 06/27/21 07/07/21 Yes Jeannie Fend, PA-C  albuterol (PROVENTIL) (2.5 MG/3ML) 0.083% nebulizer solution Take 3 mLs (2.5 mg total) by nebulization every 6 (six) hours as needed for wheezing or shortness of breath (Inhale 28ml every 6 hours and as needed.). 08/15/20 09/14/20  Karie Fetch P, DO  albuterol (VENTOLIN HFA) 108 (90 Base) MCG/ACT inhaler INHALE 1 PUFF INTO THE LUNGS EVERY 6 HOURS AS NEEDED FOR WHEEZING OR SHORTNESS OF BREATH Patient taking differently: Inhale 2 puffs into the lungs every 6 (six) hours as needed for wheezing or shortness of breath. 01/09/21   Parrett, Virgel Bouquet, NP  azelastine (ASTELIN) 0.1 % nasal spray Place 2 sprays into both nostrils 2 (two) times daily. Use in each nostril as directed Patient taking differently: Place 2 sprays into both nostrils 2 (two) times daily. 08/15/20   Karie Fetch P, DO  BREZTRI AEROSPHERE 160-9-4.8 MCG/ACT AERO INHALE 2 PUFFS INTO THE LUNGS TWICE DAILY 06/14/21   Charlott Holler, MD  buprenorphine (BUTRANS) 20 MCG/HR PTWK Place 1 patch onto the skin once a week. On both shoulders    [provider]  busPIRone (BUSPAR) 5 MG tablet Take 5 mg by mouth 2 (two) times daily. 12/30/19   [provider]  cyanocobalamin (,VITAMIN B-12,) 1000 MCG/ML injection Inject 1 mL into the muscle every 30 (thirty) days.    [provider]  diclofenac Sodium (VOLTAREN) 1 % GEL Apply 2 g topically daily as needed.    [provider]  docusate sodium (COLACE) 100 MG capsule Take 100 mg by mouth daily.    [provider]  DULoxetine (CYMBALTA) 60 MG capsule Take 60 mg by mouth daily.  03/10/19   [provider]   dupilumab (DUPIXENT) 300 MG/2ML prefilled syringe INJECT 300 MG INTO THE SKIN EVERY 14 (FOURTEEN) DAYS. Patient taking differently: Inject 300 mg into the skin every 14 (fourteen) days. 12/19/20 12/19/21  Steffanie Dunn, DO  ferrous sulfate 325 (65 FE) MG tablet Take 1 tablet (325 mg total) by mouth 2 (two) times daily with a meal. 12/15/16   Oneta Rack, NP  fluticasone (FLONASE) 50 MCG/ACT nasal spray Place 2 sprays into both nostrils daily. 12/12/19   Steffanie Dunn, DO  folic acid (FOLVITE) 1 MG tablet Take 1 mg by mouth daily.  04/07/19   [provider]  furosemide (LASIX) 40 MG tablet Take 1 tablet (40  mg total) by mouth daily. 06/14/20   Rosalio MacadamiaGerhardt, Lori C, NP  glycopyrrolate (ROBINUL) 2 MG tablet Take 1 tablet (2 mg total) by mouth 2 (two) times daily. 01/18/20   Esterwood, Amy S, PA-C  hydrOXYzine (ATARAX/VISTARIL) 25 MG tablet Take 1 tablet (25 mg total) by mouth every 6 (six) hours as needed for anxiety. 12/22/19   Long, Arlyss RepressJoshua G, MD  hydrOXYzine (VISTARIL) 50 MG capsule Take 50 mg by mouth every 8 (eight) hours as needed for anxiety or itching. 09/20/20   [provider]  midodrine (PROAMATINE) 5 MG tablet Take 1 tablet (5 mg total) by mouth 3 (three) times daily with meals. 06/14/20   Rosalio MacadamiaGerhardt, Lori C, NP  montelukast (SINGULAIR) 10 MG tablet Take 10 mg by mouth daily as needed.    [provider]  nirmatrelvir/ritonavir EUA (PAXLOVID) TABS Take 3 tablets by mouth 2 (two) times daily for 5 days. Patient GFR is 60. Take nirmatrelvir (150 mg) two tablets twice daily for 5 days and ritonavir (100 mg) one tablet twice daily for 5 days. 06/27/21 07/02/21  Jeannie FendMurphy, Delphine Sizemore A, PA-C  omeprazole (PRILOSEC) 40 MG capsule TAKE 1 CAPSULE(40 MG) BY MOUTH DAILY 12/25/20   Esterwood, Amy S, PA-C  omeprazole (PRILOSEC) 40 MG capsule Take 40 mg by mouth daily.    [provider]  pantoprazole (PROTONIX) 40 MG tablet Take 1 tablet (40 mg total) by mouth daily. 01/11/20   Steffanie Dunnlark, Darryll Raju P,  DO  pregabalin (LYRICA) 200 MG capsule Take 200 mg by mouth 2 (two) times daily. 09/11/20   [provider]  propranolol (INDERAL) 20 MG tablet TAKE 1 TABLET(20 MG) BY MOUTH 3 TO 4 TIMES DAILY AS NEEDED 06/26/21   Lyn RecordsSmith, Henry W, MD  valACYclovir (VALTREX) 1000 MG tablet Take 1,000 mg by mouth 2 (two) times daily.    [provider]  Vitamin D, Ergocalciferol, (DRISDOL) 1.25 MG (50000 UT) CAPS capsule Take 50,000 Units by mouth every Monday.  07/25/19   [provider]    Allergies    Tylenol [acetaminophen], Adhesive  [tape], Hydrocodone-acetaminophen, Nsaids, and Latex  Review of Systems   Review of Systems  Constitutional:  Positive for fever.  HENT:  Positive for congestion.   Respiratory:  Positive for cough and shortness of breath.   Cardiovascular:  Positive for palpitations.  Gastrointestinal:  Negative for nausea and vomiting.  Musculoskeletal:  Positive for arthralgias and myalgias.  Skin:  Negative for rash and wound.  Neurological:  Negative for weakness.  Hematological:  Negative for adenopathy.  Psychiatric/Behavioral:  Negative for confusion.   All other systems reviewed and are negative.  Physical Exam Updated Vital Signs BP 126/87   Pulse 84   Resp 20   Ht 5\' 2"  (1.575 m)   Wt 62.6 kg   LMP 10/07/2016   SpO2 100%   BMI 25.24 kg/m   Physical Exam Vitals and nursing note reviewed.  Constitutional:      General: She is not in acute distress.    Appearance: She is well-developed. She is not diaphoretic.  HENT:     Head: Normocephalic and atraumatic.  Eyes:     Conjunctiva/sclera: Conjunctivae normal.  Cardiovascular:     Rate and Rhythm: Normal rate and regular rhythm.     Heart sounds: Normal heart sounds.  Pulmonary:     Effort: Tachypnea present.     Comments: Mild wheezing Musculoskeletal:     Cervical back: Neck supple.     Right lower leg: No  edema.     Left lower leg: No edema.  Skin:    General: Skin is warm and dry.      Findings: No erythema or rash.  Neurological:     Mental Status: She is alert and oriented to person, place, and time.  Psychiatric:        Behavior: Behavior normal.    ED Results / Procedures / Treatments   Labs (all labs ordered are listed, but only abnormal results are displayed) Labs Reviewed - No data to display  EKG EKG Interpretation  Date/Time:  Thursday June 27 2021 19:01:11 EDT Ventricular Rate:  80 PR Interval:  141 QRS Duration: 99 QT Interval:  389 QTC Calculation: 449 R Axis:   50 Text Interpretation: Sinus rhythm Confirmed by Virgina Norfolk ([id number]) on 06/27/2021 7:06:57 PM  Radiology DG Chest Port 1 View  Result Date: 06/27/2021 CLINICAL DATA:  COVID, cough, SHOB, asthma EXAM: PORTABLE CHEST 1 VIEW COMPARISON:  Radiograph 01/25/2021 FINDINGS: The heart size and mediastinal contours are within normal limits. Both lungs are clear. The visualized skeletal structures are unremarkable. Partially visualized cervical spine fusion hardware. Unchanged surgical material along the upper abdomen. IMPRESSION: No evidence of acute cardiopulmonary disease. Electronically Signed   By: Caprice Renshaw   On: 06/27/2021 19:24    Procedures Procedures   Medications Ordered in ED Medications - No data to display  ED Course  I have reviewed the triage vital signs and the nursing notes.  Pertinent labs & imaging results that were available during my care of the patient were reviewed by me and considered in my medical decision making (see chart for details).  Clinical Course as of 06/27/21 1940  Thu Jun 27, 2021  6531 45 year old female with history of asthma presents with COVID, positive test at home, symptom onset 2 days ago.  On exam she is mildly tachypneic with mild wheezing.  Vitals reassuring including O2 sat on room air of 100%. Chest x-ray is unremarkable.  Plan is to treat with Paxlovid. Recommend recheck with PCP.  [LM]    Clinical Course User Index [LM] Alden Hipp   MDM Rules/Calculators/A&P                           Final Clinical Impression(s) / ED Diagnoses Final diagnoses:  COVID    Rx / DC Orders ED Discharge Orders          Ordered    nirmatrelvir/ritonavir EUA (PAXLOVID) TABS  2 times daily,   Status:  Discontinued        06/27/21 1913    benzonatate (TESSALON) 200 MG capsule  Every 8 hours        06/27/21 1913    nirmatrelvir/ritonavir EUA (PAXLOVID) TABS  2 times daily,   Status:  Discontinued        06/27/21 1914    nirmatrelvir/ritonavir EUA (PAXLOVID) TABS  2 times daily        06/27/21 1915             Jeannie Fend, PA-C 06/27/21 1940    Virgina Norfolk, DO 06/27/21 2209

## 2021-06-27 NOTE — Discharge Instructions (Addendum)
Your chest x-ray today is normal in appearance.  Your vitals are reassuring.  Take Paxlovid as prescribed. Take Tessalon as needed as directed for cough. Continue to use your regular medications.  Recheck with your primary care provider.

## 2021-06-27 NOTE — ED Triage Notes (Signed)
"  Fever, dizziness, chills, shortness of breath, cough x 2 days, tested positive for covid today with a rapid test" per pt

## 2021-07-03 ENCOUNTER — Ambulatory Visit: Payer: Medicare HMO

## 2021-07-03 ENCOUNTER — Telehealth: Payer: Self-pay

## 2021-07-03 NOTE — Telephone Encounter (Signed)
Called to verify if patient was coming in today for dupixent injection. No answer, left a message asking her to give [place] a call back. Thanks!

## 2021-07-04 ENCOUNTER — Telehealth: Payer: Self-pay

## 2021-07-09 ENCOUNTER — Other Ambulatory Visit (HOSPITAL_COMMUNITY): Payer: Self-pay

## 2021-07-11 ENCOUNTER — Other Ambulatory Visit (HOSPITAL_COMMUNITY): Payer: Self-pay

## 2021-07-15 ENCOUNTER — Other Ambulatory Visit: Payer: Self-pay | Admitting: Critical Care Medicine

## 2021-07-15 ENCOUNTER — Telehealth: Payer: Self-pay | Admitting: Internal Medicine

## 2021-07-15 ENCOUNTER — Other Ambulatory Visit (HOSPITAL_COMMUNITY): Payer: Self-pay

## 2021-07-15 DIAGNOSIS — J4550 Severe persistent asthma, uncomplicated: Secondary | ICD-10-CM

## 2021-07-15 MED ORDER — DUPIXENT 300 MG/2ML ~~LOC~~ SOSY
300.0000 mg | PREFILLED_SYRINGE | SUBCUTANEOUS | 1 refills | Status: DC
  Filled 2021-07-15 – 2021-07-16 (×2): qty 4, 28d supply, fill #0
  Filled 2021-08-07: qty 4, 28d supply, fill #1

## 2021-07-15 MED ORDER — DUPIXENT 300 MG/2ML ~~LOC~~ SOSY
PREFILLED_SYRINGE | SUBCUTANEOUS | 5 refills | Status: DC
  Filled 2021-07-15: qty 4, 28d supply, fill #0

## 2021-07-15 NOTE — Telephone Encounter (Signed)
Routing to Rph

## 2021-07-15 NOTE — Telephone Encounter (Signed)
Pt is needing for her Dupixent to be refilled and approved before 07/17/2021 because she is scheduled for an injection appt and cannot afford to miss another one because last time she was sick and could not come.  Pharmacy; [facility]   Pls regard; [phone]

## 2021-07-15 NOTE — Telephone Encounter (Signed)
Patient needs appointment with Dr. Celine Mans. Expected f/u was in June 2022 and no f/u scheduled  Routing to scheduling team to assist. Refill for Dupixent sent to [facility] for 2 month only since patient needs appt  Chesley Mires, PharmD, MPH, BCPS Clinical Pharmacist (Rheumatology and Pulmonology)

## 2021-07-16 ENCOUNTER — Other Ambulatory Visit (HOSPITAL_COMMUNITY): Payer: Self-pay

## 2021-07-17 ENCOUNTER — Other Ambulatory Visit: Payer: Self-pay

## 2021-07-17 ENCOUNTER — Ambulatory Visit (INDEPENDENT_AMBULATORY_CARE_PROVIDER_SITE_OTHER): Payer: Medicare HMO

## 2021-07-17 VITALS — BP 105/67 | HR 64 | Temp 98.5°F | Resp 18

## 2021-07-17 DIAGNOSIS — J4550 Severe persistent asthma, uncomplicated: Secondary | ICD-10-CM

## 2021-07-17 MED ORDER — DIPHENHYDRAMINE HCL 50 MG/ML IJ SOLN: 50.0000 mg | Freq: Once | INTRAMUSCULAR | Status: DC | PRN

## 2021-07-17 MED ORDER — EPINEPHRINE 0.3 MG/0.3ML IJ SOAJ: 0.3000 mg | Freq: Once | INTRAMUSCULAR | Status: DC | PRN

## 2021-07-17 MED ORDER — DUPILUMAB 300 MG/2ML ~~LOC~~ SOSY
300.0000 mg | PREFILLED_SYRINGE | Freq: Once | SUBCUTANEOUS | Status: AC
  Administered 2021-07-17: 300 mg via SUBCUTANEOUS
  Filled 2021-07-17: qty 2

## 2021-07-17 MED ORDER — FAMOTIDINE IN NACL 20-0.9 MG/50ML-% IV SOLN: 20.0000 mg | Freq: Once | INTRAVENOUS | Status: DC | PRN

## 2021-07-17 MED ORDER — ALBUTEROL SULFATE HFA 108 (90 BASE) MCG/ACT IN AERS: 2.0000 | INHALATION_SPRAY | Freq: Once | RESPIRATORY_TRACT | Status: DC | PRN

## 2021-07-17 MED ORDER — METHYLPREDNISOLONE SODIUM SUCC 125 MG IJ SOLR: 125.0000 mg | Freq: Once | INTRAMUSCULAR | Status: DC | PRN

## 2021-07-17 MED ORDER — SODIUM CHLORIDE 0.9 % IV SOLN: Freq: Once | INTRAVENOUS | Status: DC | PRN

## 2021-07-17 NOTE — Progress Notes (Signed)
Diagnosis: Asthma  Provider:  Chilton Greathouse, MD  Procedure: Injection  Dupixent (Dupilumab), Dose: 300 mg, Site: subcutaneous right arm  Discharge: Condition: Good, Destination: Home . AVS declined by patient.   Performed by:  Mei Suits, Lyman Speller, LPN

## 2021-07-24 ENCOUNTER — Encounter: Payer: Self-pay | Admitting: Internal Medicine

## 2021-07-26 ENCOUNTER — Other Ambulatory Visit: Payer: Self-pay | Admitting: Interventional Cardiology

## 2021-07-26 DIAGNOSIS — R002 Palpitations: Secondary | ICD-10-CM

## 2021-07-31 ENCOUNTER — Ambulatory Visit (INDEPENDENT_AMBULATORY_CARE_PROVIDER_SITE_OTHER): Payer: Medicare HMO

## 2021-07-31 ENCOUNTER — Other Ambulatory Visit: Payer: Self-pay

## 2021-07-31 VITALS — BP 120/81 | HR 82 | Temp 98.0°F | Resp 16

## 2021-07-31 DIAGNOSIS — J4550 Severe persistent asthma, uncomplicated: Secondary | ICD-10-CM | POA: Diagnosis not present

## 2021-07-31 MED ORDER — DUPILUMAB 300 MG/2ML ~~LOC~~ SOSY
300.0000 mg | PREFILLED_SYRINGE | Freq: Once | SUBCUTANEOUS | Status: AC
  Administered 2021-07-31: 300 mg via SUBCUTANEOUS
  Filled 2021-07-31: qty 2

## 2021-07-31 MED ORDER — DIPHENHYDRAMINE HCL 50 MG/ML IJ SOLN: 50.0000 mg | Freq: Once | INTRAMUSCULAR | Status: DC | PRN

## 2021-07-31 MED ORDER — METHYLPREDNISOLONE SODIUM SUCC 125 MG IJ SOLR: 125.0000 mg | Freq: Once | INTRAMUSCULAR | Status: DC | PRN

## 2021-07-31 MED ORDER — FAMOTIDINE IN NACL 20-0.9 MG/50ML-% IV SOLN: 20.0000 mg | Freq: Once | INTRAVENOUS | Status: DC | PRN

## 2021-07-31 MED ORDER — EPINEPHRINE 0.3 MG/0.3ML IJ SOAJ: 0.3000 mg | Freq: Once | INTRAMUSCULAR | Status: DC | PRN

## 2021-07-31 MED ORDER — SODIUM CHLORIDE 0.9 % IV SOLN: Freq: Once | INTRAVENOUS | Status: DC | PRN

## 2021-07-31 MED ORDER — ALBUTEROL SULFATE HFA 108 (90 BASE) MCG/ACT IN AERS: 2.0000 | INHALATION_SPRAY | Freq: Once | RESPIRATORY_TRACT | Status: DC | PRN

## 2021-07-31 NOTE — Progress Notes (Signed)
Diagnosis: Asthma  Provider:  Praveen Mannam, MD  Procedure: Injection  Dupixent (Dupilumab), Dose: 300 mg, Site: subcutaneous  Discharge: Condition: Good, Destination: Home . AVS provided to patient.   Performed by:  Ravindra Baranek E, LPN        

## 2021-08-07 ENCOUNTER — Other Ambulatory Visit (HOSPITAL_COMMUNITY): Payer: Self-pay

## 2021-08-08 ENCOUNTER — Other Ambulatory Visit (HOSPITAL_COMMUNITY): Payer: Self-pay

## 2021-08-11 IMAGING — CT CT ANGIO CHEST
2 of 8 series · 19 of 36 positions shown · IV contrast (Omnipaque)
Comparison: None. Chest x-ray dated 09/08/2019

CLINICAL DATA: Shortness of breath, history of spine surgery with
shortness of breath, worse with exertion.

EXAM:
CT ANGIOGRAPHY CHEST WITH CONTRAST
TECHNIQUE: Multidetector CT imaging of the chest was performed using the
standard protocol during bolus administration of intravenous
contrast. Multiplanar CT image reconstructions and MIPs were
obtained to evaluate the vascular anatomy.
CONTRAST:  75mL OMNIPAQUE IOHEXOL 350 MG/ML SOLN

[Series 6: pe coronal mpr · coronal · 0.59mm/px · 1 of 110 slices shown]
[im 55/110  mediastinal]
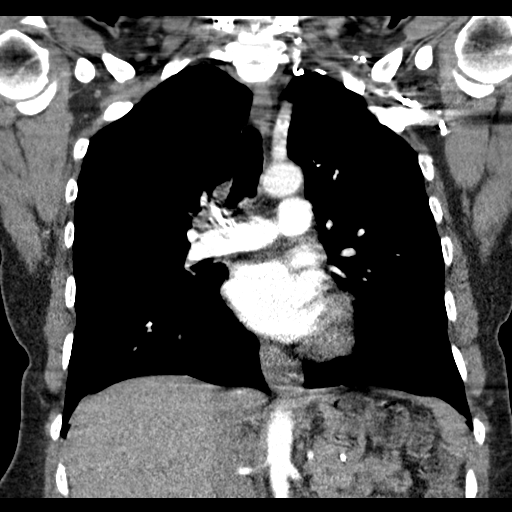

[Series 10: pe thins · axial · 0.66mm/px · z∈[-249,+4]mm · 18 of 283 slices shown]
[im 15/283  lung]
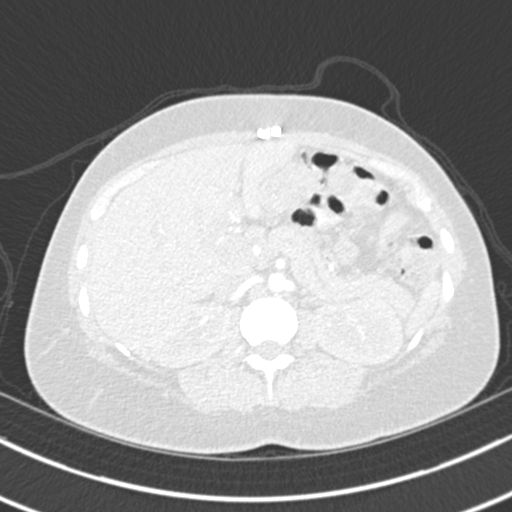
[im 30/283  mediastinal]
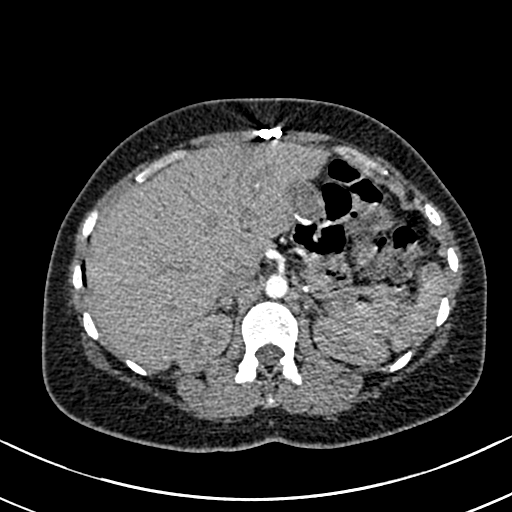
[im 45/283  lung]
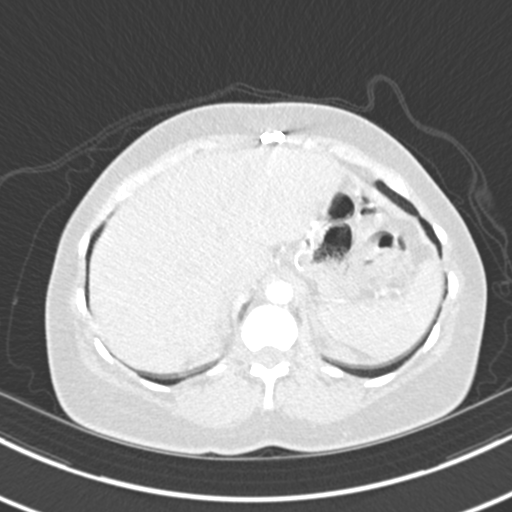
[im 60/283  mediastinal]
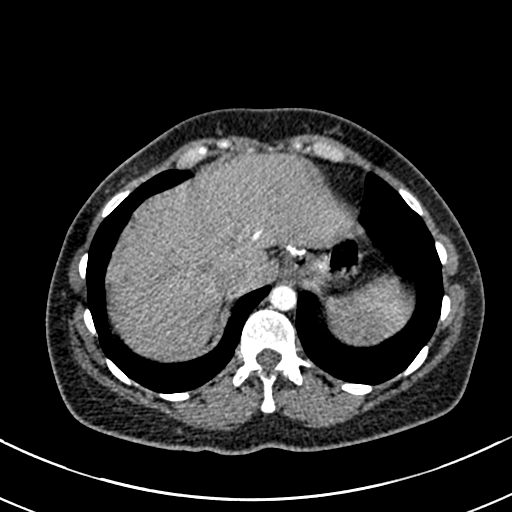
[im 75/283  lung]
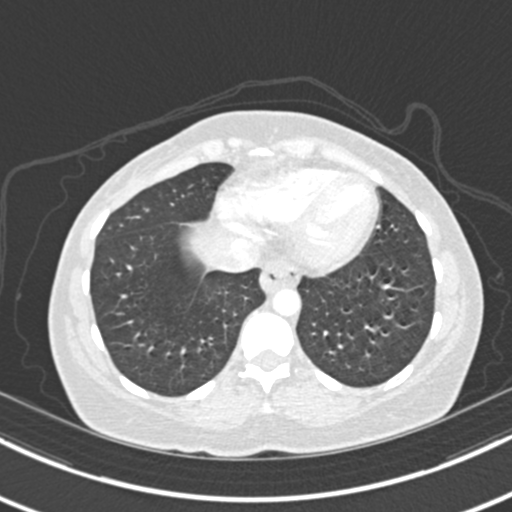
[im 90/283  mediastinal]
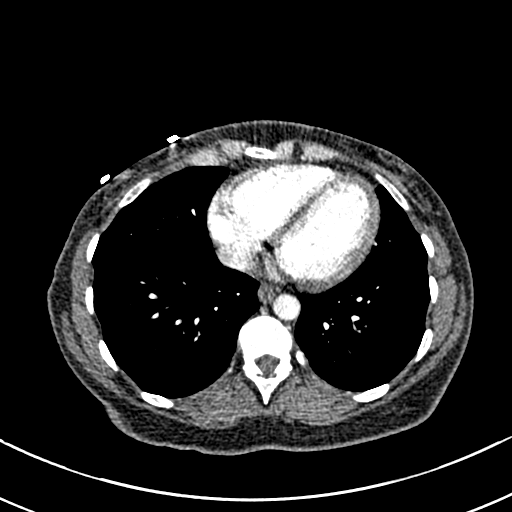
[im 104/283  lung]
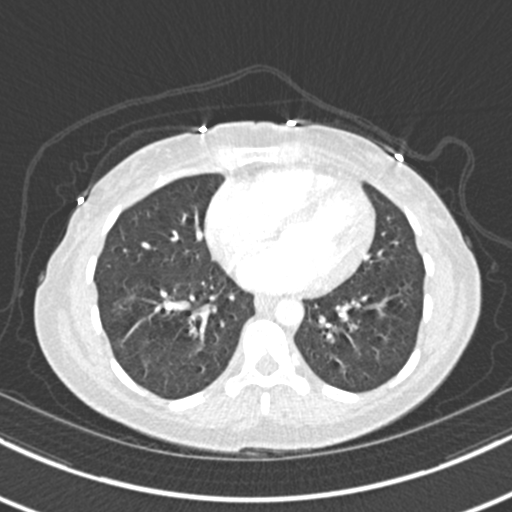
[im 119/283  mediastinal]
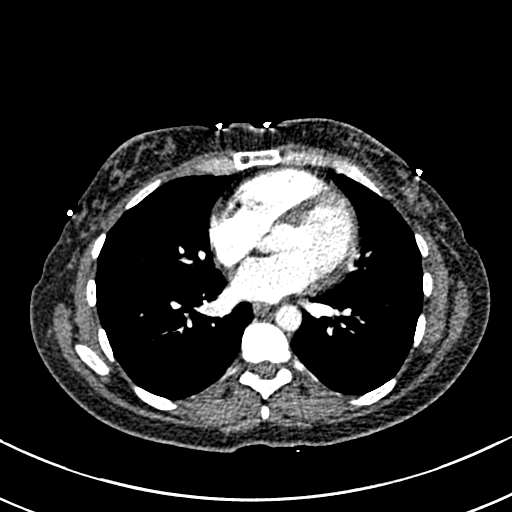
[im 134/283  lung]
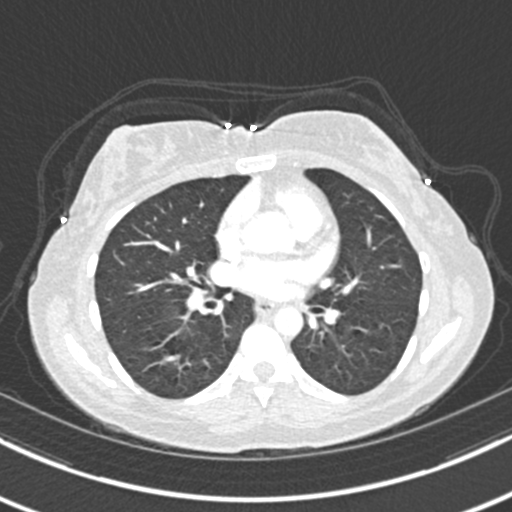
[im 149/283  mediastinal]
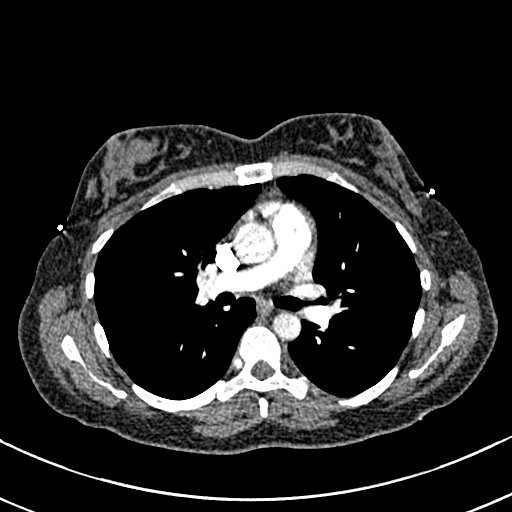
[im 164/283  lung]
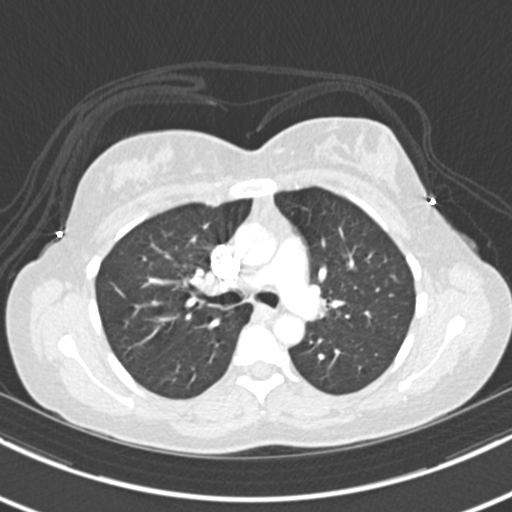
[im 179/283  mediastinal]
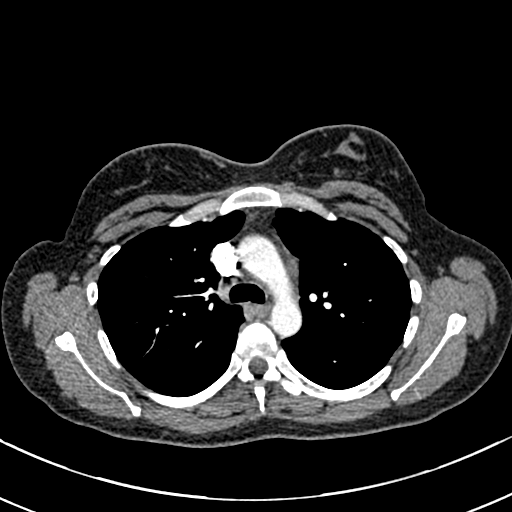
[im 193/283  lung]
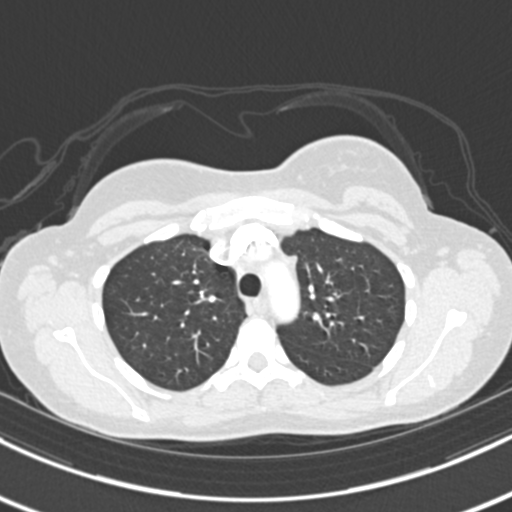
[im 208/283  mediastinal]
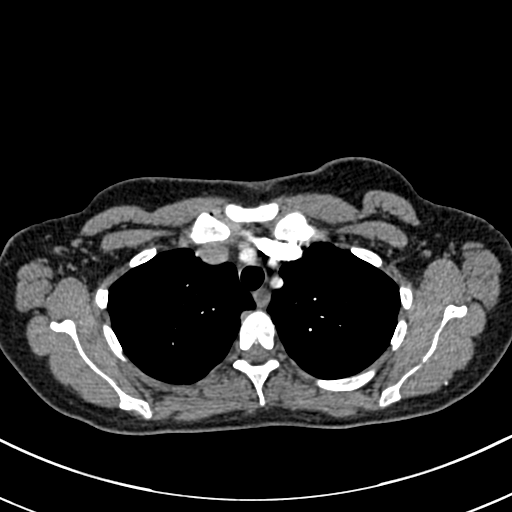
[im 223/283  lung]
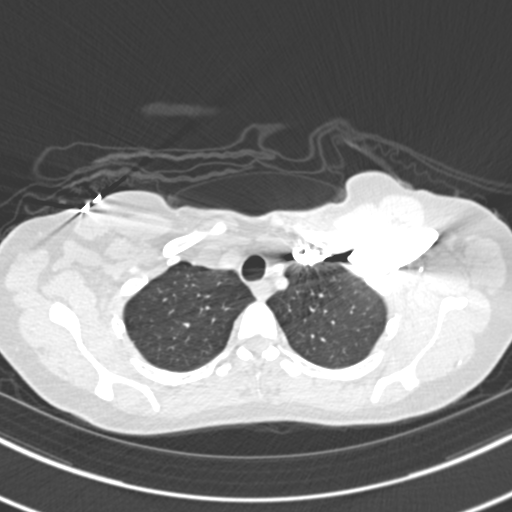
[im 238/283  mediastinal]
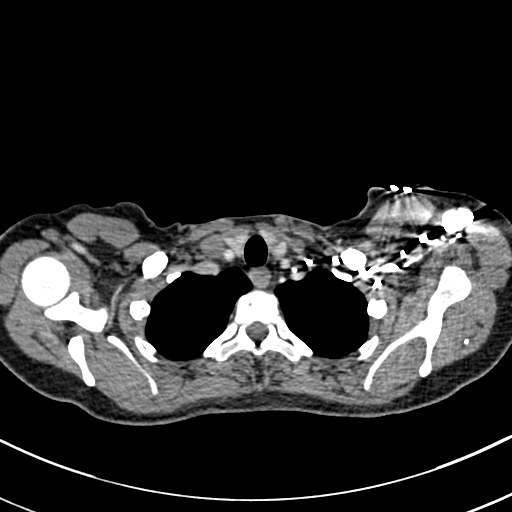
[im 253/283  lung]
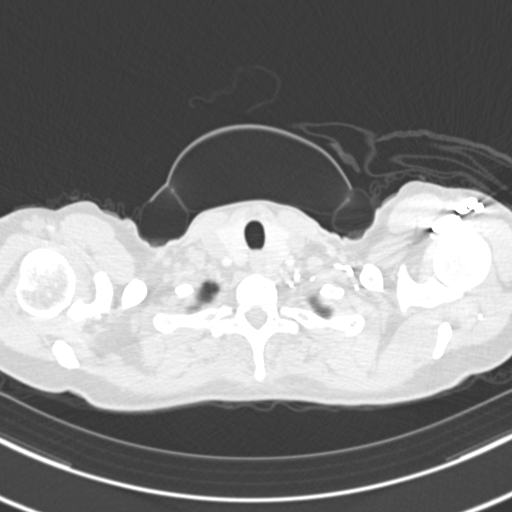
[im 268/283  mediastinal]
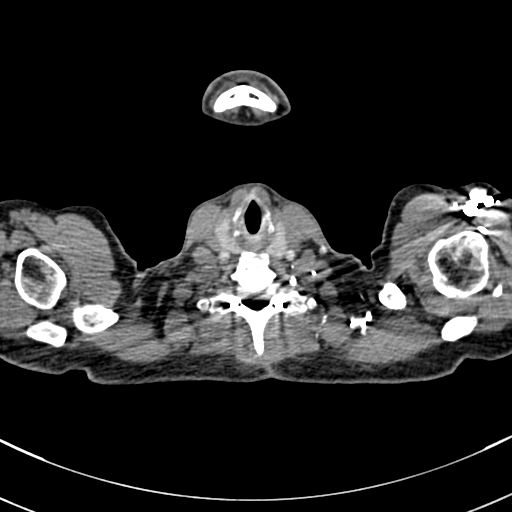

[19 of 36 positions shown; findings below may reference images not displayed]

FINDINGS: Cardiovascular: Normal caliber thoracic aorta. No signs of
pericardial effusion. Heart size is normal.

No signs of central pulmonary embolus. Study limited by respiratory
motion. No signs of segmental emboli within the limitations of the
study. Subsegmental branches not well assessed.

Mediastinum/Nodes: No enlarged mediastinal, hilar, or axillary lymph
nodes. Thyroid gland, trachea, and esophagus demonstrate no
significant findings.

Lungs/Pleura: Lungs are clear, no signs of consolidation or pleural
effusion. Airways are patent. No asymmetric elevation of left or
right hemidiaphragm.

Upper Abdomen: Signs of cholecystectomy, partially imaged. No acute
findings in the visualized portion of the abdomen. Post gastric
bypass.

Musculoskeletal: Signs of lower cervical spinal fusion are partially
imaged. No signs of acute bone finding or destructive bone process.

Review of the MIP images confirms the above findings.
IMPRESSION: 1. Study limited by respiratory motion. No central pulmonary embolus
identified. Segmental and subsegmental branches particularly in the
lower lobes are not well assessed.
2. No signs of acute finding in the chest.

## 2021-08-14 ENCOUNTER — Other Ambulatory Visit: Payer: Self-pay

## 2021-08-14 ENCOUNTER — Ambulatory Visit (INDEPENDENT_AMBULATORY_CARE_PROVIDER_SITE_OTHER): Payer: Medicare HMO | Admitting: *Deleted

## 2021-08-14 VITALS — BP 120/79 | HR 79 | Temp 98.2°F | Resp 18

## 2021-08-14 DIAGNOSIS — J4550 Severe persistent asthma, uncomplicated: Secondary | ICD-10-CM

## 2021-08-14 MED ORDER — DUPILUMAB 300 MG/2ML ~~LOC~~ SOSY
300.0000 mg | PREFILLED_SYRINGE | Freq: Once | SUBCUTANEOUS | Status: AC
  Administered 2021-08-14: 300 mg via SUBCUTANEOUS
  Filled 2021-08-14: qty 2

## 2021-08-14 MED ORDER — SODIUM CHLORIDE 0.9 % IV SOLN: Freq: Once | INTRAVENOUS | Status: DC | PRN

## 2021-08-14 MED ORDER — EPINEPHRINE 0.3 MG/0.3ML IJ SOAJ: 0.3000 mg | Freq: Once | INTRAMUSCULAR | Status: DC | PRN

## 2021-08-14 MED ORDER — ALBUTEROL SULFATE HFA 108 (90 BASE) MCG/ACT IN AERS: 2.0000 | INHALATION_SPRAY | Freq: Once | RESPIRATORY_TRACT | Status: DC | PRN

## 2021-08-14 MED ORDER — METHYLPREDNISOLONE SODIUM SUCC 125 MG IJ SOLR: 125.0000 mg | Freq: Once | INTRAMUSCULAR | Status: DC | PRN

## 2021-08-14 MED ORDER — DIPHENHYDRAMINE HCL 50 MG/ML IJ SOLN: 50.0000 mg | Freq: Once | INTRAMUSCULAR | Status: DC | PRN

## 2021-08-14 MED ORDER — FAMOTIDINE IN NACL 20-0.9 MG/50ML-% IV SOLN: 20.0000 mg | Freq: Once | INTRAVENOUS | Status: DC | PRN

## 2021-08-14 NOTE — Progress Notes (Signed)
Diagnosis: Asthma  Provider:  Praveen Mannam, MD  Procedure: Injection  Dupixent (Dupilumab), Dose: 300 mg, Site: subcutaneous  Discharge: Condition: Good, Destination: Home . AVS provided to patient.   Performed by:  Kalianna Verbeke A, RN        

## 2021-08-15 ENCOUNTER — Other Ambulatory Visit (HOSPITAL_COMMUNITY): Payer: Self-pay

## 2021-08-28 ENCOUNTER — Ambulatory Visit (INDEPENDENT_AMBULATORY_CARE_PROVIDER_SITE_OTHER): Payer: Medicare HMO

## 2021-08-28 ENCOUNTER — Encounter: Payer: Self-pay | Admitting: Internal Medicine

## 2021-08-28 ENCOUNTER — Ambulatory Visit (INDEPENDENT_AMBULATORY_CARE_PROVIDER_SITE_OTHER): Payer: Medicare HMO | Admitting: Internal Medicine

## 2021-08-28 ENCOUNTER — Other Ambulatory Visit: Payer: Self-pay

## 2021-08-28 VITALS — BP 110/68 | HR 67 | Temp 98.3°F | Ht 62.0 in | Wt 136.0 lb

## 2021-08-28 VITALS — BP 117/76 | HR 63 | Temp 98.2°F | Resp 20 | Wt 136.0 lb

## 2021-08-28 DIAGNOSIS — J4550 Severe persistent asthma, uncomplicated: Secondary | ICD-10-CM

## 2021-08-28 DIAGNOSIS — Z23 Encounter for immunization: Secondary | ICD-10-CM

## 2021-08-28 DIAGNOSIS — J310 Chronic rhinitis: Secondary | ICD-10-CM | POA: Diagnosis not present

## 2021-08-28 DIAGNOSIS — J449 Chronic obstructive pulmonary disease, unspecified: Secondary | ICD-10-CM | POA: Diagnosis not present

## 2021-08-28 MED ORDER — DUPILUMAB 300 MG/2ML ~~LOC~~ SOSY
300.0000 mg | PREFILLED_SYRINGE | Freq: Once | SUBCUTANEOUS | Status: AC
  Administered 2021-08-28: 300 mg via SUBCUTANEOUS
  Filled 2021-08-28: qty 2

## 2021-08-28 MED ORDER — DIPHENHYDRAMINE HCL 50 MG/ML IJ SOLN: 50.0000 mg | Freq: Once | INTRAMUSCULAR | Status: DC | PRN

## 2021-08-28 MED ORDER — FAMOTIDINE IN NACL 20-0.9 MG/50ML-% IV SOLN: 20.0000 mg | Freq: Once | INTRAVENOUS | Status: DC | PRN

## 2021-08-28 MED ORDER — SODIUM CHLORIDE 0.9 % IV SOLN: Freq: Once | INTRAVENOUS | Status: DC | PRN

## 2021-08-28 MED ORDER — METHYLPREDNISOLONE SODIUM SUCC 125 MG IJ SOLR: 125.0000 mg | Freq: Once | INTRAMUSCULAR | Status: DC | PRN

## 2021-08-28 MED ORDER — ALBUTEROL SULFATE HFA 108 (90 BASE) MCG/ACT IN AERS: 2.0000 | INHALATION_SPRAY | Freq: Once | RESPIRATORY_TRACT | Status: DC | PRN

## 2021-08-28 MED ORDER — LORATADINE 10 MG PO TABS: 10.0000 mg | ORAL_TABLET | Freq: Every day | ORAL | 11 refills | Status: DC

## 2021-08-28 MED ORDER — EPINEPHRINE 0.3 MG/0.3ML IJ SOAJ: 0.3000 mg | Freq: Once | INTRAMUSCULAR | Status: DC | PRN

## 2021-08-28 NOTE — Patient Instructions (Signed)
Please schedule follow up scheduled with myself in 4 months.  If my schedule is not open yet, we will contact you with a reminder closer to that time.  Referring you to pulmonary rehab - they will call you to schedule.   Starting claritin for your drainage.  Keep up breztri and dupixent and albuterol as needed.

## 2021-08-28 NOTE — Progress Notes (Signed)
Diagnosis: Asthma  Provider:  Praveen Mannam, MD  Procedure: Injection  Dupixent (Dupilumab), Dose: 300 mg, Site: subcutaneous  Discharge: Condition: Good, Destination: Home . AVS provided to patient.   Performed by:  Arjuna Doeden N Zona Pedro, RN        

## 2021-08-28 NOTE — Progress Notes (Signed)
Leslie Sanders    [id number]    [birth year]  Primary Care Physician:Sundaragiri, Oren Beckmann, MD Date of Appointment: 08/28/2021 Established Patient Visit  Chief complaint:   Chief Complaint  Patient presents with   Follow-up    Received Dupixent injection today.  Sob with physical activity.  Gets tired easily.  Walking and getting on the bike.  Allergies acting up (sneezing, running nose).     HPI: Leslie Sanders is a 45 y.o. 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 On dupilumab. She has a history of childhood asthma. Gotten worse as an adult. Last felt well controlled as a teenager.   Interval Updates: Had covid since her last visit. Went to the ED - was not admitted - treated with paxlovid.  Recently started iron infusions and this is helping her energy/fatigue.  Feels sob with daily activities around the house.  Never started claritin  Current Regimen: Breztri BID, singulair, dupixent (since July 2021) flonase, albuterol Asthma Triggers: exertion, pollen, dust, fragrances Exacerbations in the last year: taken prednisone 6 times in the last 6 months.  History of hospitalization or intubation: never  Allergy Testing: serum IgE 01/2020 was 20 GERD: yes not on any therapy Allergic Rhinitis: yes, she takes flonase, singulair, not on any antihistamines ACT:  Asthma Control Test ACT Total Score  08/28/2021 12  02/12/2021 10  08/15/2020 9   FeNO: never had  I have reviewed the patient's family social and past medical history and updated as appropriate.   Past Medical History:  Diagnosis Date   Anemia    (a) chronic blood loss hx of menorrhagia s/p endometrial biopsy 04/2015 (b) iron deficient   Anxiety    B12 deficiency    Bipolar 1 disorder ([place])    Cervical spinal stenosis    Cervical spondylosis without myelopathy 07/08/2016   Chronic pain of right thumb 04/28/2018   Depression    GERD (gastroesophageal reflux disease)    Hemorrhoids     Pt with constipation secondary to iron supplement. Pt has had rectal bleed secondary to internal hemorrhoids. 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   Migraine    PONV (postoperative nausea and vomiting)    PTSD (post-traumatic stress disorder)    Seasonal allergies    Vitamin E deficiency    Zinc deficiency     Past Surgical History:  Procedure Laterality Date   ABDOMINAL HYSTERECTOMY     BREAST LUMPECTOMY Bilateral    BREAST REDUCTION SURGERY     Carpal Metacarpal Arthroplasty Right 05/13/2019   CHOLECYSTECTOMY  1990   HAND SURGERY     HYSTERECTOMY ABDOMINAL WITH SALPINGECTOMY  11/2017   POSTERIOR CERVICAL LAMINECTOMY Bilateral 05/23/2019   RIGHT/LEFT HEART CATH AND CORONARY ANGIOGRAPHY N/A 01/13/2020   Procedure: RIGHT/LEFT HEART CATH AND CORONARY ANGIOGRAPHY;  Surgeon: Belva Crome, MD;  Location: [facility];  Service: Cardiovascular;  Laterality: N/A;   ROUX-EN-Y GASTRIC BYPASS  2003   in [place]  05/23/2019    Family History  Problem Relation Age of Onset   Schizophrenia Maternal Aunt    ADD / ADHD Son    ODD Son    Asthma Son    Breast cancer Mother    Arthritis/Rheumatoid Mother    Lupus Mother    HIV Father    Rheum arthritis Maternal Grandmother    COPD Maternal Grandmother  COPD Paternal Grandmother     Social History   Occupational History   Not on file  Tobacco Use   Smoking status: Former    Packs/day: 1.00    Years: 15.00    Pack years: 15.00    Types: Cigarettes    Quit date: 12/08/2018    Years since quitting: 2.7   Smokeless tobacco: Never  Vaping Use   Vaping Use: Never used  Substance and Sexual Activity   Alcohol use: Not Currently    Alcohol/week: 28.0 standard drinks    Types: 28 Cans of beer per week    Comment: Nothing in 4 years   Drug use: No   Sexual activity: Not Currently     Physical Exam: Blood pressure 110/68, pulse 67, temperature 98.3 F (36.8 C),  temperature source Oral, height _0  (1.575 m), weight 136 lb (61.7 kg), last menstrual period 10/07/2016, SpO2 99 %.  Gen:      No acute distress Lungs:    No increased respiratory effort, symmetric chest wall excursion, clear to auscultation bilaterally, no wheezes or crackles CV:         Regular rate and rhythm; no murmurs, rubs, or gallops.  No pedal edema   Data Reviewed: Imaging: I have personally reviewed the chest xray 01/2021 which shows no acute pulmonary process.  Sniff test 11/2019 shows normal diarphgram movement  PFTs:   PFT Results Latest Ref Rng & Units 05/28/2020 03/14/2020 03/14/2020 11/25/2019  FVC-Pre L 2.27 2.11 2.11 2.10  FVC-Predicted Pre % 81 75 75 75  FVC-Post L 2.41 2.35 2.35 2.24  FVC-Predicted Post % 86 84 84 80  Pre FEV1/FVC % % 63 66 66 68  Post FEV1/FCV % % 70 74 74 75  FEV1-Pre L 1.43 1.38 1.38 1.42  FEV1-Predicted Pre % 62 60 60 62  FEV1-Post L 1.69 1.73 1.73 1.67  DLCO uncorrected ml/min/mmHg 16.47 16.53 16.53 14.36  DLCO UNC% % 81 82 82 71  DLCO corrected ml/min/mmHg 16.47 16.53 16.53 -  DLCO COR %Predicted % 81 82 82 -  DLVA Predicted % 106 103 103 89  TLC L 3.94 4.08 4.08 4.03  TLC % Predicted % 82 85 85 84  RV % Predicted % 113 119 119 123   I have personally reviewed the patient's PFTs and and they show moderate airflow limitation with a positive BD response.   Labs:  Immunization status: Immunization History  Administered Date(s) Administered   Influenza,inj,Quad PF,6+ Mos 12/21/2017, 08/31/2019   Influenza,inj,Quad PF,6-35 Mos 08/20/2020   Influenza-Unspecified 08/23/2015, 12/21/2017, 10/08/2018, 08/31/2019   MMR 07/24/2015   PFIZER(Purple Top)SARS-COV-2 Vaccination 03/02/2020, 03/27/2020, 07/31/2020   PPD Test 03/14/2016   Pneumococcal Conjugate-13 10/31/2019   Pneumococcal Polysaccharide-23 12/21/2017   Tdap 07/24/2015    Assessment:  Severe persistent asthma copd overlap syndrome, stable Panic disorder with  depression Chronic allergic rhinitis  Plan/Recommendations: Continue breztri, Continue albuterol prn. Continue dupilumab injections Will refer to pulmonary rehab - this didn't happen last time.   Continue flonase. Start loratidine  Will refer for pulmonary rehabilitation at [facility].    We will give her flu shot today.    Return to Care: Return in about 4 months (around 12/28/2021).   Lenice Llamas, MD Pulmonary and [place]

## 2021-08-29 ENCOUNTER — Encounter (HOSPITAL_COMMUNITY): Payer: Self-pay | Admitting: *Deleted

## 2021-08-29 NOTE — Progress Notes (Signed)
Received second referral from Dr. Celine Mans for this pt to participate in pulmonary rehab with the diagnosis of Asthma.  Pt previously referred back in May - pt declined to participate at that time due to gas prices. Clinical review of pt follow up appt on 9/21 Pulmonary office note.  Pt with Covid Risk Score - 4. Pt appropriate for scheduling for Pulmonary rehab.  Will forward to support staff for scheduling and verification of insurance eligibility/benefits with pt consent. Alanson Aly, BSN Cardiac and Emergency planning/management officer

## 2021-09-04 ENCOUNTER — Other Ambulatory Visit (HOSPITAL_COMMUNITY): Payer: Self-pay

## 2021-09-04 ENCOUNTER — Other Ambulatory Visit: Payer: Self-pay | Admitting: Internal Medicine

## 2021-09-04 DIAGNOSIS — J4550 Severe persistent asthma, uncomplicated: Secondary | ICD-10-CM

## 2021-09-06 ENCOUNTER — Other Ambulatory Visit (HOSPITAL_COMMUNITY): Payer: Self-pay

## 2021-09-06 MED ORDER — DUPIXENT 300 MG/2ML ~~LOC~~ SOSY
300.0000 mg | PREFILLED_SYRINGE | SUBCUTANEOUS | 2 refills | Status: DC
Start: 2021-09-06 — End: 2021-11-19
  Filled 2021-09-06: qty 4, 28d supply, fill #0
  Filled 2021-10-02: qty 4, 28d supply, fill #1
  Filled 2021-10-30: qty 4, 28d supply, fill #2

## 2021-09-11 ENCOUNTER — Other Ambulatory Visit: Payer: Self-pay

## 2021-09-11 ENCOUNTER — Ambulatory Visit (INDEPENDENT_AMBULATORY_CARE_PROVIDER_SITE_OTHER): Payer: Medicare HMO | Admitting: *Deleted

## 2021-09-11 VITALS — BP 111/78 | HR 80 | Temp 98.2°F | Resp 18 | Ht 62.0 in | Wt 137.0 lb

## 2021-09-11 DIAGNOSIS — J4550 Severe persistent asthma, uncomplicated: Secondary | ICD-10-CM

## 2021-09-11 MED ORDER — ALBUTEROL SULFATE HFA 108 (90 BASE) MCG/ACT IN AERS: 2.0000 | INHALATION_SPRAY | Freq: Once | RESPIRATORY_TRACT | Status: DC | PRN

## 2021-09-11 MED ORDER — DIPHENHYDRAMINE HCL 50 MG/ML IJ SOLN: 50.0000 mg | Freq: Once | INTRAMUSCULAR | Status: DC | PRN

## 2021-09-11 MED ORDER — DUPILUMAB 300 MG/2ML ~~LOC~~ SOSY
300.0000 mg | PREFILLED_SYRINGE | Freq: Once | SUBCUTANEOUS | Status: AC
  Administered 2021-09-11: 300 mg via SUBCUTANEOUS
  Filled 2021-09-11: qty 2

## 2021-09-11 MED ORDER — EPINEPHRINE 0.3 MG/0.3ML IJ SOAJ: 0.3000 mg | Freq: Once | INTRAMUSCULAR | Status: DC | PRN

## 2021-09-11 MED ORDER — METHYLPREDNISOLONE SODIUM SUCC 125 MG IJ SOLR: 125.0000 mg | Freq: Once | INTRAMUSCULAR | Status: DC | PRN

## 2021-09-11 MED ORDER — SODIUM CHLORIDE 0.9 % IV SOLN: Freq: Once | INTRAVENOUS | Status: DC | PRN

## 2021-09-11 MED ORDER — FAMOTIDINE IN NACL 20-0.9 MG/50ML-% IV SOLN: 20.0000 mg | Freq: Once | INTRAVENOUS | Status: DC | PRN

## 2021-09-11 NOTE — Progress Notes (Signed)
Diagnosis: Asthma  Provider:  Praveen Mannam, MD  Procedure: Injection  Dupixent (Dupilumab), Dose: 300 mg, Site: subcutaneous  Discharge: Condition: Good, Destination: Home . AVS provided to patient.   Performed by:  Zacharee Gaddie A, RN        

## 2021-09-13 ENCOUNTER — Ambulatory Visit: Payer: Medicare HMO | Attending: Internal Medicine

## 2021-09-13 DIAGNOSIS — Z23 Encounter for immunization: Secondary | ICD-10-CM

## 2021-09-13 NOTE — Progress Notes (Signed)
   Covid-19 Vaccination Clinic  Name:  Leslie Sanders    MRN: [id number] DOB: May 17, 1976  09/13/2021  [name] was observed post Covid-19 immunization for 15 minutes without incident. She was provided with Vaccine Information Sheet and instruction to access the V-Safe system.   [name] was instructed to call 911 with any severe reactions post vaccine: Difficulty breathing  Swelling of face and throat  A fast heartbeat  A bad rash all over body  Dizziness and weakness

## 2021-09-16 ENCOUNTER — Other Ambulatory Visit (HOSPITAL_COMMUNITY): Payer: Self-pay

## 2021-09-18 ENCOUNTER — Telehealth (HOSPITAL_COMMUNITY): Payer: Self-pay

## 2021-09-18 ENCOUNTER — Encounter (HOSPITAL_COMMUNITY): Payer: Self-pay

## 2021-09-18 NOTE — Telephone Encounter (Signed)
Pt insurance is active and benefits verified through [facility]. Co-pay $0.00, DED $0.00/$0.00 met, out of pocket $0.00/$0.00 met, co-insurance 0%. No pre-authorization required. [employer], 09/12/21 @ 310PM, MSX#[id number]   Will contact patient to see if she is interested in the Pulmonary Rehab Program.

## 2021-09-18 NOTE — Telephone Encounter (Signed)
Attempted to contact pt in regards to PR, LMTCB.  Mailed letter 

## 2021-09-24 ENCOUNTER — Other Ambulatory Visit (HOSPITAL_BASED_OUTPATIENT_CLINIC_OR_DEPARTMENT_OTHER): Payer: Self-pay

## 2021-09-24 MED ORDER — COVID-19MRNA BIVAL VACC PFIZER 30 MCG/0.3ML IM SUSP
INTRAMUSCULAR | 0 refills | Status: AC
  Filled 2021-09-24: qty 0.3, 1d supply, fill #0

## 2021-09-25 ENCOUNTER — Other Ambulatory Visit: Payer: Self-pay

## 2021-09-25 ENCOUNTER — Ambulatory Visit (INDEPENDENT_AMBULATORY_CARE_PROVIDER_SITE_OTHER): Payer: Medicare HMO | Admitting: *Deleted

## 2021-09-25 VITALS — BP 111/73 | HR 76 | Temp 98.6°F | Resp 16 | Ht 62.0 in | Wt 139.0 lb

## 2021-09-25 DIAGNOSIS — J4550 Severe persistent asthma, uncomplicated: Secondary | ICD-10-CM

## 2021-09-25 MED ORDER — METHYLPREDNISOLONE SODIUM SUCC 125 MG IJ SOLR: 125.0000 mg | Freq: Once | INTRAMUSCULAR | Status: DC | PRN

## 2021-09-25 MED ORDER — DIPHENHYDRAMINE HCL 50 MG/ML IJ SOLN: 50.0000 mg | Freq: Once | INTRAMUSCULAR | Status: DC | PRN

## 2021-09-25 MED ORDER — FAMOTIDINE IN NACL 20-0.9 MG/50ML-% IV SOLN: 20.0000 mg | Freq: Once | INTRAVENOUS | Status: DC | PRN

## 2021-09-25 MED ORDER — EPINEPHRINE 0.3 MG/0.3ML IJ SOAJ: 0.3000 mg | Freq: Once | INTRAMUSCULAR | Status: DC | PRN

## 2021-09-25 MED ORDER — DUPILUMAB 300 MG/2ML ~~LOC~~ SOSY
300.0000 mg | PREFILLED_SYRINGE | Freq: Once | SUBCUTANEOUS | Status: AC
  Administered 2021-09-25: 300 mg via SUBCUTANEOUS
  Filled 2021-09-25: qty 2

## 2021-09-25 MED ORDER — SODIUM CHLORIDE 0.9 % IV SOLN: Freq: Once | INTRAVENOUS | Status: DC | PRN

## 2021-09-25 MED ORDER — ALBUTEROL SULFATE HFA 108 (90 BASE) MCG/ACT IN AERS: 2.0000 | INHALATION_SPRAY | Freq: Once | RESPIRATORY_TRACT | Status: DC | PRN

## 2021-09-25 NOTE — Progress Notes (Signed)
Diagnosis: Asthma  Provider:  Praveen Mannam, MD  Procedure: Injection  Dupixent (Dupilumab), Dose: 300 mg, Site: subcutaneous  Discharge: Condition: Good, Destination: Home . AVS provided to patient.   Performed by:  Tab Rylee A, RN        

## 2021-09-27 ENCOUNTER — Other Ambulatory Visit (HOSPITAL_COMMUNITY): Payer: Self-pay

## 2021-10-02 ENCOUNTER — Other Ambulatory Visit (HOSPITAL_COMMUNITY): Payer: Self-pay

## 2021-10-03 ENCOUNTER — Other Ambulatory Visit (HOSPITAL_COMMUNITY): Payer: Self-pay

## 2021-10-08 ENCOUNTER — Other Ambulatory Visit: Payer: Self-pay | Admitting: Interventional Cardiology

## 2021-10-08 DIAGNOSIS — R002 Palpitations: Secondary | ICD-10-CM

## 2021-10-09 ENCOUNTER — Other Ambulatory Visit: Payer: Self-pay

## 2021-10-09 ENCOUNTER — Ambulatory Visit (INDEPENDENT_AMBULATORY_CARE_PROVIDER_SITE_OTHER): Payer: Medicare HMO | Admitting: *Deleted

## 2021-10-09 VITALS — BP 130/90 | HR 78 | Temp 98.3°F | Resp 18 | Ht 62.0 in | Wt 135.8 lb

## 2021-10-09 DIAGNOSIS — J4550 Severe persistent asthma, uncomplicated: Secondary | ICD-10-CM

## 2021-10-09 MED ORDER — METHYLPREDNISOLONE SODIUM SUCC 125 MG IJ SOLR: 125.0000 mg | Freq: Once | INTRAMUSCULAR | Status: DC | PRN

## 2021-10-09 MED ORDER — SODIUM CHLORIDE 0.9 % IV SOLN: Freq: Once | INTRAVENOUS | Status: DC | PRN

## 2021-10-09 MED ORDER — DIPHENHYDRAMINE HCL 50 MG/ML IJ SOLN: 50.0000 mg | Freq: Once | INTRAMUSCULAR | Status: DC | PRN

## 2021-10-09 MED ORDER — EPINEPHRINE 0.3 MG/0.3ML IJ SOAJ: 0.3000 mg | Freq: Once | INTRAMUSCULAR | Status: DC | PRN

## 2021-10-09 MED ORDER — DUPILUMAB 300 MG/2ML ~~LOC~~ SOSY
300.0000 mg | PREFILLED_SYRINGE | Freq: Once | SUBCUTANEOUS | Status: AC
  Administered 2021-10-09: 300 mg via SUBCUTANEOUS
  Filled 2021-10-09: qty 2

## 2021-10-09 MED ORDER — FAMOTIDINE IN NACL 20-0.9 MG/50ML-% IV SOLN: 20.0000 mg | Freq: Once | INTRAVENOUS | Status: DC | PRN

## 2021-10-09 MED ORDER — ALBUTEROL SULFATE HFA 108 (90 BASE) MCG/ACT IN AERS: 2.0000 | INHALATION_SPRAY | Freq: Once | RESPIRATORY_TRACT | Status: DC | PRN

## 2021-10-09 NOTE — Progress Notes (Signed)
Diagnosis: Asthma  Provider:  Praveen Mannam, MD  Procedure: Injection  Dupixent (Dupilumab), Dose: 300 mg, Site: subcutaneous  Discharge: Condition: Good, Destination: Home . AVS provided to patient.   Performed by:  Brittin Belnap A, RN        

## 2021-10-23 ENCOUNTER — Ambulatory Visit (INDEPENDENT_AMBULATORY_CARE_PROVIDER_SITE_OTHER): Payer: Medicare HMO | Admitting: *Deleted

## 2021-10-23 ENCOUNTER — Other Ambulatory Visit: Payer: Self-pay

## 2021-10-23 VITALS — BP 125/85 | HR 87 | Temp 98.3°F | Resp 20 | Ht 62.0 in | Wt 136.6 lb

## 2021-10-23 DIAGNOSIS — J4550 Severe persistent asthma, uncomplicated: Secondary | ICD-10-CM | POA: Diagnosis not present

## 2021-10-23 MED ORDER — EPINEPHRINE 0.3 MG/0.3ML IJ SOAJ: 0.3000 mg | Freq: Once | INTRAMUSCULAR | Status: DC | PRN

## 2021-10-23 MED ORDER — ALBUTEROL SULFATE HFA 108 (90 BASE) MCG/ACT IN AERS: 2.0000 | INHALATION_SPRAY | Freq: Once | RESPIRATORY_TRACT | Status: DC | PRN

## 2021-10-23 MED ORDER — FAMOTIDINE IN NACL 20-0.9 MG/50ML-% IV SOLN: 20.0000 mg | Freq: Once | INTRAVENOUS | Status: DC | PRN

## 2021-10-23 MED ORDER — METHYLPREDNISOLONE SODIUM SUCC 125 MG IJ SOLR: 125.0000 mg | Freq: Once | INTRAMUSCULAR | Status: DC | PRN

## 2021-10-23 MED ORDER — DUPILUMAB 300 MG/2ML ~~LOC~~ SOSY
300.0000 mg | PREFILLED_SYRINGE | Freq: Once | SUBCUTANEOUS | Status: AC
  Administered 2021-10-23: 300 mg via SUBCUTANEOUS
  Filled 2021-10-23: qty 2

## 2021-10-23 MED ORDER — SODIUM CHLORIDE 0.9 % IV SOLN: Freq: Once | INTRAVENOUS | Status: DC | PRN

## 2021-10-23 MED ORDER — DIPHENHYDRAMINE HCL 50 MG/ML IJ SOLN: 50.0000 mg | Freq: Once | INTRAMUSCULAR | Status: DC | PRN

## 2021-10-23 NOTE — Progress Notes (Signed)
Diagnosis: Asthma  Provider:  Praveen Mannam, MD  Procedure: Injection  Dupixent (Dupilumab), Dose: 300 mg, Site: subcutaneous  Discharge: Condition: Good, Destination: Home . AVS provided to patient.   Performed by:  Mccabe Gloria A, RN        

## 2021-10-28 ENCOUNTER — Other Ambulatory Visit (HOSPITAL_COMMUNITY): Payer: Self-pay

## 2021-10-30 ENCOUNTER — Other Ambulatory Visit (HOSPITAL_COMMUNITY): Payer: Self-pay

## 2021-11-01 ENCOUNTER — Other Ambulatory Visit (HOSPITAL_COMMUNITY): Payer: Self-pay

## 2021-11-06 ENCOUNTER — Ambulatory Visit (INDEPENDENT_AMBULATORY_CARE_PROVIDER_SITE_OTHER): Payer: Medicare HMO

## 2021-11-06 ENCOUNTER — Other Ambulatory Visit: Payer: Self-pay

## 2021-11-06 VITALS — BP 111/76 | HR 66 | Temp 98.7°F | Resp 16 | Ht 62.0 in | Wt 138.0 lb

## 2021-11-06 DIAGNOSIS — J4550 Severe persistent asthma, uncomplicated: Secondary | ICD-10-CM

## 2021-11-06 MED ORDER — SODIUM CHLORIDE 0.9 % IV SOLN: Freq: Once | INTRAVENOUS | Status: DC | PRN

## 2021-11-06 MED ORDER — DIPHENHYDRAMINE HCL 50 MG/ML IJ SOLN: 50.0000 mg | Freq: Once | INTRAMUSCULAR | Status: DC | PRN

## 2021-11-06 MED ORDER — FAMOTIDINE IN NACL 20-0.9 MG/50ML-% IV SOLN: 20.0000 mg | Freq: Once | INTRAVENOUS | Status: DC | PRN

## 2021-11-06 MED ORDER — DUPILUMAB 300 MG/2ML ~~LOC~~ SOSY
300.0000 mg | PREFILLED_SYRINGE | Freq: Once | SUBCUTANEOUS | Status: AC
  Administered 2021-11-06: 300 mg via SUBCUTANEOUS
  Filled 2021-11-06: qty 2

## 2021-11-06 MED ORDER — METHYLPREDNISOLONE SODIUM SUCC 125 MG IJ SOLR: 125.0000 mg | Freq: Once | INTRAMUSCULAR | Status: DC | PRN

## 2021-11-06 MED ORDER — EPINEPHRINE 0.3 MG/0.3ML IJ SOAJ: 0.3000 mg | Freq: Once | INTRAMUSCULAR | Status: DC | PRN

## 2021-11-06 MED ORDER — ALBUTEROL SULFATE HFA 108 (90 BASE) MCG/ACT IN AERS: 2.0000 | INHALATION_SPRAY | Freq: Once | RESPIRATORY_TRACT | Status: DC | PRN

## 2021-11-06 NOTE — Progress Notes (Signed)
Diagnosis: Asthma  Provider:  Praveen Mannam, MD  Procedure: Injection  Dupixent (Dupilumab), Dose: 300 mg, Site: subcutaneous  Discharge: Condition: Good, Destination: Home . AVS provided to patient.   Performed by:  Karin Pinedo E, LPN        

## 2021-11-11 ENCOUNTER — Telehealth: Payer: Self-pay | Admitting: Pharmacy Technician

## 2021-11-11 NOTE — Telephone Encounter (Addendum)
error 

## 2021-11-12 ENCOUNTER — Other Ambulatory Visit (HOSPITAL_COMMUNITY): Payer: Self-pay

## 2021-11-13 ENCOUNTER — Telehealth: Payer: Self-pay | Admitting: Pharmacy Technician

## 2021-11-13 ENCOUNTER — Other Ambulatory Visit: Payer: Self-pay | Admitting: Pharmacy Technician

## 2021-11-13 NOTE — Telephone Encounter (Signed)
Auth Submission: PA RENEWAL- APPROVED New therapy plan needed. Payer: [employer] Medication & CPT/J Code(s) submitted: DUPIXENT Route of submission (phone, fax, portal): COVER MY MEDS Auth type: Pharmacy Benefit Units/visits requested: 300 MG Q14 DAYS Reference number: [id number] Approval from: 11/13/21 to 12/07/22

## 2021-11-13 NOTE — Telephone Encounter (Signed)
Auth Submission: PA RENEWAL - PENDING Payer: [employer] Medication & CPT/J Code(s) submitted: DUPIXENT  Route of submission (phone, fax, portal): COVER MY MEDS Auth type: Pharmacy Benefit Units/visits requested: 300 MG SYG Q14 DAYS Reference number: [id number]   Will update once we receive a response.

## 2021-11-19 ENCOUNTER — Other Ambulatory Visit (HOSPITAL_COMMUNITY): Payer: Self-pay

## 2021-11-19 ENCOUNTER — Other Ambulatory Visit: Payer: Self-pay | Admitting: Internal Medicine

## 2021-11-19 DIAGNOSIS — J4550 Severe persistent asthma, uncomplicated: Secondary | ICD-10-CM

## 2021-11-19 MED ORDER — DUPIXENT 300 MG/2ML ~~LOC~~ SOSY
300.0000 mg | PREFILLED_SYRINGE | SUBCUTANEOUS | 2 refills | Status: DC
  Filled 2021-11-19: qty 4, 28d supply, fill #0
  Filled 2021-12-17: qty 4, 28d supply, fill #1
  Filled 2022-01-14: qty 4, 28d supply, fill #2

## 2021-11-19 NOTE — Telephone Encounter (Signed)
Refill sent for DUPIXENT to [facility]: [phone]   Dose: 300 mg SQ every 14 days  Last OV: 08/28/21 Provider: Dr. Celine Mans  Next OV: not yet scheduled but per her last OV, patient was to be seen in January 2023. Called patient and advised she stop by front desk after her appointment at infusion center tomorrow to schedule f/u appt with Dr. Celine Mans in January 2022.  Chesley Mires, PharmD, MPH, BCPS Clinical Pharmacist (Rheumatology and Pulmonology)

## 2021-11-20 ENCOUNTER — Ambulatory Visit (INDEPENDENT_AMBULATORY_CARE_PROVIDER_SITE_OTHER): Payer: Medicare HMO

## 2021-11-20 ENCOUNTER — Other Ambulatory Visit: Payer: Self-pay | Admitting: Critical Care Medicine

## 2021-11-20 ENCOUNTER — Other Ambulatory Visit (HOSPITAL_COMMUNITY): Payer: Self-pay

## 2021-11-20 ENCOUNTER — Other Ambulatory Visit: Payer: Self-pay

## 2021-11-20 VITALS — BP 120/71 | HR 80 | Temp 97.6°F | Resp 20 | Ht 62.0 in | Wt 137.4 lb

## 2021-11-20 DIAGNOSIS — J4550 Severe persistent asthma, uncomplicated: Secondary | ICD-10-CM

## 2021-11-20 MED ORDER — DUPILUMAB 300 MG/2ML ~~LOC~~ SOSY
300.0000 mg | PREFILLED_SYRINGE | Freq: Once | SUBCUTANEOUS | Status: AC
  Administered 2021-11-20: 13:00:00 300 mg via SUBCUTANEOUS
  Filled 2021-11-20: qty 2

## 2021-11-20 NOTE — Progress Notes (Signed)
Diagnosis: Asthma  Provider:  Praveen Mannam, MD  Procedure: Injection  Dupixent (Dupilumab), Dose: 300 mg, Site: subcutaneous, left arm. Number of injections: 1  Discharge: Condition: Good, Destination: Home . AVS provided to patient.   Performed by:  Marionna Gonia, RN        

## 2021-12-04 ENCOUNTER — Other Ambulatory Visit: Payer: Self-pay

## 2021-12-04 ENCOUNTER — Ambulatory Visit (INDEPENDENT_AMBULATORY_CARE_PROVIDER_SITE_OTHER): Payer: Medicare HMO

## 2021-12-04 VITALS — BP 119/77 | HR 63 | Temp 98.4°F | Resp 20 | Ht 62.0 in | Wt 136.8 lb

## 2021-12-04 DIAGNOSIS — J4550 Severe persistent asthma, uncomplicated: Secondary | ICD-10-CM

## 2021-12-04 MED ORDER — SODIUM CHLORIDE 0.9 % IV SOLN: Freq: Once | INTRAVENOUS | Status: DC | PRN

## 2021-12-04 MED ORDER — EPINEPHRINE 0.3 MG/0.3ML IJ SOAJ: 0.3000 mg | Freq: Once | INTRAMUSCULAR | Status: DC | PRN

## 2021-12-04 MED ORDER — ALBUTEROL SULFATE HFA 108 (90 BASE) MCG/ACT IN AERS: 2.0000 | INHALATION_SPRAY | Freq: Once | RESPIRATORY_TRACT | Status: DC | PRN

## 2021-12-04 MED ORDER — DIPHENHYDRAMINE HCL 50 MG/ML IJ SOLN: 50.0000 mg | Freq: Once | INTRAMUSCULAR | Status: DC | PRN

## 2021-12-04 MED ORDER — FAMOTIDINE IN NACL 20-0.9 MG/50ML-% IV SOLN: 20.0000 mg | Freq: Once | INTRAVENOUS | Status: DC | PRN

## 2021-12-04 MED ORDER — METHYLPREDNISOLONE SODIUM SUCC 125 MG IJ SOLR: 125.0000 mg | Freq: Once | INTRAMUSCULAR | Status: DC | PRN

## 2021-12-04 MED ORDER — DUPILUMAB 300 MG/2ML ~~LOC~~ SOSY
300.0000 mg | PREFILLED_SYRINGE | Freq: Once | SUBCUTANEOUS | Status: AC
  Administered 2021-12-04: 13:00:00 300 mg via SUBCUTANEOUS
  Filled 2021-12-04: qty 2

## 2021-12-04 NOTE — Progress Notes (Signed)
Diagnosis: Asthma ° °Provider:  Praveen Mannam, MD ° °Procedure: Injection ° °Dupixent (Dupilumab), Dose: 300 mg, Site: subcutaneous, Number of injections: 1 ° °Discharge: Condition: Good, Destination: Home . AVS provided to patient.  ° °Performed by:  Belvia Gotschall E, LPN  ° ° ° °  °

## 2021-12-17 ENCOUNTER — Other Ambulatory Visit (HOSPITAL_COMMUNITY): Payer: Self-pay

## 2021-12-18 ENCOUNTER — Other Ambulatory Visit: Payer: Self-pay | Admitting: Internal Medicine

## 2021-12-18 ENCOUNTER — Ambulatory Visit: Payer: Self-pay

## 2021-12-19 ENCOUNTER — Ambulatory Visit (INDEPENDENT_AMBULATORY_CARE_PROVIDER_SITE_OTHER): Payer: Medicare HMO | Admitting: *Deleted

## 2021-12-19 ENCOUNTER — Ambulatory Visit (INDEPENDENT_AMBULATORY_CARE_PROVIDER_SITE_OTHER): Payer: Medicare HMO | Admitting: Internal Medicine

## 2021-12-19 ENCOUNTER — Other Ambulatory Visit: Payer: Self-pay

## 2021-12-19 ENCOUNTER — Encounter: Payer: Self-pay | Admitting: Internal Medicine

## 2021-12-19 VITALS — BP 100/60 | HR 70 | Temp 97.9°F | Ht 62.0 in | Wt 136.4 lb

## 2021-12-19 VITALS — BP 114/80 | HR 83 | Temp 97.5°F | Resp 16 | Ht 62.0 in | Wt 136.4 lb

## 2021-12-19 DIAGNOSIS — J4550 Severe persistent asthma, uncomplicated: Secondary | ICD-10-CM | POA: Diagnosis not present

## 2021-12-19 DIAGNOSIS — J309 Allergic rhinitis, unspecified: Secondary | ICD-10-CM | POA: Diagnosis not present

## 2021-12-19 DIAGNOSIS — Z87891 Personal history of nicotine dependence: Secondary | ICD-10-CM

## 2021-12-19 MED ORDER — FLUTICASONE PROPIONATE 50 MCG/ACT NA SUSP: 2.0000 | Freq: Every day | NASAL | 11 refills | Status: AC

## 2021-12-19 MED ORDER — MONTELUKAST SODIUM 10 MG PO TABS: 10.0000 mg | ORAL_TABLET | Freq: Every day | ORAL | 5 refills | Status: DC | PRN

## 2021-12-19 MED ORDER — DUPILUMAB 300 MG/2ML ~~LOC~~ SOSY
300.0000 mg | PREFILLED_SYRINGE | Freq: Once | SUBCUTANEOUS | Status: AC
  Administered 2021-12-19: 300 mg via SUBCUTANEOUS
  Filled 2021-12-19: qty 2

## 2021-12-19 NOTE — Progress Notes (Signed)
Diagnosis: Asthma  Provider:  Praveen Mannam, MD  Procedure: Injection  Dupixent (Dupilumab), Dose: 300 mg, Site: subcutaneous, Number of injections: 1  Discharge: Condition: Good, Destination: Home . AVS provided to patient.   Performed by:  Gerline Ratto A, RN       

## 2021-12-19 NOTE — Progress Notes (Signed)
Leslie Sanders    [id number]    1976/11/20  Primary Care Physician:Sundaragiri, Oren Beckmann, MD Date of Appointment: 12/19/2021 Established Patient Visit  Chief complaint:   Chief Complaint  Patient presents with   Follow-up    Asthma-copd Wants to know if she can build up her endurance      HPI: Leslie Sanders is a 46 y.o. 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 On dupilumab. She has a history of childhood asthma. Gotten worse as an adult. Last felt well controlled as a teenager.   Interval Updates: She went to the ED/urgent care a few weeks ago and received steroids for her breathing.   Referred to pulmonary rehab at last appointment they were not able to make contact and mailed her a letter.   Still feeling sob with daily activities including going up the stairs.  Not taking breztri everyday, misses some doses because she forgets it. Taking about 3 times/week.   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   Current Regimen: Breztri BID, singulair, dupixent (since July 2021) flonase, albuterol Asthma Triggers: exertion, pollen, dust, fragrances, dogs Exacerbations in the last year: 1  History of hospitalization or intubation: never  Allergy Testing: serum IgE 01/2020 was 20 GERD: yes not on any therapy Allergic Rhinitis: yes and not well controlled ACT:  Asthma Control Test ACT Total Score  12/19/2021 14  08/28/2021 12  02/12/2021 10   FeNO: never had  I have reviewed the patient's family social and past medical history and updated as appropriate.   Past Medical History:  Diagnosis Date   Anemia    (a) chronic blood loss hx of menorrhagia s/p endometrial biopsy 04/2015 (b) iron deficient   Anxiety    B12 deficiency    Bipolar 1  disorder ([place])    Cervical spinal stenosis    Cervical spondylosis without myelopathy 07/08/2016   Chronic pain of right thumb 04/28/2018   Depression    GERD (gastroesophageal reflux disease)    Hemorrhoids    Pt with constipation secondary to iron supplement. Pt has had rectal bleed secondary to internal hemorrhoids. 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   Migraine    PONV (postoperative nausea and vomiting)    PTSD (post-traumatic stress disorder)    Seasonal allergies    Vitamin E deficiency    Zinc deficiency     Past Surgical History:  Procedure Laterality Date   ABDOMINAL HYSTERECTOMY     BREAST LUMPECTOMY Bilateral    BREAST REDUCTION SURGERY     Carpal Metacarpal Arthroplasty Right 05/13/2019   CHOLECYSTECTOMY  1990   HAND SURGERY     HYSTERECTOMY ABDOMINAL WITH SALPINGECTOMY  11/2017   POSTERIOR CERVICAL LAMINECTOMY Bilateral 05/23/2019   RIGHT/LEFT HEART CATH AND CORONARY ANGIOGRAPHY N/A 01/13/2020   Procedure: RIGHT/LEFT HEART CATH AND CORONARY ANGIOGRAPHY;  Surgeon: Belva Crome, MD;  Location: [facility];  Service: Cardiovascular;  Laterality: N/A;   ROUX-EN-Y GASTRIC BYPASS  2003   in [place]  05/23/2019    Family History  Problem Relation Age of Onset   Schizophrenia Maternal Aunt  ADD / ADHD Son    ODD Son    Asthma Son    Breast cancer Mother    Arthritis/Rheumatoid Mother    Lupus Mother    HIV Father    Rheum arthritis Maternal Grandmother    COPD Maternal Grandmother    COPD Paternal Grandmother     Social History   Occupational History   Not on file  Tobacco Use   Smoking status: Former    Packs/day: 1.00    Years: 15.00    Pack years: 15.00    Types: Cigarettes    Quit date: 12/08/2018    Years since quitting: 3.0   Smokeless tobacco: Never  Vaping Use   Vaping Use: Never used  Substance and Sexual Activity   Alcohol use: Not Currently    Alcohol/week: 28.0 standard  drinks    Types: 28 Cans of beer per week    Comment: Nothing in 4 years   Drug use: No   Sexual activity: Not Currently    Physical Exam: Blood pressure 100/60, pulse 70, temperature 97.9 F (36.6 C), temperature source Oral, height '5\' 2"'  (1.575 m), weight 136 lb 6.4 oz (61.9 kg), last menstrual period 10/07/2016, SpO2 98 %.  Gen:     NAD Lungs:    ctab no wheezes or crackles, no increased work of breathing CV:         RRR no mrg   Data Reviewed: Imaging: I have personally reviewed the chest xray 01/2021 which shows no acute pulmonary process.  Sniff test 11/2019 shows normal diarphgram movement  PFTs:   PFT Results Latest Ref Rng & Units 05/28/2020 03/14/2020 03/14/2020 11/25/2019  FVC-Pre L 2.27 2.11 2.11 2.10  FVC-Predicted Pre % 81 75 75 75  FVC-Post L 2.41 2.35 2.35 2.24  FVC-Predicted Post % 86 84 84 80  Pre FEV1/FVC % % 63 66 66 68  Post FEV1/FCV % % 70 74 74 75  FEV1-Pre L 1.43 1.38 1.38 1.42  FEV1-Predicted Pre % 62 60 60 62  FEV1-Post L 1.69 1.73 1.73 1.67  DLCO uncorrected ml/min/mmHg 16.47 16.53 16.53 14.36  DLCO UNC% % 81 82 82 71  DLCO corrected ml/min/mmHg 16.47 16.53 16.53 -  DLCO COR %Predicted % 81 82 82 -  DLVA Predicted % 106 103 103 89  TLC L 3.94 4.08 4.08 4.03  TLC % Predicted % 82 85 85 84  RV % Predicted % 113 119 119 123   I have personally reviewed the patient's PFTs and and they show moderate airflow limitation with a positive BD response.   Labs:  Immunization status: Immunization History  Administered Date(s) Administered   Influenza,inj,Quad PF,6+ Mos 12/21/2017, 08/31/2019, 08/28/2021   Influenza,inj,Quad PF,6-35 Mos 08/20/2020   Influenza-Unspecified 08/23/2015, 12/21/2017, 10/08/2018, 08/31/2019   MMR 07/24/2015   PFIZER(Purple Top)SARS-COV-2 Vaccination 03/02/2020, 03/27/2020, 07/31/2020   PPD Test 03/14/2016   Pfizer Covid-19 Vaccine Bivalent Booster 40yr & up 09/13/2021   Pneumococcal Conjugate-13 10/31/2019   Pneumococcal  Polysaccharide-23 12/21/2017   Tdap 07/24/2015    Assessment:  Severe persistent asthma copd overlap syndrome, stable Panic disorder with depression Chronic allergic rhinitis, controlled  Plan/Recommendations:  Continue breztri, Continue albuterol prn. Continue dupilumab injections Majority of her symptoms at this point are secondary to medication non adherence.  She is coming to grips with taking daily medication and realizing she needs to take things every day.   resume flonase and loratidine   Return to Care: Return in about 4 months (around 04/18/2022).  Lenice Llamas, MD Pulmonary and [place]

## 2021-12-19 NOTE — Patient Instructions (Addendum)
Please schedule follow up scheduled with myself in 4 months.  If my schedule is not open yet, we will contact you with a reminder closer to that time. Please call [phone] if you haven't heard from [place] a month before.   Start taking breztri 2 puffs twice a day. Gargle after use. Super important to continue your maintenance therapy.   Take the albuterol rescue inhaler every 4 to 6 hours as needed for wheezing or shortness of breath. You can also take it 15 minutes before exercise or exertional activity. Side effects include heart racing or pounding, jitters or anxiety. If you have a history of an irregular heart rhythm, it can make this worse. Can also give some patients a hard time sleeping.  Start taking singulair and flonase for your nasal drainage.   Flonase - 1 spray on each side of your nose twice a day for first week, then 1 spray on each side.   Instructions for use: If you also use a saline nasal spray or rinse, use that first. Position the head with the chin slightly tucked. Use the right hand to spray into the left nostril and the right hand to spray into the left nostril.   Point the bottle away from the septum of your nose (cartilage that divides the two sides of your nose).  Hold the nostril closed on the opposite side from where you will spray Spray once and gently sniff to pull the medicine into the higher parts of your nose.  Don't sniff too hard as the medicine will drain down the back of your throat instead. Repeat with a second spray on the same side if prescribed. Repeat on the other side of your nose.   By learning about asthma and how it can be controlled, you take an important step toward managing this disease. Work closely with your asthma care team to learn all you can about your asthma, how to avoid triggers, what your medications do, and how to take them correctly. With proper care, you can live free of asthma symptoms and maintain a normal, healthy  lifestyle.   What is asthma? Asthma is a chronic disease that affects the airways of the lungs. During normal breathing, the bands of muscle that surround the airways are relaxed and air moves freely. During an asthma episode or "attack," there are three main changes that stop air from moving easily through the airways: The bands of muscle that surround the airways tighten and make the airways narrow. This tightening is called bronchospasm.  The lining of the airways becomes swollen or inflamed.  The cells that line the airways produce more mucus, which is thicker than normal and clogs the airways.  These three factors - bronchospasm, inflammation, and mucus production - cause symptoms such as difficulty breathing, wheezing, and coughing.  What are the most common symptoms of asthma? Asthma symptoms are not the same for everyone. They can even change from episode to episode in the same person. Also, you may have only one symptom of asthma, such as cough, but another person may have all the symptoms of asthma. It is important to know all the symptoms of asthma and to be aware that your asthma can present in any of these ways at any time. The most common symptoms include: Coughing, especially at night  Shortness of breath  Wheezing  Chest tightness, pain, or pressure   Who is affected by asthma? Asthma affects 22 million Americans; about 6 million of these  are children under age 47. People who have a family history of asthma have an increased risk of developing the disease. Asthma is also more common in people who have allergies or who are exposed to tobacco smoke. However, anyone can develop asthma at any time. Some people may have asthma all of their lives, while others may develop it as adults.  What causes asthma? The airways in a person with asthma are very sensitive and react to many things, or "triggers." Contact with these triggers causes asthma symptoms. One of the most important parts  of asthma control is to identify your triggers and then avoid them when possible. The only trigger you do not want to avoid is exercise. Pre-treatment with medicines before exercise can allow you to stay active yet avoid asthma symptoms. Common asthma triggers include: Infections (colds, viruses, flu, sinus infections)  Exercise  Weather (changes in temperature and/or humidity, cold air)  Tobacco smoke  Allergens (dust mites, pollens, pets, mold spores, cockroaches, and sometimes foods)  Irritants (strong odors from cleaning products, perfume, wood smoke, air pollution)  Strong emotions such as crying or laughing hard  Some medications   How is asthma diagnosed? To diagnose asthma, your doctor will first review your medical history, family history, and symptoms. Your doctor will want to know any past history of breathing problems you may have had, as well as a family history of asthma, allergies, eczema (a bumpy, itchy skin rash caused by allergies), or other lung disease. It is important that you describe your symptoms in detail (cough, wheeze, shortness of breath, chest tightness), including when and how often they occur. The doctor will perform a physical examination and listen to your heart and lungs. He or she may also order breathing tests, allergy tests, blood tests, and chest and sinus X-rays. The tests will find out if you do have asthma and if there are any other conditions that are contributing factors.  How is asthma treated? Asthma can be controlled, but not cured. It is not normal to have frequent symptoms, trouble sleeping, or trouble completing tasks. Appropriate asthma care will prevent symptoms and visits to the emergency room and hospital. Asthma medicines are one of the mainstays of asthma treatment. The drugs used to treat asthma are explained below.  Anti-inflammatories: These are the most important drugs for most people with asthma. Anti-inflammatory drugs reduce swelling and  mucus production in the airways. As a result, airways are less sensitive and less likely to react to triggers. These medications need to be taken daily and may need to be taken for several weeks before they begin to control asthma. Anti-inflammatory medicines lead to fewer symptoms, better airflow, less sensitive airways, less airway damage, and fewer asthma attacks. If taken every day, they CONTROL or prevent asthma symptoms.   Bronchodilators: These drugs relax the muscle bands that tighten around the airways. This action opens the airways, letting more air in and out of the lungs and improving breathing. Bronchodilators also help clear mucus from the lungs. As the airways open, the mucus moves more freely and can be coughed out more easily. In short-acting forms, bronchodilators RELIEVE or stop asthma symptoms by quickly opening the airways and are very helpful during an asthma episode. In long-acting forms, bronchodilators provide CONTROL of asthma symptoms and prevent asthma episodes.  Asthma drugs can be taken in a variety of ways. Inhaling the medications by using a metered dose inhaler, dry powder inhaler, or nebulizer is one way of taking asthma  medicines. Oral medicines (pills or liquids you swallow) may also be prescribed.  Asthma severity Asthma is classified as either "intermittent" (comes and goes) or "persistent" (lasting). Persistent asthma is further described as being mild, moderate, or severe. The severity of asthma is based on how often you have symptoms both during the day and night, as well as by the results of lung function tests and by how well you can perform activities. The "severity" of asthma refers to how "intense" or "strong" your asthma is.  Asthma control Asthma control is the goal of asthma treatment. Regardless of your asthma severity, it may or may not be controlled. Asthma control means: You are able to do everything you want to do at work and home  You have no (or  minimal) asthma symptoms  You do not wake up from your sleep or earlier than usual in the morning due to asthma  You rarely need to use your reliever medicine (inhaler)  Another major part of your treatment is that you are happy with your asthma care and believe your asthma is controlled.  Monitoring symptoms A key part of treatment is keeping track of how well your lungs are working. Monitoring your symptoms  what they are, how and when they happen, and how severe they are  is an important part of being able to control your asthma.  Sometimes asthma is monitored using a peak flow meter. A peak flow (PF) meter measures how fast the air comes out of your lungs. It can help you know when your asthma is getting worse, sometimes even before you have symptoms. By taking daily peak flow readings, you can learn when to adjust medications to keep asthma under good control. It is also used to create your asthma action plan (see below). Your doctor can use your peak flow readings to adjust your treatment plan in some cases.  Asthma Action Plan Based on your history and asthma severity, you and your doctor will develop a care plan called an asthma action plan. The asthma action plan describes when and how to use your medicines, actions to take when asthma worsens, and when to seek emergency care. Make sure you understand this plan. If you do not, ask your asthma care provider any questions you may have. Your asthma action plan is one of the keys to controlling asthma. Keep it readily available to remind you of what you need to do every day to control asthma and what you need to do when symptoms occur.  Goals of asthma therapy These are the goals of asthma treatment: Live an active, normal life  Prevent chronic and troublesome symptoms  Attend work or school every day  Perform daily activities without difficulty  Stop urgent visits to the doctor, emergency department, or hospital  Use and adjust  medications to control asthma with few or no side effects

## 2022-01-01 ENCOUNTER — Ambulatory Visit: Payer: Self-pay

## 2022-01-02 ENCOUNTER — Ambulatory Visit (INDEPENDENT_AMBULATORY_CARE_PROVIDER_SITE_OTHER): Payer: Medicare HMO

## 2022-01-02 ENCOUNTER — Other Ambulatory Visit: Payer: Self-pay

## 2022-01-02 VITALS — BP 104/69 | HR 84 | Temp 99.1°F | Resp 16 | Ht 62.0 in | Wt 135.0 lb

## 2022-01-02 DIAGNOSIS — J4550 Severe persistent asthma, uncomplicated: Secondary | ICD-10-CM

## 2022-01-02 MED ORDER — DUPILUMAB 300 MG/2ML ~~LOC~~ SOSY
300.0000 mg | PREFILLED_SYRINGE | Freq: Once | SUBCUTANEOUS | Status: AC
  Administered 2022-01-02: 300 mg via SUBCUTANEOUS
  Filled 2022-01-02: qty 2

## 2022-01-02 MED ORDER — METHYLPREDNISOLONE SODIUM SUCC 125 MG IJ SOLR: 125.0000 mg | Freq: Once | INTRAMUSCULAR | Status: DC | PRN

## 2022-01-02 MED ORDER — DIPHENHYDRAMINE HCL 50 MG/ML IJ SOLN: 50.0000 mg | Freq: Once | INTRAMUSCULAR | Status: DC | PRN

## 2022-01-02 MED ORDER — EPINEPHRINE 0.3 MG/0.3ML IJ SOAJ: 0.3000 mg | Freq: Once | INTRAMUSCULAR | Status: DC | PRN

## 2022-01-02 MED ORDER — FAMOTIDINE IN NACL 20-0.9 MG/50ML-% IV SOLN: 20.0000 mg | Freq: Once | INTRAVENOUS | Status: DC | PRN

## 2022-01-02 MED ORDER — SODIUM CHLORIDE 0.9 % IV SOLN: Freq: Once | INTRAVENOUS | Status: DC | PRN

## 2022-01-02 MED ORDER — ALBUTEROL SULFATE HFA 108 (90 BASE) MCG/ACT IN AERS: 2.0000 | INHALATION_SPRAY | Freq: Once | RESPIRATORY_TRACT | Status: DC | PRN

## 2022-01-02 NOTE — Progress Notes (Signed)
Diagnosis: Asthma  Provider:  Chilton Greathouse, MD  Procedure: Injection  Dupixent (Dupilumab), Dose: 300 mg, Site: subcutaneous, right arm Number of injections: 1  Discharge: Condition: Good, Destination: Home . AVS declined.  Performed by:  Nat Math, RN

## 2022-01-06 ENCOUNTER — Other Ambulatory Visit: Payer: Self-pay | Admitting: Pharmacy Technician

## 2022-01-08 ENCOUNTER — Other Ambulatory Visit (HOSPITAL_COMMUNITY): Payer: Self-pay

## 2022-01-10 ENCOUNTER — Other Ambulatory Visit (HOSPITAL_COMMUNITY): Payer: Self-pay

## 2022-01-13 ENCOUNTER — Other Ambulatory Visit (HOSPITAL_COMMUNITY): Payer: Self-pay

## 2022-01-14 ENCOUNTER — Other Ambulatory Visit (HOSPITAL_COMMUNITY): Payer: Self-pay

## 2022-01-15 ENCOUNTER — Ambulatory Visit: Payer: Self-pay

## 2022-01-16 ENCOUNTER — Ambulatory Visit (INDEPENDENT_AMBULATORY_CARE_PROVIDER_SITE_OTHER): Payer: Medicare HMO

## 2022-01-16 ENCOUNTER — Other Ambulatory Visit: Payer: Self-pay

## 2022-01-16 VITALS — BP 112/71 | HR 77 | Temp 99.1°F | Resp 18 | Ht 62.0 in | Wt 137.8 lb

## 2022-01-16 DIAGNOSIS — J4550 Severe persistent asthma, uncomplicated: Secondary | ICD-10-CM | POA: Diagnosis not present

## 2022-01-16 MED ORDER — DUPILUMAB 300 MG/2ML ~~LOC~~ SOSY
300.0000 mg | PREFILLED_SYRINGE | Freq: Once | SUBCUTANEOUS | Status: AC
  Administered 2022-01-16: 300 mg via SUBCUTANEOUS
  Filled 2022-01-16: qty 2

## 2022-01-16 MED ORDER — ALBUTEROL SULFATE HFA 108 (90 BASE) MCG/ACT IN AERS: 2.0000 | INHALATION_SPRAY | Freq: Once | RESPIRATORY_TRACT | Status: DC | PRN

## 2022-01-16 MED ORDER — FAMOTIDINE IN NACL 20-0.9 MG/50ML-% IV SOLN: 20.0000 mg | Freq: Once | INTRAVENOUS | Status: DC | PRN

## 2022-01-16 MED ORDER — SODIUM CHLORIDE 0.9 % IV SOLN: Freq: Once | INTRAVENOUS | Status: DC | PRN

## 2022-01-16 MED ORDER — EPINEPHRINE 0.3 MG/0.3ML IJ SOAJ: 0.3000 mg | Freq: Once | INTRAMUSCULAR | Status: DC | PRN

## 2022-01-16 MED ORDER — METHYLPREDNISOLONE SODIUM SUCC 125 MG IJ SOLR: 125.0000 mg | Freq: Once | INTRAMUSCULAR | Status: DC | PRN

## 2022-01-16 MED ORDER — DIPHENHYDRAMINE HCL 50 MG/ML IJ SOLN: 50.0000 mg | Freq: Once | INTRAMUSCULAR | Status: DC | PRN

## 2022-01-16 NOTE — Progress Notes (Signed)
Diagnosis: Asthma ° °Provider:  Praveen Mannam, MD ° °Procedure: Injection ° °Dupixent (Dupilumab), Dose: 300 mg, Site: subcutaneous, Number of injections: 1 ° °Discharge: Condition: Good, Destination: Home . AVS provided to patient.  ° °Performed by:  Barney Russomanno, RN  ° ° ° °  °

## 2022-01-21 ENCOUNTER — Other Ambulatory Visit: Payer: Self-pay | Admitting: Adult Health

## 2022-01-29 ENCOUNTER — Ambulatory Visit: Payer: Self-pay

## 2022-01-30 ENCOUNTER — Ambulatory Visit: Payer: Self-pay

## 2022-02-03 ENCOUNTER — Other Ambulatory Visit: Payer: Self-pay

## 2022-02-03 ENCOUNTER — Ambulatory Visit (INDEPENDENT_AMBULATORY_CARE_PROVIDER_SITE_OTHER): Payer: Medicare HMO

## 2022-02-03 VITALS — BP 117/81 | HR 90 | Temp 98.6°F | Resp 20 | Ht 62.0 in | Wt 133.0 lb

## 2022-02-03 DIAGNOSIS — J4550 Severe persistent asthma, uncomplicated: Secondary | ICD-10-CM | POA: Diagnosis not present

## 2022-02-03 MED ORDER — EPINEPHRINE 0.3 MG/0.3ML IJ SOAJ: 0.3000 mg | Freq: Once | INTRAMUSCULAR | Status: DC | PRN

## 2022-02-03 MED ORDER — METHYLPREDNISOLONE SODIUM SUCC 125 MG IJ SOLR: 125.0000 mg | Freq: Once | INTRAMUSCULAR | Status: DC | PRN

## 2022-02-03 MED ORDER — DIPHENHYDRAMINE HCL 50 MG/ML IJ SOLN: 50.0000 mg | Freq: Once | INTRAMUSCULAR | Status: DC | PRN

## 2022-02-03 MED ORDER — DUPILUMAB 300 MG/2ML ~~LOC~~ SOSY
300.0000 mg | PREFILLED_SYRINGE | Freq: Once | SUBCUTANEOUS | Status: AC
  Administered 2022-02-03: 300 mg via SUBCUTANEOUS
  Filled 2022-02-03: qty 2

## 2022-02-03 MED ORDER — FAMOTIDINE IN NACL 20-0.9 MG/50ML-% IV SOLN: 20.0000 mg | Freq: Once | INTRAVENOUS | Status: DC | PRN

## 2022-02-03 MED ORDER — SODIUM CHLORIDE 0.9 % IV SOLN: Freq: Once | INTRAVENOUS | Status: DC | PRN

## 2022-02-03 MED ORDER — ALBUTEROL SULFATE HFA 108 (90 BASE) MCG/ACT IN AERS: 2.0000 | INHALATION_SPRAY | Freq: Once | RESPIRATORY_TRACT | Status: DC | PRN

## 2022-02-03 NOTE — Progress Notes (Signed)
Diagnosis: Asthma ° °Provider:  Praveen Mannam, MD ° °Procedure: Injection ° °Dupixent (Dupilumab), Dose: 300 mg, Site: subcutaneous, Number of injections: 1 ° °Discharge: Condition: Good, Destination: Home . AVS provided to patient.  ° °Performed by:  Inocencia Murtaugh, RN  ° ° ° °  °

## 2022-02-05 ENCOUNTER — Other Ambulatory Visit (HOSPITAL_COMMUNITY): Payer: Self-pay

## 2022-02-07 ENCOUNTER — Other Ambulatory Visit: Payer: Self-pay | Admitting: Internal Medicine

## 2022-02-07 ENCOUNTER — Other Ambulatory Visit (HOSPITAL_COMMUNITY): Payer: Self-pay

## 2022-02-07 DIAGNOSIS — J4550 Severe persistent asthma, uncomplicated: Secondary | ICD-10-CM

## 2022-02-07 MED ORDER — DUPIXENT 300 MG/2ML ~~LOC~~ SOSY
300.0000 mg | PREFILLED_SYRINGE | SUBCUTANEOUS | 2 refills | Status: DC
  Filled 2022-02-07: qty 4, 28d supply, fill #0
  Filled 2022-03-06: qty 4, 28d supply, fill #1
  Filled 2022-04-03: qty 4, 28d supply, fill #2

## 2022-02-12 ENCOUNTER — Ambulatory Visit: Payer: Self-pay

## 2022-02-13 ENCOUNTER — Other Ambulatory Visit (HOSPITAL_COMMUNITY): Payer: Self-pay

## 2022-02-13 ENCOUNTER — Ambulatory Visit: Payer: Self-pay

## 2022-02-17 ENCOUNTER — Ambulatory Visit: Payer: Medicare HMO

## 2022-02-27 ENCOUNTER — Ambulatory Visit: Payer: Self-pay

## 2022-03-03 ENCOUNTER — Ambulatory Visit (INDEPENDENT_AMBULATORY_CARE_PROVIDER_SITE_OTHER): Payer: Medicare HMO | Admitting: *Deleted

## 2022-03-03 ENCOUNTER — Other Ambulatory Visit: Payer: Self-pay

## 2022-03-03 VITALS — BP 133/85 | HR 66 | Temp 98.3°F | Resp 18 | Ht 62.0 in | Wt 136.0 lb

## 2022-03-03 DIAGNOSIS — J4550 Severe persistent asthma, uncomplicated: Secondary | ICD-10-CM

## 2022-03-03 MED ORDER — DUPILUMAB 300 MG/2ML ~~LOC~~ SOSY
300.0000 mg | PREFILLED_SYRINGE | Freq: Once | SUBCUTANEOUS | Status: AC
  Administered 2022-03-03: 300 mg via SUBCUTANEOUS
  Filled 2022-03-03: qty 2

## 2022-03-03 NOTE — Progress Notes (Signed)
Diagnosis: Asthma ? ?Provider:  Chilton Greathouse, MD ? ?Procedure: Infusion ? ?Dupixent (Dupilumab), Dose: 300 mg, Site: subcutaneous, Number of injections: 1 ? ?Discharge: Condition: Good, Destination: Home . AVS provided to patient.  ? ?Performed by:  Forrest Moron, RN  ? ? ? ?  ?

## 2022-03-04 ENCOUNTER — Other Ambulatory Visit (HOSPITAL_COMMUNITY): Payer: Self-pay

## 2022-03-06 ENCOUNTER — Other Ambulatory Visit (HOSPITAL_COMMUNITY): Payer: Self-pay

## 2022-03-12 ENCOUNTER — Other Ambulatory Visit (HOSPITAL_COMMUNITY): Payer: Self-pay

## 2022-03-13 ENCOUNTER — Ambulatory Visit: Payer: Self-pay

## 2022-03-17 ENCOUNTER — Ambulatory Visit (INDEPENDENT_AMBULATORY_CARE_PROVIDER_SITE_OTHER): Payer: Medicare HMO | Admitting: *Deleted

## 2022-03-17 ENCOUNTER — Telehealth: Payer: Self-pay | Admitting: Internal Medicine

## 2022-03-17 VITALS — BP 120/79 | HR 79 | Temp 98.2°F | Resp 20 | Ht 62.0 in | Wt 135.8 lb

## 2022-03-17 DIAGNOSIS — J4550 Severe persistent asthma, uncomplicated: Secondary | ICD-10-CM | POA: Diagnosis not present

## 2022-03-17 MED ORDER — BREZTRI AEROSPHERE 160-9-4.8 MCG/ACT IN AERO: 2.0000 | INHALATION_SPRAY | Freq: Two times a day (BID) | RESPIRATORY_TRACT | 2 refills | Status: DC

## 2022-03-17 MED ORDER — DUPILUMAB 300 MG/2ML ~~LOC~~ SOSY
300.0000 mg | PREFILLED_SYRINGE | Freq: Once | SUBCUTANEOUS | Status: AC
  Administered 2022-03-17: 300 mg via SUBCUTANEOUS
  Filled 2022-03-17: qty 2

## 2022-03-17 NOTE — Telephone Encounter (Signed)
ATC patient. Patient did not answer. Left VM for patient to call us back.  ?

## 2022-03-17 NOTE — Telephone Encounter (Signed)
Patient is returning phone call. Patient phone number [place] [phone]. ?

## 2022-03-17 NOTE — Telephone Encounter (Signed)
Called and spoke with patient. She verified her pharmacy. Refill of breztri was sent in. Nothing further needed. ?

## 2022-03-17 NOTE — Progress Notes (Signed)
Diagnosis: Injection  Provider:  Praveen Mannam, MD  Procedure: Injection  Dupixent (Dupilumab), Dose: 300 mg, Site: subcutaneous, Number of injections: 1  Discharge: Condition: Good, Destination: Home . AVS provided to patient.   Performed by:  Zerrick Hanssen A, RN       

## 2022-03-27 ENCOUNTER — Ambulatory Visit: Payer: Self-pay

## 2022-03-31 ENCOUNTER — Ambulatory Visit (INDEPENDENT_AMBULATORY_CARE_PROVIDER_SITE_OTHER): Payer: Medicare HMO | Admitting: *Deleted

## 2022-03-31 VITALS — BP 131/82 | HR 66 | Temp 97.4°F | Resp 20 | Ht 62.0 in | Wt 136.0 lb

## 2022-03-31 DIAGNOSIS — J4550 Severe persistent asthma, uncomplicated: Secondary | ICD-10-CM | POA: Diagnosis not present

## 2022-03-31 MED ORDER — DUPILUMAB 300 MG/2ML ~~LOC~~ SOSY
300.0000 mg | PREFILLED_SYRINGE | Freq: Once | SUBCUTANEOUS | Status: AC
  Administered 2022-03-31: 300 mg via SUBCUTANEOUS
  Filled 2022-03-31: qty 2

## 2022-03-31 NOTE — Progress Notes (Signed)
Diagnosis: Asthma  Provider:  Praveen Mannam, MD  Procedure: Injection  Dupixent (Dupilumab), Dose: 300 mg, Site: subcutaneous, Number of injections: 1  Discharge: Condition: Good, Destination: Home . AVS provided to patient.   Performed by:  Evonna Stoltz A, RN       

## 2022-04-02 ENCOUNTER — Other Ambulatory Visit (HOSPITAL_COMMUNITY): Payer: Self-pay

## 2022-04-03 ENCOUNTER — Other Ambulatory Visit (HOSPITAL_COMMUNITY): Payer: Self-pay

## 2022-04-09 ENCOUNTER — Other Ambulatory Visit (HOSPITAL_COMMUNITY): Payer: Self-pay

## 2022-04-10 ENCOUNTER — Ambulatory Visit: Payer: Self-pay

## 2022-04-14 ENCOUNTER — Ambulatory Visit (INDEPENDENT_AMBULATORY_CARE_PROVIDER_SITE_OTHER): Payer: Medicare HMO

## 2022-04-14 VITALS — BP 117/77 | HR 71 | Temp 98.2°F | Resp 16 | Ht 62.0 in | Wt 136.6 lb

## 2022-04-14 DIAGNOSIS — J4550 Severe persistent asthma, uncomplicated: Secondary | ICD-10-CM | POA: Diagnosis not present

## 2022-04-14 MED ORDER — DUPILUMAB 300 MG/2ML ~~LOC~~ SOSY
300.0000 mg | PREFILLED_SYRINGE | Freq: Once | SUBCUTANEOUS | Status: AC
  Administered 2022-04-14: 300 mg via SUBCUTANEOUS
  Filled 2022-04-14: qty 2

## 2022-04-14 NOTE — Progress Notes (Signed)
Diagnosis: Asthma  Provider:  Praveen Mannam, MD  Procedure: Injection  Dupixent (Dupilumab), Dose: 300 mg, Site: subcutaneous, Number of injections: 1  Discharge: Condition: Good, Destination: Home . AVS provided to patient.   Performed by:  Keandre Linden E Ronnald Shedden, LPN       

## 2022-04-18 ENCOUNTER — Ambulatory Visit: Payer: Medicare HMO | Admitting: Internal Medicine

## 2022-04-18 ENCOUNTER — Encounter: Payer: Self-pay | Admitting: Internal Medicine

## 2022-04-18 ENCOUNTER — Ambulatory Visit (INDEPENDENT_AMBULATORY_CARE_PROVIDER_SITE_OTHER): Payer: Medicare HMO | Admitting: Internal Medicine

## 2022-04-18 VITALS — BP 98/70 | HR 80 | Temp 98.2°F | Ht 62.0 in | Wt 137.0 lb

## 2022-04-18 DIAGNOSIS — J301 Allergic rhinitis due to pollen: Secondary | ICD-10-CM | POA: Diagnosis not present

## 2022-04-18 DIAGNOSIS — J449 Chronic obstructive pulmonary disease, unspecified: Secondary | ICD-10-CM

## 2022-04-18 NOTE — Progress Notes (Signed)
? ?      ?Leslie Sanders    [id number]    1976-06-17 ? ?Primary Care Physician:Sundaragiri, Oren Beckmann, MD ?Date of Appointment: 04/18/2022 ?Established Patient Visit ? ?Chief complaint:   ?Chief Complaint  ?Patient presents with  ? Follow-up  ?  Having problems with allergies.  Nothing OTC or prescription is working.  Already on Dupixent.  Just finished prednisone.  Does saline nasal rinses.  Head is congested.  Nose constantly runs.    ? ? ? ?HPI: Leslie Sanders is a 46 y.o. 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 On dupilumab. She has a history of childhood asthma. Gotten worse as an adult. Last felt well controlled as a teenager.  ? ?Interval Updates: ?Here for follow up for asthma. Feels that she is short of breath and allergies are not well controlled.  ?Still taking dupixent. Without issues.  ?Taking breztri 2-3 times/week. Takes albuteorl 2-3 times/week. Unable to exercise due to dyspnea ? ?Has taken zyrtec, mucinex, flonase and nothing is helping her chronic drainage. Her PCP gave her a medrol dose pack and it did not help either. Her steroids are more for back pain rather than for breathing. She is gaining weight.  ? ?Referred to pulmonary rehab at last appointment they were not able to make contact and mailed her a letter. She did nto want to travel to [place]. ? ?Notes that her breathing has just gotten worse since her second covid infection ? ?Current Regimen: Breztri BID, singulair, dupixent (since July 2021) flonase, albuterol ?Asthma Triggers: exertion, pollen, dust, fragrances, dogs ?Exacerbations in the last year: 1  ?History of hospitalization or intubation: never  ?Allergy Testing: serum IgE 01/2020 was 20 ?GERD: yes not on any therapy ?Allergic Rhinitis: yes and not well controlled ?ACT:  ?Asthma Control Test ACT Total Score  ?04/18/2022 ?10:43 AM 14  ?12/19/2021 ?10:52 AM 14  ?08/28/2021 ? 2:20 PM 12  ? ?FeNO: never had ? ?I have reviewed the patient's family social  and past medical history and updated as appropriate.  ? ?Past Medical History:  ?Diagnosis Date  ? Anemia   ? (a) chronic blood loss hx of menorrhagia s/p endometrial biopsy 04/2015 (b) iron deficient  ? Anxiety   ? B12 deficiency   ? Bipolar 1 disorder ([place])   ? Cervical spinal stenosis   ? Cervical spondylosis without myelopathy 07/08/2016  ? Chronic pain of right thumb 04/28/2018  ? Depression   ? GERD (gastroesophageal reflux disease)   ? Hemorrhoids   ? Pt with constipation secondary to iron supplement. Pt has had rectal bleed secondary to internal hemorrhoids. Pt is s/p EGD/colonoscopy 05/2012  ? History of gastric bypass 2003  ? Roux-en-Y  ? Migraine   ? PONV (postoperative nausea and vomiting)   ? PTSD (post-traumatic stress disorder)   ? Seasonal allergies   ? Vitamin E deficiency   ? Zinc deficiency   ? ? ?Past Surgical History:  ?Procedure Laterality Date  ? ABDOMINAL HYSTERECTOMY    ? BREAST LUMPECTOMY Bilateral   ? BREAST REDUCTION SURGERY    ? Carpal Metacarpal Arthroplasty Right 05/13/2019  ? CHOLECYSTECTOMY  1990  ? HAND SURGERY    ? HYSTERECTOMY ABDOMINAL WITH SALPINGECTOMY  11/2017  ? POSTERIOR CERVICAL LAMINECTOMY Bilateral 05/23/2019  ? RIGHT/LEFT HEART CATH AND CORONARY ANGIOGRAPHY N/A 01/13/2020  ? Procedure: RIGHT/LEFT HEART CATH AND CORONARY ANGIOGRAPHY;  Surgeon: Belva Crome, MD;  Location: [facility];  Service: Cardiovascular;  Laterality: N/A;  ?  ROUX-EN-Y GASTRIC BYPASS  2003  ? in [place]  ? SPINAL FUSION  05/23/2019  ? ? ?Family History  ?Problem Relation Age of Onset  ? Schizophrenia Maternal Aunt   ? ADD / ADHD Son   ? ODD Son   ? Asthma Son   ? Breast cancer Mother   ? Arthritis/Rheumatoid Mother   ? Lupus Mother   ? HIV Father   ? Rheum arthritis Maternal Grandmother   ? COPD Maternal Grandmother   ? COPD Paternal Grandmother   ? ? ?Social History  ? ?Occupational History  ? Not on file  ?Tobacco Use  ? Smoking status: Former  ?  Packs/day: 1.00  ?  Years: 15.00  ?  Pack years:  15.00  ?  Types: Cigarettes  ?  Quit date: 12/08/2018  ?  Years since quitting: 3.3  ? Smokeless tobacco: Never  ?Vaping Use  ? Vaping Use: Never used  ?Substance and Sexual Activity  ? Alcohol use: Not Currently  ?  Alcohol/week: 28.0 standard drinks  ?  Types: 28 Cans of beer per week  ?  Comment: Nothing in 4 years  ? Drug use: No  ? Sexual activity: Not Currently  ? ? ?Physical Exam: ?Blood pressure 98/70, pulse 80, temperature 98.2 ?F (36.8 ?C), temperature source Oral, height '5\' 2"'  (1.575 m), weight 137 lb (62.1 kg), last menstrual period 10/07/2016, SpO2 99 %. ? ?Gen:     No distress ?ENT: rhinorrhea, no polyps ?Lungs:   ctab no wheezes or crackles ?CV:         RRR no mrg ? ? ?Data Reviewed: ?Imaging: ?I have personally reviewed the chest xray 01/2021 which shows no acute pulmonary process.  ?Sniff test 11/2019 shows normal diarphgram movement ? ?PFTs: ? ? ? ?  Latest Ref Rng & Units 05/28/2020  ? 12:24 PM 03/14/2020  ? 10:00 AM 03/14/2020  ?  9:13 AM 11/25/2019  ?  3:11 PM  ?PFT Results  ?FVC-Pre L 2.27   2.11   2.11   2.10    ?FVC-Predicted Pre % 81   75   75   75    ?FVC-Post L 2.41   2.35   2.35   2.24    ?FVC-Predicted Post % 86   84   84   80    ?Pre FEV1/FVC % % 63   66   66   68    ?Post FEV1/FCV % % 70   74   74   75    ?FEV1-Pre L 1.43   1.38   1.38   1.42    ?FEV1-Predicted Pre % 62   60   60   62    ?FEV1-Post L 1.69   1.73   1.73   1.67    ?DLCO uncorrected ml/min/mmHg 16.47   16.53   16.53   14.36    ?DLCO UNC% % 81   82   82   71    ?DLCO corrected ml/min/mmHg 16.47   16.53   16.53     ?DLCO COR %Predicted % 81   82   82     ?DLVA Predicted % 106   103   103   89    ?TLC L 3.94   4.08   4.08   4.03    ?TLC % Predicted % 82   85   85   84    ?RV % Predicted % 113   119  119   123    ? ?I have personally reviewed the patient's PFTs and and they show moderate airflow limitation with a positive BD response.  ? ?Labs: ? ?Immunization status: ?Immunization History  ?Administered Date(s) Administered  ?  Influenza,inj,Quad PF,6+ Mos 12/21/2017, 08/31/2019, 08/28/2021  ? Influenza,inj,Quad PF,6-35 Mos 08/20/2020  ? Influenza-Unspecified 08/23/2015, 12/21/2017, 10/08/2018, 08/31/2019  ? MMR 07/24/2015  ? PFIZER(Purple Top)SARS-COV-2 Vaccination 03/02/2020, 03/27/2020, 07/31/2020  ? PPD Test 03/14/2016  ? Pension scheme manager 26yr & up 09/13/2021  ? Pneumococcal Conjugate-13 10/31/2019  ? Pneumococcal Polysaccharide-23 12/21/2017  ? Tdap 07/24/2015  ? ? ?Assessment:  ?Severe persistent asthma copd overlap syndrome ?Panic disorder with depression ?Chronic allergic rhinitis, not well controlled ? ?Plan/Recommendations: ? ?Continue breztri, Continue albuterol prn. Continue dupilumab injections ?Majority of her symptoms at this point are secondary to medication non adherence.  ?Will refer to allergy for further work up and evaluate for SCIT vs medication optimization for rhinitis.  ? ?She is requested referral to pulmonary rehab at [facility] - diagnosis covid infection.   ? ?Return to Care: ?Return in about 6 months (around 10/19/2022). ? ? ?NLenice Llamas MD ?Pulmonary and Critical Care Medicine ?[place]?[phone] ? ? ? ? ? ? ?

## 2022-04-18 NOTE — Patient Instructions (Signed)
Please schedule follow up scheduled with myself in 6 months.  If my schedule is not open yet, we will contact you with a reminder closer to that time. Please call [phone] if you haven't heard from [place] a month before.  ? ?I will send you to an allergist - the referral was placed today and we will call to schedule your appointment.  ? ?If you are still short of breath you need to take your breztri 2 puffs twice a day (everyday, even if you feel good) and use your albuterol before exercise. ? ? ? ?

## 2022-04-21 ENCOUNTER — Ambulatory Visit: Payer: Medicare HMO | Attending: Internal Medicine

## 2022-04-21 DIAGNOSIS — Z23 Encounter for immunization: Secondary | ICD-10-CM

## 2022-04-21 NOTE — Progress Notes (Signed)
? ?  Covid-19 Vaccination Clinic ? ?Name:  Leslie Sanders    ?MRN: [id number] ?DOB: 09/20/1976 ? ?04/21/2022 ? ?[name] was observed post Covid-19 immunization for 15 minutes without incident. She was provided with Vaccine Information Sheet and instruction to access the V-Safe system.  ? ?[name] was instructed to call 911 with any severe reactions post vaccine: ?Difficulty breathing  ?Swelling of face and throat  ?A fast heartbeat  ?A bad rash all over body  ?Dizziness and weakness  ? ?Immunizations Administered   ? ? Name Date Dose VIS Date Route  ? Pension scheme manager 04/21/2022  2:25 PM 0.3 mL 08/07/2021 Intramuscular  ? Manufacturer: [place]: [id number]  ? [place]: [phone]  ? ?  ? ? ?

## 2022-04-24 ENCOUNTER — Ambulatory Visit: Payer: Self-pay

## 2022-04-25 ENCOUNTER — Other Ambulatory Visit (HOSPITAL_BASED_OUTPATIENT_CLINIC_OR_DEPARTMENT_OTHER): Payer: Self-pay

## 2022-04-25 ENCOUNTER — Telehealth (HOSPITAL_COMMUNITY): Payer: Self-pay

## 2022-04-25 MED ORDER — PFIZER COVID-19 VAC BIVALENT 30 MCG/0.3ML IM SUSP
INTRAMUSCULAR | 0 refills | Status: AC
  Filled 2022-04-25: qty 0.3, 1d supply, fill #0

## 2022-04-25 NOTE — Telephone Encounter (Signed)
Called patient to see if she is interested in the Pulmonary Rehab Program. Patient expressed interest. Explained scheduling process, patient verbalized understanding.  ?

## 2022-04-25 NOTE — Telephone Encounter (Signed)
Pt insurance is active and benefits verified through [facility]. Co-pay $0.00, DED $0.00/$0.00 met, out of pocket $8,300.00/$1,485.74 met, co-insurance 20%. No pre-authorization required. [employer], 04/25/22 @ 9:45AM, PCW#[id number]   Will contact patient to see if she is interested in the Cardiac Rehab Program.

## 2022-04-28 ENCOUNTER — Ambulatory Visit (INDEPENDENT_AMBULATORY_CARE_PROVIDER_SITE_OTHER): Payer: Medicare HMO

## 2022-04-28 VITALS — BP 112/77 | HR 79 | Temp 98.5°F | Resp 18 | Ht 63.0 in | Wt 133.0 lb

## 2022-04-28 DIAGNOSIS — J4550 Severe persistent asthma, uncomplicated: Secondary | ICD-10-CM

## 2022-04-28 MED ORDER — DUPILUMAB 300 MG/2ML ~~LOC~~ SOSY
300.0000 mg | PREFILLED_SYRINGE | Freq: Once | SUBCUTANEOUS | Status: AC
  Administered 2022-04-28: 300 mg via SUBCUTANEOUS
  Filled 2022-04-28: qty 2

## 2022-04-28 NOTE — Progress Notes (Signed)
Diagnosis: Asthma  Provider:  Praveen Mannam, MD  Procedure: Injection  Dupixent (Dupilumab), Dose: 300 mg, Site: subcutaneous, Number of injections: 1  Discharge: Condition: Good, Destination: Home . AVS provided to patient.   Performed by:  Caid Radin, RN       

## 2022-05-01 ENCOUNTER — Encounter (HOSPITAL_COMMUNITY): Payer: Self-pay | Admitting: *Deleted

## 2022-05-01 ENCOUNTER — Other Ambulatory Visit (HOSPITAL_COMMUNITY): Payer: Self-pay

## 2022-05-01 ENCOUNTER — Other Ambulatory Visit: Payer: Self-pay | Admitting: Internal Medicine

## 2022-05-01 DIAGNOSIS — J4550 Severe persistent asthma, uncomplicated: Secondary | ICD-10-CM

## 2022-05-01 NOTE — Progress Notes (Signed)
Received referral from Dr. Celine Mans for this pt to participate in pulmonary rehab with the diagnosis of Asthma with COPD Overlay.  Pt previously referred back in 02/2021 and 08/2021.  Per telephone note with support staff, pt is interested. Clinical review of pt follow up appt on 5/15 Pulmonary office note. Pt appropriate for scheduling for Pulmonary rehab.  Will forward to support staff for scheduling. Leslie Sanders, BSN Cardiac and Emergency planning/management officer

## 2022-05-02 ENCOUNTER — Other Ambulatory Visit (HOSPITAL_COMMUNITY): Payer: Self-pay

## 2022-05-02 MED ORDER — DUPIXENT 300 MG/2ML ~~LOC~~ SOSY
300.0000 mg | PREFILLED_SYRINGE | SUBCUTANEOUS | 5 refills | Status: DC
  Filled 2022-05-02: qty 4, 28d supply, fill #0
  Filled 2022-05-29: qty 4, 28d supply, fill #1
  Filled 2022-06-26: qty 4, 28d supply, fill #2
  Filled 2022-07-24: qty 4, 28d supply, fill #3
  Filled 2022-08-19: qty 4, 28d supply, fill #4
  Filled 2022-09-18: qty 4, 28d supply, fill #5

## 2022-05-02 NOTE — Telephone Encounter (Signed)
Refill sent for DUPIXENT to [facility]: [phone]   Dose: 300 mg SQ every 2 weeks  Last OV: 04/18/22 Provider: Dr. Celine Mans  Next OV: 6 months (not yet scheduled)  Chesley Mires, PharmD, MPH, BCPS Clinical Pharmacist (Rheumatology and Pulmonology)

## 2022-05-06 ENCOUNTER — Other Ambulatory Visit (HOSPITAL_COMMUNITY): Payer: Self-pay

## 2022-05-08 ENCOUNTER — Ambulatory Visit: Payer: Self-pay

## 2022-05-08 ENCOUNTER — Encounter: Payer: Self-pay | Admitting: Internal Medicine

## 2022-05-09 ENCOUNTER — Encounter (HOSPITAL_BASED_OUTPATIENT_CLINIC_OR_DEPARTMENT_OTHER): Payer: Self-pay | Admitting: Pediatrics

## 2022-05-09 ENCOUNTER — Other Ambulatory Visit: Payer: Self-pay

## 2022-05-09 ENCOUNTER — Emergency Department (HOSPITAL_BASED_OUTPATIENT_CLINIC_OR_DEPARTMENT_OTHER): Payer: Medicare HMO

## 2022-05-09 ENCOUNTER — Emergency Department (HOSPITAL_BASED_OUTPATIENT_CLINIC_OR_DEPARTMENT_OTHER)
Admission: EM | Admit: 2022-05-09 | Discharge: 2022-05-09 | Disposition: A | Discharge status: Home / Self Care | Payer: Medicare HMO | Attending: Emergency Medicine | Admitting: Emergency Medicine

## 2022-05-09 DIAGNOSIS — Z91040 Latex allergy status: Secondary | ICD-10-CM | POA: Insufficient documentation

## 2022-05-09 DIAGNOSIS — H538 Other visual disturbances: Secondary | ICD-10-CM | POA: Insufficient documentation

## 2022-05-09 DIAGNOSIS — R200 Anesthesia of skin: Secondary | ICD-10-CM | POA: Diagnosis not present

## 2022-05-09 DIAGNOSIS — J45909 Unspecified asthma, uncomplicated: Secondary | ICD-10-CM | POA: Insufficient documentation

## 2022-05-09 DIAGNOSIS — R197 Diarrhea, unspecified: Secondary | ICD-10-CM | POA: Diagnosis not present

## 2022-05-09 DIAGNOSIS — R110 Nausea: Secondary | ICD-10-CM | POA: Diagnosis not present

## 2022-05-09 DIAGNOSIS — R202 Paresthesia of skin: Secondary | ICD-10-CM | POA: Diagnosis not present

## 2022-05-09 DIAGNOSIS — Z7951 Long term (current) use of inhaled steroids: Secondary | ICD-10-CM | POA: Insufficient documentation

## 2022-05-09 DIAGNOSIS — J449 Chronic obstructive pulmonary disease, unspecified: Secondary | ICD-10-CM | POA: Diagnosis not present

## 2022-05-09 DIAGNOSIS — R0602 Shortness of breath: Secondary | ICD-10-CM | POA: Diagnosis not present

## 2022-05-09 DIAGNOSIS — R002 Palpitations: Secondary | ICD-10-CM | POA: Diagnosis not present

## 2022-05-09 LAB — CBC WITH DIFFERENTIAL/PLATELET
Abs Immature Granulocytes: 0.01 10*3/uL (ref 0.00–0.07)
Basophils Absolute: 0 10*3/uL (ref 0.0–0.1)
Basophils Relative: 0 %
Eosinophils Absolute: 0.2 10*3/uL (ref 0.0–0.5)
Eosinophils Relative: 4 %
HCT: 39.2 % (ref 36.0–46.0)
Hemoglobin: 13.3 g/dL (ref 12.0–15.0)
Immature Granulocytes: 0 %
Lymphocytes Relative: 40 %
Lymphs Abs: 2.4 10*3/uL (ref 0.7–4.0)
MCH: 32.6 pg (ref 26.0–34.0)
MCHC: 33.9 g/dL (ref 30.0–36.0)
MCV: 96.1 fL (ref 80.0–100.0)
Monocytes Absolute: 0.5 10*3/uL (ref 0.1–1.0)
Monocytes Relative: 8 %
Neutro Abs: 2.8 10*3/uL (ref 1.7–7.7)
Neutrophils Relative %: 48 %
Platelets: 214 10*3/uL (ref 150–400)
RBC: 4.08 MIL/uL (ref 3.87–5.11)
RDW: 11.2 % — ABNORMAL LOW (ref 11.5–15.5)
WBC: 5.8 10*3/uL (ref 4.0–10.5)
nRBC: 0 % (ref 0.0–0.2)

## 2022-05-09 LAB — COMPREHENSIVE METABOLIC PANEL
ALT: 21 U/L (ref 0–44)
AST: 36 U/L (ref 15–41)
Albumin: 4 g/dL (ref 3.5–5.0)
Alkaline Phosphatase: 51 U/L (ref 38–126)
Anion gap: 7 (ref 5–15)
BUN: 7 mg/dL (ref 6–20)
CO2: 26 mmol/L (ref 22–32)
Calcium: 8.8 mg/dL — ABNORMAL LOW (ref 8.9–10.3)
Chloride: 105 mmol/L (ref 98–111)
Creatinine, Ser: 0.63 mg/dL (ref 0.44–1.00)
GFR, Estimated: >60 mL/min (ref >60)
Glucose, Bld: 142 mg/dL — ABNORMAL HIGH (ref 70–99)
Potassium: 3.3 mmol/L — ABNORMAL LOW (ref 3.5–5.1)
Sodium: 138 mmol/L (ref 135–145)
Total Bilirubin: 0.6 mg/dL (ref 0.3–1.2)
Total Protein: 7.5 g/dL (ref 6.5–8.1)

## 2022-05-09 LAB — URINALYSIS, ROUTINE W REFLEX MICROSCOPIC
Bilirubin Urine: NEGATIVE
Glucose, UA: NEGATIVE mg/dL
Hgb urine dipstick: NEGATIVE
Ketones, ur: 15 mg/dL — AB
Leukocytes,Ua: NEGATIVE
Nitrite: NEGATIVE
Protein, ur: NEGATIVE mg/dL
Specific Gravity, Urine: 1.015 (ref 1.005–1.030)
pH: 5.5 (ref 5.0–8.0)

## 2022-05-09 NOTE — ED Triage Notes (Signed)
Present with multiple complaints; numbness on right arm and face comes and go in nature. Complaints of abdominal pain, diarrhea, dizziness, and blurry vision.

## 2022-05-09 NOTE — Discharge Instructions (Signed)
Follow-up with your eye doctor for complete eye exam.  Follow-up with your primary care doctor next week to reassess your symptoms.  Return to the emergency room if you have any worsening symptoms.

## 2022-05-09 NOTE — ED Notes (Signed)
Patient transported to CT 

## 2022-05-09 NOTE — ED Provider Notes (Signed)
[facility] EMERGENCY DEPARTMENT Provider Note   CSN: [id number] Arrival date & time: 05/09/22  1601     History  Chief Complaint  Patient presents with   Abdominal Pain   Back Pain   Numbness   Diarrhea   Dizziness    Leslie Sanders is a 46 y.o. female.  Patient is a 46 year old female who presents with multiple complaints.  She said she has had the symptoms for the last 6 days.  She has had some intermittent numbness to her left upper arm and also her right face.  She says sometimes it is on her left face.  She has had some associated burly vision in both of her eyes.  She says its been going on for the last 6 days intermittently.  It comes and goes.  No associated headache.  No weakness in her extremities.  No numbness in her lower extremities.  She is also felt like she is going to pass out at times.  She sometimes has some palpitations.  No chest pain.  Sometimes she feels short of breath.  She has had some diarrhea.  She says she has been drinking lots of water to stay hydrated.  She says she just overall does not feel well.  No fevers.  She has had some nausea but no vomiting.  She has some cramping in her lower abdomen.  She says it feels like menstrual cramps but she is status post hysterectomy.  No urinary symptoms.  On chart review, she has a history of bipolar disorder, asthma/COPD, chronic back and neck pain.  She was seen by rheumatology yesterday for joint pains.  Her inflammatory markers were negative.  She denies any recent medication changes.  She reports that she saw her PCP today and was told to come to the emergency department for a head CT.      Home Medications Prior to Admission medications   Medication Sig Start Date End Date Taking? Authorizing Provider  albuterol (PROVENTIL) (2.5 MG/3ML) 0.083% nebulizer solution USE 1 VIAL VIA NEBULIZER EVERY 6 HOURS AS NEEDED FOR WHEEZING OR SHORTNESS OF BREATH 11/20/21   Charlott Holleresai, Nikita S, MD  albuterol (VENTOLIN HFA)  108 (90 Base) MCG/ACT inhaler INHALE 1 PUFF INTO THE LUNGS EVERY 6 HOURS AS NEEDED FOR WHEEZING OR SHORTNESS OF BREATH 01/21/22   Parrett, Tammy S, NP  azelastine (ASTELIN) 0.1 % nasal spray Place 2 sprays into both nostrils 2 (two) times daily. Use in each nostril as directed Patient taking differently: Place 2 sprays into both nostrils 2 (two) times daily. 08/15/20   Steffanie Dunnlark, Laura P, DO  Budeson-Glycopyrrol-Formoterol (BREZTRI AEROSPHERE) 160-9-4.8 MCG/ACT AERO Inhale 2 puffs into the lungs 2 (two) times daily. 03/17/22   Charlott Holleresai, Nikita S, MD  COVID-19 mRNA bivalent vaccine, Pfizer, (PFIZER COVID-19 VAC BIVALENT) injection Inject into the muscle. 04/21/22   Judyann MunsonSnider, Cynthia, MD  COVID-19 mRNA bivalent vaccine, Pfizer, injection Inject into the muscle. 09/13/21   Judyann MunsonSnider, Cynthia, MD  cyanocobalamin (,VITAMIN B-12,) 1000 MCG/ML injection Inject 1 mL into the muscle every 30 (thirty) days.    [provider]  docusate sodium (COLACE) 100 MG capsule Take 100 mg by mouth daily.    [provider]  DULoxetine (CYMBALTA) 60 MG capsule Take 60 mg by mouth daily.  03/10/19   [provider]  dupilumab (DUPIXENT) 300 MG/2ML prefilled syringe Inject 300 mg into the skin every 14 (fourteen) days. 05/02/22   Murrell ReddenGajera, Devki S, RPH-CPP  ferrous sulfate 325 (65  FE) MG tablet Take 1 tablet (325 mg total) by mouth 2 (two) times daily with a meal. 12/15/16   Oneta Rack, NP  fluticasone (FLONASE) 50 MCG/ACT nasal spray Place 2 sprays into both nostrils daily. 12/19/21   Charlott Holler, MD  folic acid (FOLVITE) 1 MG tablet Take 1 mg by mouth daily.  04/07/19   [provider]  glycopyrrolate (ROBINUL) 2 MG tablet Take 1 tablet (2 mg total) by mouth 2 (two) times daily. 01/18/20   Esterwood, Amy S, PA-C  hydrOXYzine (VISTARIL) 50 MG capsule Take 50 mg by mouth every 8 (eight) hours as needed for anxiety or itching. 09/20/20   [provider]  iron dextran complex 2,000 mg in sodium  chloride 0.9 % 1,000 mL Inject 2,000 mg into the vein once. Monthly for 6 months    [provider]  Lidocaine 1.8 % PTCH Apply topically.    [provider]  loratadine (CLARITIN) 10 MG tablet Take 1 tablet (10 mg total) by mouth daily. 08/28/21   Charlott Holler, MD  midodrine (PROAMATINE) 5 MG tablet Take 1 tablet (5 mg total) by mouth 3 (three) times daily with meals. 06/14/20   Rosalio Macadamia, NP  montelukast (SINGULAIR) 10 MG tablet Take 1 tablet (10 mg total) by mouth daily as needed. 12/19/21   Charlott Holler, MD  omeprazole (PRILOSEC) 40 MG capsule TAKE 1 CAPSULE(40 MG) BY MOUTH DAILY 12/25/20   Esterwood, Amy S, PA-C  pantoprazole (PROTONIX) 40 MG tablet Take 1 tablet (40 mg total) by mouth daily. 01/11/20   Steffanie Dunn, DO  pregabalin (LYRICA) 200 MG capsule Take 200 mg by mouth 2 (two) times daily. 09/11/20   [provider]  valACYclovir (VALTREX) 1000 MG tablet Take 1,000 mg by mouth 2 (two) times daily.    [provider]  Vitamin D, Ergocalciferol, (DRISDOL) 1.25 MG (50000 UT) CAPS capsule Take 50,000 Units by mouth every Monday.  07/25/19   [provider]      Allergies    Tylenol [acetaminophen], Adhesive  [tape], Hydrocodone-acetaminophen, Nsaids, and Latex    Review of Systems   Review of Systems  Constitutional:  Positive for fatigue. Negative for chills, diaphoresis and fever.  HENT:  Negative for congestion, rhinorrhea and sneezing.   Eyes:  Positive for visual disturbance.  Respiratory:  Positive for shortness of breath. Negative for cough and chest tightness.   Cardiovascular:  Positive for palpitations. Negative for chest pain and leg swelling.  Gastrointestinal:  Positive for diarrhea and nausea. Negative for abdominal pain, blood in stool and vomiting.  Genitourinary:  Positive for pelvic pain. Negative for difficulty urinating, flank pain, frequency and hematuria.  Musculoskeletal:  Negative for arthralgias and back pain.   Skin:  Negative for rash.  Neurological:  Positive for light-headedness and numbness. Negative for dizziness, speech difficulty, weakness and headaches.   Physical Exam Updated Vital Signs BP 128/74   Pulse 66   Temp 98.6 F (37 C) (Oral)   Resp 20   Ht  (1.575 m)   Wt 60.3 kg   LMP 10/07/2016   SpO2 99%   BMI 24.33 kg/m  Physical Exam Constitutional:      Appearance: She is well-developed.  HENT:     Head: Normocephalic and atraumatic.  Eyes:     Pupils: Pupils are equal, round, and reactive to light.  Cardiovascular:     Rate and Rhythm: Normal rate and regular rhythm.  Heart sounds: Normal heart sounds.  Pulmonary:     Effort: Pulmonary effort is normal. No respiratory distress.     Breath sounds: Normal breath sounds. No wheezing or rales.  Chest:     Chest wall: No tenderness.  Abdominal:     General: Bowel sounds are normal.     Palpations: Abdomen is soft.     Tenderness: There is no abdominal tenderness. There is no guarding or rebound.  Musculoskeletal:        General: Normal range of motion.     Cervical back: Normal range of motion and neck supple.  Lymphadenopathy:     Cervical: No cervical adenopathy.  Skin:    General: Skin is warm and dry.     Findings: No rash.  Neurological:     Mental Status: She is alert and oriented to person, place, and time.     Comments: Motor 5/5 all extremities Sensation grossly intact to LT all extremities Finger to Nose intact, no pronator drift CN II-XII grossly intact, she reports some subjective numbness to her face bilaterally Gait normal Visual fields full to confrontation    ED Results / Procedures / Treatments   Labs (all labs ordered are listed, but only abnormal results are displayed) Labs Reviewed  COMPREHENSIVE METABOLIC PANEL - Abnormal; Notable for the following components:      Result Value   Potassium 3.3 (*)    Glucose, Bld 142 (*)    Calcium 8.8 (*)    All other components within  normal limits  CBC WITH DIFFERENTIAL/PLATELET - Abnormal; Notable for the following components:   RDW 11.2 (*)    All other components within normal limits  URINALYSIS, ROUTINE W REFLEX MICROSCOPIC - Abnormal; Notable for the following components:   Ketones, ur 15 (*)    All other components within normal limits    EKG EKG Interpretation  Date/Time:  Friday May 09 2022 16:58:53 EDT Ventricular Rate:  64 PR Interval:  139 QRS Duration: 102 QT Interval:  422 QTC Calculation: 436 R Axis:   38 Text Interpretation: Sinus rhythm since last tracing no significant change Confirmed by Rolan Bucco [id number] on 05/09/2022 5:23:54 PM  Radiology CT Head Wo Contrast  Result Date: 05/09/2022 CLINICAL DATA:  Neuro deficit, acute, stroke suspected Intermittent right arm and face numbness. Dizziness. Blurry vision. EXAM: CT HEAD WITHOUT CONTRAST TECHNIQUE: Contiguous axial images were obtained from the base of the skull through the vertex without intravenous contrast. RADIATION DOSE REDUCTION: This exam was performed according to the departmental dose-optimization program which includes automated exposure control, adjustment of the mA and/or kV according to patient size and/or use of iterative reconstruction technique. COMPARISON:  Head CT 07/15/2019 FINDINGS: Brain: No intracranial hemorrhage, mass effect, or midline shift. No hydrocephalus. The basilar cisterns are patent. No evidence of territorial infarct or acute ischemia. No extra-axial or intracranial fluid collection. Vascular: No hyperdense vessel or unexpected calcification. Skull: Normal. Negative for fracture or focal lesion. Sinuses/Orbits: Paranasal sinuses and mastoid air cells are clear. The visualized orbits are unremarkable. Other: None. IMPRESSION: Negative noncontrast head CT. Electronically Signed   By: Narda Rutherford M.D.   On: 05/09/2022 17:25    Procedures Procedures    Medications Ordered in ED Medications - No data to  display  ED Course/ Medical Decision Making/ A&P                           Medical Decision Making Problems  Addressed: Blurry vision, bilateral: undiagnosed new problem with uncertain prognosis Paresthesia: undiagnosed new problem with uncertain prognosis  Amount and/or Complexity of Data Reviewed External Data Reviewed: ECG and notes. Labs: ordered. Decision-making details documented in ED Course. Radiology: ordered. Decision-making details documented in ED Course. ECG/medicine tests: ordered and independent interpretation performed. Decision-making details documented in ED Course.  Risk Decision regarding hospitalization.   Patient presents with a variety of symptoms.  She has blurry vision intermittently but its in both of her eyes.  She has some intermittent numbness in her left arm and the right side of her face although sometimes goes to the left side of her face.  She has some general fatigue, diarrhea, lightheadedness and palpitations.  Her symptoms do not seem to be consistent with a stroke.  Given the visual disturbance and neurologic concerns, head CT was performed which shows no evidence of mass or hemorrhage.  Her labs are nonconcerning.  Her EKG does not show any arrhythmias or ischemic changes.  Her urine is not consistent with infection.  She does not have a fever or other infectious symptoms.  No other symptoms that sound more concerning for ACS.  No symptoms that sound more concerning for aortic dissection/aneurysm.  She does not have a headache or other symptoms that sound more concerning for meningitis or other vascular issue.  At this time she is not complaining of symptoms.  I did discuss the findings with her and discussed having close follow-up with her eye doctor to have a complete eye exam and a follow-up visit with her primary care doctor.  She is amenable to this.  She was discharged home in good condition.  Return precautions were given.  Final Clinical  Impression(s) / ED Diagnoses Final diagnoses:  Blurry vision, bilateral  Paresthesia    Rx / DC Orders ED Discharge Orders     None         Rolan Bucco, MD 05/09/22 [phone]

## 2022-05-12 ENCOUNTER — Ambulatory Visit (INDEPENDENT_AMBULATORY_CARE_PROVIDER_SITE_OTHER): Payer: Medicare HMO | Admitting: *Deleted

## 2022-05-12 VITALS — BP 111/71 | HR 87 | Temp 99.0°F | Resp 20 | Ht 62.0 in | Wt 134.0 lb

## 2022-05-12 DIAGNOSIS — J4550 Severe persistent asthma, uncomplicated: Secondary | ICD-10-CM | POA: Diagnosis not present

## 2022-05-12 MED ORDER — DUPILUMAB 300 MG/2ML ~~LOC~~ SOSY
300.0000 mg | PREFILLED_SYRINGE | Freq: Once | SUBCUTANEOUS | Status: AC
  Administered 2022-05-12: 300 mg via SUBCUTANEOUS
  Filled 2022-05-12: qty 2

## 2022-05-12 NOTE — Progress Notes (Signed)
Diagnosis: Asthma  Provider:  Praveen Mannam, MD  Procedure: Injection  Dupixent (Dupilumab), Dose: 300 mg, Site: subcutaneous, Number of injections: 1  Discharge: Condition: Good, Destination: Home . AVS provided to patient.   Performed by:  Jordane Hisle A, RN       

## 2022-05-14 NOTE — Progress Notes (Signed)
NEW PATIENT Date of Service/Encounter:  05/15/22 Referring provider: Charlott Holler, MD Primary care provider: Spero Geralds, MD  Subjective:  Leslie Sanders is a 46 y.o. female with a PMHx of asthma, anemia, depression, alcohol dependence, fibromyalgia, cervical spine stenosis, HSV-1, migraines, sickel cell trait presenting today for evaluation of asthma and allergies. History obtained from: chart review and patient.   Chronic rhinitis: started in early adulthood and seems to be getting worse Symptoms include: rhinorrhea, post nasal drainage, sneezing, watery eyes, and itchy eyes , rhinorrhea is her worst symptoms Occurs year-round Potential triggers: outdoor pollen, animal dander, she does not have pets but brother has 2 dogs Treatments tried: Tried Xyzal, Careers adviser, Claritin, Zyrtec and they don't help; has used flonase, sinus rinses, don't help long Previous allergy testing:  yes-over 7 years ago, dust mites and many others; never tried injections History of reflux/heartburn:  no-has had in the past, lost weight which resolved symptoms  Asthma History: managed by [employer] -Diagnosed at age young child, around 53 yo.  -Current symptoms include cough and wheezing, SOB Every day daytime symptoms in past month, 3 x week nighttime awakenings in past month Using rescue inhaler 4 times in past month -Limitations to daily activity: some - 2 ED visits, 0 UC visits and 4 systemic steroids in the past year - 0 number of lifetime hospitalizations, 0 number of lifetime intubations.  - Identified Triggers:  heat, exercise, respiratory illness, dust, pollens - Up-to-date with Covid-19, and Flu, vaccines. - History of prior pneumonias: no - History of prior COVID-19 infection: no - Smoking exposure: smoked in the past (> 3 years) - smoked > 9 years 1/4 ppd Previous Diagnostics:  - Prior PFTs or spirometry: yes-last in 2021; ratio 63% (pre), FEV1 pre 62% and FEV1 (post) 74% + 18%  change, FVC 81% (pre) and 86% post-moderate obstruction with reversibility - Most Recent AEC (05/09/22): 200 -Most Recent Chest Imaging: CXR on (06/27/21): normal; Chest CT ordered but not obtained; normal CT angio in 2020 Management:  - Current regimen:  - Maintenance:Breztri BID, singulair, dupixent (since July 2021) flonase, albuterol Dupixent has helped but not so much that she feels reliant on that by itself - Rescue: Albuterol 2 puffs q4-6 hrs PRN, not using prior to exercise  She is from [place].  Moved to [place] around 16 years ago  Other allergy screening: Food allergy: no Medication allergy:  yes-tylenol-throat started to close; avoids NSAIDs due to hx of bariatric history Hymenoptera allergy: no Eczema:no Vaccinations are up to date.   Past Medical History: Past Medical History:  Diagnosis Date   Anemia    (a) chronic blood loss hx of menorrhagia s/p endometrial biopsy 04/2015 (b) iron deficient   Anxiety    B12 deficiency    Bipolar 1 disorder (HCC)    Cervical spinal stenosis    Cervical spondylosis without myelopathy 07/08/2016   Chronic pain of right thumb 04/28/2018   Depression    GERD (gastroesophageal reflux disease)    Hemorrhoids    Pt with constipation secondary to iron supplement. Pt has had rectal bleed secondary to internal hemorrhoids. 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   Migraine    PONV (postoperative nausea and vomiting)    PTSD (post-traumatic stress disorder)    Seasonal allergies    Vitamin E deficiency    Zinc deficiency    Medication List:  Current Outpatient Medications  Medication Sig Dispense Refill  albuterol (PROVENTIL) (2.5 MG/3ML) 0.083% nebulizer solution USE 1 VIAL VIA NEBULIZER EVERY 6 HOURS AS NEEDED FOR WHEEZING OR SHORTNESS OF BREATH 360 mL 1   albuterol (VENTOLIN HFA) 108 (90 Base) MCG/ACT inhaler INHALE 1 PUFF INTO THE LUNGS EVERY 6 HOURS AS NEEDED FOR WHEEZING OR SHORTNESS OF BREATH 6.7 g  3   azelastine (ASTELIN) 0.1 % nasal spray Place 2 sprays into both nostrils 2 (two) times daily. Use in each nostril as directed (Patient taking differently: Place 2 sprays into both nostrils 2 (two) times daily.) 30 mL 12   Budeson-Glycopyrrol-Formoterol (BREZTRI AEROSPHERE) 160-9-4.8 MCG/ACT AERO Inhale 2 puffs into the lungs 2 (two) times daily. 10.7 g 2   Carbinoxamine Maleate 4 MG TABS 4-8 mg up to twice daily as needed. May cause drowsiness.  Try during a time when you are at home and don't need to leave to see if you tolerate this.  If not, can use at night only. 30 tablet 3   cyanocobalamin (,VITAMIN B-12,) 1000 MCG/ML injection Inject 1 mL into the muscle every 30 (thirty) days.     docusate sodium (COLACE) 100 MG capsule Take 100 mg by mouth daily.     DULoxetine (CYMBALTA) 60 MG capsule Take 60 mg by mouth daily.      dupilumab (DUPIXENT) 300 MG/2ML prefilled syringe Inject 300 mg into the skin every 14 (fourteen) days. 4 mL 5   ferrous sulfate 325 (65 FE) MG tablet Take 1 tablet (325 mg total) by mouth 2 (two) times daily with a meal. 60 tablet 0   fluticasone (FLONASE) 50 MCG/ACT nasal spray Place 2 sprays into both nostrils daily. 16 g 11   folic acid (FOLVITE) 1 MG tablet Take 1 mg by mouth daily.      glycopyrrolate (ROBINUL) 2 MG tablet Take 1 tablet (2 mg total) by mouth 2 (two) times daily. 180 tablet 3   hydrOXYzine (VISTARIL) 50 MG capsule Take 50 mg by mouth every 8 (eight) hours as needed for anxiety or itching.     ipratropium (ATROVENT) 0.03 % nasal spray Place 2 sprays into both nostrils 3 (three) times daily as needed for rhinitis. 30 mL 3   iron dextran complex 2,000 mg in sodium chloride 0.9 % 1,000 mL Inject 2,000 mg into the vein once. Monthly for 6 months     Lidocaine 1.8 % PTCH Apply topically.     loratadine (CLARITIN) 10 MG tablet Take 1 tablet (10 mg total) by mouth daily. 30 tablet 11   midodrine (PROAMATINE) 5 MG tablet Take 1 tablet (5 mg total) by mouth 3  (three) times daily with meals. 270 tablet 3   montelukast (SINGULAIR) 10 MG tablet Take 1 tablet (10 mg total) by mouth daily as needed. 30 tablet 5   omeprazole (PRILOSEC) 40 MG capsule TAKE 1 CAPSULE(40 MG) BY MOUTH DAILY 90 capsule 3   pantoprazole (PROTONIX) 40 MG tablet Take 1 tablet (40 mg total) by mouth daily. 30 tablet 11   pregabalin (LYRICA) 200 MG capsule Take 200 mg by mouth 2 (two) times daily.     valACYclovir (VALTREX) 1000 MG tablet Take 1,000 mg by mouth 2 (two) times daily.     Vitamin D, Ergocalciferol, (DRISDOL) 1.25 MG (50000 UT) CAPS capsule Take 50,000 Units by mouth every Monday.      COVID-19 mRNA bivalent vaccine, Pfizer, (PFIZER COVID-19 VAC BIVALENT) injection Inject into the muscle. (Patient not taking: Reported on 05/15/2022) 0.3 mL 0   COVID-19 mRNA bivalent  vaccine, Pfizer, injection Inject into the muscle. (Patient not taking: Reported on 05/15/2022) 0.3 mL 0   No current facility-administered medications for this visit.   Known Allergies:  Allergies  Allergen Reactions   Tylenol [Acetaminophen] Anaphylaxis    Per patient   Adhesive  [Tape] Rash   Hydrocodone-Acetaminophen Other (See Comments) and Nausea And Vomiting   Nsaids Other (See Comments)    S/p bariatric surgery; increased risk of ulcers, gastritis and cancer   Latex Rash   Past Surgical History: Past Surgical History:  Procedure Laterality Date   ABDOMINAL HYSTERECTOMY     BREAST LUMPECTOMY Bilateral    BREAST REDUCTION SURGERY     Carpal Metacarpal Arthroplasty Right 05/13/2019   CHOLECYSTECTOMY  1990   HAND SURGERY     HYSTERECTOMY ABDOMINAL WITH SALPINGECTOMY  11/2017   POSTERIOR CERVICAL LAMINECTOMY Bilateral 05/23/2019   RIGHT/LEFT HEART CATH AND CORONARY ANGIOGRAPHY N/A 01/13/2020   Procedure: RIGHT/LEFT HEART CATH AND CORONARY ANGIOGRAPHY;  Surgeon: Lyn Records, MD;  Location: [facility];  Service: Cardiovascular;  Laterality: N/A;   ROUX-EN-Y GASTRIC BYPASS  2003   in [place]    SPINAL FUSION  05/23/2019   Family History: Family History  Problem Relation Age of Onset   Schizophrenia Maternal Aunt    ADD / ADHD Son    ODD Son    Asthma Son    Breast cancer Mother    Arthritis/Rheumatoid Mother    Lupus Mother    HIV Father    Rheum arthritis Maternal Grandmother    COPD Maternal Grandmother    COPD Paternal Grandmother    Social History: Roneshia lives in an apartment built 60 years ago, no water damage, carpet floors, electric heating, central AC, dogs outdoors, not using dust mite protection, no smoke exposure.  She works in [employer].  [place]  She does have a history of smoking.  Quit over 3 years ago.  Smoked 0.25-0.5 PPD x 9+ years.   ROS:  All other systems negative except as noted per HPI.  Objective:  Blood pressure (!) 96/56, pulse 76, temperature 97.6 F (36.4 C), temperature source Temporal, resp. rate 17, height 5' 1.75" (1.568 m), weight 133 lb 3.2 oz (60.4 kg), last menstrual period 10/07/2016, SpO2 97 %. Body mass index is 24.56 kg/m. Physical Exam:  General Appearance:  Alert, cooperative, no distress, appears stated age  Head:  Normocephalic, without obvious abnormality, atraumatic  Eyes:  Conjunctiva clear, EOM's intact  Nose: Nares normal, hypertrophic turbinates, normal mucosa, and no visible anterior polyps  Throat: Lips, tongue normal; teeth and gums normal, normal posterior oropharynx, tonsils 3+, and no tonsillar exudate  Neck: Supple, symmetrical  Lungs:   clear to auscultation bilaterally, Respirations unlabored, no coughing  Heart:  regular rate and rhythm and no murmur, Appears well perfused  Extremities: No edema  Skin: Skin color, texture, turgor normal, no rashes or lesions on visualized portions of skin  Neurologic: No gross deficits     Diagnostics: Spirometry:  Tracings reviewed. Her effort: Good reproducible efforts. FVC: 1.99L  FEV1: 1.40L, 62% predicted  FEV1/FVC ratio:  85%  Interpretation: Spirometry consistent with mixed obstructive and restrictive disease   Skin Testing: Environmental allergy panel and select foods.  Adequate controls. Results discussed with patient/family.  Airborne Adult Perc - 05/15/22 1027     Time Antigen Placed 1027    Allergen Manufacturer Waynette Buttery    Location Back    Number of Test 59  Panel 1 Select    1. Control-Buffer 50% Glycerol Negative    2. Control-Histamine 1 mg/ml 3+    3. Albumin saline Negative    4. Bahia Negative    5. French Southern Territ[place]ermuda Negative    6. Johnson Negative    7. Kentucky Blue Negative    8. Meadow Fescue Negative    9. Perennial Rye Negative    10. Sweet Vernal Negative    11. Timothy Negative    12. Cocklebur Negative    13. Burweed Marshelder Negative    14. Ragweed, short Negative    15. Ragweed, Giant Negative    16. Plantain,  English Negative    17. Lamb's Quarters Negative    18. Sheep Sorrell Negative    19. Rough Pigweed Negative    20. Marsh Elder, Rough Negative    21. Mugwort, Common 2+    22. Ash mix Negative    23. Birch mix Negative    24. Beech American Negative    25. Box, Elder Negative    26. Cedar, red Negative    27. Cottonwood, Guinea-BissauEastern Negative    28. Elm mix Negative    29. Hickory Negative    30. Maple mix Negative    31. Oak, Guinea-Bissau[place]astern mix Negative    32. Pecan Pollen Negative    33. Pine mix Negative    34. Sycamore Eastern Negative    35. Walnut, Black Pollen Negative    36. Alternaria alternata Negative    37. Cladosporium Herbarum Negative    38. Aspergillus mix Negative    39. Penicillium mix Negative    40. Bipolaris sorokiniana (Helminthosporium) Negative    41. Drechslera spicifera (Curvularia) Negative    42. Mucor plumbeus Negative    43. Fusarium moniliforme Negative    44. Aureobasidium pullulans (pullulara) Negative    45. Rhizopus oryzae Negative    46. Botrytis cinera Negative    47. Epicoccum nigrum Negative    48. Phoma betae Negative     49. Candida Albicans Negative    50. Trichophyton mentagrophytes Negative    51. Mite, D Farinae  5,000 AU/ml Negative    52. Mite, D Pteronyssinus  5,000 AU/ml Negative    53. Cat Hair 10,000 BAU/ml Negative    54.  Dog Epithelia Negative    55. Mixed Feathers Negative    56. Horse Epithelia 2+    57. Cockroach, German Negative    58. Mouse Negative    59. Tobacco Leaf Negative             Food Perc - 05/15/22 1027       Test Information   Time Antigen Placed 1027    Allergen Manufacturer Waynette ButteryGreer    Location Back    Number of allergen test 10    Food Select      Food   1. Peanut Negative    2. Soybean food Negative    3. Wheat, whole Negative    4. Sesame Negative    5. Milk, cow Negative    6. Egg White, chicken Negative    7. Casein Negative    8. Shellfish mix Negative    9. Fish mix Negative    10. Cashew Negative      Comments   Comments n             Intradermal - 05/15/22 1027     Time Antigen Placed 1027    Allergen Manufacturer Waynette ButteryGreer  Location Back    Number of Test 15    Intradermal Select             Allergy testing results were read and interpreted by myself, documented by clinical staff.  Assessment and Plan  Mrs. Waldrop is a 46 yo female with severe persistent asthma being evaluated at the request of her pulmonologist for chronic rhinitis.  Shockingly, her testing today did not show any allergies despite adequate positive and negative controls.  She was borderline to horses and 1 weed, but intradermal's to weeds were negative.  We discussed obtaining serum testing to confirm today's negative results.  If also negative, unfortunately she would not be a candidate for allergy injections.  We did add Atrovent nasal spray to help with rhinorrhea and drainage which are her most bothersome symptoms.   Could consider trial of a different biologic for asthma control such as Tezspire based on today's results.  Chronic Rhinitis - non- allergic: -  allergy testing today was negative - let's get labs to make sure your testing is really negative - Continue Nasal Steroid Spray: Options include Flonase (fluticasone), Nasocort (triamcinolone), Nasonex (mometasome) 1- 2 sprays in each nostril daily (can buy over-the-counter if not covered by insurance)  Best results if used daily. - Start Atrovent (Ipratropium Bromide) 1-2 sprays in each nostril up to 3 times a day as needed for runny nose/post nasal drip/drainage.  Use less frequently if airway gets too dry. - Continue Singulair (Montelukast)  nightly. - Start carbinoxamine 4-8 mg up to twice daily as needed. May cause drowsiness.  Try during a time when you are at home and don't need to leave to see if you tolerate this.  If not, can use at night only.   Asthma:  - continue follow-up with [facility]  - consider trial of Tezspire for asthma  History of severe reaction to Tylenol-strict avoidance advised  Follow-up in 3-4 months, sooner if needed.  It was a pleasure meeting you today!  Tonny Bollman, MD [facility]  This note in its entirety was forwarded to the Provider who requested this consultation.  Thank you for your kind referral. I appreciate the opportunity to take part in Nyiah's care. Please do not hesitate to contact me with questions.  Sincerely,  Tonny Bollman, MD [facility]

## 2022-05-15 ENCOUNTER — Telehealth: Payer: Self-pay

## 2022-05-15 ENCOUNTER — Encounter: Payer: Self-pay | Admitting: Internal Medicine

## 2022-05-15 ENCOUNTER — Ambulatory Visit (INDEPENDENT_AMBULATORY_CARE_PROVIDER_SITE_OTHER): Payer: Medicare HMO | Admitting: Internal Medicine

## 2022-05-15 VITALS — BP 96/56 | HR 76 | Temp 97.6°F | Resp 17 | Ht 61.75 in | Wt 133.2 lb

## 2022-05-15 DIAGNOSIS — J310 Chronic rhinitis: Secondary | ICD-10-CM | POA: Diagnosis not present

## 2022-05-15 DIAGNOSIS — J4550 Severe persistent asthma, uncomplicated: Secondary | ICD-10-CM | POA: Diagnosis not present

## 2022-05-15 MED ORDER — IPRATROPIUM BROMIDE 0.03 % NA SOLN: 2.0000 | Freq: Three times a day (TID) | NASAL | 3 refills | Status: DC | PRN

## 2022-05-15 MED ORDER — CARBINOXAMINE MALEATE 4 MG PO TABS: ORAL_TABLET | ORAL | 3 refills | Status: AC

## 2022-05-15 MED ORDER — LEVOCETIRIZINE DIHYDROCHLORIDE 5 MG PO TABS: 5.0000 mg | ORAL_TABLET | Freq: Every evening | ORAL | 5 refills | Status: DC

## 2022-05-15 NOTE — Telephone Encounter (Signed)
Levocetirizine if she accepts or she can buy over the counter which she is aware may be the case.

## 2022-05-15 NOTE — Addendum Note (Signed)
Addended by: Berna Bue on: 05/15/2022 03:57 PM   Modules accepted: Orders

## 2022-05-15 NOTE — Telephone Encounter (Signed)
Pts insurance does not cover carbinoxamine 4mg  tablets please advise to change to levocetirizine or cetirizine

## 2022-05-15 NOTE — Telephone Encounter (Signed)
Sent in xyzal 

## 2022-05-15 NOTE — Addendum Note (Signed)
Addended by: Maryjean Morn D on: 05/15/2022 01:56 PM   Modules accepted: Orders

## 2022-05-15 NOTE — Patient Instructions (Addendum)
Chronic Rhinitis - non- allergic: - allergy testing today was negative - let's get labs to make sure your testing is really negative - Continue Nasal Steroid Spray: Options include Flonase (fluticasone), Nasocort (triamcinolone), Nasonex (mometasome) 1- 2 sprays in each nostril daily (can buy over-the-counter if not covered by insurance)  Best results if used daily. - Start Atrovent (Ipratropium Bromide) 1-2 sprays in each nostril up to 3 times a day as needed for runny nose/post nasal drip/drainage.  Use less frequently if airway gets too dry. - Continue Singulair (Montelukast) 10mg  nightly. - Start carbinoxamine 4-8 mg up to twice daily as needed. May cause drowsiness.  Try during a time when you are at home and don't need to leave to see if you tolerate this.  If not, can use at night only.   Asthma:  - continue follow-up with [facility]  - consider trial of Tezspire for asthma  History of severe reaction to Tylenol-strict avoidance advised  Follow-up in 3-4 months, sooner if needed.  It was a pleasure meeting you today!  , MD [facility]

## 2022-05-20 LAB — ALLERGENS W/TOTAL IGE AREA 2

## 2022-05-21 NOTE — Progress Notes (Signed)
Please let Leslie Sanders know that her environmental allergy blood work is also negative (consistent with her skin testing).  Unfortunately she is not a candidate for allergy injections.  I am happy to keep seeing her for nonallergic rhinitis.   Let me know if she has any questions or concerns.

## 2022-05-22 ENCOUNTER — Ambulatory Visit: Payer: Self-pay

## 2022-05-26 ENCOUNTER — Ambulatory Visit (INDEPENDENT_AMBULATORY_CARE_PROVIDER_SITE_OTHER): Payer: Medicare HMO

## 2022-05-26 ENCOUNTER — Ambulatory Visit: Payer: Medicare Other | Admitting: Internal Medicine

## 2022-05-26 VITALS — BP 110/75 | HR 66 | Temp 98.3°F | Resp 16 | Ht 62.0 in | Wt 136.0 lb

## 2022-05-26 DIAGNOSIS — J4550 Severe persistent asthma, uncomplicated: Secondary | ICD-10-CM | POA: Diagnosis not present

## 2022-05-26 MED ORDER — DUPILUMAB 300 MG/2ML ~~LOC~~ SOSY
300.0000 mg | PREFILLED_SYRINGE | Freq: Once | SUBCUTANEOUS | Status: AC
  Administered 2022-05-26: 300 mg via SUBCUTANEOUS
  Filled 2022-05-26: qty 2

## 2022-05-26 NOTE — Progress Notes (Signed)
Diagnosis: Asthma  Provider:  Praveen Mannam, MD  Procedure: Injection  Dupixent (Dupilumab), Dose: 300 mg, Site: subcutaneous, Number of injections: 1  Discharge: Condition: Good, Destination: Home . AVS provided to patient.   Performed by:  Princes Finger E Loren Sawaya, LPN       

## 2022-05-29 ENCOUNTER — Other Ambulatory Visit (HOSPITAL_COMMUNITY): Payer: Self-pay

## 2022-06-04 ENCOUNTER — Other Ambulatory Visit (HOSPITAL_COMMUNITY): Payer: Self-pay

## 2022-06-05 ENCOUNTER — Ambulatory Visit: Payer: Self-pay

## 2022-06-09 ENCOUNTER — Ambulatory Visit (INDEPENDENT_AMBULATORY_CARE_PROVIDER_SITE_OTHER): Payer: Medicare HMO | Admitting: *Deleted

## 2022-06-09 VITALS — BP 118/80 | HR 86 | Temp 97.7°F | Resp 18 | Ht 62.0 in | Wt 133.6 lb

## 2022-06-09 DIAGNOSIS — J4550 Severe persistent asthma, uncomplicated: Secondary | ICD-10-CM | POA: Diagnosis not present

## 2022-06-09 MED ORDER — DUPILUMAB 300 MG/2ML ~~LOC~~ SOSY
300.0000 mg | PREFILLED_SYRINGE | Freq: Once | SUBCUTANEOUS | Status: AC
  Administered 2022-06-09: 300 mg via SUBCUTANEOUS
  Filled 2022-06-09: qty 2

## 2022-06-09 NOTE — Progress Notes (Signed)
Diagnosis: Asthma  Provider:  Praveen Mannam, MD  Procedure: Injection  Dupixent (Dupilumab), Dose: 300 mg, Site: subcutaneous, Number of injections: 1  Discharge: Condition: Good, Destination: Home . AVS provided to patient.   Performed by:  Jsean Taussig A, RN       

## 2022-06-19 ENCOUNTER — Telehealth (HOSPITAL_COMMUNITY): Payer: Self-pay

## 2022-06-19 NOTE — Telephone Encounter (Signed)
Pt is not interested in the pulmonary rehab program. Closed referral. 

## 2022-06-23 ENCOUNTER — Ambulatory Visit (INDEPENDENT_AMBULATORY_CARE_PROVIDER_SITE_OTHER): Payer: Medicare HMO | Admitting: *Deleted

## 2022-06-23 VITALS — BP 108/70 | HR 62 | Temp 98.6°F | Resp 18 | Ht 62.0 in | Wt 133.0 lb

## 2022-06-23 DIAGNOSIS — J4550 Severe persistent asthma, uncomplicated: Secondary | ICD-10-CM

## 2022-06-23 MED ORDER — DUPILUMAB 300 MG/2ML ~~LOC~~ SOSY
300.0000 mg | PREFILLED_SYRINGE | Freq: Once | SUBCUTANEOUS | Status: AC
  Administered 2022-06-23: 300 mg via SUBCUTANEOUS
  Filled 2022-06-23: qty 2

## 2022-06-23 NOTE — Progress Notes (Signed)
Diagnosis: Asthma  Provider:  Praveen Mannam, MD  Procedure: Injection  Dupixent (Dupilumab), Dose: 300 mg, Site: subcutaneous, Number of injections: 1  Discharge: Condition: Good, Destination: Home . AVS provided to patient.   Performed by:  Kayle Passarelli A, RN       

## 2022-06-26 ENCOUNTER — Other Ambulatory Visit (HOSPITAL_COMMUNITY): Payer: Self-pay

## 2022-07-02 ENCOUNTER — Other Ambulatory Visit (HOSPITAL_COMMUNITY): Payer: Self-pay

## 2022-07-04 ENCOUNTER — Other Ambulatory Visit (HOSPITAL_COMMUNITY): Payer: Self-pay

## 2022-07-07 ENCOUNTER — Ambulatory Visit (INDEPENDENT_AMBULATORY_CARE_PROVIDER_SITE_OTHER): Payer: Medicare HMO

## 2022-07-07 VITALS — BP 115/73 | HR 76 | Temp 98.1°F | Resp 16 | Ht 62.0 in | Wt 131.0 lb

## 2022-07-07 DIAGNOSIS — J4550 Severe persistent asthma, uncomplicated: Secondary | ICD-10-CM | POA: Diagnosis not present

## 2022-07-07 MED ORDER — DUPILUMAB 300 MG/2ML ~~LOC~~ SOSY
300.0000 mg | PREFILLED_SYRINGE | Freq: Once | SUBCUTANEOUS | Status: AC
  Administered 2022-07-07: 300 mg via SUBCUTANEOUS
  Filled 2022-07-07: qty 2

## 2022-07-07 NOTE — Progress Notes (Signed)
Diagnosis: Asthma  Provider:  Praveen Mannam, MD  Procedure: Injection  Dupixent (Dupilumab), Dose: 300 mg, Site: subcutaneous, Number of injections: 1  Discharge: Condition: Good, Destination: Home . AVS provided to patient.   Performed by:  Seraiah Nowack E Yohanna Tow, LPN       

## 2022-07-21 ENCOUNTER — Ambulatory Visit (INDEPENDENT_AMBULATORY_CARE_PROVIDER_SITE_OTHER): Payer: Medicare HMO

## 2022-07-21 VITALS — BP 114/77 | HR 99 | Temp 98.6°F | Resp 18 | Ht 62.0 in | Wt 130.6 lb

## 2022-07-21 DIAGNOSIS — J4550 Severe persistent asthma, uncomplicated: Secondary | ICD-10-CM | POA: Diagnosis not present

## 2022-07-21 MED ORDER — DUPILUMAB 300 MG/2ML ~~LOC~~ SOSY
300.0000 mg | PREFILLED_SYRINGE | Freq: Once | SUBCUTANEOUS | Status: AC
  Administered 2022-07-21: 300 mg via SUBCUTANEOUS
  Filled 2022-07-21: qty 2

## 2022-07-21 NOTE — Progress Notes (Signed)
Diagnosis: Asthma  Provider:  Praveen Mannam MD  Procedure: Injection  Dupixent (Dupilumab), Dose: 300 mg, Site: subcutaneous, Number of injections: 1  Discharge: Condition: Good, Destination: Home . AVS provided to patient.   Performed by:  Mirela Parsley, RN       

## 2022-07-24 ENCOUNTER — Other Ambulatory Visit (HOSPITAL_COMMUNITY): Payer: Self-pay

## 2022-07-24 ENCOUNTER — Other Ambulatory Visit: Payer: Self-pay | Admitting: Pharmacist

## 2022-07-30 ENCOUNTER — Other Ambulatory Visit (HOSPITAL_COMMUNITY): Payer: Self-pay

## 2022-08-04 ENCOUNTER — Ambulatory Visit (INDEPENDENT_AMBULATORY_CARE_PROVIDER_SITE_OTHER): Payer: Medicare HMO

## 2022-08-04 ENCOUNTER — Telehealth: Payer: Self-pay | Admitting: Internal Medicine

## 2022-08-04 VITALS — BP 117/85 | HR 85 | Temp 97.6°F | Resp 18 | Ht 62.0 in | Wt 133.2 lb

## 2022-08-04 DIAGNOSIS — J4550 Severe persistent asthma, uncomplicated: Secondary | ICD-10-CM | POA: Diagnosis not present

## 2022-08-04 MED ORDER — DUPILUMAB 300 MG/2ML ~~LOC~~ SOSY
300.0000 mg | PREFILLED_SYRINGE | Freq: Once | SUBCUTANEOUS | Status: AC
  Administered 2022-08-04: 300 mg via SUBCUTANEOUS

## 2022-08-04 NOTE — Progress Notes (Signed)
Diagnosis: Asthma  Provider:  Praveen Mannam MD  Procedure: Injection  Dupixent (Dupilumab), Dose: 300 mg, Site: subcutaneous, Number of injections: 1  Discharge: Condition: Good, Destination: Home . AVS provided to patient.   Performed by:  Giulio Bertino, RN       

## 2022-08-07 NOTE — Telephone Encounter (Signed)
Spoke with the pt  She is c/o nasal congestion over the past wk  She is using saline sinus rinse daily, along with flonase, atrovent and astelin nasal sprays  She states seen by allergy and they did not give any recs on nasal congestion  Please advise, thanks!  Allergies  Allergen Reactions   Tylenol [Acetaminophen] Anaphylaxis    Per patient   Adhesive  [Tape] Rash   Hydrocodone-Acetaminophen Other (See Comments) and Nausea And Vomiting   Nsaids Other (See Comments)    S/p bariatric surgery; increased risk of ulcers, gastritis and cancer   Latex Rash

## 2022-08-07 NOTE — Telephone Encounter (Signed)
Called pt and there was no answer-LMTCB °

## 2022-08-07 NOTE — Telephone Encounter (Signed)
Is she having any headaches, nasal drainage, or fevers? She can try OTC afrin or nasal decongestant but do not use longer than 3 days. I would also recommend that she COVID swab. Thanks.

## 2022-08-08 NOTE — Telephone Encounter (Signed)
Likely viral or allergies. If viral, can take at least 7-10 days to improve. She can try afrin nasal decongestant over the counter but do not use >3 days. She could also try tylenol sinus or other over the counter sinus relief. Thanks.

## 2022-08-08 NOTE — Telephone Encounter (Signed)
Called and spoke with pt letting her know info per KC and she verbalized understanding. Nothing further needed. ?

## 2022-08-08 NOTE — Telephone Encounter (Signed)
She is negative for Covid and wants to know if you are ok with taking anything else. She is using over the counter sprays with no relief. Please advise.

## 2022-08-08 NOTE — Telephone Encounter (Signed)
LMTCB

## 2022-08-08 NOTE — Telephone Encounter (Signed)
She is taking a covid swab at this time and will call back with results.

## 2022-08-15 ENCOUNTER — Encounter: Payer: Self-pay | Admitting: Internal Medicine

## 2022-08-15 ENCOUNTER — Ambulatory Visit (INDEPENDENT_AMBULATORY_CARE_PROVIDER_SITE_OTHER): Payer: Medicare HMO | Admitting: Internal Medicine

## 2022-08-15 VITALS — BP 98/56 | HR 89 | Temp 97.6°F | Resp 18 | Wt 130.3 lb

## 2022-08-15 DIAGNOSIS — J4550 Severe persistent asthma, uncomplicated: Secondary | ICD-10-CM | POA: Diagnosis not present

## 2022-08-15 DIAGNOSIS — J300 Vasomotor rhinitis: Secondary | ICD-10-CM | POA: Diagnosis not present

## 2022-08-15 DIAGNOSIS — Z886 Allergy status to analgesic agent status: Secondary | ICD-10-CM | POA: Diagnosis not present

## 2022-08-15 MED ORDER — IPRATROPIUM BROMIDE 0.03 % NA SOLN: 2.0000 | Freq: Three times a day (TID) | NASAL | 5 refills | Status: AC | PRN

## 2022-08-15 MED ORDER — HYDROCORTISONE 2.5 % EX OINT: TOPICAL_OINTMENT | CUTANEOUS | 3 refills | Status: AC

## 2022-08-15 MED ORDER — RYALTRIS 665-25 MCG/ACT NA SUSP: 2.0000 | Freq: Two times a day (BID) | NASAL | 5 refills | Status: AC | PRN

## 2022-08-15 NOTE — Progress Notes (Signed)
FOLLOW UP Date of Service/Encounter:  08/15/22   Subjective:  Leslie Sanders (DOB: Jul 29, 1976) is a 46 y.o. female with PMHx of asthma, anemia, depression, alcohol dependence, fibromyalgia, cervical spine stenosis, HSV-1, migraines, sickel cell trait who [facility] in re-evaluation of the following: nonallergic rhinitis History obtained from: chart review and patient.  For Review, LV was on 05/15/22  with Dr.Adanya Sosinski seen for intial visit for chronic rhinitis and asthma (managed by [facility]) .  We started atrovent and carbinoxamine, her allergy testing was negative.   Pertinent hx/diagnostics:  - chronic rhinitis: started in adulthood 2023 SPT borderline to mugwort and horse, negative ID's 2023 Lab work:  - negative environmental allergy panel  --Has asthma managed by [facility], diagnosed in childhood. Most Recent AEC (05/09/22): 200. -Most Recent Chest Imaging: CXR on (06/27/21): normal; Chest CT ordered but not obtained; normal CT angio in 2020. Currently on Breztri BID. And Dupixent since 2021.  - 2021; ratio 63% (pre), FEV1 pre 62% and FEV1 (post) 74% + 18% change, FVC 81% (pre) and 86% post-moderate obstruction with reversibility  Today presents for follow-up. She continues to have PND and nasal congestion.  She is constantly having drainage going down the back of her throat that she has to spit up.  She is constantly blowing her nose.  It never dries up. Irritants like perfumes and chemicals do seem to bother her. She was recently at her brother's house, and had to leave because she was unable to tolerate being in his home.  Home without smoking, but did have a dog, and did have AC.  Immediately upon entering his house had sneezing and congestion. She still feels that her symptoms are secondary to allergies. Additionally she is having some occasional shortness of breath, but is managed by  pulmonary and is on Dupixent.   She is unsure if this is secondary to exercise intolerance versus her asthma.  She does have a follow-up with pulmonary soon.    Allergies as of 08/15/2022       Reactions   Tylenol [acetaminophen] Anaphylaxis   Per patient   Adhesive  [tape] Rash   Hydrocodone-acetaminophen Other (See Comments), Nausea And Vomiting   Nsaids Other (See Comments)   S/p bariatric surgery; increased risk of ulcers, gastritis and cancer   Latex Rash        Medication List        Accurate as of August 15, 2022  1:31 PM. If you have any questions, ask your nurse or doctor.          albuterol (2.5 MG/3ML) 0.083% nebulizer solution Commonly known as: PROVENTIL USE 1 VIAL VIA NEBULIZER EVERY 6 HOURS AS NEEDED FOR WHEEZING OR SHORTNESS OF BREATH   albuterol 108 (90 Base) MCG/ACT inhaler Commonly known as: VENTOLIN HFA INHALE 1 PUFF INTO THE LUNGS EVERY 6 HOURS AS NEEDED FOR WHEEZING OR SHORTNESS OF BREATH   azelastine 0.1 % nasal spray Commonly known as: ASTELIN Place 2 sprays into both nostrils 2 (two) times daily. Use in each nostril as directed What changed: additional instructions   Breztri Aerosphere 160-9-4.8 MCG/ACT Aero Generic drug: Budeson-Glycopyrrol-Formoterol Inhale 2 puffs into the lungs 2 (two) times daily.   Carbinoxamine Maleate 4 MG Tabs 4-8 mg up to twice daily as needed. May cause drowsiness.  Try during a time when you are at home and don't need to leave to see if you tolerate this.  If not, can use  at night only.   cyanocobalamin 1000 MCG/ML injection Commonly known as: VITAMIN B12 Inject 1 mL into the muscle every 30 (thirty) days.   docusate sodium 100 MG capsule Commonly known as: COLACE Take 100 mg by mouth daily.   DULoxetine 60 MG capsule Commonly known as: CYMBALTA Take 60 mg by mouth daily.   Dupixent 300 MG/2ML prefilled syringe Generic drug: dupilumab Inject 300 mg into the skin every 14 (fourteen) days.   ferrous sulfate 325 (65 FE) MG  tablet Take 1 tablet (325 mg total) by mouth 2 (two) times daily with a meal.   fluticasone 50 MCG/ACT nasal spray Commonly known as: FLONASE Place 2 sprays into both nostrils daily.   folic acid 1 MG tablet Commonly known as: FOLVITE Take 1 mg by mouth daily.   glycopyrrolate 2 MG tablet Commonly known as: ROBINUL Take 1 tablet (2 mg total) by mouth 2 (two) times daily.   hydrocortisone 2.5 % ointment Apply topically twice daily as need to red sandpapery rash.   hydrOXYzine 50 MG capsule Commonly known as: VISTARIL Take 50 mg by mouth every 8 (eight) hours as needed for anxiety or itching.   ipratropium 0.03 % nasal spray Commonly known as: ATROVENT Place 2 sprays into both nostrils 3 (three) times daily as needed for rhinitis.   iron dextran complex 2,000 mg in sodium chloride 0.9 % 1,000 mL Inject 2,000 mg into the vein once. Monthly for 6 months   levocetirizine 5 MG tablet Commonly known as: XYZAL Take 1 tablet (5 mg total) by mouth every evening.   Lidocaine 1.8 % Ptch Apply topically.   loratadine 10 MG tablet Commonly known as: CLARITIN Take 1 tablet (10 mg total) by mouth daily.   midodrine 5 MG tablet Commonly known as: PROAMATINE Take 1 tablet (5 mg total) by mouth 3 (three) times daily with meals.   montelukast 10 MG tablet Commonly known as: SINGULAIR Take 1 tablet (10 mg total) by mouth daily as needed.   omeprazole 40 MG capsule Commonly known as: PRILOSEC TAKE 1 CAPSULE(40 MG) BY MOUTH DAILY   pantoprazole 40 MG tablet Commonly known as: Protonix Take 1 tablet (40 mg total) by mouth daily.   Pfizer COVID-19 Vac Bivalent injection Generic drug: COVID-19 mRNA bivalent vaccine Proofreader) Inject into the muscle.   Pfizer COVID-19 Vac Bivalent injection Generic drug: COVID-19 mRNA bivalent vaccine Proofreader) Inject into the muscle.   pregabalin 200 MG capsule Commonly known as: LYRICA Take 200 mg by mouth 2 (two) times daily.   Ryaltris  034-74 MCG/ACT Susp Generic drug: Olopatadine-Mometasone Place 2 sprays into the nose 2 (two) times daily as needed.   valACYclovir 1000 MG tablet Commonly known as: VALTREX Take 1,000 mg by mouth 2 (two) times daily.   Vitamin D (Ergocalciferol) 1.25 MG (50000 UNIT) Caps capsule Commonly known as: DRISDOL Take 50,000 Units by mouth every Monday.       Past Medical History:  Diagnosis Date   Anemia    (a) chronic blood loss hx of menorrhagia s/p endometrial biopsy 04/2015 (b) iron deficient   Anxiety    B12 deficiency    Bipolar 1 disorder (HCC)    Cervical spinal stenosis    Cervical spondylosis without myelopathy 07/08/2016   Chronic pain of right thumb 04/28/2018   Depression    GERD (gastroesophageal reflux disease)    Hemorrhoids    Pt with constipation secondary to iron supplement. Pt has had rectal bleed secondary to internal hemorrhoids. 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   Migraine    PONV (postoperative nausea and vomiting)    PTSD (post-traumatic stress disorder)    Seasonal allergies    Vitamin E deficiency    Zinc deficiency    Past Surgical History:  Procedure Laterality Date   ABDOMINAL HYSTERECTOMY     BREAST LUMPECTOMY Bilateral    BREAST REDUCTION SURGERY     Carpal Metacarpal Arthroplasty Right 05/13/2019   CHOLECYSTECTOMY  1990   HAND SURGERY     HYSTERECTOMY ABDOMINAL WITH SALPINGECTOMY  11/2017   POSTERIOR CERVICAL LAMINECTOMY Bilateral 05/23/2019   RIGHT/LEFT HEART CATH AND CORONARY ANGIOGRAPHY N/A 01/13/2020   Procedure: RIGHT/LEFT HEART CATH AND CORONARY ANGIOGRAPHY;  Surgeon: Lyn Records, MD;  Location: [facility];  Service: Cardiovascular;  Laterality: N/A;   ROUX-EN-Y GASTRIC BYPASS  2003   in [place]   SPINAL FUSION  05/23/2019   Otherwise, there have been no changes to her past medical history, surgical history, family history, or social history.  ROS: All others negative except as  noted per HPI.   Objective:  BP (!) 98/56   Pulse 89   Temp 97.6 F (36.4 C) (Temporal)   Resp 18   Wt 130 lb 4.8 oz (59.1 kg)   LMP 10/07/2016   SpO2 100%   BMI 23.83 kg/m  Body mass index is 23.83 kg/m. Physical Exam: General Appearance:  Alert, cooperative, no distress, appears stated age  Head:  Normocephalic, without obvious abnormality, atraumatic  Eyes:  Conjunctiva clear, EOM's intact  Nose: Nares normal, hypertrophic turbinates, normal mucosa, no visible anterior polyps, and septum midline  Throat: Lips, tongue normal; teeth and gums normal, normal posterior oropharynx  Neck: Supple, symmetrical  Lungs:   clear to auscultation bilaterally, Respirations unlabored, no coughing  Heart:  regular rate and rhythm and no murmur, Appears well perfused  Extremities: No edema  Skin: Skin color, texture, turgor normal, red erythematous patch on left upper extremity with central punctate lesion  Neurologic: No gross deficits   Assessment/Plan   Chronic Rhinitis - non- allergic/vasomotor with irritant  component-uncontrolled - allergy testing  was negative (skin and labs) - Start Ryaltris 1-2 sprays up to two times daily.  Use in place of Flonase - Increase Atrovent (Ipratropium Bromide) 1-2 sprays in each nostril up to 3 times a day as needed for runny nose/post nasal drip/drainage.  Use less frequently if airway gets too dry. - Continue Singulair (Montelukast) 10mg  nightly. - Carbinoxamine 4-8 mg up to twice daily as needed. May cause drowsiness.  Try during a time when you are at home and don't need to leave to see if you tolerate this.  If not, can use at night only.   Asthma: managed by pulm - continue follow-up with [facility]  - consider trial of Tezspire for asthma  Mosquito/insect bites-present on left upper arm-new problem, active Avoidance measures (DEET repellant for mosquitos, long clothing, etc) If bite occurs with raised rash:  - For itch: Topical steroid  (hydrocortisone cream) twice daily as needed + oral antihistamine (zyrtec) - For pain and swelling: Oral anti-inflammatory (ibuprofen), ice affected area  History of severe reaction to Tylenol-strict avoidance advised  Follow-up in 6 months, sooner if needed.  It was a pleasure seeing you today! If no improvement, let know if no improvement and we can refer to ENT.  [place], MD  [facility]

## 2022-08-15 NOTE — Patient Instructions (Addendum)
Chronic Rhinitis - non- allergic/vasomotor with irritant component: - allergy testing  was negative (skin and labs) - Start Ryaltris 1-2 sprays up to two times daily.  Use in place of Flonase - Increase Atrovent (Ipratropium Bromide) 1-2 sprays in each nostril up to 3 times a day as needed for runny nose/post nasal drip/drainage.  Use less frequently if airway gets too dry. - Continue Singulair (Montelukast) 10mg  nightly. - Carbinoxamine 4-8 mg up to twice daily as needed. May cause drowsiness.  Try during a time when you are at home and don't need to leave to see if you tolerate this.  If not, can use at night only.   Asthma:  - continue follow-up with [facility]  - consider trial of Tezspire for asthma  Mosquito/insect bites Avoidance measures (DEET repellant for mosquitos, long clothing, etc) If bite occurs with raised rash:  - For itch: Topical steroid (hydrocortisone cream) twice daily as needed + oral antihistamine (zyrtec) - For pain and swelling: Oral anti-inflammatory (ibuprofen), ice affected area  History of severe reaction to Tylenol-strict avoidance advised  Follow-up in 6 months, sooner if needed.  It was a pleasure seeing you today! If no improvement, let know if no improvement and we can refer to ENT.  [place], MD [facility]

## 2022-08-16 ENCOUNTER — Emergency Department (HOSPITAL_BASED_OUTPATIENT_CLINIC_OR_DEPARTMENT_OTHER)
Admission: EM | Admit: 2022-08-16 | Discharge: 2022-08-16 | Disposition: A | Discharge status: Home / Self Care | Payer: Medicare HMO | Attending: Emergency Medicine | Admitting: Emergency Medicine

## 2022-08-16 DIAGNOSIS — Z7951 Long term (current) use of inhaled steroids: Secondary | ICD-10-CM | POA: Insufficient documentation

## 2022-08-16 DIAGNOSIS — Z91040 Latex allergy status: Secondary | ICD-10-CM | POA: Insufficient documentation

## 2022-08-16 DIAGNOSIS — R519 Headache, unspecified: Secondary | ICD-10-CM | POA: Diagnosis present

## 2022-08-16 DIAGNOSIS — J45909 Unspecified asthma, uncomplicated: Secondary | ICD-10-CM | POA: Diagnosis not present

## 2022-08-16 DIAGNOSIS — Z20822 Contact with and (suspected) exposure to covid-19: Secondary | ICD-10-CM | POA: Diagnosis not present

## 2022-08-16 DIAGNOSIS — J069 Acute upper respiratory infection, unspecified: Secondary | ICD-10-CM | POA: Insufficient documentation

## 2022-08-16 LAB — SARS CORONAVIRUS 2 BY RT PCR: SARS Coronavirus 2 by RT PCR: NEGATIVE

## 2022-08-16 NOTE — ED Provider Notes (Signed)
[facility]  Provider Note  CSN: [id number] Arrival date & time: 08/16/22 0030  History Chief Complaint  Patient presents with   Headache    Rhinitis, body aches and chills      Leslie Sanders is a 46 y.o. female with history of asthma here with son who tested positive for Covid. She has had headache for the last 2 days but no known fever or cough. She would like to be checked to as she has required Paxlovid for prior Covid infection.    Home Medications Prior to Admission medications   Medication Sig Start Date End Date Taking? Authorizing Provider  albuterol (PROVENTIL) (2.5 MG/3ML) 0.083% nebulizer solution USE 1 VIAL VIA NEBULIZER EVERY 6 HOURS AS NEEDED FOR WHEEZING OR SHORTNESS OF BREATH 11/20/21   Spero Geralds, MD  albuterol (VENTOLIN HFA) 108 (90 Base) MCG/ACT inhaler INHALE 1 PUFF INTO THE LUNGS EVERY 6 HOURS AS NEEDED FOR WHEEZING OR SHORTNESS OF BREATH 01/21/22   Parrett, Tammy S, NP  azelastine (ASTELIN) 0.1 % nasal spray Place 2 sprays into both nostrils 2 (two) times daily. Use in each nostril as directed Patient taking differently: Place 2 sprays into both nostrils 2 (two) times daily. 08/15/20   [name], DO  Budeson-Glycopyrrol-Formoterol (BREZTRI AEROSPHERE) 160-9-4.8 MCG/ACT AERO Inhale 2 puffs into the lungs 2 (two) times daily. 03/17/22   Spero Geralds, MD  Carbinoxamine Maleate 4 MG TABS 4-8 mg up to twice daily as needed. May cause drowsiness.  Try during a time when you are at home and don't need to leave to see if you tolerate this.  If not, can use at night only. 05/15/22   Clemon Chambers, MD  COVID-19 mRNA bivalent vaccine, Pfizer, (PFIZER COVID-19 VAC BIVALENT) injection Inject into the muscle. Patient not taking: Reported on 05/15/2022 04/21/22   Carlyle Basques, MD  COVID-19 mRNA bivalent vaccine, Pfizer, injection Inject into the muscle. Patient not taking: Reported on 05/15/2022 09/13/21   Carlyle Basques, MD  cyanocobalamin  (,VITAMIN B-12,) 1000 MCG/ML injection Inject 1 mL into the muscle every 30 (thirty) days.    [provider]  docusate sodium (COLACE) 100 MG capsule Take 100 mg by mouth daily.    [provider]  DULoxetine (CYMBALTA) 60 MG capsule Take 60 mg by mouth daily.  03/10/19   [provider]  dupilumab (DUPIXENT) 300 MG/2ML prefilled syringe Inject 300 mg into the skin every 14 (fourteen) days. 05/02/22   Cassandria Anger, RPH-CPP  ferrous sulfate 325 (65 FE) MG tablet Take 1 tablet (325 mg total) by mouth 2 (two) times daily with a meal. 12/15/16   Derrill Center, NP  fluticasone (FLONASE) 50 MCG/ACT nasal spray Place 2 sprays into both nostrils daily. 12/19/21   Spero Geralds, MD  folic acid (FOLVITE) 1 MG tablet Take 1 mg by mouth daily.  04/07/19   [provider]  glycopyrrolate (ROBINUL) 2 MG tablet Take 1 tablet (2 mg total) by mouth 2 (two) times daily. 01/18/20   Esterwood, Amy S, PA-C  hydrocortisone 2.5 % ointment Apply topically twice daily as need to red sandpapery rash. 08/15/22   Clemon Chambers, MD  hydrOXYzine (VISTARIL) 50 MG capsule Take 50 mg by mouth every 8 (eight) hours as needed for anxiety or itching. 09/20/20   [provider]  ipratropium (ATROVENT) 0.03 % nasal spray Place 2 sprays into both nostrils 3 (three) times daily as needed for rhinitis. 08/15/22  Verlee Monte, MD  iron dextran complex 2,000 mg in sodium chloride 0.9 % 1,000 mL Inject 2,000 mg into the vein once. Monthly for 6 months    [provider]  levocetirizine (XYZAL) 5 MG tablet Take 1 tablet (5 mg total) by mouth every evening. 05/15/22   Verlee Monte, MD  Lidocaine 1.8 % PTCH Apply topically.    [provider]  loratadine (CLARITIN) 10 MG tablet Take 1 tablet (10 mg total) by mouth daily. 08/28/21   Charlott Holler, MD  midodrine (PROAMATINE) 5 MG tablet Take 1 tablet (5 mg total) by mouth 3 (three) times daily with meals. 06/14/20   Rosalio Macadamia, NP   montelukast (SINGULAIR) 10 MG tablet Take 1 tablet (10 mg total) by mouth daily as needed. 12/19/21   Charlott Holler, MD  Olopatadine-Mometasone Cristal Generous) [phone] MCG/ACT SUSP Place 2 sprays into the nose 2 (two) times daily as needed. 08/15/22   Verlee Monte, MD  omeprazole (PRILOSEC) 40 MG capsule TAKE 1 CAPSULE(40 MG) BY MOUTH DAILY 12/25/20   Esterwood, Amy S, PA-C  pantoprazole (PROTONIX) 40 MG tablet Take 1 tablet (40 mg total) by mouth daily. 01/11/20   Steffanie Dunn, DO  pregabalin (LYRICA) 200 MG capsule Take 200 mg by mouth 2 (two) times daily. 09/11/20   [provider]  valACYclovir (VALTREX) 1000 MG tablet Take 1,000 mg by mouth 2 (two) times daily.    [provider]  Vitamin D, Ergocalciferol, (DRISDOL) 1.25 MG (50000 UT) CAPS capsule Take 50,000 Units by mouth every Monday.  07/25/19   [provider]     Allergies    Tylenol [acetaminophen], Adhesive  [tape], Hydrocodone-acetaminophen, Nsaids, and Latex   Review of Systems   Review of Systems Please see HPI for pertinent positives and negatives  Physical Exam BP 122/77 (BP Location: Right Arm)   Pulse (!) 52   Temp 98 F (36.7 C) (Oral)   Resp 19   LMP 10/07/2016   SpO2 100%   Physical Exam Vitals and nursing note reviewed.  HENT:     Head: Normocephalic.     Nose: Nose normal.  Eyes:     Extraocular Movements: Extraocular movements intact.  Pulmonary:     Effort: Pulmonary effort is normal.  Musculoskeletal:        General: Normal range of motion.     Cervical back: Neck supple.  Skin:    Findings: No rash (on exposed skin).  Neurological:     Mental Status: She is alert and oriented to person, place, and time.  Psychiatric:        Mood and Affect: Mood normal.     ED Results / Procedures / Treatments   EKG None  Procedures Procedures  Medications Ordered in the ED Medications - No data to display  Initial Impression and Plan  Covid test ordered and pending. Recent  labs showed normal renal function.   ED Course   Clinical Course as of 08/16/22 0131  Sat Aug 16, 2022  0130 Covid is neg. Patient advised to isolate from her son with Covid and to wear masks when in the house together. PCP follow up if not feeling better in a couple of days.  [CS]    Clinical Course User Index [CS] Pollyann Savoy, MD     MDM Rules/Calculators/A&P Medical Decision Making Problems Addressed: Upper respiratory tract infection, unspecified type: acute illness or injury  Amount and/or Complexity of Data Reviewed Labs:  ordered. Decision-making details documented in ED Course.  Risk OTC drugs.    Final Clinical Impression(s) / ED Diagnoses Final diagnoses:  Upper respiratory tract infection, unspecified type    Rx / DC Orders ED Discharge Orders     None        Pollyann Savoy, MD 08/16/22 0131

## 2022-08-18 ENCOUNTER — Ambulatory Visit (INDEPENDENT_AMBULATORY_CARE_PROVIDER_SITE_OTHER): Payer: Medicare HMO

## 2022-08-18 VITALS — BP 117/79 | HR 63 | Temp 98.0°F | Resp 18 | Ht 62.0 in | Wt 129.6 lb

## 2022-08-18 DIAGNOSIS — J4550 Severe persistent asthma, uncomplicated: Secondary | ICD-10-CM

## 2022-08-18 MED ORDER — DUPILUMAB 300 MG/2ML ~~LOC~~ SOSY
300.0000 mg | PREFILLED_SYRINGE | Freq: Once | SUBCUTANEOUS | Status: AC
  Administered 2022-08-18: 300 mg via SUBCUTANEOUS
  Filled 2022-08-18: qty 2

## 2022-08-18 NOTE — Progress Notes (Signed)
Diagnosis: Asthma  Provider:  Praveen Mannam MD  Procedure: Injection  Dupixent (Dupilumab), Dose: 300 mg, Site: subcutaneous, Number of injections: 1  Discharge: Condition: Good, Destination: Home . AVS provided to patient.   Performed by:  Damascus Feldpausch, RN       

## 2022-08-19 ENCOUNTER — Other Ambulatory Visit (HOSPITAL_COMMUNITY): Payer: Self-pay

## 2022-08-28 ENCOUNTER — Other Ambulatory Visit (HOSPITAL_COMMUNITY): Payer: Self-pay

## 2022-09-01 ENCOUNTER — Ambulatory Visit (INDEPENDENT_AMBULATORY_CARE_PROVIDER_SITE_OTHER): Payer: Medicare HMO

## 2022-09-01 VITALS — BP 105/72 | HR 64 | Temp 98.0°F | Resp 20 | Ht 62.0 in | Wt 128.4 lb

## 2022-09-01 DIAGNOSIS — J4550 Severe persistent asthma, uncomplicated: Secondary | ICD-10-CM | POA: Diagnosis not present

## 2022-09-01 MED ORDER — DUPILUMAB 300 MG/2ML ~~LOC~~ SOSY
300.0000 mg | PREFILLED_SYRINGE | Freq: Once | SUBCUTANEOUS | Status: AC
  Administered 2022-09-01: 300 mg via SUBCUTANEOUS

## 2022-09-01 NOTE — Progress Notes (Signed)
Diagnosis: Asthma  Provider:  Praveen Mannam MD  Procedure: Injection  Dupixent (Dupilumab), Dose: 300 mg, Site: subcutaneous, Number of injections: 1   Discharge: Condition: Good, Destination: Home . AVS provided to patient.   Performed by:  Sariya Trickey, RN       

## 2022-09-02 ENCOUNTER — Ambulatory Visit: Payer: Medicare HMO

## 2022-09-02 ENCOUNTER — Telehealth: Payer: Self-pay | Admitting: Internal Medicine

## 2022-09-02 NOTE — Telephone Encounter (Signed)
Azelastine and flonase are covered advise to change in therapy

## 2022-09-02 NOTE — Telephone Encounter (Signed)
Pt  states Ryaltris is not covered by INS

## 2022-09-03 MED ORDER — AZELASTINE HCL 0.1 % NA SOLN: 2.0000 | Freq: Two times a day (BID) | NASAL | 5 refills | Status: DC

## 2022-09-03 MED ORDER — FLUTICASONE PROPIONATE 50 MCG/ACT NA SUSP: 2.0000 | Freq: Every day | NASAL | 5 refills | Status: AC

## 2022-09-03 NOTE — Telephone Encounter (Signed)
Yes please. Thank you

## 2022-09-03 NOTE — Telephone Encounter (Signed)
Sent in flonase and azelastine  pt informed sent to [facility] [address]

## 2022-09-03 NOTE — Addendum Note (Signed)
Addended by: Felipa Emory on: 09/03/2022 03:27 PM   Modules accepted: Orders

## 2022-09-16 ENCOUNTER — Ambulatory Visit: Payer: Medicare HMO

## 2022-09-18 ENCOUNTER — Other Ambulatory Visit (HOSPITAL_COMMUNITY): Payer: Self-pay

## 2022-09-19 ENCOUNTER — Ambulatory Visit (INDEPENDENT_AMBULATORY_CARE_PROVIDER_SITE_OTHER): Payer: Medicare HMO

## 2022-09-19 VITALS — BP 119/81 | HR 63 | Temp 98.4°F | Resp 16 | Ht 62.0 in | Wt 123.4 lb

## 2022-09-19 DIAGNOSIS — J4550 Severe persistent asthma, uncomplicated: Secondary | ICD-10-CM

## 2022-09-19 MED ORDER — DUPILUMAB 300 MG/2ML ~~LOC~~ SOSY
300.0000 mg | PREFILLED_SYRINGE | Freq: Once | SUBCUTANEOUS | Status: AC
  Administered 2022-09-19: 300 mg via SUBCUTANEOUS
  Filled 2022-09-19: qty 2

## 2022-09-19 NOTE — Progress Notes (Signed)
Diagnosis: Asthma  Provider:  Praveen Mannam MD  Procedure: Injection  Dupixent (Dupilumab), Dose: 300 mg, Site: subcutaneous, Number of injections: 1  Post Care: Patient declined observation  Discharge: Condition: Good, Destination: Home . AVS provided to patient.   Performed by:  Purva Vessell E Beren Yniguez, LPN       

## 2022-09-24 ENCOUNTER — Other Ambulatory Visit (HOSPITAL_COMMUNITY): Payer: Self-pay

## 2022-09-30 ENCOUNTER — Ambulatory Visit: Payer: Medicare HMO

## 2022-10-03 ENCOUNTER — Ambulatory Visit (INDEPENDENT_AMBULATORY_CARE_PROVIDER_SITE_OTHER): Payer: Medicare HMO

## 2022-10-03 VITALS — BP 106/71 | HR 59 | Temp 97.9°F | Resp 18 | Ht 62.0 in | Wt 125.0 lb

## 2022-10-03 DIAGNOSIS — J4550 Severe persistent asthma, uncomplicated: Secondary | ICD-10-CM

## 2022-10-03 MED ORDER — DUPILUMAB 300 MG/2ML ~~LOC~~ SOSY
300.0000 mg | PREFILLED_SYRINGE | Freq: Once | SUBCUTANEOUS | Status: AC
  Administered 2022-10-03: 300 mg via SUBCUTANEOUS
  Filled 2022-10-03: qty 2

## 2022-10-03 NOTE — Progress Notes (Signed)
Diagnosis: Asthma  Provider:  Marshell Garfinkel MD  Procedure: Injection  Dupixent (Dupilumab), Dose: 300 mg, Site: subcutaneous, Number of injections: 1  Post Care:     Discharge: Condition: Good, Destination: Home . AVS provided to patient.   Performed by:  Cleophus Molt, RN

## 2022-10-17 ENCOUNTER — Telehealth: Payer: Self-pay | Admitting: Internal Medicine

## 2022-10-17 ENCOUNTER — Ambulatory Visit (INDEPENDENT_AMBULATORY_CARE_PROVIDER_SITE_OTHER): Payer: Medicare HMO | Admitting: *Deleted

## 2022-10-17 VITALS — BP 106/72 | HR 69 | Temp 98.1°F | Resp 18 | Ht 62.0 in | Wt 125.6 lb

## 2022-10-17 DIAGNOSIS — J4550 Severe persistent asthma, uncomplicated: Secondary | ICD-10-CM | POA: Diagnosis not present

## 2022-10-17 MED ORDER — ALBUTEROL SULFATE HFA 108 (90 BASE) MCG/ACT IN AERS: INHALATION_SPRAY | RESPIRATORY_TRACT | 6 refills | Status: DC

## 2022-10-17 MED ORDER — BREZTRI AEROSPHERE 160-9-4.8 MCG/ACT IN AERO: 2.0000 | INHALATION_SPRAY | Freq: Two times a day (BID) | RESPIRATORY_TRACT | 6 refills | Status: DC

## 2022-10-17 MED ORDER — DUPILUMAB 300 MG/2ML ~~LOC~~ SOSY
300.0000 mg | PREFILLED_SYRINGE | Freq: Once | SUBCUTANEOUS | Status: AC
  Administered 2022-10-17: 300 mg via SUBCUTANEOUS
  Filled 2022-10-17: qty 2

## 2022-10-17 NOTE — Telephone Encounter (Signed)
Pt called back and I let her know that the med was sent. Pt verbalized understanding. Nothing further needed.

## 2022-10-17 NOTE — Telephone Encounter (Signed)
Call made to pharmacy, per staff they just need refills sent in. Refill sent.    LMTCB with patient

## 2022-10-17 NOTE — Progress Notes (Signed)
Diagnosis: Asthma  Provider:  Praveen Mannam MD  Procedure: Injection  Dupixent (Dupilumab), Dose: 300 mg, Site: subcutaneous, Number of injections: 1  Post Care: Observation period completed  Discharge: Condition: Good, Destination: Home . AVS provided to patient.   Performed by:  Caytlyn Evers A, RN       

## 2022-10-21 ENCOUNTER — Other Ambulatory Visit (HOSPITAL_COMMUNITY): Payer: Self-pay

## 2022-10-21 ENCOUNTER — Other Ambulatory Visit: Payer: Self-pay

## 2022-10-23 ENCOUNTER — Other Ambulatory Visit (HOSPITAL_COMMUNITY): Payer: Self-pay

## 2022-10-24 ENCOUNTER — Other Ambulatory Visit: Payer: Self-pay | Admitting: Pharmacist

## 2022-10-24 ENCOUNTER — Other Ambulatory Visit: Payer: Self-pay

## 2022-10-24 ENCOUNTER — Other Ambulatory Visit (HOSPITAL_COMMUNITY): Payer: Self-pay

## 2022-10-24 DIAGNOSIS — J4550 Severe persistent asthma, uncomplicated: Secondary | ICD-10-CM

## 2022-10-24 MED ORDER — DUPIXENT 300 MG/2ML ~~LOC~~ SOSY
300.0000 mg | PREFILLED_SYRINGE | SUBCUTANEOUS | 2 refills | Status: DC
  Filled 2022-10-24: qty 4, 28d supply, fill #0
  Filled 2022-11-20: qty 4, 28d supply, fill #1
  Filled 2022-12-18: qty 4, 28d supply, fill #2

## 2022-10-24 NOTE — Telephone Encounter (Signed)
Refill sent for DUPIXENT to [facility]: [phone]   Dose: 300 mg SQ every 2 weeks  Last OV: 10/21/22 Provider: Dr. Celine Mans  Next OV: not yet scheduled  Chesley Mires, PharmD, MPH, BCPS Clinical Pharmacist (Rheumatology and Pulmonology)

## 2022-10-27 ENCOUNTER — Other Ambulatory Visit (HOSPITAL_COMMUNITY): Payer: Self-pay

## 2022-11-03 ENCOUNTER — Ambulatory Visit (INDEPENDENT_AMBULATORY_CARE_PROVIDER_SITE_OTHER): Payer: Medicare HMO

## 2022-11-03 VITALS — BP 115/68 | HR 78 | Temp 98.5°F | Resp 20 | Ht 62.0 in | Wt 123.8 lb

## 2022-11-03 DIAGNOSIS — J4550 Severe persistent asthma, uncomplicated: Secondary | ICD-10-CM | POA: Diagnosis not present

## 2022-11-03 MED ORDER — DUPILUMAB 300 MG/2ML ~~LOC~~ SOSY
300.0000 mg | PREFILLED_SYRINGE | Freq: Once | SUBCUTANEOUS | Status: AC
  Administered 2022-11-03: 300 mg via SUBCUTANEOUS
  Filled 2022-11-03: qty 2

## 2022-11-03 NOTE — Progress Notes (Signed)
Diagnosis: Asthma  Provider:  Praveen Mannam MD  Procedure: Injection  Dupixent (Dupilumab), Dose: 300 mg, Site: subcutaneous, Number of injections: 1   Discharge: Condition: Good, Destination: Home . AVS provided to patient.   Performed by:  Avel Ogawa, RN       

## 2022-11-04 ENCOUNTER — Ambulatory Visit: Payer: Medicare HMO

## 2022-11-18 ENCOUNTER — Ambulatory Visit (INDEPENDENT_AMBULATORY_CARE_PROVIDER_SITE_OTHER): Payer: Medicare HMO | Admitting: *Deleted

## 2022-11-18 ENCOUNTER — Telehealth: Payer: Self-pay | Admitting: Pharmacy Technician

## 2022-11-18 VITALS — BP 95/66 | HR 68 | Temp 97.7°F | Resp 16 | Ht 62.0 in | Wt 124.4 lb

## 2022-11-18 DIAGNOSIS — J4550 Severe persistent asthma, uncomplicated: Secondary | ICD-10-CM | POA: Diagnosis not present

## 2022-11-18 MED ORDER — DUPILUMAB 300 MG/2ML ~~LOC~~ SOSY
300.0000 mg | PREFILLED_SYRINGE | Freq: Once | SUBCUTANEOUS | Status: AC
  Administered 2022-11-18: 300 mg via SUBCUTANEOUS
  Filled 2022-11-18: qty 2

## 2022-11-18 NOTE — Progress Notes (Signed)
Diagnosis: Asthma  Provider:  Praveen Mannam MD  Procedure: Injection  Dupixent (Dupilumab), Dose: 300 mg, Site: subcutaneous, Number of injections: 1  Post Care: Observation period completed  Discharge: Condition: Good, Destination: Home . AVS provided to patient.   Performed by:  Zoriyah Scheidegger A, RN       

## 2022-11-18 NOTE — Telephone Encounter (Signed)
PA RENEWAL:  Auth Submission: APPROVED Payer: [employer] medic[employer] Medication & CPT/J Code(s) submitted:  DUPIXENT 300MG  SYG Q2WKS Route of submission (phone, fax, portal):  Phone # Fax # Auth type: Buy/Bill Units/visits requested: Q2WKS Reference number: COVER MY MEDS: [id number] Approval from: 12/29/20 to 12/08/23

## 2022-11-20 ENCOUNTER — Other Ambulatory Visit: Payer: Self-pay

## 2022-11-21 ENCOUNTER — Other Ambulatory Visit: Payer: Self-pay

## 2022-11-26 ENCOUNTER — Other Ambulatory Visit: Payer: Self-pay

## 2022-12-02 ENCOUNTER — Ambulatory Visit (INDEPENDENT_AMBULATORY_CARE_PROVIDER_SITE_OTHER): Payer: Medicare HMO

## 2022-12-02 VITALS — BP 106/70 | HR 71 | Temp 98.3°F | Resp 18 | Ht 62.0 in | Wt 125.6 lb

## 2022-12-02 DIAGNOSIS — J4550 Severe persistent asthma, uncomplicated: Secondary | ICD-10-CM | POA: Diagnosis not present

## 2022-12-02 MED ORDER — DUPILUMAB 300 MG/2ML ~~LOC~~ SOSY
300.0000 mg | PREFILLED_SYRINGE | Freq: Once | SUBCUTANEOUS | Status: AC
  Administered 2022-12-02: 300 mg via SUBCUTANEOUS
  Filled 2022-12-02: qty 2

## 2022-12-02 NOTE — Progress Notes (Cosign Needed Addendum)
Diagnosis: Asthma  Provider:  Praveen Mannam MD  Procedure: Injection  Dupixent (Dupilumab), Dose: 300 mg, Site: subcutaneous, Number of injections: 1  Post Care:     Discharge: Condition: Good, Destination: Home . AVS provided to patient.   Performed by:  Reina Wilton, RN       

## 2022-12-15 ENCOUNTER — Telehealth: Payer: Self-pay

## 2022-12-15 ENCOUNTER — Ambulatory Visit (INDEPENDENT_AMBULATORY_CARE_PROVIDER_SITE_OTHER): Payer: Medicare HMO

## 2022-12-15 VITALS — BP 109/68 | HR 59 | Temp 97.6°F | Resp 20 | Wt 125.0 lb

## 2022-12-15 DIAGNOSIS — J4550 Severe persistent asthma, uncomplicated: Secondary | ICD-10-CM

## 2022-12-15 MED ORDER — DUPILUMAB 300 MG/2ML ~~LOC~~ SOSY
300.0000 mg | PREFILLED_SYRINGE | Freq: Once | SUBCUTANEOUS | Status: AC
  Administered 2022-12-15: 300 mg via SUBCUTANEOUS

## 2022-12-15 NOTE — Progress Notes (Signed)
Diagnosis: Asthma  Provider:  Praveen Mannam MD  Procedure: Injection  Dupixent (Dupilumab), Dose: 300 mg, Site: subcutaneous, Number of injections: 1   Discharge: Condition: Good, Destination: Home . AVS provided to patient.   Performed by:  Marzella Miracle E Veleria Barnhardt, LPN       

## 2022-12-15 NOTE — Telephone Encounter (Signed)
Called to remind pt of appointment originally scheduled for 12/16/22 @1 :00pm. Patient wanted to cancel that appointment and come in today. I advised that it was 1 day early for her injection and it may not be covered on insurance if she gets it early. She says she has never had that problem before when receiving medication early and insisted that she get injection today.

## 2022-12-16 ENCOUNTER — Ambulatory Visit: Payer: Medicare HMO

## 2022-12-18 ENCOUNTER — Other Ambulatory Visit (HOSPITAL_COMMUNITY): Payer: Self-pay

## 2022-12-23 ENCOUNTER — Other Ambulatory Visit (HOSPITAL_COMMUNITY): Payer: Self-pay

## 2022-12-30 ENCOUNTER — Ambulatory Visit (INDEPENDENT_AMBULATORY_CARE_PROVIDER_SITE_OTHER): Payer: Medicare HMO

## 2022-12-30 VITALS — BP 122/79 | HR 57 | Temp 97.7°F | Resp 18 | Ht 62.0 in | Wt 124.2 lb

## 2022-12-30 DIAGNOSIS — J4550 Severe persistent asthma, uncomplicated: Secondary | ICD-10-CM | POA: Diagnosis not present

## 2022-12-30 MED ORDER — DUPILUMAB 300 MG/2ML ~~LOC~~ SOSY
300.0000 mg | PREFILLED_SYRINGE | Freq: Once | SUBCUTANEOUS | Status: AC
  Administered 2022-12-30: 300 mg via SUBCUTANEOUS

## 2022-12-30 NOTE — Progress Notes (Signed)
Diagnosis: Asthma  Provider:  Praveen Mannam MD  Procedure: Injection  Dupixent (Dupilumab), Dose: 300 mg, Site: subcutaneous, Number of injections: 1  Post Care:  n/a  Discharge: Condition: Good, Destination: Home . AVS provided to patient.   Performed by:  Giankarlo Leamer, RN       

## 2023-01-09 ENCOUNTER — Other Ambulatory Visit: Payer: Self-pay | Admitting: Critical Care Medicine

## 2023-01-13 ENCOUNTER — Ambulatory Visit (INDEPENDENT_AMBULATORY_CARE_PROVIDER_SITE_OTHER): Payer: Medicare HMO

## 2023-01-13 VITALS — BP 112/75 | HR 57 | Temp 97.5°F | Resp 16 | Ht 62.0 in | Wt 120.2 lb

## 2023-01-13 DIAGNOSIS — J4550 Severe persistent asthma, uncomplicated: Secondary | ICD-10-CM

## 2023-01-13 MED ORDER — DUPILUMAB 300 MG/2ML ~~LOC~~ SOSY
300.0000 mg | PREFILLED_SYRINGE | Freq: Once | SUBCUTANEOUS | Status: AC
  Administered 2023-01-13: 300 mg via SUBCUTANEOUS

## 2023-01-13 NOTE — Progress Notes (Signed)
Diagnosis: Injection  Provider:  Marshell Garfinkel MD  Procedure: Injection  Dupixent (Dupilumab), Dose: 300 mg, Site: subcutaneous, Number of injections: 1   Discharge: Condition: Good, Destination: Home .   AVS Provided  Performed by:  Koren Shiver, RN

## 2023-01-14 ENCOUNTER — Other Ambulatory Visit: Payer: Self-pay | Admitting: Internal Medicine

## 2023-01-14 DIAGNOSIS — J4550 Severe persistent asthma, uncomplicated: Secondary | ICD-10-CM

## 2023-01-15 ENCOUNTER — Other Ambulatory Visit (HOSPITAL_COMMUNITY): Payer: Self-pay

## 2023-01-15 ENCOUNTER — Other Ambulatory Visit: Payer: Self-pay | Admitting: Internal Medicine

## 2023-01-15 DIAGNOSIS — J4550 Severe persistent asthma, uncomplicated: Secondary | ICD-10-CM

## 2023-01-19 ENCOUNTER — Other Ambulatory Visit (HOSPITAL_COMMUNITY): Payer: Self-pay

## 2023-01-19 ENCOUNTER — Other Ambulatory Visit: Payer: Self-pay | Admitting: Internal Medicine

## 2023-01-19 DIAGNOSIS — J4550 Severe persistent asthma, uncomplicated: Secondary | ICD-10-CM

## 2023-01-21 ENCOUNTER — Other Ambulatory Visit (HOSPITAL_COMMUNITY): Payer: Self-pay

## 2023-01-21 ENCOUNTER — Telehealth: Payer: Self-pay | Admitting: Internal Medicine

## 2023-01-21 ENCOUNTER — Other Ambulatory Visit: Payer: Self-pay

## 2023-01-21 DIAGNOSIS — J4550 Severe persistent asthma, uncomplicated: Secondary | ICD-10-CM

## 2023-01-21 MED ORDER — DUPIXENT 300 MG/2ML ~~LOC~~ SOSY
300.0000 mg | PREFILLED_SYRINGE | SUBCUTANEOUS | 2 refills | Status: DC
  Filled 2023-01-21: qty 4, 28d supply, fill #0
  Filled 2023-02-12: qty 4, 28d supply, fill #1
  Filled 2023-03-16: qty 4, 28d supply, fill #2

## 2023-01-21 NOTE — Telephone Encounter (Addendum)
Refill sent for [place] to [facility]: [id number]   Dose: 300 mg  SQ every 2 weeks  Last OV: 04/18/2022 Provider: Dr. Shearon Stalls  Next OV: overdue, needs to be seen by APP or other MD  Knox Saliva, PharmD, MPH, BCPS Clinical Pharmacist (Rheumatology and Pulmonology)

## 2023-01-22 NOTE — Telephone Encounter (Signed)
Left voicemail for patient to call back to schedule follow up with APP or Dr. Shearon Stalls in March.

## 2023-01-27 ENCOUNTER — Ambulatory Visit (INDEPENDENT_AMBULATORY_CARE_PROVIDER_SITE_OTHER): Payer: Medicare HMO

## 2023-01-27 VITALS — BP 101/63 | HR 63 | Temp 97.9°F | Resp 14 | Ht 62.0 in | Wt 121.2 lb

## 2023-01-27 DIAGNOSIS — J4550 Severe persistent asthma, uncomplicated: Secondary | ICD-10-CM | POA: Diagnosis not present

## 2023-01-27 MED ORDER — DUPILUMAB 300 MG/2ML ~~LOC~~ SOSY
300.0000 mg | PREFILLED_SYRINGE | Freq: Once | SUBCUTANEOUS | Status: AC
  Administered 2023-01-27: 300 mg via SUBCUTANEOUS
  Filled 2023-01-27: qty 2

## 2023-01-27 NOTE — Progress Notes (Signed)
Diagnosis: Asthma  Provider:  Marshell Garfinkel MD  Procedure: Injection  Dupixent (Dupilumab), Dose: 300 mg, Site: subcutaneous, Number of injections: 1  Post Care:  n/a  Discharge: Condition: Good, Destination: Home . AVS Declined  Performed by:  Adelina Mings, LPN

## 2023-01-28 ENCOUNTER — Ambulatory Visit: Payer: Medicare HMO | Admitting: Nurse Practitioner

## 2023-01-28 ENCOUNTER — Ambulatory Visit (INDEPENDENT_AMBULATORY_CARE_PROVIDER_SITE_OTHER): Payer: Medicare HMO | Admitting: Acute Care

## 2023-01-28 ENCOUNTER — Encounter: Payer: Self-pay | Admitting: Acute Care

## 2023-01-28 VITALS — BP 108/68 | HR 80 | Temp 98.2°F | Ht 60.0 in | Wt 120.8 lb

## 2023-01-28 DIAGNOSIS — B349 Viral infection, unspecified: Secondary | ICD-10-CM | POA: Diagnosis not present

## 2023-01-28 DIAGNOSIS — J45909 Unspecified asthma, uncomplicated: Secondary | ICD-10-CM

## 2023-01-28 MED ORDER — MONTELUKAST SODIUM 10 MG PO TABS: 10.0000 mg | ORAL_TABLET | Freq: Every day | ORAL | 11 refills | Status: DC

## 2023-01-28 NOTE — Patient Instructions (Addendum)
It is good to see you today. I  suspect you have had a viral illness.  Continue using Breztri BID, singulair, dupixent, flonase and albuterol as you have been doing, Avoid your asthma triggers.  Restart your Singulair. I have renewed your prescription.  Continue to rest and drink plenty of fluids.  Continue using Ipratropium nasal drops as you have been doing.  Follow up with Dr. Shearon Stalls in 3 months . ( 04/2023)  Call if you do not continue to get better.  Please contact office for sooner follow up if symptoms do not improve or worsen or seek emergency care

## 2023-01-28 NOTE — Progress Notes (Signed)
History of Present Illness Leslie Sanders is a 47 y.o. 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 On dupilumab. She has a history of childhood asthma which has become worse with age. She is followed by Dr. Shearon Stalls.   Current Regimen: Breztri BID, singulair, dupixent (since July 2021) flonase, albuterol Asthma Triggers: exertion, pollen, dust, fragrances, dogs Exacerbations in the last year: 1-2  History of hospitalization or intubation: never  Allergy Testing: serum IgE 01/2020 was 20 GERD: yes not on any therapy Allergic Rhinitis: yes and not well controlled  02/02/2023 Pt. Presents for follow up. She was last seen in the office May of 2023 for asthma flare. At that time she was referred to Pulmonary Rehab in [facility], and she was also referred to allergy. Per Dr. Mauricio Po note her symptoms at that time were  secondary to medication non adherence.. Last Dupixent injection was 01/27/2023.  She has been sick for 2 weeks . She wonders if it is a cold. She never had a fever. She has not tested for Covid, or flu. She does not appear to be in any distress today.  She is really here for an acute visit. She has a runny nose, she is tired and she had a headache that has self resolved. She did go to her PCP who prescribed ipitroprium saline  which has helped her headache. She is using her Judithann Sauger daily She has been using her albuterol daily for shortness of breath.She denies any wheezing.  She has not been taking her Singulair. I have renewed this for her today.  I suspect she has had a viral illness that she is recovering from, as this has been ongoing for 2 weeks .   Test Results:      Latest Ref Rng & Units 05/09/2022    5:05 PM 01/25/2021    2:00 PM 03/14/2020   11:38 AM  CBC  WBC 4.0 - 10.5 K/uL 5.8  5.5  6.1   Hemoglobin 12.0 - 15.0 g/dL 13.3  12.0  10.9   Hematocrit 36.0 - 46.0 % 39.2  37.8  33.1   Platelets 150 - 400 K/uL 214  412  365.0        Latest Ref Rng & Units  05/09/2022    5:05 PM 01/25/2021    2:00 PM 09/05/2020   12:43 PM  BMP  Glucose 70 - 99 mg/dL 142  90  117   BUN 6 - 20 mg/dL '7  7  7   '$ Creatinine 0.44 - 1.00 mg/dL 0.63  0.52  0.79   BUN/Creat Ratio 9 - 23   9   Sodium 135 - 145 mmol/L 138  144  138   Potassium 3.5 - 5.1 mmol/L 3.3  3.7  4.4   Chloride 98 - 111 mmol/L 105  108  98   CO2 22 - 32 mmol/L '26  24  23   '$ Calcium 8.9 - 10.3 mg/dL 8.8  8.3  10.1     BNP No results found for: "BNP"  ProBNP    Component Value Date/Time   PROBNP 60 12/20/2019 1555    PFT    Component Value Date/Time   FEV1PRE 1.43 05/28/2020 1224   FEV1POST 1.69 05/28/2020 1224   FVCPRE 2.27 05/28/2020 1224   FVCPOST 2.41 05/28/2020 1224   TLC 3.94 05/28/2020 1224   DLCOUNC 16.47 05/28/2020 1224   PREFEV1FVCRT 63 05/28/2020 1224   PSTFEV1FVCRT 70 05/28/2020 1224    No results found.  Past medical hx Past Medical History:  Diagnosis Date   Anemia    (a) chronic blood loss hx of menorrhagia s/p endometrial biopsy 04/2015 (b) iron deficient   Anxiety    B12 deficiency    Bipolar 1 disorder ([place] [facility])    Cervical spinal stenosis    Cervical spondylosis without myelopathy 07/08/2016   Chronic pain of right thumb 04/28/2018   Depression    GERD (gastroesophageal reflux disease)    Hemorrhoids    Pt with constipation secondary to iron supplement. Pt has had rectal bleed secondary to internal hemorrhoids. 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   Migraine    PONV (postoperative nausea and vomiting)    PTSD (post-traumatic stress disorder)    Seasonal allergies    Vitamin E deficiency    Zinc deficiency      Social History   Tobacco Use   Smoking status: Former    Packs/day: 1.00    Years: 15.00    Total pack years: 15.00    Types: Cigarettes    Quit date: 12/08/2018    Years since quitting: 4.1   Smokeless tobacco: Never  Vaping Use   Vaping Use: Never used  Substance Use Topics   Alcohol use:  Not Currently    Alcohol/week: 28.0 standard drinks of alcohol    Types: 28 Cans of beer per week    Comment: Nothing in 4 years   Drug use: No    Ms.Bruss reports that she quit smoking about 4 years ago. Her smoking use included cigarettes. She has a 15.00 pack-year smoking history. She has never used smokeless tobacco. She reports that she does not currently use alcohol after a past usage of about 28.0 standard drinks of alcohol per week. She reports that she does not use drugs.  Tobacco Cessation: Former smoker , Quit 2020 with a 15 pack year smoking history   Past surgical hx, Family hx, Social hx all reviewed.  Current Outpatient Medications on File Prior to Visit  Medication Sig   albuterol (PROVENTIL) (2.5 MG/3ML) 0.083% nebulizer solution USE 1 VIAL VIA NEBULIZER EVERY 6 HOURS AS NEEDED FOR WHEEZING OR SHORTNESS OF BREATH   albuterol (VENTOLIN HFA) 108 (90 Base) MCG/ACT inhaler INHALE 1 PUFF INTO THE LUNGS EVERY 6 HOURS AS NEEDED FOR WHEEZING OR SHORTNESS OF BREATH   azelastine (ASTELIN) 0.1 % nasal spray Place 2 sprays into both nostrils 2 (two) times daily. Use in each nostril as directed (Patient taking differently: Place 2 sprays into both nostrils 2 (two) times daily.)   azelastine (ASTELIN) 0.1 % nasal spray Place 2 sprays into both nostrils 2 (two) times daily. Use in each nostril as directed   Budeson-Glycopyrrol-Formoterol (BREZTRI AEROSPHERE) 160-9-4.8 MCG/ACT AERO Inhale 2 puffs into the lungs 2 (two) times daily.   Carbinoxamine Maleate 4 MG TABS 4-8 mg up to twice daily as needed. May cause drowsiness.  Try during a time when you are at home and don't need to leave to see if you tolerate this.  If not, can use at night only.   COVID-19 mRNA bivalent vaccine, Pfizer, (PFIZER COVID-19 VAC BIVALENT) injection Inject into the muscle.   COVID-19 mRNA bivalent vaccine, Pfizer, injection Inject into the muscle.   cyanocobalamin (,VITAMIN B-12,) 1000 MCG/ML injection Inject 1 mL  into the muscle every 30 (thirty) days.   docusate sodium (COLACE) 100 MG capsule Take 100 mg by mouth daily.   DULoxetine (CYMBALTA) 60 MG  capsule Take 60 mg by mouth daily.    dupilumab (DUPIXENT) 300 MG/2ML prefilled syringe Inject 300 mg into the skin every 14 (fourteen) days.   ferrous sulfate 325 (65 FE) MG tablet Take 1 tablet (325 mg total) by mouth 2 (two) times daily with a meal.   fluticasone (FLONASE) 50 MCG/ACT nasal spray Place 2 sprays into both nostrils daily.   fluticasone (FLONASE) 50 MCG/ACT nasal spray Place 2 sprays into both nostrils daily.   folic acid (FOLVITE) 1 MG tablet Take 1 mg by mouth daily.    glycopyrrolate (ROBINUL) 2 MG tablet Take 1 tablet (2 mg total) by mouth 2 (two) times daily.   hydrocortisone 2.5 % ointment Apply topically twice daily as need to red sandpapery rash.   hydrOXYzine (VISTARIL) 50 MG capsule Take 50 mg by mouth every 8 (eight) hours as needed for anxiety or itching.   ipratropium (ATROVENT) 0.03 % nasal spray Place 2 sprays into both nostrils 3 (three) times daily as needed for rhinitis.   iron dextran complex 2,000 mg in sodium chloride 0.9 % 1,000 mL Inject 2,000 mg into the vein once. Monthly for 6 months   levocetirizine (XYZAL) 5 MG tablet Take 1 tablet (5 mg total) by mouth every evening.   Lidocaine 1.8 % PTCH Apply topically.   loratadine (CLARITIN) 10 MG tablet Take 1 tablet (10 mg total) by mouth daily.   midodrine (PROAMATINE) 5 MG tablet Take 1 tablet (5 mg total) by mouth 3 (three) times daily with meals.   montelukast (SINGULAIR) 10 MG tablet Take 1 tablet (10 mg total) by mouth daily as needed.   Olopatadine-Mometasone (RYALTRIS) [id number] MCG/ACT SUSP Place 2 sprays into the nose 2 (two) times daily as needed.   omeprazole (PRILOSEC) 40 MG capsule TAKE 1 CAPSULE(40 MG) BY MOUTH DAILY   pantoprazole (PROTONIX) 40 MG tablet Take 1 tablet (40 mg total) by mouth daily.   pregabalin (LYRICA) 200 MG capsule Take 200 mg by mouth 2  (two) times daily.   valACYclovir (VALTREX) 1000 MG tablet Take 1,000 mg by mouth 2 (two) times daily.   Vitamin D, Ergocalciferol, (DRISDOL) 1.25 MG (50000 UT) CAPS capsule Take 50,000 Units by mouth every Monday.    No current facility-administered medications on file prior to visit.     Allergies  Allergen Reactions   Tylenol [Acetaminophen] Anaphylaxis    Per patient   Adhesive  [Tape] Rash   Hydrocodone-Acetaminophen Other (See Comments) and Nausea And Vomiting   Nsaids Other (See Comments)    S/p bariatric surgery; increased risk of ulcers, gastritis and cancer   Latex Rash    Review Of Systems:  Constitutional:   No  weight loss, night sweats,  Fevers, chills, + fatigue, or  lassitude.  HEENT:   + headaches,  Difficulty swallowing,  Tooth/dental problems, or  Sore throat,                + sneezing, No itching, ear ache, + nasal congestion,+  post nasal drip,   CV:  No chest pain,  Orthopnea, PND, swelling in lower extremities, anasarca, dizziness, palpitations, syncope.   GI  No heartburn, indigestion, abdominal pain, nausea, vomiting, diarrhea, change in bowel habits, loss of appetite, bloody stools.   Resp: + shortness of breath with exertion less  at rest.  + excess mucus, no productive cough,  No non-productive cough,  No coughing up of blood.  No change in color of mucus.  No wheezing.  No chest wall  deformity  Skin: no rash or lesions.  GU: no dysuria, change in color of urine, no urgency or frequency.  No flank pain, no hematuria   MS:  No joint pain or swelling.  No decreased range of motion.  No back pain.  Psych:  No change in mood or affect. No depression or anxiety.  No memory loss.   Vital Signs BP 108/68 (BP Location: Right Arm, Patient Position: Sitting, Cuff Size: Normal)   Pulse 80   Temp 98.2 F (36.8 C) (Oral)   Ht 5' (1.524 m)   Wt 120 lb 12.8 oz (54.8 kg)   LMP 10/07/2016   SpO2 100%   BMI 23.59 kg/m    Physical Exam:  General- No  distress,  A&Ox3, pleasant  ENT: No sinus tenderness, TM clear, edematous  nasal mucosa, no oral exudate,+ post nasal drip, no LAN Cardiac: S1, S2, regular rate and rhythm, no murmur Chest: No wheeze/ rales/ dullness; no accessory muscle use, no nasal flaring, no sternal retractions Abd.: Soft Non-tender, ND, BS +, Body mass index is 23.59 kg/m.  Ext: No clubbing cyanosis, edema Neuro:  normal strength, MAE x 4, A&O  x 3 Skin: No rashes, warm and dry, No lesions  Psych: normal mood and behavior, flat affect   Assessment/Plan Viral trigger to asthma flare No wheezing Plan I  suspect you have had a viral illness.  Continue using Breztri BID, singulair, dupixent, flonase and albuterol as you have been doing, Avoid your asthma triggers.  Restart your Singulair. I have renewed your prescription.  Continue to rest and drink plenty of fluids.  Continue using Ipratropium nasal drops as you have been doing.  Follow up with Dr. Shearon Stalls in 3 months . ( 04/2023)  Call if you do not continue to get better.  Please contact office for sooner follow up if symptoms do not improve or worsen or seek emergency care      I spent 40 minutes dedicated to the care of this patient on the date of this encounter to include pre-visit review of records, face-to-face time with the patient discussing conditions above, post visit ordering of testing, clinical documentation with the electronic health record, making appropriate referrals as documented, and communicating necessary information to the patient's healthcare team.   Magdalen Spatz, NP 02/02/2023  2:08 PM

## 2023-02-02 ENCOUNTER — Encounter: Payer: Self-pay | Admitting: Acute Care

## 2023-02-10 ENCOUNTER — Ambulatory Visit (INDEPENDENT_AMBULATORY_CARE_PROVIDER_SITE_OTHER): Payer: Medicare HMO

## 2023-02-10 VITALS — BP 103/68 | HR 84 | Temp 98.0°F | Resp 12 | Ht 60.0 in | Wt 121.4 lb

## 2023-02-10 DIAGNOSIS — J4550 Severe persistent asthma, uncomplicated: Secondary | ICD-10-CM | POA: Diagnosis not present

## 2023-02-10 MED ORDER — DUPILUMAB 300 MG/2ML ~~LOC~~ SOSY
300.0000 mg | PREFILLED_SYRINGE | Freq: Once | SUBCUTANEOUS | Status: AC
  Administered 2023-02-10: 300 mg via SUBCUTANEOUS
  Filled 2023-02-10: qty 2

## 2023-02-10 NOTE — Progress Notes (Signed)
Diagnosis: Asthma  Provider:  Marshell Garfinkel MD  Procedure: Injection  Dupixent (Dupilumab), Dose: 300 mg, Site: subcutaneous, Number of injections: 1  Post Care: Patient declined observation  Discharge: Condition: Good, Destination: Home . AVS Declined  Performed by:  Binnie Kand, RN

## 2023-02-12 ENCOUNTER — Other Ambulatory Visit (HOSPITAL_COMMUNITY): Payer: Self-pay

## 2023-02-17 ENCOUNTER — Other Ambulatory Visit (HOSPITAL_COMMUNITY): Payer: Self-pay

## 2023-02-24 ENCOUNTER — Ambulatory Visit (INDEPENDENT_AMBULATORY_CARE_PROVIDER_SITE_OTHER): Payer: Medicare HMO

## 2023-02-24 VITALS — BP 121/77 | HR 62 | Temp 98.0°F | Resp 16 | Ht 62.0 in | Wt 119.0 lb

## 2023-02-24 DIAGNOSIS — J4550 Severe persistent asthma, uncomplicated: Secondary | ICD-10-CM

## 2023-02-24 MED ORDER — DUPILUMAB 300 MG/2ML ~~LOC~~ SOSY
300.0000 mg | PREFILLED_SYRINGE | Freq: Once | SUBCUTANEOUS | Status: AC
  Administered 2023-02-24: 300 mg via SUBCUTANEOUS

## 2023-02-24 NOTE — Progress Notes (Signed)
Diagnosis: Asthma  Provider:  Marshell Garfinkel MD  Procedure: Injection  Dupixent (Dupilumab), Dose: 300 mg, Site: subcutaneous, Number of injections: 1  Post Care:  Observation period noted needed   Has had medication before    Discharge: Condition: Good, Destination: Home . AVS Declined  Performed by:  Fraser Din Pilkington-Burchett, RN

## 2023-03-08 ENCOUNTER — Other Ambulatory Visit: Payer: Self-pay | Admitting: Adult Health

## 2023-03-10 ENCOUNTER — Telehealth: Payer: Self-pay | Admitting: Acute Care

## 2023-03-10 ENCOUNTER — Other Ambulatory Visit: Payer: Self-pay

## 2023-03-10 ENCOUNTER — Ambulatory Visit (INDEPENDENT_AMBULATORY_CARE_PROVIDER_SITE_OTHER): Payer: Medicare HMO

## 2023-03-10 VITALS — BP 116/77 | HR 64 | Temp 97.4°F | Resp 18 | Ht 62.0 in | Wt 118.2 lb

## 2023-03-10 DIAGNOSIS — J4550 Severe persistent asthma, uncomplicated: Secondary | ICD-10-CM | POA: Diagnosis not present

## 2023-03-10 MED ORDER — ALBUTEROL SULFATE HFA 108 (90 BASE) MCG/ACT IN AERS: INHALATION_SPRAY | RESPIRATORY_TRACT | 6 refills | Status: AC

## 2023-03-10 MED ORDER — DUPILUMAB 300 MG/2ML ~~LOC~~ SOSY
300.0000 mg | PREFILLED_SYRINGE | Freq: Once | SUBCUTANEOUS | Status: AC
  Administered 2023-03-10: 300 mg via SUBCUTANEOUS
  Filled 2023-03-10: qty 2

## 2023-03-10 MED ORDER — BREZTRI AEROSPHERE 160-9-4.8 MCG/ACT IN AERO: 2.0000 | INHALATION_SPRAY | Freq: Two times a day (BID) | RESPIRATORY_TRACT | 6 refills | Status: DC

## 2023-03-10 NOTE — Progress Notes (Signed)
Diagnosis: Asthma  Provider:  Marshell Garfinkel MD  Procedure: Injection  Dupixent (Dupilumab), Dose: 300 mg, Site: subcutaneous, Number of injections: 1  Post Care:  Observation period not needed   Discharge: Condition: Good, Destination: Home . AVS Declined  Performed by:  Fraser Din Pilkington-Burchett, RN

## 2023-03-10 NOTE — Telephone Encounter (Signed)
Patient states needs refill for Albuterol inhaler. Pharmacy is [facility]. Patient phone number is [phone].

## 2023-03-10 NOTE — Telephone Encounter (Signed)
Refills for Breztri and Albuterol have been sent to patients pharmacy. nfn

## 2023-03-11 ENCOUNTER — Other Ambulatory Visit (HOSPITAL_COMMUNITY): Payer: Self-pay

## 2023-03-16 ENCOUNTER — Other Ambulatory Visit: Payer: Self-pay

## 2023-03-16 NOTE — Telephone Encounter (Signed)
Refill was sent on 03/10/23. Will close encounter.

## 2023-03-18 ENCOUNTER — Other Ambulatory Visit (HOSPITAL_COMMUNITY): Payer: Self-pay

## 2023-03-24 ENCOUNTER — Ambulatory Visit: Payer: Medicare HMO

## 2023-03-24 MED ORDER — DUPILUMAB 300 MG/2ML ~~LOC~~ SOSY: 300.0000 mg | PREFILLED_SYRINGE | Freq: Once | SUBCUTANEOUS | Status: DC

## 2023-03-25 ENCOUNTER — Ambulatory Visit (INDEPENDENT_AMBULATORY_CARE_PROVIDER_SITE_OTHER): Payer: Medicare HMO

## 2023-03-25 VITALS — BP 115/76 | HR 78 | Temp 97.5°F | Resp 18 | Ht 62.0 in | Wt 115.8 lb

## 2023-03-25 DIAGNOSIS — J4550 Severe persistent asthma, uncomplicated: Secondary | ICD-10-CM | POA: Diagnosis not present

## 2023-03-25 MED ORDER — DUPILUMAB 300 MG/2ML ~~LOC~~ SOSY
300.0000 mg | PREFILLED_SYRINGE | Freq: Once | SUBCUTANEOUS | Status: AC
  Administered 2023-03-25: 300 mg via SUBCUTANEOUS

## 2023-03-25 NOTE — Progress Notes (Addendum)
Diagnosis: Asthma  Provider:  Chilton Greathouse MD  Procedure: Injection  Dupixent (Dupilumab), Dose: 300 mg, Site: subcutaneous, Number of injections: 1  Post Care:     Discharge: Condition: Good, Destination: Home . AVS Declined  Performed by:  Garnette Czech, RN

## 2023-03-26 ENCOUNTER — Encounter: Payer: Self-pay | Admitting: Internal Medicine

## 2023-04-08 ENCOUNTER — Ambulatory Visit (INDEPENDENT_AMBULATORY_CARE_PROVIDER_SITE_OTHER): Payer: Medicare HMO

## 2023-04-08 ENCOUNTER — Other Ambulatory Visit (HOSPITAL_COMMUNITY): Payer: Self-pay

## 2023-04-08 VITALS — BP 111/80 | HR 66 | Temp 98.8°F | Resp 18 | Ht 62.0 in | Wt 116.4 lb

## 2023-04-08 DIAGNOSIS — J4550 Severe persistent asthma, uncomplicated: Secondary | ICD-10-CM | POA: Diagnosis not present

## 2023-04-08 MED ORDER — DUPILUMAB 300 MG/2ML ~~LOC~~ SOSY
300.0000 mg | PREFILLED_SYRINGE | Freq: Once | SUBCUTANEOUS | Status: AC
  Administered 2023-04-08: 300 mg via SUBCUTANEOUS
  Filled 2023-04-08: qty 2

## 2023-04-08 NOTE — Progress Notes (Signed)
Diagnosis: Asthma  Provider:  Chilton Greathouse MD  Procedure: Injection  Dupixent (Dupilumab), Dose: 300 mg, Site: subcutaneous, Number of injections: 1  Post Care: Patient declined observation  Discharge: Condition: Good, Destination: Home . AVS Declined  Performed by:  Adriana Mccallum, RN

## 2023-04-13 ENCOUNTER — Telehealth: Payer: Self-pay | Admitting: Internal Medicine

## 2023-04-13 ENCOUNTER — Other Ambulatory Visit (HOSPITAL_COMMUNITY): Payer: Self-pay

## 2023-04-13 DIAGNOSIS — J4550 Severe persistent asthma, uncomplicated: Secondary | ICD-10-CM

## 2023-04-14 ENCOUNTER — Other Ambulatory Visit: Payer: Self-pay

## 2023-04-14 ENCOUNTER — Other Ambulatory Visit (HOSPITAL_COMMUNITY): Payer: Self-pay

## 2023-04-14 MED ORDER — DUPIXENT 300 MG/2ML ~~LOC~~ SOSY
300.0000 mg | PREFILLED_SYRINGE | SUBCUTANEOUS | 2 refills | Status: DC
Start: 2023-04-14 — End: 2023-07-01
  Filled 2023-04-14: qty 4, 28d supply, fill #0
  Filled 2023-05-12: qty 4, 28d supply, fill #1
  Filled 2023-06-09: qty 4, 28d supply, fill #2

## 2023-04-14 NOTE — Telephone Encounter (Signed)
Refill sent for DUPIXENT to [facility]: [phone]   Dose: 300 mg SQ every 2 weeks  Last OV: 221/24 Provider: Dr. Celine Mans  Next OV: due and not schedu;led  Chesley Mires, PharmD, MPH, BCPS Clinical Pharmacist (Rheumatology and Pulmonology)

## 2023-04-16 ENCOUNTER — Ambulatory Visit (INDEPENDENT_AMBULATORY_CARE_PROVIDER_SITE_OTHER): Payer: Medicare HMO | Admitting: Internal Medicine

## 2023-04-16 ENCOUNTER — Encounter: Payer: Self-pay | Admitting: Internal Medicine

## 2023-04-16 VITALS — BP 118/70 | HR 60 | Temp 98.7°F | Ht 62.0 in | Wt 115.4 lb

## 2023-04-16 DIAGNOSIS — J45909 Unspecified asthma, uncomplicated: Secondary | ICD-10-CM

## 2023-04-16 DIAGNOSIS — J301 Allergic rhinitis due to pollen: Secondary | ICD-10-CM

## 2023-04-16 MED ORDER — CETIRIZINE HCL 10 MG PO TABS
10.0000 mg | ORAL_TABLET | Freq: Every day | ORAL | 11 refills | Status: DC
Start: 2023-04-16 — End: 2023-12-18

## 2023-04-16 MED ORDER — MONTELUKAST SODIUM 10 MG PO TABS
10.0000 mg | ORAL_TABLET | Freq: Every day | ORAL | 11 refills | Status: AC
Start: 2023-04-16 — End: ?

## 2023-04-16 NOTE — Patient Instructions (Addendum)
Please schedule follow up scheduled with myself in 6 months.  If my schedule is not open yet, we will contact you with a reminder closer to that time. Please call [phone] if you haven't heard from [place] a month before.   For asthma: continue breztri, continue albuterol prn. continue dupilumab injections  For runny nose: Continue flonase and ipratropium nasal sprays.  Start taking zyrtec and singulair daily (sent to your pharmacy today.)

## 2023-04-16 NOTE — Progress Notes (Signed)
Leslie Sanders    [id number]    June 17, 1976  Primary Care Physician:Mannam, Colbert Coyer, MD Date of Appointment: 04/16/2023 Established Patient Visit  Chief complaint:   Chief Complaint  Patient presents with   Follow-up    Nose running      HPI: Leslie Sanders is a 47 y.o. 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 On dupilumab. She has a history of childhood asthma. Gotten worse as an adult. Last felt well controlled as a teenager.   Interval Updates: Here for follow up for asthma. Saw Jefm Bryant in Feb, was told to resume singulair for worsening asthma symptoms. Still having ongoing rhinitis.   She is not taking singulair or zyrtec.   She did not do pulmonary rehab. Feels her asthma feels well controlled.   Current Regimen: Breztri BID, singulair, dupixent (since July 2021) flonase, albuterol Asthma Triggers: exertion, pollen, dust, fragrances, dogs Exacerbations in the last year: none in the last year.  History of hospitalization or intubation: never  Allergy Testing: serum IgE 01/2020 was 20 GERD: yes not on any therapy Allergic Rhinitis: yes and not well controlled ACT:  Asthma Control Test ACT Total Score  04/16/2023 10:14 AM 20  04/18/2022 10:43 AM 14  12/19/2021 10:52 AM 14   FeNO: never had  I have reviewed the patient's family social and past medical history and updated as appropriate.   Past Medical History:  Diagnosis Date   Anemia    (a) chronic blood loss hx of menorrhagia s/p endometrial biopsy 04/2015 (b) iron deficient   Anxiety    B12 deficiency    Bipolar 1 disorder ([facility])    Cervical spinal stenosis    Cervical spondylosis without myelopathy 07/08/2016   Chronic pain of right thumb 04/28/2018   Depression    GERD (gastroesophageal reflux disease)    Hemorrhoids    Pt with constipation secondary to iron supplement. Pt has had rectal bleed secondary to internal hemorrhoids. Pt is s/p EGD/colonoscopy 05/2012   History of  gastric bypass 2003   Roux-en-Y   Migraine    PONV (postoperative nausea and vomiting)    PTSD (post-traumatic stress disorder)    Seasonal allergies    Vitamin E deficiency    Zinc deficiency     Past Surgical History:  Procedure Laterality Date   ABDOMINAL HYSTERECTOMY     BREAST LUMPECTOMY Bilateral    BREAST REDUCTION SURGERY     Carpal Metacarpal Arthroplasty Right 05/13/2019   CHOLECYSTECTOMY  1990   HAND SURGERY     HYSTERECTOMY ABDOMINAL WITH SALPINGECTOMY  11/2017   POSTERIOR CERVICAL LAMINECTOMY Bilateral 05/23/2019   RIGHT/LEFT HEART CATH AND CORONARY ANGIOGRAPHY N/A 01/13/2020   Procedure: RIGHT/LEFT HEART CATH AND CORONARY ANGIOGRAPHY;  Surgeon: Lyn Records, MD;  Location: [facility];  Service: Cardiovascular;  Laterality: N/A;   ROUX-EN-Y GASTRIC BYPASS  2003   in [place]   SPINAL FUSION  05/23/2019    Family History  Problem Relation Age of Onset   Schizophrenia Maternal Aunt    ADD / ADHD Son    ODD Son    Asthma Son    Breast cancer Mother    Arthritis/Rheumatoid Mother    Lupus Mother    HIV Father    Rheum arthritis Maternal Grandmother    COPD Maternal Grandmother    COPD Paternal Grandmother     Social History   Occupational History   Not on file  Tobacco Use   Smoking status: Former    Packs/day: 1.00    Years: 15.00    Additional pack years: 0.00    Total pack years: 15.00    Types: Cigarettes    Quit date: 12/08/2018    Years since quitting: 4.3   Smokeless tobacco: Never  Vaping Use   Vaping Use: Never used  Substance and Sexual Activity   Alcohol use: Not Currently    Alcohol/week: 28.0 standard drinks of alcohol    Types: 28 Cans of beer per week    Comment: Nothing in 4 years   Drug use: No   Sexual activity: Not Currently    Physical Exam: Blood pressure 118/70, pulse 60, temperature 98.7 F (37.1 C), temperature source Oral, height 5\' 2"  (1.575 m), weight 115 lb 6.4 oz (52.3 kg), last menstrual period 10/07/2016,  SpO2 99 %.  Gen:     No distress ENT: rhinorrhea, no polyps Lungs:   ctab no wheezes or crackles CV:         RRR no mrg   Data Reviewed: Imaging: I have personally reviewed the chest xray 01/2021 which shows no acute pulmonary process.  Sniff test 11/2019 shows normal diarphgram movement  PFTs:      Latest Ref Rng & Units 05/28/2020   12:24 PM 03/14/2020   10:00 AM 03/14/2020    9:13 AM 11/25/2019    3:11 PM  PFT Results  FVC-Pre L 2.27  2.11  2.11  2.10   FVC-Predicted Pre % 81  75  75  75   FVC-Post L 2.41  2.35  2.35  2.24   FVC-Predicted Post % 86  84  84  80   Pre FEV1/FVC % % 63  66  66  68   Post FEV1/FCV % % 70  74  74  75   FEV1-Pre L 1.43  1.38  1.38  1.42   FEV1-Predicted Pre % 62  60  60  62   FEV1-Post L 1.69  1.73  1.73  1.67   DLCO uncorrected ml/min/mmHg 16.47  16.53  16.53  14.36   DLCO UNC% % 81  82  82  71   DLCO corrected ml/min/mmHg 16.47  16.53  16.53    DLCO COR %Predicted % 81  82  82    DLVA Predicted % 106  103  103  89   TLC L 3.94  4.08  4.08  4.03   TLC % Predicted % 82  85  85  84   RV % Predicted % 113  119  119  123    I have personally reviewed the patient's PFTs and and they show moderate airflow limitation with a positive BD response.   Labs:  Immunization status: Immunization History  Administered Date(s) Administered   Influenza Split 12/21/2017, 10/13/2018, 08/31/2019   Influenza,inj,Quad PF,6+ Mos 12/21/2017, 08/31/2019, 08/20/2020, 08/28/2021, 09/23/2022   Influenza,inj,Quad PF,6-35 Mos 08/20/2020   Influenza-Unspecified 08/23/2015, 12/21/2017, 10/08/2018, 08/31/2019, 08/20/2020   MMR 07/24/2015, 09/06/2015   PFIZER Comirnaty(Gray Top)Covid-19 Tri-Sucrose Vaccine 03/02/2020, 03/27/2020, 07/31/2020   PFIZER(Purple Top)SARS-COV-2 Vaccination 03/02/2020, 03/27/2020, 07/31/2020   PPD Test 03/14/2016   Pfizer Covid-19 Vaccine Bivalent Booster 44yrs & up 09/13/2021, 04/21/2022   Pneumococcal Conjugate-13 10/31/2019   Pneumococcal  Polysaccharide-23 12/21/2017   Td (Adult),5 Lf Tetanus Toxid, Preservative Free 09/06/2015, 03/10/2016   Tdap 07/24/2015    Assessment:  Severe persistent asthma copd overlap syndrome, controlled on dupixent. Chronic allergic rhinitis, not well controlled  Plan/Recommendations:  For asthma: continue breztri, continue albuterol prn. continue dupilumab injections  For rhinitis Continue flonase and ipratropium nasal sprays.  Start taking zyrtec and singulair daily (sent to your pharmacy today.)   Return to Care: Return in about 6 months (around 10/17/2023).   Durel Salts, MD Pulmonary and Critical Care Medicine [facility] [phone]

## 2023-04-22 ENCOUNTER — Ambulatory Visit (INDEPENDENT_AMBULATORY_CARE_PROVIDER_SITE_OTHER): Payer: Medicare HMO

## 2023-04-22 VITALS — BP 103/73 | HR 57 | Temp 98.0°F | Resp 16 | Ht 62.0 in | Wt 117.2 lb

## 2023-04-22 DIAGNOSIS — J4550 Severe persistent asthma, uncomplicated: Secondary | ICD-10-CM | POA: Diagnosis not present

## 2023-04-22 MED ORDER — DUPILUMAB 300 MG/2ML ~~LOC~~ SOSY
300.0000 mg | PREFILLED_SYRINGE | Freq: Once | SUBCUTANEOUS | Status: AC
  Administered 2023-04-22: 300 mg via SUBCUTANEOUS
  Filled 2023-04-22: qty 2

## 2023-04-22 NOTE — Progress Notes (Signed)
Diagnosis: Asthma  Provider:  Mannam, Praveen MD  Procedure: Injection  Dupixent (Dupilumab), Dose: 300 mg, Site: subcutaneous, Number of injections: 1  Post Care:  n/a  Discharge: Condition: Good, Destination: Home . AVS Declined  Performed by:  Joelle Roswell E Brennah Quraishi, LPN       

## 2023-05-06 ENCOUNTER — Ambulatory Visit (INDEPENDENT_AMBULATORY_CARE_PROVIDER_SITE_OTHER): Payer: Medicare HMO

## 2023-05-06 VITALS — BP 107/70 | HR 62 | Temp 98.2°F | Resp 16 | Ht 62.0 in | Wt 119.0 lb

## 2023-05-06 DIAGNOSIS — J4550 Severe persistent asthma, uncomplicated: Secondary | ICD-10-CM

## 2023-05-06 MED ORDER — DUPILUMAB 300 MG/2ML ~~LOC~~ SOSY
300.0000 mg | PREFILLED_SYRINGE | Freq: Once | SUBCUTANEOUS | Status: AC
  Administered 2023-05-06: 300 mg via SUBCUTANEOUS
  Filled 2023-05-06: qty 2

## 2023-05-06 NOTE — Progress Notes (Signed)
Diagnosis: Asthma  Provider:  Chilton Greathouse MD  Procedure: Injection  Dupixent (Dupilumab), Dose: 300 mg, Site: subcutaneous, Number of injections: 1  Post Care:  Patient Discharged  Discharge: Condition: Good, Destination: Home . AVS Declined  Performed by:  Marilynn Rail, RN

## 2023-05-12 ENCOUNTER — Other Ambulatory Visit (HOSPITAL_COMMUNITY): Payer: Self-pay

## 2023-05-13 ENCOUNTER — Other Ambulatory Visit: Payer: Self-pay

## 2023-05-20 ENCOUNTER — Ambulatory Visit (INDEPENDENT_AMBULATORY_CARE_PROVIDER_SITE_OTHER): Payer: Medicare HMO

## 2023-05-20 VITALS — BP 109/72 | HR 78 | Temp 98.0°F | Resp 18 | Ht 62.0 in | Wt 116.0 lb

## 2023-05-20 DIAGNOSIS — J4550 Severe persistent asthma, uncomplicated: Secondary | ICD-10-CM | POA: Diagnosis not present

## 2023-05-20 MED ORDER — DUPILUMAB 300 MG/2ML ~~LOC~~ SOSY
300.0000 mg | PREFILLED_SYRINGE | Freq: Once | SUBCUTANEOUS | Status: AC
  Administered 2023-05-20: 300 mg via SUBCUTANEOUS
  Filled 2023-05-20: qty 2

## 2023-05-20 NOTE — Progress Notes (Signed)
Diagnosis: Asthma  Provider:  Mannam, Praveen MD  Procedure: Injection  Dupixent (Dupilumab), Dose: 300 mg, Site: subcutaneous, Number of injections: 1  Post Care: Patient declined observation  Discharge: Condition: Good, Destination: Home . AVS Declined  Performed by:  Tomie Elko, RN       

## 2023-06-03 ENCOUNTER — Ambulatory Visit (INDEPENDENT_AMBULATORY_CARE_PROVIDER_SITE_OTHER): Payer: Medicare HMO | Admitting: *Deleted

## 2023-06-03 VITALS — BP 105/68 | HR 77 | Temp 98.8°F | Resp 16 | Ht 62.0 in | Wt 115.4 lb

## 2023-06-03 DIAGNOSIS — J4550 Severe persistent asthma, uncomplicated: Secondary | ICD-10-CM | POA: Diagnosis not present

## 2023-06-03 MED ORDER — DUPILUMAB 300 MG/2ML ~~LOC~~ SOSY
300.0000 mg | PREFILLED_SYRINGE | Freq: Once | SUBCUTANEOUS | Status: AC
  Administered 2023-06-03: 300 mg via SUBCUTANEOUS

## 2023-06-03 NOTE — Progress Notes (Signed)
Diagnosis: Asthma  Provider:  Mannam, Praveen MD  Procedure: Injection  Dupixent (Dupilumab), Dose: 300 mg, Site: subcutaneous, Number of injections: 1  Post Care: Observation period completed  Discharge: Condition: Good, Destination: Home . AVS Declined  Performed by:  Johana Hopkinson A, RN       

## 2023-06-09 ENCOUNTER — Other Ambulatory Visit (HOSPITAL_COMMUNITY): Payer: Self-pay

## 2023-06-17 ENCOUNTER — Ambulatory Visit (INDEPENDENT_AMBULATORY_CARE_PROVIDER_SITE_OTHER): Payer: Medicare HMO

## 2023-06-17 VITALS — BP 114/74 | HR 61 | Temp 98.4°F | Resp 16 | Ht 62.0 in | Wt 112.6 lb

## 2023-06-17 DIAGNOSIS — J4550 Severe persistent asthma, uncomplicated: Secondary | ICD-10-CM

## 2023-06-17 MED ORDER — DUPILUMAB 300 MG/2ML ~~LOC~~ SOSY
300.0000 mg | PREFILLED_SYRINGE | Freq: Once | SUBCUTANEOUS | Status: AC
  Administered 2023-06-17: 300 mg via SUBCUTANEOUS
  Filled 2023-06-17: qty 2

## 2023-06-17 NOTE — Progress Notes (Signed)
Diagnosis: Asthma  Provider:  Chilton Greathouse MD  Procedure: Injection  Dupixent (Dupilumab), Dose: 300 mg, Site: subcutaneous, Number of injections: 1  Administered in right arm.  Post Care: Patient declined observation  Discharge: Condition: Stable, Destination: Home . AVS Declined  Performed by:  Wyvonne Lenz, RN

## 2023-07-01 ENCOUNTER — Ambulatory Visit (INDEPENDENT_AMBULATORY_CARE_PROVIDER_SITE_OTHER): Payer: Medicare HMO | Admitting: *Deleted

## 2023-07-01 ENCOUNTER — Other Ambulatory Visit (HOSPITAL_COMMUNITY): Payer: Self-pay

## 2023-07-01 ENCOUNTER — Other Ambulatory Visit: Payer: Self-pay | Admitting: Internal Medicine

## 2023-07-01 VITALS — BP 105/70 | HR 67 | Temp 97.8°F | Resp 16 | Ht 62.0 in | Wt 114.0 lb

## 2023-07-01 DIAGNOSIS — J4550 Severe persistent asthma, uncomplicated: Secondary | ICD-10-CM

## 2023-07-01 MED ORDER — DUPILUMAB 300 MG/2ML ~~LOC~~ SOSY
300.0000 mg | PREFILLED_SYRINGE | Freq: Once | SUBCUTANEOUS | Status: AC
  Administered 2023-07-01: 300 mg via SUBCUTANEOUS
  Filled 2023-07-01: qty 2

## 2023-07-01 NOTE — Progress Notes (Signed)
Diagnosis: Asthma  Provider:  Mannam, Praveen MD  Procedure: Injection  Dupixent (Dupilumab), Dose: 300 mg, Site: subcutaneous, Number of injections: 1  Post Care: Observation period completed  Discharge: Condition: Good, Destination: Home . AVS Declined  Performed by:  Williams, Kathryn A, RN       

## 2023-07-06 ENCOUNTER — Other Ambulatory Visit (HOSPITAL_COMMUNITY): Payer: Self-pay

## 2023-07-06 ENCOUNTER — Other Ambulatory Visit: Payer: Self-pay | Admitting: Internal Medicine

## 2023-07-06 DIAGNOSIS — J4550 Severe persistent asthma, uncomplicated: Secondary | ICD-10-CM

## 2023-07-08 ENCOUNTER — Other Ambulatory Visit: Payer: Self-pay

## 2023-07-08 ENCOUNTER — Other Ambulatory Visit (HOSPITAL_COMMUNITY): Payer: Self-pay

## 2023-07-08 MED ORDER — DUPIXENT 300 MG/2ML ~~LOC~~ SOSY
300.0000 mg | PREFILLED_SYRINGE | SUBCUTANEOUS | 3 refills | Status: DC
  Filled 2023-07-08: qty 4, 28d supply, fill #0
  Filled 2023-08-07: qty 4, 28d supply, fill #1
  Filled 2023-08-31: qty 4, 28d supply, fill #2
  Filled 2023-09-23: qty 4, 28d supply, fill #3

## 2023-07-08 NOTE — Telephone Encounter (Signed)
Refill sent for DUPIXENT to [facility]: [phone]   Dose: 300 mg SQ every 2 weeks  Last OV: 04/16/2023 Provider: Dr. Celine Mans  Next OV: 6 months (not yet scheduled)  Chesley Mires, PharmD, MPH, BCPS Clinical Pharmacist (Rheumatology and Pulmonology)

## 2023-07-15 ENCOUNTER — Ambulatory Visit (INDEPENDENT_AMBULATORY_CARE_PROVIDER_SITE_OTHER): Payer: Medicare HMO

## 2023-07-15 VITALS — BP 113/74 | HR 59 | Temp 97.9°F | Resp 17 | Ht 62.0 in | Wt 112.0 lb

## 2023-07-15 DIAGNOSIS — J4550 Severe persistent asthma, uncomplicated: Secondary | ICD-10-CM | POA: Diagnosis not present

## 2023-07-15 MED ORDER — DUPILUMAB 300 MG/2ML ~~LOC~~ SOSY
300.0000 mg | PREFILLED_SYRINGE | Freq: Once | SUBCUTANEOUS | Status: AC
  Administered 2023-07-15: 300 mg via SUBCUTANEOUS
  Filled 2023-07-15: qty 2

## 2023-07-15 NOTE — Progress Notes (Signed)
Diagnosis: Asthma  Provider:  Chilton Greathouse MD  Procedure: Injection  Dupixent (Dupilumab), Dose: 300 mg, Site: subcutaneous, Number of injections: 1  Post Care: Patient declined observation. Left arm injection.  Discharge: Condition: Good, Destination: Home . AVS Declined  Performed by:  Rico Ala, LPN

## 2023-07-29 ENCOUNTER — Ambulatory Visit (INDEPENDENT_AMBULATORY_CARE_PROVIDER_SITE_OTHER): Payer: Medicare HMO

## 2023-07-29 VITALS — BP 109/75 | HR 68 | Temp 97.9°F | Resp 16 | Ht 62.0 in | Wt 109.2 lb

## 2023-07-29 DIAGNOSIS — J4550 Severe persistent asthma, uncomplicated: Secondary | ICD-10-CM | POA: Diagnosis not present

## 2023-07-29 MED ORDER — DUPILUMAB 300 MG/2ML ~~LOC~~ SOSY
300.0000 mg | PREFILLED_SYRINGE | Freq: Once | SUBCUTANEOUS | Status: AC
  Administered 2023-07-29: 300 mg via SUBCUTANEOUS
  Filled 2023-07-29: qty 2

## 2023-07-29 NOTE — Progress Notes (Signed)
Diagnosis: Asthma  Provider:  Mannam, Praveen MD  Procedure: Injection  Dupixent (Dupilumab), Dose: 300 mg, Site: subcutaneous, Number of injections: 1  Post Care: Patient declined observation  Discharge: Condition: Good, Destination: Home . AVS Declined  Performed by:  Sunita Ranabhat, RN       

## 2023-08-04 ENCOUNTER — Other Ambulatory Visit (HOSPITAL_COMMUNITY): Payer: Self-pay

## 2023-08-07 ENCOUNTER — Other Ambulatory Visit (HOSPITAL_COMMUNITY): Payer: Self-pay

## 2023-08-07 ENCOUNTER — Other Ambulatory Visit: Payer: Self-pay

## 2023-08-12 ENCOUNTER — Ambulatory Visit: Payer: Medicare HMO

## 2023-08-12 MED ORDER — DUPILUMAB 300 MG/2ML ~~LOC~~ SOSY
300.0000 mg | PREFILLED_SYRINGE | Freq: Once | SUBCUTANEOUS | Status: DC
  Filled 2023-08-12: qty 2

## 2023-08-13 ENCOUNTER — Encounter: Payer: Self-pay | Admitting: Internal Medicine

## 2023-08-20 ENCOUNTER — Ambulatory Visit (INDEPENDENT_AMBULATORY_CARE_PROVIDER_SITE_OTHER): Payer: Medicare HMO | Admitting: *Deleted

## 2023-08-20 VITALS — BP 109/70 | HR 78 | Temp 97.7°F | Resp 16 | Ht 62.0 in | Wt 110.6 lb

## 2023-08-20 DIAGNOSIS — J4550 Severe persistent asthma, uncomplicated: Secondary | ICD-10-CM

## 2023-08-20 MED ORDER — DUPILUMAB 300 MG/2ML ~~LOC~~ SOSY
300.0000 mg | PREFILLED_SYRINGE | Freq: Once | SUBCUTANEOUS | Status: AC
  Administered 2023-08-20: 300 mg via SUBCUTANEOUS
  Filled 2023-08-20: qty 2

## 2023-08-20 NOTE — Progress Notes (Signed)
Diagnosis: Asthma  Provider:  Mannam, Praveen MD  Procedure: Injection  Dupixent (Dupilumab), Dose: 300 mg, Site: subcutaneous, Number of injections: 1  Post Care: Observation period completed  Discharge: Condition: Good, Destination: Home . AVS Declined  Performed by:  Williams, Kathryn A, RN       

## 2023-08-26 ENCOUNTER — Ambulatory Visit: Payer: Medicare HMO

## 2023-08-26 ENCOUNTER — Other Ambulatory Visit: Payer: Self-pay

## 2023-08-31 ENCOUNTER — Other Ambulatory Visit (HOSPITAL_COMMUNITY): Payer: Self-pay

## 2023-08-31 NOTE — Progress Notes (Signed)
Specialty Pharmacy Refill Coordination Note  NYYA DUSKIN is a 47 y.o. female contacted today regarding refills of specialty medication(s) Dupilumab .  Patient requested Courier to Provider Office  on 09/02/23  to verified address [facility] -[address]   Medication will be filled on 09/01/23.   Specialty Pharmacy Ongoing Clinical Assessment Note  ZIONAH VERTUCCI is a 47 y.o. female who is being followed by the specialty pharmacy service for RxSp Asthma/COPD   Review of patient's specialty medication(s) Dupilumab  occurred today.   Patient has missed 0  doses in the last 4 weeks.   Patient did not have any additional questions or concerns.   Therapeutic benefit summary: Patient is achieving benefit   Adverse events/side effects summary: No adverse events/side effects   Patient's therapy is appropriate to : Continue    Goals      Reduce signs and symptoms     Patient is on track . Patient will maintain adherence         Follow up:  6 months

## 2023-09-03 ENCOUNTER — Ambulatory Visit: Payer: Medicare HMO

## 2023-09-03 VITALS — BP 111/76 | HR 94 | Temp 98.1°F | Resp 16 | Ht 62.0 in | Wt 108.6 lb

## 2023-09-03 DIAGNOSIS — J4550 Severe persistent asthma, uncomplicated: Secondary | ICD-10-CM | POA: Diagnosis not present

## 2023-09-03 MED ORDER — DUPILUMAB 300 MG/2ML ~~LOC~~ SOSY
300.0000 mg | PREFILLED_SYRINGE | Freq: Once | SUBCUTANEOUS | Status: AC
  Administered 2023-09-03: 300 mg via SUBCUTANEOUS
  Filled 2023-09-03: qty 2

## 2023-09-03 NOTE — Progress Notes (Signed)
Diagnosis: Asthma  Provider:  Chilton Greathouse MD  Procedure: Injection  Dupixent (Dupilumab), Dose: 300 mg, Site: subcutaneous, Number of injections: 1  Post Care: Patient declined observation  Discharge: Condition: Good, Destination: Home . AVS Declined  Performed by:  Loney Hering, LPN

## 2023-09-17 ENCOUNTER — Ambulatory Visit: Payer: Medicare HMO

## 2023-09-17 VITALS — BP 122/75 | HR 80 | Temp 97.5°F | Resp 18 | Ht 62.0 in | Wt 114.4 lb

## 2023-09-17 DIAGNOSIS — J4550 Severe persistent asthma, uncomplicated: Secondary | ICD-10-CM

## 2023-09-17 MED ORDER — DUPILUMAB 300 MG/2ML ~~LOC~~ SOSY
300.0000 mg | PREFILLED_SYRINGE | Freq: Once | SUBCUTANEOUS | Status: AC
  Administered 2023-09-17: 300 mg via SUBCUTANEOUS
  Filled 2023-09-17: qty 2

## 2023-09-17 NOTE — Progress Notes (Signed)
Diagnosis: Asthma  Provider:  Chilton Greathouse MD  Procedure: IV Infusion  Dupixent (Dupilumab), Dose: 300 mg, Site: subcutaneous, Number of injections: 1  Post Care: Patient declined observation  Discharge: Condition: Good, Destination: Home . AVS Declined  Performed by:  Loney Hering, LPN

## 2023-09-18 ENCOUNTER — Other Ambulatory Visit: Payer: Self-pay

## 2023-09-18 ENCOUNTER — Emergency Department (HOSPITAL_BASED_OUTPATIENT_CLINIC_OR_DEPARTMENT_OTHER)
Admission: EM | Admit: 2023-09-18 | Discharge: 2023-09-18 | Disposition: A | Discharge status: Home / Self Care | Payer: Medicare HMO | Attending: Emergency Medicine | Admitting: Emergency Medicine

## 2023-09-18 ENCOUNTER — Encounter (HOSPITAL_BASED_OUTPATIENT_CLINIC_OR_DEPARTMENT_OTHER): Payer: Self-pay | Admitting: Pediatrics

## 2023-09-18 DIAGNOSIS — S8002XA Contusion of left knee, initial encounter: Secondary | ICD-10-CM | POA: Insufficient documentation

## 2023-09-18 DIAGNOSIS — Z91040 Latex allergy status: Secondary | ICD-10-CM | POA: Diagnosis not present

## 2023-09-18 DIAGNOSIS — M25462 Effusion, left knee: Secondary | ICD-10-CM

## 2023-09-18 DIAGNOSIS — W1840XA Slipping, tripping and stumbling without falling, unspecified, initial encounter: Secondary | ICD-10-CM | POA: Insufficient documentation

## 2023-09-18 DIAGNOSIS — S8992XA Unspecified injury of left lower leg, initial encounter: Secondary | ICD-10-CM | POA: Diagnosis present

## 2023-09-18 MED ORDER — KETOROLAC TROMETHAMINE 15 MG/ML IJ SOLN
15.0000 mg | Freq: Once | INTRAMUSCULAR | Status: AC
  Administered 2023-09-18: 15 mg via INTRAMUSCULAR
  Filled 2023-09-18: qty 1

## 2023-09-18 MED ORDER — CYCLOBENZAPRINE HCL 10 MG PO TABS
10.0000 mg | ORAL_TABLET | Freq: Two times a day (BID) | ORAL | 0 refills | Status: AC | PRN
Start: 2023-09-18 — End: ?

## 2023-09-18 NOTE — ED Provider Notes (Signed)
[place] [facility] Provider Note   CSN: [id number] Arrival date & time: 09/18/23  1547     History  Chief Complaint  Patient presents with   Knee Pain    Leslie Sanders is a 47 y.o. female.  The history is provided by the patient and medical records. No language interpreter was used.  Knee Pain    47 year old female significant history of fibromyalgia, anemia, depression, chronic alcohol use, B12 deficiency, history of gastric bypass presenting complaining of left knee pain.  Patient report a week ago she slipped and injured her left knee.  She is complaining of significant pain about the knee with associate swelling.  She mention she was seen at an urgent care center for her complaint and had an x-ray done but was normal.  She has been using Voltaren gel, heat and ice, rest but states that has not improved much.  She still endorsed swallowing to knee and having trouble bending her knee.  She is unsure of her knee locked up or if she hears any clicking sounds.  She does not complain of any hip or ankle pain.  She denies any numbness or weakness to her legs and denies any back pain.  Home Medications Prior to Admission medications   Medication Sig Start Date End Date Taking? Authorizing Provider  albuterol (PROVENTIL) (2.5 MG/3ML) 0.083% nebulizer solution USE 1 VIAL VIA NEBULIZER EVERY 6 HOURS AS NEEDED FOR WHEEZING OR SHORTNESS OF BREATH 01/09/23   Charlott Holler, MD  albuterol (VENTOLIN HFA) 108 (90 Base) MCG/ACT inhaler INHALE 1 PUFF INTO THE LUNGS EVERY 6 HOURS AS NEEDED FOR WHEEZING OR SHORTNESS OF BREATH 03/10/23   Charlott Holler, MD  azelastine (ASTELIN) 0.1 % nasal spray Place 2 sprays into both nostrils 2 (two) times daily. Use in each nostril as directed Patient taking differently: Place 2 sprays into both nostrils 2 (two) times daily. 08/15/20   Steffanie Dunn, DO  azelastine (ASTELIN) 0.1 % nasal spray Place 2 sprays into both nostrils 2  (two) times daily. Use in each nostril as directed 09/03/22   Verlee Monte, MD  Budeson-Glycopyrrol-Formoterol (BREZTRI AEROSPHERE) 160-9-4.8 MCG/ACT AERO Inhale 2 puffs into the lungs 2 (two) times daily. 03/10/23   Charlott Holler, MD  Carbinoxamine Maleate 4 MG TABS 4-8 mg up to twice daily as needed. May cause drowsiness.  Try during a time when you are at home and don't need to leave to see if you tolerate this.  If not, can use at night only. 05/15/22   Verlee Monte, MD  cetirizine (ZYRTEC) 10 MG tablet Take 1 tablet (10 mg total) by mouth daily. 04/16/23   Charlott Holler, MD  COVID-19 mRNA bivalent vaccine, Pfizer, (PFIZER COVID-19 VAC BIVALENT) injection Inject into the muscle. 04/21/22   Judyann Munson, MD  COVID-19 mRNA bivalent vaccine, Pfizer, injection Inject into the muscle. 09/13/21   Judyann Munson, MD  cyanocobalamin (,VITAMIN B-12,) 1000 MCG/ML injection Inject 1 mL into the muscle every 30 (thirty) days.    [provider]  docusate sodium (COLACE) 100 MG capsule Take 100 mg by mouth daily.    [provider]  DULoxetine (CYMBALTA) 60 MG capsule Take 60 mg by mouth daily.  03/10/19   [provider]  dupilumab (DUPIXENT) 300 MG/2ML prefilled syringe Inject 300 mg into the skin every 14 (fourteen) days. 07/08/23   Charlott Holler, MD  ferrous sulfate 325 (65 FE) MG tablet  Take 1 tablet (325 mg total) by mouth 2 (two) times daily with a meal. 12/15/16   Oneta Rack, NP  fluticasone (FLONASE) 50 MCG/ACT nasal spray Place 2 sprays into both nostrils daily. 12/19/21   Charlott Holler, MD  fluticasone (FLONASE) 50 MCG/ACT nasal spray Place 2 sprays into both nostrils daily. 09/03/22   Verlee Monte, MD  folic acid (FOLVITE) 1 MG tablet Take 1 mg by mouth daily.  04/07/19   [provider]  glycopyrrolate (ROBINUL) 2 MG tablet Take 1 tablet (2 mg total) by mouth 2 (two) times daily. 01/18/20   Esterwood, Amy S, PA-C  hydrocortisone 2.5 % ointment Apply  topically twice daily as need to red sandpapery rash. 08/15/22   Verlee Monte, MD  hydrOXYzine (VISTARIL) 50 MG capsule Take 50 mg by mouth every 8 (eight) hours as needed for anxiety or itching. 09/20/20   [provider]  ipratropium (ATROVENT) 0.03 % nasal spray Place 2 sprays into both nostrils 3 (three) times daily as needed for rhinitis. 08/15/22   Verlee Monte, MD  iron dextran complex 2,000 mg in sodium chloride 0.9 % 1,000 mL Inject 2,000 mg into the vein once. Monthly for 6 months    [provider]  Lidocaine 1.8 % PTCH Apply topically.    [provider]  midodrine (PROAMATINE) 5 MG tablet Take 1 tablet (5 mg total) by mouth 3 (three) times daily with meals. 06/14/20   Rosalio Macadamia, NP  montelukast (SINGULAIR) 10 MG tablet Take 1 tablet (10 mg total) by mouth at bedtime. 04/16/23   Charlott Holler, MD  Olopatadine-Mometasone Cristal Generous) [id number] MCG/ACT SUSP Place 2 sprays into the nose 2 (two) times daily as needed. 08/15/22   Verlee Monte, MD  omeprazole (PRILOSEC) 40 MG capsule TAKE 1 CAPSULE(40 MG) BY MOUTH DAILY 12/25/20   Esterwood, Amy S, PA-C  pantoprazole (PROTONIX) 40 MG tablet Take 1 tablet (40 mg total) by mouth daily. 01/11/20   Steffanie Dunn, DO  pregabalin (LYRICA) 200 MG capsule Take 200 mg by mouth 2 (two) times daily. 09/11/20   [provider]  valACYclovir (VALTREX) 1000 MG tablet Take 1,000 mg by mouth 2 (two) times daily.    [provider]  Vitamin D, Ergocalciferol, (DRISDOL) 1.25 MG (50000 UT) CAPS capsule Take 50,000 Units by mouth every Monday.  07/25/19   [provider]      Allergies    Tylenol [acetaminophen], Adhesive  [tape], Hydrocodone-acetaminophen, Nsaids, and Latex    Review of Systems   Review of Systems  All other systems reviewed and are negative.   Physical Exam Updated Vital Signs BP 110/86 (BP Location: Left Arm)   Pulse 71   Temp 98.4 F (36.9 C) (Oral)   Resp 18   Ht 5\' 2"  (1.575 m)    Wt 51.9 kg   LMP 10/07/2016   SpO2 100%   BMI 20.92 kg/m  Physical Exam Vitals and nursing note reviewed.  Constitutional:      General: She is not in acute distress.    Appearance: She is well-developed.  HENT:     Head: Atraumatic.  Eyes:     Conjunctiva/sclera: Conjunctivae normal.  Pulmonary:     Effort: Pulmonary effort is normal.  Musculoskeletal:        General: Tenderness (Left knee: Faint bruising noted to the suprapatellar region.  Edema noted to the knee with point tenderness to medial joint line.  No joint laxity  patella is located.  Able to flex and extend the knee.) present.     Cervical back: Neck supple.     Comments: No tenderness to left hip or left ankle.  Skin:    Findings: No rash.  Neurological:     Mental Status: She is alert.  Psychiatric:        Mood and Affect: Mood normal.     ED Results / Procedures / Treatments   Labs (all labs ordered are listed, but only abnormal results are displayed) Labs Reviewed - No data to display  EKG None  Radiology No results found.  Procedures Procedures    Medications Ordered in ED Medications  ketorolac (TORADOL) 15 MG/ML injection 15 mg (has no administration in time range)    ED Course/ Medical Decision Making/ A&P                                 Medical Decision Making Risk Prescription drug management.   BP 110/86 (BP Location: Left Arm)   Pulse 71   Temp 98.4 F (36.9 C) (Oral)   Resp 18   Ht 5\' 2"  (1.575 m)   Wt 51.9 kg   LMP 10/07/2016   SpO2 100%   BMI 20.92 kg/m   98:21 PM  47 year old female significant history of fibromyalgia, anemia, depression, chronic alcohol use, B12 deficiency, history of gastric bypass presenting complaining of left knee pain.  Patient report a week ago she slipped and injured her left knee.  She is complaining of significant pain about the knee with associate swelling.  She mention she was seen at an urgent care center for her complaint and had an  x-ray done but was normal.  She has been using Voltaren gel, heat and ice, rest but states that has not improved much.  She still endorsed swallowing to knee and having trouble bending her knee.  She is unsure of her knee locked up or if she hears any clicking sounds.  She does not complain of any hip or ankle pain.  She denies any numbness or weakness to her legs and denies any back pain.  Exam remarkable for swelling about the left knee with point tenderness to medial joint line.  No significant erythema or warmth.  No obvious deformity.  DDx: Strain, sprain, fracture, dislocation, internal derangement of the knee.  Suspect meniscal injury based on location of pain.  Low suspicion for infection causing her symptoms.  Will give patient Toradol shot, provide knee sleeve for support, and recommend patient to follow-up with orthopedist for outpatient care.  RICE therapy discussed.  X-ray considered but patient reports she has an x-ray done of the left knee recently and it was normal.        Final Clinical Impression(s) / ED Diagnoses Final diagnoses:  Effusion of left knee    Rx / DC Orders ED Discharge Orders          Ordered    cyclobenzaprine (FLEXERIL) 10 MG tablet  2 times daily PRN        09/18/23 1821              Fayrene Helper, PA-C 09/19/23 1518    Rondel Baton, MD 09/21/23 [phone]

## 2023-09-18 NOTE — ED Triage Notes (Signed)
"  Leg giving out" stated started 1 week ago; and had some normal xrays; no relief from pain gels, ice, warm compresses.

## 2023-09-18 NOTE — Discharge Instructions (Signed)
You have been evaluated for your knee injury.  I suspect you have a meniscal injury or a ligament injury of the knee.  Wear knee sleeve for support, keep your knee elevated while at rest, continue with your home medication including Voltaren gel, he denies, and you may take muscle relaxant for symptom control.  Please call and follow-up closely with orthopedic specialist for further care.

## 2023-09-23 ENCOUNTER — Other Ambulatory Visit: Payer: Self-pay

## 2023-09-23 NOTE — Progress Notes (Signed)
Specialty Pharmacy Refill Coordination Note  Leslie Sanders is a 47 y.o. female contacted today regarding refills of specialty medication(s) Dupilumab   Patient requested Courier to Provider Office   Delivery date: 09/29/23   Verified address: [facility]  [address], [place] [address]   Medication will be filled on 09/28/23.

## 2023-10-01 MED ORDER — DUPILUMAB 300 MG/2ML ~~LOC~~ SOSY
300.0000 mg | PREFILLED_SYRINGE | Freq: Once | SUBCUTANEOUS | Status: DC
  Filled 2023-10-01: qty 2

## 2023-10-09 ENCOUNTER — Ambulatory Visit: Payer: Medicare HMO

## 2023-10-16 ENCOUNTER — Other Ambulatory Visit (HOSPITAL_COMMUNITY): Payer: Self-pay

## 2023-10-16 ENCOUNTER — Ambulatory Visit (INDEPENDENT_AMBULATORY_CARE_PROVIDER_SITE_OTHER): Payer: Medicare HMO | Admitting: *Deleted

## 2023-10-16 ENCOUNTER — Other Ambulatory Visit: Payer: Self-pay

## 2023-10-16 VITALS — BP 126/82 | HR 80 | Temp 98.2°F | Resp 16 | Ht 62.0 in | Wt 109.2 lb

## 2023-10-16 DIAGNOSIS — J4550 Severe persistent asthma, uncomplicated: Secondary | ICD-10-CM

## 2023-10-16 MED ORDER — DUPILUMAB 300 MG/2ML ~~LOC~~ SOSY
300.0000 mg | PREFILLED_SYRINGE | Freq: Once | SUBCUTANEOUS | Status: AC
Start: 2023-10-16 — End: 2023-10-16
  Administered 2023-10-16: 300 mg via SUBCUTANEOUS

## 2023-10-16 NOTE — Progress Notes (Signed)
Diagnosis: Asthma  Provider:  Mannam, Praveen MD  Procedure: Injection  Dupixent (Dupilumab), Dose: 300 mg, Site: subcutaneous, Number of injections: 1  Post Care: Observation period completed  Discharge: Condition: Good, Destination: Home . AVS Declined  Performed by:  Williams, Kathryn A, RN       

## 2023-10-23 ENCOUNTER — Other Ambulatory Visit: Payer: Self-pay | Admitting: Internal Medicine

## 2023-10-23 ENCOUNTER — Other Ambulatory Visit: Payer: Self-pay

## 2023-10-23 ENCOUNTER — Telehealth: Payer: Self-pay | Admitting: Internal Medicine

## 2023-10-23 DIAGNOSIS — J4550 Severe persistent asthma, uncomplicated: Secondary | ICD-10-CM

## 2023-10-23 MED ORDER — DUPIXENT 300 MG/2ML ~~LOC~~ SOSY
300.0000 mg | PREFILLED_SYRINGE | SUBCUTANEOUS | 3 refills | Status: AC
  Filled 2023-10-23: qty 4, 28d supply, fill #0
  Filled 2023-11-17: qty 4, 28d supply, fill #1
  Filled 2023-12-17: qty 4, 28d supply, fill #2
  Filled 2024-06-14: qty 4, 28d supply, fill #3

## 2023-10-23 NOTE — Telephone Encounter (Signed)
Pt sch to see DR. Celine Mans 12/16/2022

## 2023-10-23 NOTE — Progress Notes (Addendum)
Specialty Pharmacy Refill Coordination Note  Leslie Sanders is a 47 y.o. female contacted today regarding refills of specialty medication(s) Dupilumab   Patient requested Courier to Provider Office   Delivery date: 10/27/23   Verified address: [facility]  [address] [place] [address]   Medication will be filled on 10/26/23, pending refill request.

## 2023-10-26 ENCOUNTER — Other Ambulatory Visit (HOSPITAL_COMMUNITY): Payer: Self-pay

## 2023-10-26 ENCOUNTER — Other Ambulatory Visit (HOSPITAL_BASED_OUTPATIENT_CLINIC_OR_DEPARTMENT_OTHER): Payer: Self-pay

## 2023-10-26 ENCOUNTER — Other Ambulatory Visit: Payer: Self-pay

## 2023-10-30 ENCOUNTER — Ambulatory Visit (INDEPENDENT_AMBULATORY_CARE_PROVIDER_SITE_OTHER): Payer: Medicare HMO

## 2023-10-30 VITALS — BP 109/73 | HR 85 | Temp 98.5°F | Resp 16 | Ht 62.0 in | Wt 110.0 lb

## 2023-10-30 DIAGNOSIS — J4550 Severe persistent asthma, uncomplicated: Secondary | ICD-10-CM | POA: Diagnosis not present

## 2023-10-30 MED ORDER — DUPILUMAB 300 MG/2ML ~~LOC~~ SOSY
300.0000 mg | PREFILLED_SYRINGE | Freq: Once | SUBCUTANEOUS | Status: AC
  Administered 2023-10-30: 300 mg via SUBCUTANEOUS
  Filled 2023-10-30: qty 2

## 2023-10-30 NOTE — Progress Notes (Signed)
Diagnosis: Asthma  Provider:  Chilton Greathouse MD  Procedure: Injection  Dupixent (Dupilumab), Dose: 300 mg, Site: subcutaneous, Number of injections: 1  Post Care: Patient declined observation  Discharge: Condition: Good, Destination: Home . AVS Declined  Performed by:  Adriana Mccallum, RN

## 2023-11-08 ENCOUNTER — Encounter: Payer: Self-pay | Admitting: Internal Medicine

## 2023-11-11 ENCOUNTER — Other Ambulatory Visit (HOSPITAL_COMMUNITY): Payer: Self-pay

## 2023-11-13 MED ORDER — DUPILUMAB 300 MG/2ML ~~LOC~~ SOSY
300.0000 mg | PREFILLED_SYRINGE | Freq: Once | SUBCUTANEOUS | Status: DC
  Filled 2023-11-13: qty 2

## 2023-11-17 ENCOUNTER — Other Ambulatory Visit: Payer: Self-pay

## 2023-11-17 NOTE — Progress Notes (Signed)
Specialty Pharmacy Refill Coordination Note  Leslie Sanders is a 47 y.o. female contacted today regarding refills of specialty medication(s) Dupilumab   Patient requested Courier to Provider Office   Delivery date: 11/23/23   Verified address: [facility]  [address] [place] [address]   Medication will be filled on 11/20/23.

## 2023-11-27 ENCOUNTER — Ambulatory Visit: Payer: Medicare HMO

## 2023-11-27 MED ORDER — DUPILUMAB 300 MG/2ML ~~LOC~~ SOSY
300.0000 mg | PREFILLED_SYRINGE | Freq: Once | SUBCUTANEOUS | Status: DC
Start: 2023-11-27 — End: 2023-11-30
  Filled 2023-11-27: qty 2

## 2023-11-30 ENCOUNTER — Encounter: Payer: Self-pay | Admitting: Internal Medicine

## 2023-12-17 ENCOUNTER — Other Ambulatory Visit (HOSPITAL_COMMUNITY): Payer: Self-pay | Admitting: Pharmacy Technician

## 2023-12-17 ENCOUNTER — Ambulatory Visit: Payer: Medicare HMO | Admitting: Internal Medicine

## 2023-12-17 ENCOUNTER — Other Ambulatory Visit (HOSPITAL_COMMUNITY): Payer: Self-pay

## 2023-12-17 ENCOUNTER — Telehealth: Payer: Self-pay | Admitting: Pharmacy Technician

## 2023-12-17 ENCOUNTER — Ambulatory Visit (INDEPENDENT_AMBULATORY_CARE_PROVIDER_SITE_OTHER): Payer: Medicare HMO

## 2023-12-17 VITALS — BP 105/71 | HR 67 | Temp 97.5°F | Resp 20 | Ht 62.0 in | Wt 109.0 lb

## 2023-12-17 DIAGNOSIS — J4550 Severe persistent asthma, uncomplicated: Secondary | ICD-10-CM | POA: Diagnosis not present

## 2023-12-17 MED ORDER — DUPILUMAB 300 MG/2ML ~~LOC~~ SOSY
300.0000 mg | PREFILLED_SYRINGE | Freq: Once | SUBCUTANEOUS | Status: AC
Start: 2023-12-17 — End: 2023-12-17
  Administered 2023-12-17: 300 mg via SUBCUTANEOUS
  Filled 2023-12-17: qty 2

## 2023-12-17 NOTE — Progress Notes (Signed)
 Specialty Pharmacy Refill Coordination Note  Leslie Sanders is a 48 y.o. female contacted today regarding refills of specialty medication(s) Dupilumab  (DUPIXENT )   Patient requested Courier to Provider Office   Delivery date: 01/07/24   Verified address: [facility]  [address]   Medication will be filled on 01/06/24.

## 2023-12-17 NOTE — Progress Notes (Signed)
 Diagnosis: Asthma  Provider:  Mannam, Praveen MD  Procedure: Injection  Dupixent  (Dupilumab ), Dose: 300 mg, Site: subcutaneous, Number of injections: 1  Injection Site(s): Left arm  Post Care: Patient declined observation  Discharge: Condition: Good, Destination: Home . AVS Declined  Performed by:  Kaydence Menard, RN

## 2023-12-17 NOTE — Telephone Encounter (Signed)
 Auth Submission: APPROVED Site of care: Site of care: [facility] Payer: [employer] MEDICARE Medication & CPT/J Code(s) submitted: DUPIXENT   Route of submission (phone, fax, portal): CMM Phone # Fax # Auth type: Pharmacy Benefit Units/visits requested: 300MG  Q14 DAYS Reference number:  Approval from: 09/08/23 to 12/07/24

## 2023-12-18 ENCOUNTER — Telehealth: Payer: Self-pay | Admitting: Pulmonary Disease

## 2023-12-18 ENCOUNTER — Telehealth: Payer: Medicare HMO | Admitting: Internal Medicine

## 2023-12-18 ENCOUNTER — Encounter: Payer: Self-pay | Admitting: Internal Medicine

## 2023-12-18 DIAGNOSIS — J45909 Unspecified asthma, uncomplicated: Secondary | ICD-10-CM

## 2023-12-18 DIAGNOSIS — J301 Allergic rhinitis due to pollen: Secondary | ICD-10-CM

## 2023-12-18 DIAGNOSIS — J309 Allergic rhinitis, unspecified: Secondary | ICD-10-CM

## 2023-12-18 DIAGNOSIS — J4550 Severe persistent asthma, uncomplicated: Secondary | ICD-10-CM

## 2023-12-18 MED ORDER — CETIRIZINE HCL 10 MG PO TABS: 10.0000 mg | ORAL_TABLET | Freq: Every day | ORAL | 5 refills | Status: AC

## 2023-12-18 MED ORDER — CETIRIZINE HCL 10 MG PO TABS: 10.0000 mg | ORAL_TABLET | Freq: Every day | ORAL | 11 refills | Status: DC

## 2023-12-18 MED ORDER — AZELASTINE HCL 0.1 % NA SOLN: 1.0000 | Freq: Two times a day (BID) | NASAL | 12 refills | Status: DC

## 2023-12-18 MED ORDER — AZELASTINE HCL 0.1 % NA SOLN: 1.0000 | Freq: Two times a day (BID) | NASAL | 12 refills | Status: AC

## 2023-12-18 MED ORDER — BREZTRI AEROSPHERE 160-9-4.8 MCG/ACT IN AERO: 2.0000 | INHALATION_SPRAY | Freq: Two times a day (BID) | RESPIRATORY_TRACT | 6 refills | Status: AC

## 2023-12-18 NOTE — Telephone Encounter (Signed)
 After Hours Triage:  Patient requesting medications be sent to a different pharmacy.  Sending astelin  and zyrtec  to [facility] at [address], [facility]  Dorn Chill, MD [facility] Office: [phone]   See Amion for personal pager PCCM on call pager [phone] until 7pm. Please call Elink 7p-7a. [phone]

## 2023-12-18 NOTE — Progress Notes (Signed)
 I connected with Leslie Sanders on 12/18/2023 by video enabled telemedicine application and verified that I am speaking with the correct person using two identifiers. Patient is at home, Physician is in office.    I discussed the limitations of evaluation and management by telemedicine. The patient expressed understanding and agreed to proceed.             Leslie Sanders    [id number]    [birth year]  Primary Care Physician:Mannam, Praveen, MD Date of Appointment: 12/18/2023 Established Patient Visit  Chief complaint:   Chief Complaint  Patient presents with   Asthma     HPI: Leslie Sanders is a 49 y.o. 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 On dupilumab . She has a history of childhood asthma. Gotten worse as an adult. Last felt well controlled as a teenager.   Interval Updates: Here for asthma follow up.  No interval hospitalizations or ED visits  Has not needed prednisone  No issues with dupixent  injections  She is not taking singulair , flonase  or zyrtec  and is having runny nose. Concerned about rhinorrhea.   Current Regimen: Breztri  BID, dupixent  (since July 2021), albuterol  Asthma Triggers: exertion, pollen, dust, fragrances, dogs Exacerbations in the last year: none  History of hospitalization or intubation: never  Allergy  Testing: serum IgE 01/2020 was 20 GERD: yes not on any therapy Allergic Rhinitis: yes and not well controlled ACT:  Asthma Control Test ACT Total Score  04/16/2023 10:14 AM 20  04/18/2022 10:43 AM 14  12/19/2021 10:52 AM 14   FeNO: never had  I have reviewed the patient's family social and past medical history and updated as appropriate.   Past Medical History:  Diagnosis Date   Anemia    (a) chronic blood loss hx of menorrhagia s/p endometrial biopsy 04/2015 (b) iron  deficient   Anxiety    B12 deficiency    Bipolar 1 disorder (HCC)    Cervical spinal stenosis    Cervical spondylosis without myelopathy 07/08/2016   Chronic  pain of right thumb 04/28/2018   Depression    GERD (gastroesophageal reflux disease)    Hemorrhoids    Pt with constipation secondary to iron  supplement. Pt has had rectal bleed secondary to internal hemorrhoids. 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   Migraine    PONV (postoperative nausea and vomiting)    PTSD (post-traumatic stress disorder)    Seasonal allergies    Vitamin E deficiency    Zinc deficiency     Past Surgical History:  Procedure Laterality Date   ABDOMINAL HYSTERECTOMY     BREAST LUMPECTOMY Bilateral    BREAST REDUCTION SURGERY     Carpal Metacarpal Arthroplasty Right 05/13/2019   CHOLECYSTECTOMY  1990   HAND SURGERY     HYSTERECTOMY ABDOMINAL WITH SALPINGECTOMY  11/2017   POSTERIOR CERVICAL LAMINECTOMY Bilateral 05/23/2019   RIGHT/LEFT HEART CATH AND CORONARY ANGIOGRAPHY N/A 01/13/2020   Procedure: RIGHT/LEFT HEART CATH AND CORONARY ANGIOGRAPHY;  Surgeon: Claudene Victory ORN, MD;  Location: [facility];  Service: Cardiovascular;  Laterality: N/A;   ROUX-EN-Y GASTRIC BYPASS  2003   in [place]   SPINAL FUSION  05/23/2019    Family History  Problem Relation Age of Onset   Schizophrenia Maternal Aunt    ADD / ADHD Son    ODD Son    Asthma Son    Breast cancer Mother    Arthritis/Rheumatoid Mother    Lupus Mother  HIV Father    Rheum arthritis Maternal Grandmother    COPD Maternal Grandmother    COPD Paternal Grandmother     Social History   Occupational History   Not on file  Tobacco Use   Smoking status: Former    Current packs/day: 0.00    Average packs/day: 1 pack/day for 15.0 years (15.0 ttl pk-yrs)    Types: Cigarettes    Start date: 12/09/2003    Quit date: 12/08/2018    Years since quitting: 5.0   Smokeless tobacco: Never  Vaping Use   Vaping status: Never Used  Substance and Sexual Activity   Alcohol use: Not Currently    Alcohol/week: 28.0 standard drinks of alcohol    Types: 28 Cans of beer per  week    Comment: Nothing in 4 years   Drug use: No   Sexual activity: Not Currently    Physical Exam: Last menstrual period 10/07/2016. Tele visit  Gen:     No distress, no coughing Breathing non labored, no wheezing, speaks in full sentences   Data Reviewed: Imaging: I have personally reviewed the chest xray 01/2021 which shows no acute pulmonary process.  Sniff test 11/2019 shows normal diarphgram movement  PFTs:      Latest Ref Rng & Units 05/28/2020   12:24 PM 03/14/2020   10:00 AM 03/14/2020    9:13 AM 11/25/2019    3:11 PM  PFT Results  FVC-Pre L 2.27  2.11  2.11  2.10   FVC-Predicted Pre % 81  75  75  75   FVC-Post L 2.41  2.35  2.35  2.24   FVC-Predicted Post % 86  84  84  80   Pre FEV1/FVC % % 63  66  66  68   Post FEV1/FCV % % 70  74  74  75   FEV1-Pre L 1.43  1.38  1.38  1.42   FEV1-Predicted Pre % 62  60  60  62   FEV1-Post L 1.69  1.73  1.73  1.67   DLCO uncorrected ml/min/mmHg 16.47  16.53  16.53  14.36   DLCO UNC% % 81  82  82  71   DLCO corrected ml/min/mmHg 16.47  16.53  16.53    DLCO COR %Predicted % 81  82  82    DLVA Predicted % 106  103  103  89   TLC L 3.94  4.08  4.08  4.03   TLC % Predicted % 82  85  85  84   RV % Predicted % 113  119  119  123    I have personally reviewed the patient's PFTs and and they show moderate airflow limitation with a positive BD response.   Labs:  Immunization status: Immunization History  Administered Date(s) Administered   Influenza Split 12/21/2017, 10/13/2018, 08/31/2019   Influenza,inj,Quad PF,6+ Mos 12/21/2017, 08/31/2019, 08/20/2020, 08/28/2021, 09/23/2022   Influenza,inj,Quad PF,6-35 Mos 08/20/2020   Influenza-Unspecified 08/23/2015, 12/21/2017, 10/08/2018, 08/31/2019, 08/20/2020   MMR 07/24/2015, 09/06/2015   PFIZER(Purple Top)SARS-COV-2 Vaccination 03/02/2020, 03/27/2020, 07/31/2020   PPD Test 03/14/2016   Pfizer Covid-19 Vaccine Bivalent Booster 48yrs & up 09/13/2021, 04/21/2022   Pneumococcal  Conjugate-13 10/31/2019   Pneumococcal Polysaccharide-23 12/21/2017   Td (Adult),5 Lf Tetanus Toxid, Preservative Free 09/06/2015, 03/10/2016   Tdap 07/24/2015    Assessment:  Severe persistent asthma copd overlap syndrome, controlled on dupixent . Chronic allergic rhinitis, not controlled  Plan/Recommendations:  For asthma: continue breztri (refilled today), continue albuterol  prn. continue dupilumab  injections  For nasal drainage Start using astelin  nasal spray. Start taking zyrtec  allergy  pill both sent to your pharmacy.   Return to Care: Return in about 3 months (around 03/17/2024).   Verdon Gore, MD Pulmonary and Critical Care Medicine [facility] Office:[phone]

## 2023-12-18 NOTE — Patient Instructions (Addendum)
 It was a pleasure to see you today!  Please schedule follow up scheduled with myself in 3 months.  If my schedule is not open yet, we will contact you with a reminder closer to that time. Please call [phone] if you haven't heard from us  a month before, and always call us  sooner if issues or concerns arise. You can also send us  a message through MyChart, but but aware that this is not to be used for urgent issues and it may take up to 5-7 days to receive a reply. Please be aware that you will likely be able to view your results before I have a chance to respond to them. Please give us  5 business days to respond to any non-urgent results.    For asthma: continue breztri (refilled today), continue albuterol  prn. continue dupilumab  injections  For nasal drainage Start using astelin  nasal spray. Start taking zyrtec  allergy  pill both sent to your pharmacy.

## 2023-12-31 ENCOUNTER — Ambulatory Visit: Payer: Medicare HMO

## 2023-12-31 VITALS — BP 116/76 | HR 68 | Temp 98.2°F | Resp 16 | Ht 62.0 in | Wt 111.2 lb

## 2023-12-31 DIAGNOSIS — J4550 Severe persistent asthma, uncomplicated: Secondary | ICD-10-CM | POA: Diagnosis not present

## 2023-12-31 MED ORDER — DUPILUMAB 300 MG/2ML ~~LOC~~ SOSY
300.0000 mg | PREFILLED_SYRINGE | Freq: Once | SUBCUTANEOUS | Status: AC
  Administered 2023-12-31: 300 mg via SUBCUTANEOUS
  Filled 2023-12-31: qty 2

## 2023-12-31 NOTE — Progress Notes (Signed)
Diagnosis: Asthma  Provider:  Chilton Greathouse MD  Procedure: Injection  Dupixent (Dupilumab), Dose: 300 mg, Site: subcutaneous, Number of injections: 1  Injection Site(s): Right arm  Post Care:  rigt arm injection  Discharge: Condition: Good, Destination: Home . AVS Declined  Performed by:  Rico Ala, LPN

## 2024-01-15 ENCOUNTER — Ambulatory Visit: Payer: Medicare HMO

## 2024-01-15 MED ORDER — DUPILUMAB 300 MG/2ML ~~LOC~~ SOSY
300.0000 mg | PREFILLED_SYRINGE | Freq: Once | SUBCUTANEOUS | Status: DC
  Filled 2024-01-15: qty 2

## 2024-01-28 ENCOUNTER — Other Ambulatory Visit: Payer: Self-pay

## 2024-02-03 ENCOUNTER — Other Ambulatory Visit: Payer: Self-pay

## 2024-02-08 ENCOUNTER — Other Ambulatory Visit (HOSPITAL_COMMUNITY): Payer: Self-pay

## 2024-03-01 ENCOUNTER — Encounter: Payer: Self-pay | Admitting: Internal Medicine

## 2024-03-01 ENCOUNTER — Other Ambulatory Visit: Payer: Self-pay

## 2024-03-07 ENCOUNTER — Other Ambulatory Visit: Payer: Self-pay

## 2024-03-16 ENCOUNTER — Telehealth: Payer: Self-pay | Admitting: *Deleted

## 2024-03-16 ENCOUNTER — Encounter: Payer: Self-pay | Admitting: *Deleted

## 2024-03-16 MED ORDER — ALBUTEROL SULFATE HFA 108 (90 BASE) MCG/ACT IN AERS: 2.0000 | INHALATION_SPRAY | Freq: Four times a day (QID) | RESPIRATORY_TRACT | 6 refills | Status: AC | PRN

## 2024-03-16 NOTE — Telephone Encounter (Signed)
 Copied from CRM [id number]. Topic: Clinical - Prescription Issue >> Mar 16, 2024 10:50 AM Konrad Dolores wrote: Reason for CRM: Brittanya stated that her pharmacy will not approve a refill until Dr. Celine Mans approves the refill for albuterol (VENTOLIN HFA) 108 (90 Base) MCG/ACT inhaler at [id number] - [place], [place] - [address] AT [facility] OF [address], [place] [address] Phone: [phone]  Fax: [phone]. Please follow up with the patient at [phone].  ATC patient to make her aware that her refill of albuterol had been sent to the pharmacy as requested.  Left detailed message per DPR.  Mychart message sent as well.

## 2024-03-21 ENCOUNTER — Other Ambulatory Visit (HOSPITAL_COMMUNITY): Payer: Self-pay

## 2024-04-08 ENCOUNTER — Telehealth: Payer: Self-pay | Admitting: Internal Medicine

## 2024-04-08 ENCOUNTER — Ambulatory Visit

## 2024-04-08 MED ORDER — DUPILUMAB 300 MG/2ML ~~LOC~~ SOSY
300.0000 mg | PREFILLED_SYRINGE | Freq: Once | SUBCUTANEOUS | Status: DC
  Filled 2024-04-08: qty 2

## 2024-04-08 NOTE — Telephone Encounter (Signed)
 Leslie Sanders

## 2024-04-29 ENCOUNTER — Other Ambulatory Visit (HOSPITAL_COMMUNITY): Payer: Self-pay

## 2024-05-17 ENCOUNTER — Telehealth: Payer: Self-pay | Admitting: Urology

## 2024-05-17 NOTE — Telephone Encounter (Signed)
 Additional notes in care everywhere. Most recent labs are from April. Labs were not performed day of visit from referring office.

## 2024-06-08 ENCOUNTER — Ambulatory Visit (INDEPENDENT_AMBULATORY_CARE_PROVIDER_SITE_OTHER): Admitting: Urology

## 2024-06-08 VITALS — BP 128/90 | HR 91 | Ht 62.0 in | Wt 106.0 lb

## 2024-06-08 DIAGNOSIS — N3281 Overactive bladder: Secondary | ICD-10-CM | POA: Diagnosis not present

## 2024-06-08 DIAGNOSIS — N393 Stress incontinence (female) (male): Secondary | ICD-10-CM

## 2024-06-08 DIAGNOSIS — N3941 Urge incontinence: Secondary | ICD-10-CM

## 2024-06-08 LAB — URINALYSIS, ROUTINE W REFLEX MICROSCOPIC
Bilirubin, UA: NEGATIVE
Glucose, UA: NEGATIVE
Leukocytes,UA: NEGATIVE
Nitrite, UA: NEGATIVE
Protein,UA: NEGATIVE
Specific Gravity, UA: 1.02 (ref 1.005–1.030)
Urobilinogen, Ur: 0.2 mg/dL (ref 0.2–1.0)
pH, UA: 5.5 (ref 5.0–7.5)

## 2024-06-08 LAB — MICROSCOPIC EXAMINATION

## 2024-06-08 LAB — BLADDER SCAN AMB NON-IMAGING: Scan Result: 9

## 2024-06-08 MED ORDER — SOLIFENACIN SUCCINATE 10 MG PO TABS: 10.0000 mg | ORAL_TABLET | Freq: Every day | ORAL | 11 refills | Status: AC

## 2024-06-08 NOTE — Progress Notes (Signed)
 Chief Complaint: No chief complaint on file.   History of Present Illness:  Leslie Sanders is a 48 y.o. female who is seen in consultation from Mannam, Praveen, MD for evaluation of OAB.  Over the past 6 months she has had increasing urinary frequency, urgency urgency incontinence.  Smaller degree of stress urinary incontinence.  She goes through several pads a day.  She changes these fairly often if she does not like having them get very wet.  She has a good stream.  She feels like she empties well.  She denies frequent urinary tract infections.  She has had 2 vaginal deliveries.  She denies significant issues with fecal leakage.   Past Medical History:  Past Medical History:  Diagnosis Date   Anemia    (a) chronic blood loss hx of menorrhagia s/p endometrial biopsy 04/2015 (b) iron  deficient   Anxiety    B12 deficiency    Bipolar 1 disorder (HCC)    Cervical spinal stenosis    Cervical spondylosis without myelopathy 07/08/2016   Chronic pain of right thumb 04/28/2018   Depression    GERD (gastroesophageal reflux disease)    Hemorrhoids    Pt with constipation secondary to iron  supplement. Pt has had rectal bleed secondary to internal hemorrhoids. 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   Migraine    PONV (postoperative nausea and vomiting)    PTSD (post-traumatic stress disorder)    Seasonal allergies    Vitamin E deficiency    Zinc deficiency     Past Surgical History:  Past Surgical History:  Procedure Laterality Date   ABDOMINAL HYSTERECTOMY     BREAST LUMPECTOMY Bilateral    BREAST REDUCTION SURGERY     Carpal Metacarpal Arthroplasty Right 05/13/2019   CHOLECYSTECTOMY  1990   HAND SURGERY     HYSTERECTOMY ABDOMINAL WITH SALPINGECTOMY  11/2017   POSTERIOR CERVICAL LAMINECTOMY Bilateral 05/23/2019   RIGHT/LEFT HEART CATH AND CORONARY ANGIOGRAPHY N/A 01/13/2020   Procedure: RIGHT/LEFT HEART CATH AND CORONARY ANGIOGRAPHY;   Surgeon: Claudene Victory ORN, MD;  Location: [facility];  Service: Cardiovascular;  Laterality: N/A;   ROUX-EN-Y GASTRIC BYPASS  2003   in [place]   SPINAL FUSION  05/23/2019    Allergies:  Allergies  Allergen Reactions   Tylenol  [Acetaminophen ] Anaphylaxis    Per patient   Adhesive  [Tape] Rash   Hydrocodone-Acetaminophen  Other (See Comments) and Nausea And Vomiting   Nsaids Other (See Comments)    S/p bariatric surgery; increased risk of ulcers, gastritis and cancer   Latex Rash    Family History:  Family History  Problem Relation Age of Onset   Schizophrenia Maternal Aunt    ADD / ADHD Son    ODD Son    Asthma Son    Breast cancer Mother    Arthritis/Rheumatoid Mother    Lupus Mother    HIV Father    Rheum arthritis Maternal Grandmother    COPD Maternal Grandmother    COPD Paternal Grandmother     Social History:  Social History   Tobacco Use   Smoking status: Former    Current packs/day: 0.00    Average packs/day: 1 pack/day for 15.0 years (15.0 ttl pk-yrs)    Types: Cigarettes    Start date: 12/09/2003    Quit date: 12/08/2018    Years since quitting: 5.5   Smokeless tobacco: Never  Vaping Use   Vaping status: Never Used  Substance Use Topics   Alcohol use: Not Currently    Alcohol/week: 28.0 standard drinks of alcohol    Types: 28 Cans of beer per week    Comment: Nothing in 4 years   Drug use: No    Review of symptoms:  Constitutional:  Negative for unexplained weight loss, night sweats, fever, chills ENT:  Negative for nose bleeds, sinus pain, painful swallowing CV:  Negative for chest pain, shortness of breath, exercise intolerance, palpitations, loss of consciousness Resp:  Negative for cough, wheezing, shortness of breath GI:  Negative for nausea, vomiting, diarrhea, bloody stools GU:  Positives noted in HPI; otherwise negative for gross hematuria, dysuria, urinary incontinence Neuro:  Negative for seizures, poor balance, limb weakness, slurred  speech Psych:  Negative for lack of energy, depression, anxiety Endocrine:  Negative for polydipsia, polyuria, symptoms of hypoglycemia (dizziness, hunger, sweating) Hematologic:  Negative for anemia, purpura, petechia, prolonged or excessive bleeding, use of anticoagulants  Allergic:  Negative for difficulty breathing or choking as a result of exposure to anything; no shellfish allergy ; no allergic response (rash/itch) to materials, foods  Physical exam: LMP 10/07/2016  GENERAL APPEARANCE:  Well appearing, well developed, well nourished, NAD HEENT: Atraumatic, Normocephalic. NECK: Normal appearance LUNGS: Normal inspiratory and expiratory excursion HEART: Regular Rate EXTREMITIES: Moves all extremities well.  Without clubbing, cyanosis, or edema. NEUROLOGIC:  Alert and oriented x 3, normal gait, CN II-XII grossly intact.  MENTAL STATUS:  Appropriate. SKIN:  Warm, dry and intact.    Results:  I have reviewed referring/prior physicians records  I have reviewed urinalysis--clear  I reviewed prior imaging studies--CT from 2021 reviewed.  No abnormal GU findings.  Assessment: Overactive bladder-wet  Small degree of stress incontinence   Plan: -Reassurance that she does not have significant underlying abnormalities  - I gave her an overactive bladder guide sheet  - She was given a prescription for solifenacin to take for her symptoms  - I will have her come back in about 3 months to recheck symptoms

## 2024-06-14 ENCOUNTER — Encounter: Payer: Self-pay | Admitting: Internal Medicine

## 2024-06-14 ENCOUNTER — Other Ambulatory Visit: Payer: Self-pay

## 2024-06-14 ENCOUNTER — Other Ambulatory Visit (HOSPITAL_COMMUNITY): Payer: Self-pay

## 2024-06-16 ENCOUNTER — Telehealth: Payer: Self-pay

## 2024-06-16 NOTE — Telephone Encounter (Signed)
 Called patient to try to scheduled. No answer.Left a voicemail. We have her medication here and she has not been seen in months. We also need to verify her insurance coverage when/if she returns our call.

## 2024-06-20 ENCOUNTER — Other Ambulatory Visit: Payer: Self-pay

## 2024-07-01 ENCOUNTER — Telehealth: Payer: Self-pay

## 2024-07-04 ENCOUNTER — Ambulatory Visit (INDEPENDENT_AMBULATORY_CARE_PROVIDER_SITE_OTHER)

## 2024-07-04 VITALS — BP 114/73 | HR 74 | Temp 97.8°F | Resp 16 | Ht 62.0 in | Wt 111.4 lb

## 2024-07-04 DIAGNOSIS — J4550 Severe persistent asthma, uncomplicated: Secondary | ICD-10-CM

## 2024-07-04 MED ORDER — DUPILUMAB 300 MG/2ML ~~LOC~~ SOSY
300.0000 mg | PREFILLED_SYRINGE | Freq: Once | SUBCUTANEOUS | Status: AC
  Administered 2024-07-04: 300 mg via SUBCUTANEOUS
  Filled 2024-07-04: qty 2

## 2024-07-04 NOTE — Progress Notes (Signed)
 Diagnosis: Asthma  Provider:  Mannam, Praveen MD  Procedure: Injection  Dupixent  (Dupilumab ), Dose: 300 mg, Site: subcutaneous, Number of injections: 1  Injection Site(s): Right arm  Post Care: right arm injection  Discharge: Condition: Good, Destination: Home . AVS Declined  Performed by:  Lauran Pollard, LPN

## 2024-07-13 ENCOUNTER — Telehealth: Payer: Self-pay | Admitting: Pharmacy Technician

## 2024-07-13 NOTE — Telephone Encounter (Addendum)
 CHANGE IN INSURANCE.  Auth Submission: NO AUTH NEEDED Site of care: Site of care: [facility] Payer: Medicare a/b Medication & CPT/J Code(s) submitted: dupixent  Diagnosis Code:  Route of submission (phone, fax, portal):  Phone # Fax # Auth type: pharmacy benefit Units/visits requested: 30mmg q28days Reference number:  Approval from: 07/13/24 to 01/08/24

## 2024-07-18 ENCOUNTER — Ambulatory Visit

## 2024-07-18 MED ORDER — DUPILUMAB 300 MG/2ML ~~LOC~~ SOSY
300.0000 mg | PREFILLED_SYRINGE | Freq: Once | SUBCUTANEOUS | Status: DC
  Filled 2024-07-18: qty 2

## 2024-07-19 ENCOUNTER — Encounter: Payer: Self-pay | Admitting: Internal Medicine

## 2024-07-19 ENCOUNTER — Other Ambulatory Visit (HOSPITAL_COMMUNITY): Payer: Self-pay

## 2024-07-19 ENCOUNTER — Ambulatory Visit (INDEPENDENT_AMBULATORY_CARE_PROVIDER_SITE_OTHER)

## 2024-07-19 VITALS — BP 131/61 | HR 64 | Temp 98.2°F | Resp 16 | Ht 62.0 in | Wt 113.2 lb

## 2024-07-19 DIAGNOSIS — J4550 Severe persistent asthma, uncomplicated: Secondary | ICD-10-CM | POA: Diagnosis not present

## 2024-07-19 MED ORDER — DUPILUMAB 300 MG/2ML ~~LOC~~ SOSY
300.0000 mg | PREFILLED_SYRINGE | Freq: Once | SUBCUTANEOUS | Status: AC
  Administered 2024-07-19 (×2): 300 mg via SUBCUTANEOUS
  Filled 2024-07-19: qty 2

## 2024-07-19 NOTE — Progress Notes (Signed)
 Diagnosis: Asthma  Provider:  Mannam, Praveen MD  Procedure: Injection  Dupixent  (Dupilumab ), Dose: 300 mg, Site: subcutaneous, Number of injections: 1  Injection Site(s): Right arm  Post Care: right arm injection  Discharge: Condition: Good, Destination: Home . AVS Declined  Performed by:  Maximiano JONELLE Pouch, LPN

## 2024-07-19 NOTE — Progress Notes (Signed)
 Patient had a change in insurance and her Dupixent  is now covered through [employer] part A/B through [place]. Disenrolling from [facility] services at this time.

## 2024-08-02 ENCOUNTER — Ambulatory Visit

## 2024-08-04 ENCOUNTER — Ambulatory Visit

## 2024-08-04 MED ORDER — DUPILUMAB 300 MG/2ML ~~LOC~~ SOSY
300.0000 mg | PREFILLED_SYRINGE | Freq: Once | SUBCUTANEOUS | Status: DC
  Filled 2024-08-04: qty 2

## 2024-08-05 ENCOUNTER — Encounter: Payer: Self-pay | Admitting: Internal Medicine

## 2024-08-05 NOTE — H&P (View-Only) (Signed)
 ICD-10-CM   1. Pre-op evaluation  Z01.818        History of Present Illness: Leslie Sanders is a 48 y.o.  female  with a history of thighplasty/Left thigh scar tightness and dog ear, right thigh with seroma cavity.  She presents for preoperative evaluation for upcoming procedure, revision of scar dog ear thigh (Left), seroma excision lower extremity (Right), scheduled for 08/18/2024 with Dr. Blair Dupont.  Problem list: Problem List[1]  Past Medical History: Allergies: Allergies[2]  Current Medications: Current Medications[3]  Past Medical Problems: Medical History[4]  Past Surgical History: Surgical History[5]  The patient has had anesthesia or sedation in the past.   The patient has had + PONV with anesthesia.    Social History: Social History   Socioeconomic History  . Marital status: Divorced    Spouse name: Not on file  . Number of children: Not on file  . Years of education: Not on file  . Highest education level: Not on file  Occupational History  . Not on file  Tobacco Use  . Smoking status: Former    Current packs/day: 0.00    Types: Cigarettes    Quit date: 09/08/2018    Years since quitting: 5.9  . Smokeless tobacco: Never  . Tobacco comments:        Quit 2019.  Smoked approx 20 up to 0.5ppd.   Substance and Sexual Activity  . Alcohol use: Not Currently    Alcohol/week: 2.0 standard drinks of alcohol  . Drug use: No  . Sexual activity: Not Currently  Other Topics Concern  . Not on file  Social History Narrative  . Not on file   Social Drivers of Health   Food Insecurity: Food Insecurity Present (10/20/2023)   Received from [facility]   Food vital sign   . Within the past 12 months, you worried that your food would run out before you got money to buy more: Often true   . Within the past 12 months, the food you bought just didn't last and you didn't have money to get more: Sometimes true  Transportation Needs: No Transportation  Needs (10/20/2023)   Received from [facility] - Transportation   . Lack of Transportation (Medical): No   . Lack of Transportation (Non-Medical): No  Safety: Low Risk  (04/05/2024)   Safety   . How often does anyone, including family and friends, physically hurt you?: Never   . How often does anyone, including family and friends, insult or talk down to you?: Never   . How often does anyone, including family and friends, threaten you with harm?: Never   . How often does anyone, including family and friends, scream or curse at you?: Never  Living Situation: Low Risk  (02/19/2024)   Living Situation   . What is your living situation today?: I have a steady place to live   . Think about the place you live. Do you have problems with any of the following? Choose all that apply:: Not on file    Family History: Family History[6]  Review of Systems: General ROS: negative Dermatological ROS: negative Cardiovascular ROS: negative ENT ROS: negative Gastrointestinal ROS: negative  Physical Exam: Vital Signs BP 112/72   Pulse 73   Temp 98.2 F (36.8 C) (Temporal)    General: NAD, alert and well-appearing HEENT:negative Neck: normal and no adenopathy Chest: Normal chest wall and respirations. Clear to auscultation. Cardiac: Regular rate and rhythm Abdomen: soft and non-distended GU:  deferred Musculoskeletal: Normal. Neuro: normal mood Extremities: medial thigh incisions- left upper thigh dog ear, right thigh distal incision has seroma cavity Skin: no rashes   Assessment: 48 y.o.female  with a history of thigh plasty/Left thigh scar tightness and dog ear, right thigh with seroma cavity.    Plan: Revision of scar dog ear thigh (Left), seroma excision lower extremity (Right), scheduled for 08/18/2024 with Dr. Blair Dupont. Risks, benefits, and alternatives of procedure discussed, questions answered and consent obtained.    Allergies to tape, suture materials and glues,  blood products, topical preparations, or injected agents Anesthesia risks Excessive bleeding Change in skin sensation Damage to deeper structures - such as nerves, blood vessels, muscles - can occur and may be temporary or permanent Infection Poor healing of incisions Possibility of revision surgery Recurrence of lesion   Electronically signed by: Burnard Earnie Finder, FNP 08/05/2024 10:57 AM               [1] Patient Active Problem List Diagnosis  . Sickle-cell trait  . Acne  . Cervical spinal stenosis  . Vitamin B12 deficiency  . Iron  deficiency anemia  . Malabsorption syndrome  . Fibrocystic breast disease  . Allergic rhinitis due to pollen  . Chronic right shoulder pain  . Fatigue  . Posttraumatic stress disorder  . Cervical spondylosis without myelopathy  . Smoker  . Panic disorder  . Right wrist pain  . Hot flashes due to menopause  . Chronic pain of right thumb  . Keratoconjunctivitis sicca of both eyes not specified as Sjogren's  . Vitreous floater, bilateral  . Regular astigmatism of both eyes  . Myositis of upper extremity  . Vaginal dryness, menopausal  . Debility  . Fibromyalgia  . Arthritis of carpometacarpal joint  . HSV-1 (herpes simplex virus 1) infection  . Cervical radiculopathy  . Chronic pain syndrome  . Left ovarian cyst  . Arthropathy of thoracic facet joint  . Flexor tenosynovitis of thumb  . Migraine with visual aura  . Asthma-COPD overlap syndrome  . Bipolar 1 disorder (HCC)  . Lumbar facet arthropathy  . Tailor's bunion of both feet  . Flat feet, bilateral  . Chronic buttock pain  . Spondylosis of lumbosacral region without myelopathy or radiculopathy  . Seborrheic dermatitis  . Lumbar radiculopathy  . Arthropathy of lumbar facet joint  . Excess skin  . Suicidal ideation  . Severe persistent asthma  . Nausea  . 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 Vertebrogenic pain syndrome  . Panniculitis   . Encounter for annual physical exam  . Severe allergy   . Bilateral hand pain  . Chronic midline thoracic back pain  . Screening mammogram for breast cancer  . History of weight loss  . Abnormal gait  . Injury of medial collateral ligament (MCL) of knee  . 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 Muscle weakness  . Nicotine  dependence  . Oropharyngeal dysphagia  [2] Allergies Allergen Reactions  . Acetaminophen  Anaphylaxis    Per patient  . Latex Rash  . Surgical Tape Rash  . Adhesive Rash    Pt says band aids are okay.  SABRA Hydrocodone-Acetaminophen  GI Intolerance and Dizziness  . Nsaids (Non-Steroidal Anti-Inflammatory Drug) Other (See Comments)    S/p bariatric surgery; increased risk of ulcers, gastritis and cancer  [3]  Current Outpatient Medications:  .  albuterol  2.5 mg /3 mL (0.083 %) nebulizer solution, Take 2.5 mg by nebulization every 6 (six)  hours as needed for wheezing or shortness of breath., Disp: , Rfl:  .  albuterol  HFA (PROVENTIL  HFA;VENTOLIN  HFA;PROAIR  HFA) 90 mcg/actuation inhaler, Inhale 2 puffs every 6 (six) hours as needed for wheezing or shortness of breath., Disp: , Rfl:  .  azelastine  (ASTELIN ) 137 mcg (0.1 %) nasal spray, Administer 1 spray into each nostril 2 (two) times a day., Disp: , Rfl:  .  budesonide -glycopyr-formoterol  (Breztri  Aerosphere) 160-9-4.8 mcg/actuation HFAA, Inhale 2 puffs Once Daily., Disp: , Rfl:  .  calcium  citrate-vitamin D3 315 mg-5 mcg (200 unit) tab, Take 2 tablets by mouth 2 (two) times a day., Disp: 120 tablet, Rfl: 2 .  cetirizine  (ZyrTEC ) 10 mg tablet, Take 10 mg by mouth daily., Disp: , Rfl:  .  docusate sodium  (COLACE) 100 mg capsule, Take 100 mg by mouth daily., Disp: , Rfl:  .  DULoxetine  (CYMBALTA ) 60 mg capsule, Take 60 mg by mouth daily., Disp: , Rfl:  .  Dupixent  Syringe 300 mg/2 mL syrg injection, Inject 300 mg under the skin every 14 (fourteen) days., Disp: , Rfl:  .  EPINEPHrine   (EPIPEN ) 0.3 mg/0.3 mL injection syringe, Inject 1 Syringe into the thigh once as needed for anaphylaxis., Disp: , Rfl:  .  ergocalciferol  (VITAMIN D2) 1,250 mcg (50,000 unit) capsule, Take 1 capsule by mouth once a week., Disp: 12 capsule, Rfl: 3 .  fluticasone  propionate (FLONASE ) 50 mcg/spray nasal spray, ADMINISTER 2 SPRAYS IN THE LEFT NOSTRIL TWICE DAILY AS DIRECTED, Disp: 48 g, Rfl: 0 .  folic acid  (FOLVITE ) 1 mg tablet, TAKE 1 TABLET(1 MG) BY MOUTH DAILY, Disp: 30 tablet, Rfl: 11 .  ipratropium-albuteroL  (DUO-NEB) 0.5-2.5 mg/3 mL nebulizer solution, Take 3 mL by nebulization every 6 (six) hours as needed for wheezing for up to 30 days., Disp: 160 mL, Rfl: 11 .  iron  bisgly,ps-folic acid -B-C#12-succ (Irospan 24/6) 65 mg-65 mg -1,000 mcg (24) tab, Take 1 tablet by mouth Once Daily., Disp: 90 tablet, Rfl: 0 .  miscellaneous medical supply (C-Tub) misc, Wedge for legs, Disp: 1 each, Rfl: 0 .  montelukast  (SINGULAIR ) 10 mg tablet, Take 1 tablet by mouth nightly., Disp: , Rfl:  .  multivitamin with minerals (Daily Multivitamin-Minerals) tab, Take 1 tablet by mouth Once Daily., Disp: , Rfl:  .  oxyBUTYnin (DITROPAN XL) 5 mg 24 hr tablet, TAKE 1 TABLET(5 MG) BY MOUTH DAILY, Disp: 90 tablet, Rfl: 0 .  syringe with needle (Syringe 3cc/25Gx1) 3 mL 25 gauge x 1 syrg, 1 Syringe by miscellaneous route every 30 (thirty) days., Disp: 3 each, Rfl: 1 [4] Past Medical History: Diagnosis Date  . Acne   . Acute bilateral low back pain without sciatica 08/19/2016  . Alcohol use disorder, moderate, dependence    (CMD) 10/12/2016  . Anemia due to chronic blood loss 04/16/2015   Patient with history of menorrhagia.  She is s/p endometrial biopsy 04/2015 and evaluation for uterine fibroid was negative.  Gyn - Dr. Teresa  . Ankle fracture, left 2003  . Anxiety   . Asthma-COPD overlap syndrome    (CMD) 02/13/2021  . Bipolar 1 disorder    (CMD)   . Blood in the stool   . Breast cyst   . Cervical spinal stenosis   .  Cervical spondylosis without myelopathy 07/08/2016  . Chronic pain of right thumb 04/28/2018  . Chronic right shoulder pain 03/16/2016   MRI right shoulder 02/2013 - right shoulder rotator cuff tendonopathy/tendonitis  . CMC arthritis 04/28/2018  . Edema 04/16/2015  . Fatigue  03/16/2016  . Fibrocystic breast disease   . Gastro-esophageal reflux disease without esophagitis 09/15/2018  . GERD (gastroesophageal reflux disease)   . Hot flashes due to menopause 01/29/2018  . Iron  deficiency anemia 04/16/2015  . Lactose intolerance 10/12/2016  . Malabsorption syndrome (CMD)   . Memory loss   . Panic disorder 12/14/2017  . Posttraumatic stress disorder 05/29/2016  . Regular astigmatism of both eyes 06/28/2018  . Right wrist pain 01/07/2018  . Sickle cell trait   . Smoker   . Submucous leiomyoma of uterus   . Tear of right rotator cuff 03/05/2021  . Vitamin B12 deficiency   . Vitamin D  deficiency 06/24/2017  . Vitamin E deficiency 06/24/2017  . Vitreous floater, bilateral 06/28/2018  [5] Past Surgical History: Procedure Laterality Date  . ABDOMINAL WALL SURGERY N/A 04/23/2023   PANNICULECTOMY, Blair Dupont, MD  . BICEPS TENODESIS Right 04/03/2021   Davina JINNY Rosebush, MD  . BREAST CYST EXCISION    . BREAST EXCISIONAL BIOPSY Left    benign  . BREAST LUMPECTOMY Bilateral   . CARPAL METACARPAL ARTHROPLASTY Right 05/13/2019   Lynwood Charlie Curly, MD  . CHOLECYSTECTOMY  1990  . COLONOSCOPY  09/30/2016  . COLONOSCOPY N/A 09/06/2021   grade I hemorrhoids; repeat in 10 years  . DENTAL SURGERY     . EPIDURAL BLOCK INJECTION Bilateral 05/07/2021   L4-S1 #1/2; Janus Von Adora Blanch, MD  . EPIDURAL BLOCK INJECTION Bilateral 05/23/2021   L4-S1 #2/2; Janus Von Adora Blanch, MD  . ESOPHAGOGASTRODUODENOSCOPY  2013  . FACET INJECTION Left 02/27/2022   Left L5-S1 facet joint injection with depo;  Janus Von Adora Blanch, MD  . LAPAROSCOPIC TOTAL HYSTERECTOMY N/A 10/28/2017   Kate Rosaline Bellman, MD  . LEG SURGERY N/A 03/10/2024   ABDOMINAL SCAR CONTRACTURE RELEASE, EXCISION AND CLOSURE performed by Blair Dupont, MD at [facility] OR  . LUMBAR EPIDURAL INJECTION N/A 05/22/2022   S1 Transforaminal; Janus Von Adora Blanch, MD  . LUMBAR EPIDURAL INJECTION N/A 09/02/2022   Interlaminer L1-2; Blanch, Janus Von Adora, MD  . PARTIAL HYSTERECTOMY    . POSTERIOR CERVICAL LAMINECTOMY N/A 09/09/2018   and fusion with plates and autograft; Louvella Lynwood Home, MD  . POSTERIOR CERVICAL LAMINECTOMY Bilateral 05/23/2019   Fusion with [employer] and use of BMP; Louvella Lynwood Home, MD  . RADIOFREQUENCY ABLATION NERVES Left 10/18/2020   Medial denervation, thoracic T9-11 #1/2; Janus Von Adora Blanch, MD  . RADIOFREQUENCY ABLATION NERVES Right 11/13/2020   Medial denervation lumbar. thoracic-cervical #2/2 T9-T11; Janus Von Adora Blanch, MD  . RADIOFREQUENCY ABLATION NERVES Left 06/18/2021   #1/2 L4-S1; Janus Von Adora Blanch, MD  . RADIOFREQUENCY ABLATION NERVES Right 07/25/2021   L4-S1 #2/2; Janus Von Adora Blanch, MD  . RADIOFREQUENCY ABLATION NERVES Left 12/26/2021   Medail denervation T9-T10 and T10-T11 RFA #1/2; Janus Von Adora Blanch, MD  . RADIOFREQUENCY ABLATION NERVES Right 01/28/2022   T9-T10 AND T10-T11 #2/2;  Janus Von Adora Blanch, MD  . RADIOFREQUENCY ABLATION NERVES Left 06/13/2022   L4-S1 #1/2 NIMBUS;  Janus Von Adora Blanch, MD  . RADIOFREQUENCY ABLATION NERVES Right 07/15/2022   L4-S1 #2/2 NIMBUS; Janus Von Adora Blanch, MD  . REDUCTION MAMMAPLASTY Bilateral 2002  . ROUX-EN-Y PROCEDURE  2003   in [place]   . SACROILIAC JOINT INJECTION Left 11/26/2021   wing transverse process; Janus Von Adora Blanch, MD  . SHOULDER ARTHROSCOPY W/ ROTATOR CUFF REPAIR Right 04/03/2021   Subacromial decompression and right distal clavicle excision;  Davina JINNY Rosebush, MD  . TISSUE EXPANDER REMOVAL N/A  03/10/2024   LOCAL TISSUE REARRANGEMENT AND THIGHTPLASTY BILATERAL performed by Blair Dupont, MD at [facility] OR  [6] Family History Problem Relation Name Age of Onset  . Cancer Mother Gwendolyn        Breast Cancer  . Breast cancer Mother Gwendolyn 50  . Rheum arthritis Mother Othel   . Dementia Mother Othel   . Depression Mother Othel   . Tremor Mother Othel   . Arthritis Father    . Tremor Father    . Depression Sister    . Neuropathy Sister    . Cancer Maternal Grandmother         Breast Cancer  . Rheum arthritis Maternal Grandmother    . Glaucoma Maternal Grandmother    . Sickle cell trait Daughter    . ADD / ADHD Son    . ODD Son    . Cancer Maternal Aunt         Breast Cancer  . Diabetes Maternal Aunt    . Schizophrenia Maternal Aunt    . Lupus Cousin    . Other Other         cerebral artery occlusion  . Migraines Other    . Breast cancer Paternal Grandmother Gabriella Jansky   . Psoriasis Neg Hx    . Macular degeneration Neg Hx    . Anesthesia problems Neg Hx    . Ankylosing spondylitis Neg Hx    . Gout Neg Hx

## 2024-08-09 ENCOUNTER — Ambulatory Visit

## 2024-08-09 MED ORDER — DUPILUMAB 300 MG/2ML ~~LOC~~ SOSY
300.0000 mg | PREFILLED_SYRINGE | Freq: Once | SUBCUTANEOUS | Status: DC
Start: 2024-08-09 — End: 2024-08-10
  Filled 2024-08-09: qty 2

## 2024-08-10 ENCOUNTER — Encounter: Payer: Self-pay | Admitting: Internal Medicine

## 2024-08-18 ENCOUNTER — Emergency Department (HOSPITAL_COMMUNITY)
Admission: EM | Admit: 2024-08-18 | Discharge: 2024-08-18 | Disposition: A | Discharge status: Home / Self Care | Source: Ambulatory Visit

## 2024-08-18 ENCOUNTER — Encounter (HOSPITAL_COMMUNITY): Payer: Self-pay

## 2024-08-18 ENCOUNTER — Other Ambulatory Visit: Payer: Self-pay

## 2024-08-18 DIAGNOSIS — Z4889 Encounter for other specified surgical aftercare: Secondary | ICD-10-CM | POA: Insufficient documentation

## 2024-08-18 DIAGNOSIS — Z91040 Latex allergy status: Secondary | ICD-10-CM | POA: Diagnosis not present

## 2024-08-18 DIAGNOSIS — Z4801 Encounter for change or removal of surgical wound dressing: Secondary | ICD-10-CM | POA: Diagnosis present

## 2024-08-18 NOTE — Discharge Instructions (Signed)
 Spoke to your surgeons.  Please follow-up with them in clinic.   Keep the area covered.  You can wrap the area and apply pressure for 20 minutes at a time.  Do not look at it for least 20 minutes and keep the pressure on it for this time.  If he continues to soak through multiple gauzes then please come back to the ED.  Otherwise, the area looks clean.

## 2024-08-18 NOTE — ED Triage Notes (Signed)
 Pt presents to ED from home C/O post-op wound check. Pt states I had surgery this morning, and one of my wounds opened and keep gushing blood. They told me to come to the ER right away. VSS in triage.

## 2024-08-18 NOTE — ED Provider Notes (Signed)
 [place] EMERGENCY DEPARTMENT AT [facility] Provider Note   CSN: [id number] Arrival date & time: 08/18/24  1600     Patient presents with: Post-op Problem   Leslie Sanders is a 48 y.o. female.   HPI     Patient presents because of wound check.  Patient had procedure done by plastic surgery at [facility] today.  Had scar revision completed.  Patient noticed that she had some spotting from incision site on left inner leg whenever she got home.  Felt like it was worse whenever she was up.  No swelling to the area.  Otherwise, denies all complaints.  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 She presents for preoperative evaluation for upcoming procedure, revision of scar dog ear thigh (Left), seroma excision lower extremity (Right), scheduled for 08/18/2024 with Dr. Blair Dupont.    Prior to Admission medications   Medication Sig Start Date End Date Taking? Authorizing Provider  albuterol  (PROVENTIL ) (2.5 MG/3ML) 0.083% nebulizer solution USE 1 VIAL VIA NEBULIZER EVERY 6 HOURS AS NEEDED FOR WHEEZING OR SHORTNESS OF BREATH 01/09/23   Meade Verdon RAMAN, MD  albuterol  (VENTOLIN  HFA) 108 (90 Base) MCG/ACT inhaler INHALE 1 PUFF INTO THE LUNGS EVERY 6 HOURS AS NEEDED FOR WHEEZING OR SHORTNESS OF BREATH 03/10/23   Meade Verdon RAMAN, MD  albuterol  (VENTOLIN  HFA) 108 [phone] Base) MCG/ACT inhaler Inhale 2 puffs into the lungs every 6 (six) hours as needed for wheezing or shortness of breath. 03/16/24   Meade Verdon RAMAN, MD  azelastine  (ASTELIN ) 0.1 % nasal spray Place 1 spray into both nostrils 2 (two) times daily. Use in each nostril as directed 12/18/23   Kara Dorn NOVAK, MD  Budeson-Glycopyrrol-Formoterol  (BREZTRI  AEROSPHERE) 160-9-4.8 MCG/ACT AERO Inhale 2 puffs into the lungs 2 (two) times daily. 12/18/23   Meade Verdon RAMAN, MD  Carbinoxamine  Maleate 4 MG TABS  4-8 mg up to twice daily as needed. May cause drowsiness.  Try during a time when you are at home and don't need to leave to see if you tolerate this.  If not, can use at night only. 05/15/22   Marinda Rocky SAILOR, MD  cetirizine  (ZYRTEC  ALLERGY ) 10 MG tablet Take 1 tablet (10 mg total) by mouth daily. 12/18/23   Kara Dorn NOVAK, MD  COVID-19 mRNA bivalent vaccine, Pfizer, (PFIZER COVID-19 VAC BIVALENT) injection Inject into the muscle. 04/21/22   Luiz Channel, MD  COVID-19 mRNA bivalent vaccine, Pfizer, injection Inject into the muscle. 09/13/21   Luiz Channel, MD  cyanocobalamin  (,VITAMIN B-12,) 1000 MCG/ML injection Inject 1 mL into the muscle every 30 (thirty) days.    [provider]  cyclobenzaprine  (FLEXERIL ) 10 MG tablet Take 1 tablet (10 mg total) by mouth 2 (two) times daily as needed for muscle spasms. 09/18/23   Nivia Colon, PA-C  docusate sodium  (COLACE) 100 MG capsule Take 100 mg by mouth daily.    [provider]  DULoxetine  (CYMBALTA ) 60 MG capsule Take 60 mg by mouth daily.  03/10/19   [provider]  dupilumab  (DUPIXENT ) 300 MG/2ML prefilled syringe Inject 300 mg into the skin every 14 (fourteen) days. 10/23/23   Meade Verdon RAMAN, MD  ferrous sulfate  325 (65 FE) MG tablet Take 1 tablet (325 mg total) by mouth 2 (two) times daily with a meal. 12/15/16   Ezzard Staci SAILOR, NP  fluticasone  (FLONASE ) 50 MCG/ACT  nasal spray Place 2 sprays into both nostrils daily. 12/19/21   Desai, Nikita S, MD  fluticasone  (FLONASE ) 50 MCG/ACT nasal spray Place 2 sprays into both nostrils daily. 09/03/22   Marinda Rocky SAILOR, MD  folic acid  (FOLVITE ) 1 MG tablet Take 1 mg by mouth daily.  04/07/19   [provider]  glycopyrrolate  (ROBINUL ) 2 MG tablet Take 1 tablet (2 mg total) by mouth 2 (two) times daily. 01/18/20   Esterwood, Amy S, PA-C  hydrocortisone  2.5 % ointment Apply topically twice daily as need to red sandpapery rash. 08/15/22   Marinda Rocky SAILOR, MD  hydrOXYzine  (VISTARIL )  50 MG capsule Take 50 mg by mouth every 8 (eight) hours as needed for anxiety or itching. 09/20/20   [provider]  ipratropium (ATROVENT ) 0.03 % nasal spray Place 2 sprays into both nostrils 3 (three) times daily as needed for rhinitis. 08/15/22   Marinda Rocky SAILOR, MD  iron  dextran complex 2,000 mg in sodium chloride  0.9 % 1,000 mL Inject 2,000 mg into the vein once. Monthly for 6 months    [provider]  Lidocaine  1.8 % PTCH Apply topically.    [provider]  midodrine  (PROAMATINE ) 5 MG tablet Take 1 tablet (5 mg total) by mouth 3 (three) times daily with meals. 06/14/20   Army Katheryn BROCKS, NP  montelukast  (SINGULAIR ) 10 MG tablet Take 1 tablet (10 mg total) by mouth at bedtime. 04/16/23   Meade Verdon RAMAN, MD  Olopatadine-Mometasone  (RYALTRIS ) [phone] MCG/ACT SUSP Place 2 sprays into the nose 2 (two) times daily as needed. 08/15/22   Marinda Rocky SAILOR, MD  omeprazole  (PRILOSEC) 40 MG capsule TAKE 1 CAPSULE(40 MG) BY MOUTH DAILY 12/25/20   Esterwood, Amy S, PA-C  pantoprazole  (PROTONIX ) 40 MG tablet Take 1 tablet (40 mg total) by mouth daily. 01/11/20   Gretta Leita SQUIBB, DO  pregabalin  (LYRICA ) 200 MG capsule Take 200 mg by mouth 2 (two) times daily. 09/11/20   [provider]  solifenacin  (VESICARE ) 10 MG tablet Take 1 tablet (10 mg total) by mouth daily. 06/08/24   Matilda Senior, MD  valACYclovir  (VALTREX ) 1000 MG tablet Take 1,000 mg by mouth 2 (two) times daily.    [provider]  Vitamin D , Ergocalciferol , (DRISDOL ) 1.25 MG (50000 UT) CAPS capsule Take 50,000 Units by mouth every Monday.  07/25/19   [provider]    Allergies: Tylenol  [acetaminophen ], Adhesive  [tape], Hydrocodone-acetaminophen , Nsaids, and Latex    Review of Systems  Constitutional:  Negative for chills and fever.  HENT:  Negative for ear pain and sore throat.   Eyes:  Negative for pain and visual disturbance.  Respiratory:  Negative for cough and shortness of breath.    Cardiovascular:  Negative for chest pain and palpitations.  Gastrointestinal:  Negative for abdominal pain and vomiting.  Genitourinary:  Negative for dysuria and hematuria.  Musculoskeletal:  Negative for arthralgias and back pain.  Skin:  Negative for color change and rash.  Neurological:  Negative for seizures and syncope.  All other systems reviewed and are negative.   Updated Vital Signs BP 122/76 (BP Location: Left Arm)   Pulse (!) 56   Temp 97.9 F (36.6 C) (Oral)   Resp 18   Ht 5' 2 (1.575 m)   Wt 50.3 kg   LMP 10/07/2016   SpO2 100%   BMI 20.30 kg/m   Physical Exam Vitals and nursing note reviewed.  Constitutional:      General: She is not  in acute distress.    Appearance: She is well-developed.  HENT:     Head: Normocephalic and atraumatic.  Eyes:     Conjunctiva/sclera: Conjunctivae normal.  Cardiovascular:     Rate and Rhythm: Normal rate and regular rhythm.     Heart sounds: No murmur heard. Pulmonary:     Effort: Pulmonary effort is normal. No respiratory distress.     Breath sounds: Normal breath sounds.  Abdominal:     Palpations: Abdomen is soft.     Tenderness: There is no abdominal tenderness.  Musculoskeletal:        General: No swelling.     Cervical back: Neck supple.       Legs:  Skin:    General: Skin is warm and dry.     Capillary Refill: Capillary refill takes less than 2 seconds.  Neurological:     Mental Status: She is alert.  Psychiatric:        Mood and Affect: Mood normal.      Media Information  Document Information  Photos    08/18/2024 17:47  Attached To:  Hospital Encounter on 08/18/24  Source Information  Simon Lavonia SAILOR, MD  [facility]     (all labs ordered are listed, but only abnormal results are displayed) Labs Reviewed - No data to display  EKG: None  Radiology: No results found.   Procedures   Medications Ordered in the ED - No data to display  Clinical Course as of 08/18/24 1928   Thu Aug 18, 2024  1912 Dr. Blanch  [TL]    Clinical Course User Index [TL] Simon Lavonia SAILOR, MD                                 Medical Decision Making    Patient presents because of wound check.  Patient had procedure done by plastic surgery at [facility] today.  Had scar revision completed.  Patient noticed that she had some spotting from incision site on left inner leg whenever she got home.  Felt like it was worse whenever she was up.  No swelling to the area.  Otherwise, denies all complaints.  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 She presents for preoperative evaluation for upcoming procedure, revision of scar dog ear thigh (Left), seroma excision lower extremity (Right), scheduled for 08/18/2024 with Dr. Blair Dupont.    Upon exam, patient no acute distress.  He dynamically stable.   The incision site to the right thigh is completely intact.  No discharge or erythema.  The left side, it is also mostly intact.  There is 1 very small area or a small amount of superficial tissues exposed.  Looks like the through buried sutures.  There is no seroma.  No abscess.  No ongoing bleeding.  She only had very small spotting from this area.  I did apply some Dermabond to the area but otherwise no acute intervention needed.  Did not bleeding further.  Once again, no concern for seroma or other fluid collection.  Spoke to plastic surgery at [facility].  They agree with the plan.  Patient to be discharged and follow-up with them.  Recommended pressure for the patient if she has this again.       Final diagnoses:  Encounter for examination of  surgical site    ED Discharge Orders     None          Simon Lavonia SAILOR, MD 08/18/24 GENNIE

## 2024-08-18 NOTE — Interval H&P Note (Signed)
 Patient seen and examined- we went over the plan.  Risks have previously been discussed extensively.

## 2024-08-18 NOTE — Telephone Encounter (Signed)
 Attending: Merita  Call returned. Two identifiers used. EMR reviewed.  S: incision on thigh opening  B: DATE OF PROCEDURE: Thu 08/18/2024   PREOPERATIVE DIAGNOSIS: Left and right thigh scars s/p thigh plasty    POSTOPERATIVE DIAGNOSIS: Same   PROCEDURE: Scar revision of left thigh (10cm) and right thigh (5cm)   A: left thigh incision bleeding ever after applying pressure.    R: to the ED for evaluation.  MD notified  Call for concerns. Verbalizes understanding and agrees with plan.

## 2024-09-14 ENCOUNTER — Ambulatory Visit: Admitting: Urology

## 2024-12-09 ENCOUNTER — Telehealth: Payer: Self-pay

## 2024-12-09 NOTE — Telephone Encounter (Signed)
 Auth Submission: NO AUTH NEEDED Site of care: Site of care: [facility] Payer: Medicare A/B Medication & CPT/J Code(s) submitted: Dupixent  Diagnosis Code:  Route of submission (phone, fax, portal):  Phone # Fax # Auth type: [facility] Units/visits requested: 300mg  x 26 doses Reference number:  Approval from: 12/09/24 to 01/07/26

## 2024-12-27 ENCOUNTER — Other Ambulatory Visit (HOSPITAL_COMMUNITY): Payer: Self-pay

## 2024-12-27 ENCOUNTER — Telehealth: Payer: Self-pay | Admitting: Pharmacy Technician

## 2024-12-27 ENCOUNTER — Encounter: Payer: Self-pay | Admitting: Internal Medicine

## 2024-12-27 ENCOUNTER — Telehealth: Payer: Self-pay

## 2024-12-27 NOTE — Telephone Encounter (Signed)
 Submitted a Prior Authorization request to [facility] for DUPIXENT  via CoverMyMeds. Will update once we receive a response.  Key: [id number]

## 2024-12-27 NOTE — Telephone Encounter (Signed)
 error

## 2024-12-30 NOTE — Telephone Encounter (Signed)
 Received notification from [facility] regarding a prior authorization for DUPIXENT . Authorization has been APPROVED from 12/13/2024 to 12/07/2025.  Authorization # [id number]  Sherry Pennant, PharmD, MPH, BCPS, CPP Clinical Pharmacist
